# Patient Record
Sex: Male | Born: 1955 | Race: White | Hispanic: No | Marital: Married | Smoking: Never smoker
Health system: Southern US, Community
[De-identification: ages and names within clinical notes are randomized; demographics above are authoritative.]

## PROBLEM LIST (undated history)

## (undated) DIAGNOSIS — I10 Essential (primary) hypertension: Secondary | ICD-10-CM

## (undated) DIAGNOSIS — E785 Hyperlipidemia, unspecified: Secondary | ICD-10-CM

## (undated) DIAGNOSIS — I5032 Chronic diastolic (congestive) heart failure: Secondary | ICD-10-CM

## (undated) DIAGNOSIS — C787 Secondary malignant neoplasm of liver and intrahepatic bile duct: Secondary | ICD-10-CM

## (undated) DIAGNOSIS — C801 Malignant (primary) neoplasm, unspecified: Secondary | ICD-10-CM

## (undated) DIAGNOSIS — E119 Type 2 diabetes mellitus without complications: Secondary | ICD-10-CM

## (undated) HISTORY — DX: Hyperlipidemia, unspecified: E78.5

---

## 2013-06-16 ENCOUNTER — Encounter (HOSPITAL_COMMUNITY): Payer: Self-pay

## 2013-06-16 ENCOUNTER — Inpatient Hospital Stay (HOSPITAL_COMMUNITY)
Admission: EM | Admit: 2013-06-16 | Discharge: 2013-06-22 | DRG: 579 | Disposition: A | Discharge status: Home / Self Care | Payer: 59 | Attending: Internal Medicine | Admitting: Internal Medicine

## 2013-06-16 ENCOUNTER — Emergency Department (HOSPITAL_COMMUNITY): Payer: 59

## 2013-06-16 DIAGNOSIS — A4901 Methicillin susceptible Staphylococcus aureus infection, unspecified site: Secondary | ICD-10-CM | POA: Diagnosis present

## 2013-06-16 DIAGNOSIS — N390 Urinary tract infection, site not specified: Secondary | ICD-10-CM | POA: Diagnosis present

## 2013-06-16 DIAGNOSIS — L03116 Cellulitis of left lower limb: Secondary | ICD-10-CM

## 2013-06-16 DIAGNOSIS — I10 Essential (primary) hypertension: Secondary | ICD-10-CM

## 2013-06-16 DIAGNOSIS — M629 Disorder of muscle, unspecified: Secondary | ICD-10-CM | POA: Diagnosis present

## 2013-06-16 DIAGNOSIS — E871 Hypo-osmolality and hyponatremia: Secondary | ICD-10-CM | POA: Diagnosis present

## 2013-06-16 DIAGNOSIS — E1110 Type 2 diabetes mellitus with ketoacidosis without coma: Secondary | ICD-10-CM

## 2013-06-16 DIAGNOSIS — E1142 Type 2 diabetes mellitus with diabetic polyneuropathy: Secondary | ICD-10-CM | POA: Diagnosis present

## 2013-06-16 DIAGNOSIS — M2420 Disorder of ligament, unspecified site: Secondary | ICD-10-CM | POA: Diagnosis present

## 2013-06-16 DIAGNOSIS — E119 Type 2 diabetes mellitus without complications: Secondary | ICD-10-CM | POA: Diagnosis present

## 2013-06-16 DIAGNOSIS — L03119 Cellulitis of unspecified part of limb: Principal | ICD-10-CM | POA: Diagnosis present

## 2013-06-16 DIAGNOSIS — L02619 Cutaneous abscess of unspecified foot: Principal | ICD-10-CM | POA: Diagnosis present

## 2013-06-16 DIAGNOSIS — E1310 Other specified diabetes mellitus with ketoacidosis without coma: Secondary | ICD-10-CM | POA: Diagnosis present

## 2013-06-16 DIAGNOSIS — M65979 Unspecified synovitis and tenosynovitis, unspecified ankle and foot: Secondary | ICD-10-CM | POA: Diagnosis present

## 2013-06-16 DIAGNOSIS — E1149 Type 2 diabetes mellitus with other diabetic neurological complication: Secondary | ICD-10-CM | POA: Diagnosis present

## 2013-06-16 DIAGNOSIS — M659 Synovitis and tenosynovitis, unspecified: Secondary | ICD-10-CM | POA: Diagnosis present

## 2013-06-16 DIAGNOSIS — E876 Hypokalemia: Secondary | ICD-10-CM | POA: Diagnosis not present

## 2013-06-16 HISTORY — DX: Type 2 diabetes mellitus without complications: E11.9

## 2013-06-16 LAB — COMPREHENSIVE METABOLIC PANEL
ALT: 12 U/L (ref 0–53)
AST: 22 U/L (ref 0–37)
Albumin: 2.9 g/dL — ABNORMAL LOW (ref 3.5–5.2)
CO2: 22 mEq/L (ref 19–32)
Chloride: 92 mEq/L — ABNORMAL LOW (ref 96–112)
Creatinine, Ser: 0.75 mg/dL (ref 0.50–1.35)
GFR calc non Af Amer: >90 mL/min (ref >90)
Sodium: 130 mEq/L — ABNORMAL LOW (ref 135–145)
Total Bilirubin: 1.3 mg/dL — ABNORMAL HIGH (ref 0.3–1.2)

## 2013-06-16 LAB — CBC WITH DIFFERENTIAL/PLATELET
Basophils Absolute: 0 10*3/uL (ref 0.0–0.1)
Basophils Relative: 0 % (ref 0–1)
Lymphocytes Relative: 2 % — ABNORMAL LOW (ref 12–46)
MCHC: 34.7 g/dL (ref 30.0–36.0)
Monocytes Absolute: 1.7 10*3/uL — ABNORMAL HIGH (ref 0.1–1.0)
Neutro Abs: 17.8 10*3/uL — ABNORMAL HIGH (ref 1.7–7.7)
Neutrophils Relative %: 90 % — ABNORMAL HIGH (ref 43–77)
Platelets: 275 10*3/uL (ref 150–400)
RDW: 12.6 % (ref 11.5–15.5)
WBC: 19.8 10*3/uL — ABNORMAL HIGH (ref 4.0–10.5)

## 2013-06-16 LAB — BASIC METABOLIC PANEL
CO2: 19 mEq/L (ref 19–32)
Calcium: 8.9 mg/dL (ref 8.4–10.5)
Glucose, Bld: 263 mg/dL — ABNORMAL HIGH (ref 70–99)
Sodium: 131 mEq/L — ABNORMAL LOW (ref 135–145)

## 2013-06-16 MED ORDER — SODIUM CHLORIDE 0.9 % IV SOLN
INTRAVENOUS | Status: DC
  Administered 2013-06-16: 2 [IU]/h via INTRAVENOUS
  Filled 2013-06-16: qty 1

## 2013-06-16 MED ORDER — SODIUM CHLORIDE 0.9 % IV BOLUS (SEPSIS)
1000.0000 mL | Freq: Once | INTRAVENOUS | Status: AC
  Administered 2013-06-16: 1000 mL via INTRAVENOUS

## 2013-06-16 MED ORDER — CLINDAMYCIN PHOSPHATE 600 MG/50ML IV SOLN
600.0000 mg | Freq: Once | INTRAVENOUS | Status: AC
  Administered 2013-06-16: 600 mg via INTRAVENOUS
  Filled 2013-06-16: qty 50

## 2013-06-16 MED ORDER — SODIUM CHLORIDE 0.9 % IV SOLN
INTRAVENOUS | Status: AC
  Administered 2013-06-16: 22:00:00 via INTRAVENOUS

## 2013-06-16 MED ORDER — VANCOMYCIN HCL 10 G IV SOLR
2500.0000 mg | Freq: Once | INTRAVENOUS | Status: AC
  Administered 2013-06-16: 2500 mg via INTRAVENOUS
  Filled 2013-06-16: qty 2500

## 2013-06-16 MED ORDER — VANCOMYCIN HCL 10 G IV SOLR
1250.0000 mg | Freq: Two times a day (BID) | INTRAVENOUS | Status: DC
  Administered 2013-06-17 – 2013-06-18 (×4): 1250 mg via INTRAVENOUS
  Filled 2013-06-16 (×6): qty 1250

## 2013-06-16 NOTE — H&P (Signed)
[facility] History and Physical  Jaime [name] ZOX:[id number] DOB: [birth year] DOA: 06/16/2013  Referring physician: Dr. Lynelle Doctor PCP: No PCP Per Patient  Specialists: none  Chief Complaint: left foot pain  HPI: Jaime [name] is a 57 y.o. male  With no significant past medical history that comes in for redness and pain of his right foot started 2 days prior to admission. Patient relates he was working 4 days prior to admission felt into a ditch and  twisted his ankle. Pt states that he was wearing a shoe on Saturday, during a 12-hour shift, when swelling onset.  Pt states that he is able to ambulate well. Pt states that resting, elevating the leg and lying supine reduce his pain. Pt states that he has taken Aleve and Aspirin with minimal relief of pain and swelling. Pt states that he has had 2, 10-minute episodes of chills over the last few days.  In the ED: A CBC was done that showed a white count 19.8 with a left shift, c-met was done that showed blood glucose of 302 anion gap of 18 bicarbonate 2200 on 2.92 the rest and further evaluate.  Review of Systems: The patient denies anorexia, fever, weight loss,, vision loss, decreased hearing, hoarseness, chest pain, syncope, dyspnea on exertion, peripheral edema, balance deficits, hemoptysis, abdominal pain, melena, hematochezia, severe indigestion/heartburn, hematuria, incontinence, genital sores, muscle weakness, suspicious skin lesions, transient blindness, difficulty walking, depression, unusual weight change, abnormal bleeding, enlarged lymph nodes, angioedema, and breast masses.    Past Medical History  Diagnosis Date  . Diabetes mellitus without complication    History reviewed. No pertinent past surgical history. Social History:  reports that he has never smoked. He does not have any smokeless tobacco history on file. He reports that  drinks alcohol. He reports that he does not use illicit drugs.   No Known Allergies  Family  History  Problem Relation Age of Onset  . Heart failure Mother   . Heart attack Mother   . Leukemia Father     Prior to Admission medications   Medication Sig Start Date End Date Taking? Authorizing Provider  aspirin EC 81 MG tablet Take 81 mg by mouth daily.   Yes Historical Provider, MD  ibuprofen (ADVIL,MOTRIN) 200 MG tablet Take 400 mg by mouth every 6 (six) hours as needed for pain.   Yes Historical Provider, MD   Physical Exam: Filed Vitals:   06/16/13 1651 06/16/13 2006  BP: 190/88 135/70  Pulse: 118 103  Temp: 99.4 F (37.4 C) 99.7 F (37.6 C)  TempSrc: Oral Oral  Resp: 19 20  SpO2: 99% 98%    BP 135/70  Pulse 103  Temp(Src) 99.7 F (37.6 C) (Oral)  Resp 20  SpO2 98%  General Appearance:    Alert, cooperative, no distress, appears stated age  Head:    Normocephalic, without obvious abnormality, atraumatic           Throat:   Lips, mucosa, and tongue are dry.  Neck:   Supple, symmetrical, trachea midline, no adenopathy;       thyroid:  No enlargement/tenderness/nodules; no carotid   bruit or JVD  Back:     Symmetric, no curvature, ROM normal, no CVA tenderness  Lungs:     Clear to auscultation bilaterally, respirations unlabored  Chest wall:    No tenderness or deformity  Heart:    Regular rate and rhythm, S1 and S2 normal, no murmur, rub   or gallop  Abdomen:  Soft, non-tender, bowel sounds active all four quadrants,    no masses, no organomegaly        Extremities:   left lower extremity edema with erythema up to his ankle. He has an opening in the medial side in the plantar aspect with some observable fluid collection under the skin.   Pulses:   2+ and symmetric all extremities  Skin:   Skin color, texture, turgor normal, no rashes or lesions  Lymph nodes:   Cervical, supraclavicular, and axillary nodes normal  Neurologic:   CNII-XII intact. Normal strength, sensation and reflexes      throughout    Labs on Admission:  Basic Metabolic  Panel:  Recent Labs Lab 06/16/13 1713  NA 130*  K 3.7  CL 92*  CO2 22  GLUCOSE 302*  BUN 18  CREATININE 0.75  CALCIUM 9.6   Liver Function Tests:  Recent Labs Lab 06/16/13 1713  AST 22  ALT 12  ALKPHOS 136*  BILITOT 1.3*  PROT 7.7  ALBUMIN 2.9*   No results found for this basename: LIPASE, AMYLASE,  in the last 168 hours No results found for this basename: AMMONIA,  in the last 168 hours CBC:  Recent Labs Lab 06/16/13 1713  WBC 19.8*  NEUTROABS 17.8*  HGB 11.6*  HCT 33.4*  MCV 83.3  PLT 275   Cardiac Enzymes: No results found for this basename: CKTOTAL, CKMB, CKMBINDEX, TROPONINI,  in the last 168 hours  BNP (last 3 results) No results found for this basename: PROBNP,  in the last 8760 hours CBG: No results found for this basename: GLUCAP,  in the last 168 hours  Radiological Exams on Admission: Dg Foot Complete Left  06/16/2013   *RADIOLOGY REPORT*  Clinical Data: Left foot pain, swelling and redness following an injury 4 days ago.  LEFT FOOT - COMPLETE 3+ VIEW  Comparison: None.  Findings: Inferior and posterior calcaneal spur formation.  Dorsal tarsal spur formation.  Talotibial degenerative spur formation. Mild diffuse distal soft tissue swelling.  No fracture or dislocation.  IMPRESSION:  1.  No fracture. 2.  Degenerative changes, as described above.   Original Report Authenticated By: Beckie Salts, M.D.    EKG: Independently reviewed. none  Assessment/Plan Cellulitis and abscess of foot, except toes - Check blood cultures x2, we'll start him on vancomycin per pharmacy. We'll get a lactate and serum ketone. We'll check a CT scan of the left foot to rule out an abscess. Will elevate leg above heart.  Diabetes mellitus, type II/DKA, type 2 - He has a anion gap of 18, with a bicarbonate of 22. We'll go ahead and start him IV insulin.  Check CBGs q. hours and basic metabolic panel every 4 hours. Once his gap is closed can transition to subcutaneous insulin.  We'll check a serum ketone and lactic acid. Also given 2 L of normal saline as he seems to be intravascularly dry by physical exam. And we'll continue him on normal saline. We'll check strict I.'s and O.'s. We'll replete his potassium. -  Check potassium in the morning  Hyponatremia:  - This most likely secondary to elevated blood glucose we'll repeat in the morning was blood glucoses correct.  Code Status: FULL Family Communication: none Disposition Plan: inpatient 2-3 days  Time spent: 70 minutes  Marinda Elk [facility] Pager [phone]  If 7PM-7AM, please contact night-coverage [email or web address] Password [email or web address] 06/16/2013, 9:05 PM

## 2013-06-16 NOTE — ED Notes (Signed)
Pt c/o Left ankle pain and swelling,  reports he twisted his Left foot Friday night when he fell in a ditch, worked a 12 hour shift Saturday the next day. Redness and swelling noted to Left ankle

## 2013-06-16 NOTE — Progress Notes (Signed)
ANTIBIOTIC CONSULT NOTE - INITIAL  Pharmacy Consult for Vancomycin Indication: right foot cellulitis  No Known Allergies  Patient Measurements: Height: 6\' 4"  (193 cm) Weight: 280 lb (127.007 kg) IBW/kg (Calculated) : 86.8  Vital Signs: Temp: 99.7 F (37.6 C) (06/17 2006) Temp src: Oral (06/17 2006) BP: 135/70 mmHg (06/17 2006) Pulse Rate: 103 (06/17 2006) Intake/Output from previous day:   Intake/Output from this shift:    Labs:  Recent Labs  06/16/13 1713  WBC 19.8*  HGB 11.6*  PLT 275  CREATININE 0.75   Estimated Creatinine Clearance: 146.5 ml/min (by C-G formula based on Cr of 0.75). No results found for this basename: VANCOTROUGH, VANCOPEAK, VANCORANDOM, GENTTROUGH, GENTPEAK, GENTRANDOM, TOBRATROUGH, TOBRAPEAK, TOBRARND, AMIKACINPEAK, AMIKACINTROU, AMIKACIN,  in the last 72 hours   Microbiology: No results found for this or any previous visit (from the past 720 hour(s)).  Medical History: Past Medical History  Diagnosis Date  . Diabetes mellitus without complication     Medications:   (Not in a hospital admission) Assessment: 57 y/o male patient admitted with DKA, found to have right foot cellulitis requiring vancomycin. Received clindamycin in ED. Will adjust abx for obesity.  Goal of Therapy:  Vancomycin trough level 10-15 mcg/ml  Plan:  Vancomycin 2.5g x1 then 1250mg  IV q12h and monitor renal function. Measure antibiotic drug levels at steady [address] PharmD, [place] Pager [phone] 06/16/2013,9:47 PM

## 2013-06-16 NOTE — ED Provider Notes (Signed)
History  This chart was scribed for Jaime Dredge, PA-C, working with Glynn Octave, MD by Ardelia Mems, ED Scribe. This patient was seen in room TR11C/TR11C and the patient's care was started at 5:01 PM.   CSN: [id number]  Arrival date & time 06/16/13  1648     Chief Complaint  Patient presents with  . Ankle Injury    Patient is a 57 y.o. male presenting with lower extremity injury. The history is provided by the patient. No language interpreter was used.  Ankle Injury Pertinent negatives include no chest pain, no abdominal pain and no shortness of breath.  Ankle Injury Associated symptoms include chills. Pertinent negatives include no abdominal pain, chest pain, congestion, coughing, fever, myalgias, nausea, numbness, sore throat, vomiting or weakness.    HPI Comments: Jaime [name] is a 57 y.o. male who presents to the Emergency Department complaining of constant, moderate, "5/10" left foot pain with associated swelling and redness. Pt was seen at [facility] Sunday and was sent here. Pt has a h/o "borderline" diabetes. Pt states that he was working in his yard 4 days ago, fell into a ditch, and believes he twisted his left ankle. Pt denies breaching his skin. Pt states that he was wearing a shoe on Saturday, during a 12-hour shift,  when swelling onset. Pt states that his left ankle has always been bigger, which he attributes to ligament stress in his youth. Pt states that he is able to ambulate well. Pt states that resting, elevating the leg and lying supine reduce his pain. Pt states that he has taken Aleve and Aspirin with minimal relief of pain and swelling. Pt states that he has had 2, 10-minute episodes of chills over the last few days. Pt also states that he removed a tick from his left lower leg about 2 days ago. Pt states that the tick was not embedded. Pt denies right ankle pain/swelling, weakness, nausea, fever, vomiting, body aches, chest pain, SOB, cough,, sore throat, congestion,  abdominal pain or any other symptoms.  PCP- none  Past Medical History  Diagnosis Date  . Diabetes mellitus without complication     History reviewed. No pertinent past surgical history.  History reviewed. No pertinent family history.  History  Substance Use Topics  . Smoking status: Never Smoker   . Smokeless tobacco: Not on file  . Alcohol Use: Yes     Comment: social drinker      Review of Systems  Constitutional: Positive for chills. Negative for fever, activity change and appetite change.  HENT: Negative for congestion and sore throat.   Respiratory: Negative for cough and shortness of breath.   Cardiovascular: Negative for chest pain.  Gastrointestinal: Negative for nausea, vomiting and abdominal pain.  Musculoskeletal: Negative for myalgias.       Left ankle- pain, swelling and redness  Skin: Positive for color change. Negative for wound.  Neurological: Negative for weakness and numbness.  All other systems reviewed and are negative.   As per HPI  Allergies  Review of patient's allergies indicates no known allergies.  Home Medications  No current outpatient prescriptions on file.  Triage Vitals: BP 190/88  Pulse 118  Temp(Src) 99.4 F (37.4 C) (Oral)  Resp 19  SpO2 99%  Physical Exam  Nursing note and vitals reviewed. Constitutional: He appears well-developed and well-nourished. No distress.  HENT:  Head: Normocephalic and atraumatic.  Neck: Neck supple.  Cardiovascular: Normal rate and regular rhythm.   Pulmonary/Chest: Effort normal and breath sounds normal.  No respiratory distress. He has no wheezes. He has no rales.  Musculoskeletal:  Left foot with large areas of erythema over dorsal and plantar aspect and bullous appearance to plantar aspect of left foot.  Pitting edema of foot and lower leg, extending to to level of the knee.    Neurological: He is alert.  Skin: He is not diaphoretic.    ED Course  Procedures (including critical care  time)  DIAGNOSTIC STUDIES: Oxygen Saturation is 99% on RA, normal by my interpretation.    COORDINATION OF CARE: 5:17 PM- Pt advised of plan for treatment and pt agrees.   Medications  sodium chloride 0.9 % bolus 1,000 mL (not administered)  clindamycin (CLEOCIN) IVPB 600 mg (0 mg Intravenous Stopped 06/16/13 1905)     Labs Reviewed  CBC WITH DIFFERENTIAL - Abnormal; Notable for the following:    WBC 19.8 (*)    RBC 4.01 (*)    Hemoglobin 11.6 (*)    HCT 33.4 (*)    Neutrophils Relative % 90 (*)    Neutro Abs 17.8 (*)    Lymphocytes Relative 2 (*)    Lymphs Abs 0.4 (*)    Monocytes Absolute 1.7 (*)    All other components within normal limits  COMPREHENSIVE METABOLIC PANEL - Abnormal; Notable for the following:    Sodium 130 (*)    Chloride 92 (*)    Glucose, Bld 302 (*)    Albumin 2.9 (*)    Alkaline Phosphatase 136 (*)    Total Bilirubin 1.3 (*)    All other components within normal limits   Dg Foot Complete Left  06/16/2013   *RADIOLOGY REPORT*  Clinical Data: Left foot pain, swelling and redness following an injury 4 days ago.  LEFT FOOT - COMPLETE 3+ VIEW  Comparison: None.  Findings: Inferior and posterior calcaneal spur formation.  Dorsal tarsal spur formation.  Talotibial degenerative spur formation. Mild diffuse distal soft tissue swelling.  No fracture or dislocation.  IMPRESSION:  1.  No fracture. 2.  Degenerative changes, as described above.   Original Report Authenticated By: Beckie Salts, M.D.   Anion gap is 16.    1. Cellulitis of left foot   2. Diabetes mellitus    MDM  Pt with no PCP and previously thought to be "borderline" diabetes p/w blood glucose >300 with cellulitis of left foot.  Pt given clindamycin in ED.  Started on glucomander per my discussion with hospitalist.  Admitted [facility].       I personally performed the services described in this documentation, which was scribed in my presence. The recorded information has been reviewed and is  accurate.    Jaime Dredge, PA-C 06/16/13 2102

## 2013-06-16 NOTE — ED Notes (Signed)
No new changes from triage assessment 

## 2013-06-17 ENCOUNTER — Inpatient Hospital Stay (HOSPITAL_COMMUNITY): Payer: 59

## 2013-06-17 ENCOUNTER — Encounter (HOSPITAL_COMMUNITY): Payer: Self-pay | Admitting: *Deleted

## 2013-06-17 DIAGNOSIS — E876 Hypokalemia: Secondary | ICD-10-CM

## 2013-06-17 LAB — URINALYSIS, ROUTINE W REFLEX MICROSCOPIC
Glucose, UA: >1000 mg/dL — AB
Ketones, ur: 40 mg/dL — AB
Specific Gravity, Urine: 1.031 — ABNORMAL HIGH (ref 1.005–1.030)
pH: 5.5 (ref 5.0–8.0)

## 2013-06-17 LAB — PROTIME-INR
INR: 1.23 (ref 0.00–1.49)
Prothrombin Time: 15.3 seconds — ABNORMAL HIGH (ref 11.6–15.2)

## 2013-06-17 LAB — URINE MICROSCOPIC-ADD ON

## 2013-06-17 LAB — GLUCOSE, CAPILLARY
Glucose-Capillary: 183 mg/dL — ABNORMAL HIGH (ref 70–99)
Glucose-Capillary: 149 mg/dL — ABNORMAL HIGH (ref 70–99)
Glucose-Capillary: 170 mg/dL — ABNORMAL HIGH (ref 70–99)
Glucose-Capillary: 165 mg/dL — ABNORMAL HIGH (ref 70–99)
Glucose-Capillary: 117 mg/dL — ABNORMAL HIGH (ref 70–99)
Glucose-Capillary: 118 mg/dL — ABNORMAL HIGH (ref 70–99)
Glucose-Capillary: 94 mg/dL (ref 70–99)

## 2013-06-17 LAB — BASIC METABOLIC PANEL
BUN: 17 mg/dL (ref 6–23)
BUN: 17 mg/dL (ref 6–23)
CO2: 25 mEq/L (ref 19–32)
Chloride: 96 mEq/L (ref 96–112)
Chloride: 99 mEq/L (ref 96–112)
Creatinine, Ser: 0.65 mg/dL (ref 0.50–1.35)
GFR calc non Af Amer: >90 mL/min (ref >90)
Glucose, Bld: 246 mg/dL — ABNORMAL HIGH (ref 70–99)
Potassium: 3.4 mEq/L — ABNORMAL LOW (ref 3.5–5.1)

## 2013-06-17 LAB — COMPREHENSIVE METABOLIC PANEL
ALT: 8 U/L (ref 0–53)
Alkaline Phosphatase: 118 U/L — ABNORMAL HIGH (ref 39–117)
CO2: 25 mEq/L (ref 19–32)
Calcium: 8.9 mg/dL (ref 8.4–10.5)
Chloride: 97 mEq/L (ref 96–112)
GFR calc Af Amer: >90 mL/min (ref >90)
GFR calc non Af Amer: >90 mL/min (ref >90)
Glucose, Bld: 209 mg/dL — ABNORMAL HIGH (ref 70–99)
Potassium: 3.3 mEq/L — ABNORMAL LOW (ref 3.5–5.1)
Sodium: 132 mEq/L — ABNORMAL LOW (ref 135–145)
Total Bilirubin: 1 mg/dL (ref 0.3–1.2)

## 2013-06-17 LAB — CBC
MCHC: 35.1 g/dL (ref 30.0–36.0)
RDW: 12.6 % (ref 11.5–15.5)

## 2013-06-17 MED ORDER — DEXTROSE-NACL 5-0.45 % IV SOLN
INTRAVENOUS | Status: DC
  Administered 2013-06-17 (×2): via INTRAVENOUS

## 2013-06-17 MED ORDER — HYDROMORPHONE HCL PF 1 MG/ML IJ SOLN: 1.0000 mg | INTRAMUSCULAR | Status: DC | PRN

## 2013-06-17 MED ORDER — ACETAMINOPHEN 650 MG RE SUPP: 650.0000 mg | Freq: Four times a day (QID) | RECTAL | Status: DC | PRN

## 2013-06-17 MED ORDER — POLYETHYLENE GLYCOL 3350 17 G PO PACK
17.0000 g | PACK | Freq: Every day | ORAL | Status: DC | PRN
  Filled 2013-06-17: qty 1

## 2013-06-17 MED ORDER — LIVING WELL WITH DIABETES BOOK
Freq: Once | Status: AC
  Administered 2013-06-17: 13:00:00
  Filled 2013-06-17: qty 1

## 2013-06-17 MED ORDER — INSULIN DETEMIR 100 UNIT/ML ~~LOC~~ SOLN
30.0000 [IU] | Freq: Every day | SUBCUTANEOUS | Status: DC
  Administered 2013-06-17 – 2013-06-22 (×6): 30 [IU] via SUBCUTANEOUS
  Filled 2013-06-17 (×9): qty 0.3

## 2013-06-17 MED ORDER — ENOXAPARIN SODIUM 60 MG/0.6ML ~~LOC~~ SOLN
60.0000 mg | SUBCUTANEOUS | Status: DC
  Administered 2013-06-17 – 2013-06-21 (×4): 60 mg via SUBCUTANEOUS
  Filled 2013-06-17 (×6): qty 0.6

## 2013-06-17 MED ORDER — POTASSIUM CHLORIDE 10 MEQ/100ML IV SOLN
10.0000 meq | INTRAVENOUS | Status: AC
  Administered 2013-06-17 (×2): 10 meq via INTRAVENOUS
  Filled 2013-06-17 (×2): qty 100

## 2013-06-17 MED ORDER — ONDANSETRON HCL 4 MG PO TABS: 4.0000 mg | ORAL_TABLET | Freq: Four times a day (QID) | ORAL | Status: DC | PRN

## 2013-06-17 MED ORDER — ACETAMINOPHEN 325 MG PO TABS: 650.0000 mg | ORAL_TABLET | Freq: Four times a day (QID) | ORAL | Status: DC | PRN

## 2013-06-17 MED ORDER — INSULIN ASPART 100 UNIT/ML ~~LOC~~ SOLN
6.0000 [IU] | Freq: Three times a day (TID) | SUBCUTANEOUS | Status: DC
  Administered 2013-06-17: 6 [IU] via SUBCUTANEOUS

## 2013-06-17 MED ORDER — PNEUMOCOCCAL VAC POLYVALENT 25 MCG/0.5ML IJ INJ
0.5000 mL | INJECTION | INTRAMUSCULAR | Status: AC
  Filled 2013-06-17: qty 0.5

## 2013-06-17 MED ORDER — ONDANSETRON HCL 4 MG/2ML IJ SOLN: 4.0000 mg | Freq: Three times a day (TID) | INTRAMUSCULAR | Status: DC | PRN

## 2013-06-17 MED ORDER — PIPERACILLIN-TAZOBACTAM 3.375 G IVPB
3.3750 g | Freq: Three times a day (TID) | INTRAVENOUS | Status: DC
  Administered 2013-06-17 – 2013-06-21 (×13): 3.375 g via INTRAVENOUS
  Filled 2013-06-17 (×14): qty 50

## 2013-06-17 MED ORDER — HEPARIN SODIUM (PORCINE) 5000 UNIT/ML IJ SOLN: 5000.0000 [IU] | Freq: Three times a day (TID) | INTRAMUSCULAR | Status: DC

## 2013-06-17 MED ORDER — SODIUM CHLORIDE 0.9 % IV SOLN: INTRAVENOUS | Status: DC

## 2013-06-17 MED ORDER — INSULIN REGULAR HUMAN 100 UNIT/ML IJ SOLN
INTRAMUSCULAR | Status: DC
  Administered 2013-06-17: 4.9 [IU]/h via INTRAVENOUS

## 2013-06-17 MED ORDER — INSULIN ASPART 100 UNIT/ML ~~LOC~~ SOLN
10.0000 [IU] | Freq: Three times a day (TID) | SUBCUTANEOUS | Status: DC
  Administered 2013-06-17: 10 [IU] via SUBCUTANEOUS

## 2013-06-17 MED ORDER — SODIUM CHLORIDE 0.9 % IV SOLN
INTRAVENOUS | Status: DC
  Administered 2013-06-17: via INTRAVENOUS

## 2013-06-17 MED ORDER — GADOBENATE DIMEGLUMINE 529 MG/ML IV SOLN
20.0000 mL | Freq: Once | INTRAVENOUS | Status: AC
  Administered 2013-06-17: 20 mL via INTRAVENOUS

## 2013-06-17 MED ORDER — HEPARIN SODIUM (PORCINE) 5000 UNIT/ML IJ SOLN
5000.0000 [IU] | Freq: Three times a day (TID) | INTRAMUSCULAR | Status: DC
  Filled 2013-06-17 (×3): qty 1

## 2013-06-17 MED ORDER — ONDANSETRON HCL 4 MG/2ML IJ SOLN: 4.0000 mg | Freq: Four times a day (QID) | INTRAMUSCULAR | Status: DC | PRN

## 2013-06-17 MED ORDER — INSULIN ASPART 100 UNIT/ML ~~LOC~~ SOLN
0.0000 [IU] | Freq: Three times a day (TID) | SUBCUTANEOUS | Status: DC
  Administered 2013-06-18: 3 [IU] via SUBCUTANEOUS
  Administered 2013-06-18 (×2): 2 [IU] via SUBCUTANEOUS
  Administered 2013-06-19: 1 [IU] via SUBCUTANEOUS
  Administered 2013-06-19 – 2013-06-20 (×2): 2 [IU] via SUBCUTANEOUS
  Administered 2013-06-20: 1 [IU] via SUBCUTANEOUS
  Administered 2013-06-20 – 2013-06-21 (×2): 2 [IU] via SUBCUTANEOUS
  Administered 2013-06-21: 1 [IU] via SUBCUTANEOUS
  Administered 2013-06-21 – 2013-06-22 (×2): 2 [IU] via SUBCUTANEOUS
  Administered 2013-06-22: 13:00:00 via SUBCUTANEOUS

## 2013-06-17 MED ORDER — POTASSIUM CHLORIDE CRYS ER 20 MEQ PO TBCR
40.0000 meq | EXTENDED_RELEASE_TABLET | Freq: Once | ORAL | Status: AC
  Administered 2013-06-17: 40 meq via ORAL
  Filled 2013-06-17: qty 2

## 2013-06-17 MED ORDER — ASPIRIN EC 81 MG PO TBEC
81.0000 mg | DELAYED_RELEASE_TABLET | Freq: Every day | ORAL | Status: DC
  Administered 2013-06-17 – 2013-06-22 (×5): 81 mg via ORAL
  Filled 2013-06-17 (×6): qty 1

## 2013-06-17 MED ORDER — DEXTROSE 50 % IV SOLN: 25.0000 mL | INTRAVENOUS | Status: DC | PRN

## 2013-06-17 NOTE — Progress Notes (Signed)
PATIENT DETAILS Name: Jaime [name] Age: 57 y.o. Sex: male Date of Birth: [birth year] Admit Date: 06/16/2013 Admitting Physician Marinda Elk, MD PCP:No PCP Per Patient  Subjective: No major issues overnight-admitted with DKA and significant left foot cellultis/swelling  Assessment/Plan: Principal Problem:   Cellulitis of Left foot -suspect deep tissue infection-therefore will add Zosyn -check MRI  -have consulted Dr Lajoyce Corners for possible I&D  DKA -resolved -given 30 units of Levemir and then stop Insulin gtt -Will also place on 10 units of Novolog with meals along with SSI -await A1C  Hypokalemia -2/2 to DKA/Insulin gtt -replete and recheck in am  Disposition: Remain inpatient  DVT Prophylaxis: Prophylactic Lovenox   Code Status: Full code   Family Communication None  Procedures:  None  CONSULTS:  orthopedic surgery   MEDICATIONS: Scheduled Meds: . aspirin EC  81 mg Oral Daily  . heparin  5,000 Units Subcutaneous Q8H  . insulin aspart  0-9 Units Subcutaneous TID WC  . insulin aspart  10 Units Subcutaneous TID WC  . insulin detemir  30 Units Subcutaneous Daily  . piperacillin-tazobactam (ZOSYN)  IV  3.375 g Intravenous Q8H  . [START ON 06/18/2013] pneumococcal 23 valent vaccine  0.5 mL Intramuscular Tomorrow-1000  . potassium chloride  10 mEq Intravenous Q1 Hr x 2  . vancomycin  1,250 mg Intravenous Q12H   Continuous Infusions: . sodium chloride 125 mL/hr at 06/17/13 0023  . sodium chloride    . dextrose 5 % and 0.45% NaCl 100 mL/hr at 06/17/13 0225  . insulin (NOVOLIN-R) infusion     PRN Meds:.acetaminophen, acetaminophen, dextrose, ondansetron (ZOFRAN) IV, ondansetron, polyethylene glycol  Antibiotics: Anti-infectives   Start     Dose/Rate Route Frequency Ordered Stop   06/17/13 1000  vancomycin (VANCOCIN) 1,250 mg in sodium chloride 0.9 % 250 mL IVPB     1,250 mg 166.7 mL/hr over 90 Minutes Intravenous Every 12 hours 06/16/13 2152      06/17/13 0900  piperacillin-tazobactam (ZOSYN) IVPB 3.375 g     3.375 g 12.5 mL/hr over 240 Minutes Intravenous Every 8 hours 06/17/13 0820     06/16/13 2300  vancomycin (VANCOCIN) 2,500 mg in sodium chloride 0.9 % 500 mL IVPB     2,500 mg 250 mL/hr over 120 Minutes Intravenous  Once 06/16/13 2152 06/17/13 0128   06/16/13 1815  clindamycin (CLEOCIN) IVPB 600 mg     600 mg 100 mL/hr over 30 Minutes Intravenous  Once 06/16/13 1802 06/16/13 1905       PHYSICAL EXAM: Vital signs in last 24 hours: Filed Vitals:   06/16/13 2136 06/16/13 2330 06/17/13 0401 06/17/13 0913  BP:  143/71 123/70 137/76  Pulse:  100 88 97  Temp:  99.6 F (37.6 C) 100.2 F (37.9 C) 99.5 F (37.5 C)  TempSrc:  Oral Oral Oral  Resp:  18 20 20   Height: 6\' 4"  (1.93 m)     Weight: 127.007 kg (280 lb)  127 kg (279 lb 15.8 oz)   SpO2:  100% 96% 95%    Weight change:  Filed Weights   06/16/13 2136 06/17/13 0401  Weight: 127.007 kg (280 lb) 127 kg (279 lb 15.8 oz)   Body mass index is 34.09 kg/(m^2).   Gen Exam: Awake and alert with clear speech.   Neck: Supple, No JVD.   Chest: B/L Clear.   CVS: S1 S2 Regular, no murmurs.  Abdomen: soft, BS +, non tender, non distended.  Extremities: Significantly swollen left foot-with dorsal erythema and some  blister/discoloration in the plantar aspect as well. Left lower leg also swollen. Neurologic: Non Focal.   Skin: No Rash.   Wounds: N/A.   Intake/Output from previous day:  Intake/Output Summary (Last 24 hours) at 06/17/13 1041 Last data filed at 06/17/13 0900  Gross per 24 hour  Intake    650 ml  Output    200 ml  Net    450 ml     LAB RESULTS: CBC  Recent Labs Lab 06/16/13 1713 06/17/13 0230  WBC 19.8* 15.2*  HGB 11.6* 10.4*  HCT 33.4* 29.6*  PLT 275 219  MCV 83.3 83.1  MCH 28.9 29.2  MCHC 34.7 35.1  RDW 12.6 12.6  LYMPHSABS 0.4*  --   MONOABS 1.7*  --   EOSABS 0.0  --   BASOSABS 0.0  --     Chemistries   Recent Labs Lab  06/16/13 1713 06/16/13 2254 06/17/13 0021 06/17/13 0230 06/17/13 0450  NA 130* 131* 133* 132* 133*  K 3.7 3.5 3.4* 3.3* 2.8*  CL 92* 95* 96 97 99  CO2 22 19 23 25 25   GLUCOSE 302* 263* 246* 209* 166*  BUN 18 17 17 17 17   CREATININE 0.75 0.71 0.73 0.76 0.65  CALCIUM 9.6 8.9 9.2 8.9 8.7    CBG:  Recent Labs Lab 06/17/13 0552 06/17/13 0658 06/17/13 0802 06/17/13 0907 06/17/13 1016  GLUCAP 164* 143* 149* 170* 165*    GFR Estimated Creatinine Clearance: 146.5 ml/min (by C-G formula based on Cr of 0.65).  Coagulation profile  Recent Labs Lab 06/17/13 0230  INR 1.23    Cardiac Enzymes No results found for this basename: CK, CKMB, TROPONINI, MYOGLOBIN,  in the last 168 hours  No components found with this basename: POCBNP,  No results found for this basename: DDIMER,  in the last 72 hours No results found for this basename: HGBA1C,  in the last 72 hours No results found for this basename: CHOL, HDL, LDLCALC, TRIG, CHOLHDL, LDLDIRECT,  in the last 72 hours No results found for this basename: TSH, T4TOTAL, FREET3, T3FREE, THYROIDAB,  in the last 72 hours No results found for this basename: VITAMINB12, FOLATE, FERRITIN, TIBC, IRON, RETICCTPCT,  in the last 72 hours No results found for this basename: LIPASE, AMYLASE,  in the last 72 hours  Urine Studies No results found for this basename: UACOL, UAPR, USPG, UPH, UTP, UGL, UKET, UBIL, UHGB, UNIT, UROB, ULEU, UEPI, UWBC, URBC, UBAC, CAST, CRYS, UCOM, BILUA,  in the last 72 hours  MICROBIOLOGY: No results found for this or any previous visit (from the past 240 hour(s)).  RADIOLOGY STUDIES/RESULTS: Dg Foot Complete Left  06/16/2013   *RADIOLOGY REPORT*  Clinical Data: Left foot pain, swelling and redness following an injury 4 days ago.  LEFT FOOT - COMPLETE 3+ VIEW  Comparison: None.  Findings: Inferior and posterior calcaneal spur formation.  Dorsal tarsal spur formation.  Talotibial degenerative spur formation. Mild  diffuse distal soft tissue swelling.  No fracture or dislocation.  IMPRESSION:  1.  No fracture. 2.  Degenerative changes, as described above.   Original Report Authenticated By: Beckie Salts, M.D.    Jeoffrey Massed, MD  [facility] Pager:[phone]  If 7PM-7AM, please contact night-coverage [email or web address] Password [email or web address] 06/17/2013, 10:41 AM   LOS: 1 day

## 2013-06-17 NOTE — Progress Notes (Signed)
ANTIBIOTIC CONSULT NOTE - FOLLOW UP  Pharmacy Consult for Vancomycin and Zosyn; Lovenox  Indication: diabetic foot infection;  VTE prophylaxis (BMI >30)  No Known Allergies  Patient Measurements: Height: 6\' 4"  (193 cm) Weight: 279 lb 15.8 oz (127 kg) IBW/kg (Calculated) : 86.8  Vital Signs: Temp: 99.5 F (37.5 C) (06/18 0913) Temp src: Oral (06/18 0913) BP: 137/76 mmHg (06/18 0913) Pulse Rate: 97 (06/18 0913) Intake/Output from previous day: 06/17 0701 - 06/18 0700 In: -  Out: 200 [Urine:200] Intake/Output from this shift: Total I/O In: 650 [P.O.:650] Out: -   Labs:  Recent Labs  06/16/13 1713  06/17/13 0021 06/17/13 0230 06/17/13 0450  WBC 19.8*  --   --  15.2*  --   HGB 11.6*  --   --  10.4*  --   PLT 275  --   --  219  --   CREATININE 0.75  < > 0.73 0.76 0.65  < > = values in this interval not displayed. Estimated Creatinine Clearance: 146.5 ml/min (by C-G formula based on Cr of 0.65).  Microbiology:   6/18 - urine culture -   6/17 - blood cultures x 2  Assessment:   Vancomycin begun last night; Zosyn added today.   Received Clindamycin 600 mg IV x 1 on 6/17 pm.   Tmax 100.2, WBC 15.2.  Dr. Lajoyce Corners to evaluate for possible I&D.      Heparin SQ to change to Lovenox SQ for VTE prophylaxis.  No heparin given yet.  Goal of Therapy:  appropriate Zosyn dose for renal function & infection VTE prophylaxis dose of Lovenox  Plan:   Zosyn 3.375 grams IV q8hrs (each infused over 4 hours).  Continue Vancomycin 1250 mg IV q12hrs.  Will follow renal function, culture data, and Ortho input.   Lovenox 60 mg SQ q24hrs (~ 0.5 mg/kg/day).  CBC at least q72hrs while on Lovenox.  Dennie Fetters, RPh Pager: [phone] 06/17/2013,10:51 AM

## 2013-06-17 NOTE — Progress Notes (Addendum)
Inpatient Diabetes Program Recommendations  AACE/ADA: New Consensus Statement on Inpatient Glycemic Control (2013)  Target Ranges:  Prepandial:   less than 140 mg/dL      Peak postprandial:   less than 180 mg/dL (1-2 hours)      Critically ill patients:  140 - 180 mg/dL   History of "borderline DM"  Inpatient Diabetes Program Recommendations HgbA1C: noted intention to order, but cannot find order for HgbA1C in orders. Diet: ordered Dietician consult, pt education per network videosl. Pt would benefit from OP education at [facility] Will await A1C results, however it will be falsely elevated due to hyperglycemia in the past 2 weeks. Will initiate insulin administration education per bedside RN and insulin starter kit once it is certain that pt will start out on insulin for home regimen. Thank you, Lenor Coffin, RN, CNS, Diabetes Coordinator [phone])

## 2013-06-17 NOTE — Evaluation (Signed)
Received call from nurse about pt with dark colored urine. Non bloody. First urination since admission from yesterday. Noted to be admitted for cellulitis and DKA. No dysuria/urinary sxs. Color likely 2/2 dehydration. On IVF. Will send off UA. Will follow. Otherwise continue medical course.

## 2013-06-17 NOTE — Care Management Note (Unsigned)
    Page 1 of 1   06/17/2013     10:49:30 AM   CARE MANAGEMENT NOTE 06/17/2013  Patient:  Jaime [name], Jaime [name]   Account Number:  [id number]  Date Initiated:  06/17/2013  Documentation initiated by:  Letha Cape  Subjective/Objective Assessment:   dx cellulitia/abscess  admit- lives with spouse.  pta indep.     Action/Plan:   Anticipated DC Date:  06/21/2013   Anticipated DC Plan:  HOME W HOME HEALTH SERVICES      DC Planning Services  CM consult      Choice offered to / List presented to:             Status of service:  In process, will continue to follow Medicare Important Message given?   (If response is "NO", the following Medicare IM given date fields will be blank) Date Medicare IM given:   Date Additional Medicare IM given:    Discharge Disposition:    Per UR Regulation:  Reviewed for med. necessity/level of care/duration of stay  If discussed at Long Length of Stay Meetings, dates discussed:    Comments:  06/17/13 10:48 Letha Cape RN, BSN [phone] patient lives with spouse, pta indep, patient has medication coverage and transportation at dc.  NCM will continue to follow for dc needs.

## 2013-06-17 NOTE — ED Provider Notes (Signed)
Medical screening examination/treatment/procedure(s) were conducted as a shared visit with non-physician practitioner(s) and myself.  I personally evaluated the patient during the encounter  Cellulitis of L foot, twisting injury to ankle 4 days ago without open wounds. Hyperglycemia with probable early DKA. Anion gap 18.  Insulin gtt and antibiotics.  Glynn Octave, MD 06/17/13 1144

## 2013-06-17 NOTE — Plan of Care (Signed)
Problem: Food- and Nutrition-Related Knowledge Deficit (NB-1.1) Goal: Nutrition education Formal process to instruct or train a patient/client in a skill or to impart knowledge to help patients/clients voluntarily manage or modify food choices and eating behavior to maintain or improve health. Outcome: Completed/Met Date Met:  06/17/13  RD consulted for nutrition education regarding diabetes.     No results found for this basename: HGBA1C    RD provided "Carbohydrate Counting for People with Diabetes" handout from the Academy of Nutrition and Dietetics. Discussed different food groups and their effects on blood sugar, emphasizing carbohydrate-containing foods. Provided list of carbohydrates and recommended serving sizes of common foods.  Discussed importance of controlled and consistent carbohydrate intake throughout the day. Provided examples of ways to balance meals/snacks and encouraged intake of high-fiber, whole grain complex carbohydrates. Teach back method used.  Expect fair to good compliance.  Body mass index is 34.09 kg/(m^2). Pt meets criteria for obesity class 1 based on current BMI.  Current diet order is Carb Mod Medium, patient is consuming approximately 50% of meals at this time. Labs and medications reviewed. No further nutrition interventions warranted at this time. RD contact information provided. If additional nutrition issues arise, please re-consult RD.  Clarene Duke RD, LDN Pager [phone] After Hours pager [phone]

## 2013-06-17 NOTE — Progress Notes (Addendum)
Inpatient Diabetes Program Recommendations  AACE/ADA: New Consensus Statement on Inpatient Glycemic Control (2013)  Target Ranges:  Prepandial:   less than 140 mg/dL      Peak postprandial:   less than 180 mg/dL (1-2 hours)      Critically ill patients:  140 - 180 mg/dL   Spoke briefly with patient and his wife.  They are extremely tired after not sleeping all night.  However, I did take them some teaching materials. Researched what his insurance [facility]) would cover as far as a glucose meter, and gave them that information. The One Touch Ultra is the meter approved (may be any type One Touch meter).  Encouraged them to use [facility] and DME carrier as they will file his insurance and get him set up. He is responding to the insulin given extremely well. It may be that this pt may not necessarily have to be discharged on insulin, although I told them that the basal insulin may be needed at dishcharge (if only for a little while). Explained that basal insulin may be of benefit to make less demands of the body to produce insulin for meals.  Pt open to that if necessary.However, pt could be started on Metformin(ER-easier on intestinal track) and Tradjenta and/or Amaryl 4 mg per day and  follow up with PCP. (rather than insulin) If insulin is ordered for discharge, please order insulin administration education per bedside RN's with insulin starter kit. Pt could use an insulin pen if preferred. Thank you, Lenor Coffin, RN, CNS, Diabetes Coordinator [phone])

## 2013-06-17 NOTE — Progress Notes (Signed)
Pt telemetry strip for 1941 was accelerated junctional w/ a HR of 100. Previously NSR on tele. On call MD notified.

## 2013-06-18 DIAGNOSIS — M7989 Other specified soft tissue disorders: Secondary | ICD-10-CM

## 2013-06-18 LAB — COMPREHENSIVE METABOLIC PANEL
AST: 26 U/L (ref 0–37)
Albumin: 1.9 g/dL — ABNORMAL LOW (ref 3.5–5.2)
Alkaline Phosphatase: 274 U/L — ABNORMAL HIGH (ref 39–117)
BUN: 16 mg/dL (ref 6–23)
Chloride: 97 mEq/L (ref 96–112)
Potassium: 3.2 mEq/L — ABNORMAL LOW (ref 3.5–5.1)
Sodium: 131 mEq/L — ABNORMAL LOW (ref 135–145)
Total Protein: 6.3 g/dL (ref 6.0–8.3)

## 2013-06-18 LAB — URINE CULTURE

## 2013-06-18 LAB — CBC
HCT: 30 % — ABNORMAL LOW (ref 39.0–52.0)
MCHC: 33.7 g/dL (ref 30.0–36.0)
Platelets: 242 10*3/uL (ref 150–400)
RDW: 12.8 % (ref 11.5–15.5)
WBC: 14.8 10*3/uL — ABNORMAL HIGH (ref 4.0–10.5)

## 2013-06-18 LAB — GLUCOSE, CAPILLARY
Glucose-Capillary: 128 mg/dL — ABNORMAL HIGH (ref 70–99)
Glucose-Capillary: 229 mg/dL — ABNORMAL HIGH (ref 70–99)
Glucose-Capillary: 249 mg/dL — ABNORMAL HIGH (ref 70–99)
Glucose-Capillary: 263 mg/dL — ABNORMAL HIGH (ref 70–99)

## 2013-06-18 LAB — DIFFERENTIAL
Eosinophils Absolute: 0 10*3/uL (ref 0.0–0.7)
Lymphs Abs: 0.7 10*3/uL (ref 0.7–4.0)
Monocytes Absolute: 1.5 10*3/uL — ABNORMAL HIGH (ref 0.1–1.0)
Monocytes Relative: 10 % (ref 3–12)
Neutrophils Relative %: 85 % — ABNORMAL HIGH (ref 43–77)

## 2013-06-18 MED ORDER — SILVER SULFADIAZINE 1 % EX CREA
TOPICAL_CREAM | Freq: Every day | CUTANEOUS | Status: DC
  Administered 2013-06-18: 10:00:00 via TOPICAL
  Filled 2013-06-18: qty 85

## 2013-06-18 MED ORDER — POTASSIUM CHLORIDE CRYS ER 20 MEQ PO TBCR
40.0000 meq | EXTENDED_RELEASE_TABLET | Freq: Once | ORAL | Status: AC
  Administered 2013-06-18: 40 meq via ORAL
  Filled 2013-06-18: qty 2

## 2013-06-18 NOTE — Consult Note (Signed)
Reason for Consult: Acute redness swelling cellulitis left foot with blistering. Referring Physician: Dr Lillia Corporal is an 57 y.o. male.  HPI: Patient is a 57 year old gentleman with type 2 diabetes who presents with acute redness swelling cellulitis and blistering toes left foot. Patient states he feels like he may have sprained his foot or ankle but does not remember any type of penetrating trauma.  Past Medical History  Diagnosis Date  . Diabetes mellitus without complication     History reviewed. No pertinent past surgical history.  Family History  Problem Relation Age of Onset  . Heart failure Mother   . Heart attack Mother   . Leukemia Father     Social History:  reports that he has never smoked. He does not have any smokeless tobacco history on file. He reports that  drinks alcohol. He reports that he does not use illicit drugs.  Allergies: No Known Allergies  Medications: I have reviewed the patient's current medications.  Results for orders placed during the hospital encounter of 06/16/13 (from the past 48 hour(s))  CBC WITH DIFFERENTIAL     Status: Abnormal   Collection Time    06/16/13  5:13 PM      Result Value Range   WBC 19.8 (*) 4.0 - 10.5 K/uL   RBC 4.01 (*) 4.22 - 5.81 MIL/uL   Hemoglobin 11.6 (*) 13.0 - 17.0 g/dL   HCT 16.1 (*) 09.6 - 04.5 %   MCV 83.3  78.0 - 100.0 fL   MCH 28.9  26.0 - 34.0 pg   MCHC 34.7  30.0 - 36.0 g/dL   RDW 40.9  81.1 - 91.4 %   Platelets 275  150 - 400 K/uL   Neutrophils Relative % 90 (*) 43 - 77 %   Neutro Abs 17.8 (*) 1.7 - 7.7 K/uL   Lymphocytes Relative 2 (*) 12 - 46 %   Lymphs Abs 0.4 (*) 0.7 - 4.0 K/uL   Monocytes Relative 8  3 - 12 %   Monocytes Absolute 1.7 (*) 0.1 - 1.0 K/uL   Eosinophils Relative 0  0 - 5 %   Eosinophils Absolute 0.0  0.0 - 0.7 K/uL   Basophils Relative 0  0 - 1 %   Basophils Absolute 0.0  0.0 - 0.1 K/uL  COMPREHENSIVE METABOLIC PANEL     Status: Abnormal   Collection Time   06/16/13  5:13 PM      Result Value Range   Sodium 130 (*) 135 - 145 mEq/L   Potassium 3.7  3.5 - 5.1 mEq/L   Chloride 92 (*) 96 - 112 mEq/L   CO2 22  19 - 32 mEq/L   Glucose, Bld 302 (*) 70 - 99 mg/dL   BUN 18  6 - 23 mg/dL   Creatinine, Ser 7.82  0.50 - 1.35 mg/dL   Calcium 9.6  8.4 - 95.6 mg/dL   Total Protein 7.7  6.0 - 8.3 g/dL   Albumin 2.9 (*) 3.5 - 5.2 g/dL   AST 22  0 - 37 U/L   ALT 12  0 - 53 U/L   Alkaline Phosphatase 136 (*) 39 - 117 U/L   Total Bilirubin 1.3 (*) 0.3 - 1.2 mg/dL   GFR calc non Af Amer >90  >90 mL/min   GFR calc Af Amer >90  >90 mL/min   Comment:            The eGFR has been calculated     using  the CKD EPI equation.     This calculation has not been     validated in all clinical     situations.     eGFR's persistently     <90 mL/min signify     possible Chronic Kidney Disease.  GLUCOSE, CAPILLARY     Status: Abnormal   Collection Time    06/16/13 10:07 PM      Result Value Range   Glucose-Capillary 261 (*) 70 - 99 mg/dL  BASIC METABOLIC PANEL     Status: Abnormal   Collection Time    06/16/13 10:54 PM      Result Value Range   Sodium 131 (*) 135 - 145 mEq/L   Potassium 3.5  3.5 - 5.1 mEq/L   Chloride 95 (*) 96 - 112 mEq/L   CO2 19  19 - 32 mEq/L   Glucose, Bld 263 (*) 70 - 99 mg/dL   BUN 17  6 - 23 mg/dL   Creatinine, Ser 1.61  0.50 - 1.35 mg/dL   Calcium 8.9  8.4 - 09.6 mg/dL   GFR calc non Af Amer >90  >90 mL/min   GFR calc Af Amer >90  >90 mL/min   Comment:            The eGFR has been calculated     using the CKD EPI equation.     This calculation has not been     validated in all clinical     situations.     eGFR's persistently     <90 mL/min signify     possible Chronic Kidney Disease.  LACTIC ACID, PLASMA     Status: None   Collection Time    06/16/13 11:09 PM      Result Value Range   Lactic Acid, Venous 1.5  0.5 - 2.2 mmol/L  GLUCOSE, CAPILLARY     Status: Abnormal   Collection Time    06/16/13 11:17 PM      Result  Value Range   Glucose-Capillary 237 (*) 70 - 99 mg/dL  GLUCOSE, CAPILLARY     Status: Abnormal   Collection Time    06/17/13 12:19 AM      Result Value Range   Glucose-Capillary 224 (*) 70 - 99 mg/dL  BASIC METABOLIC PANEL     Status: Abnormal   Collection Time    06/17/13 12:21 AM      Result Value Range   Sodium 133 (*) 135 - 145 mEq/L   Potassium 3.4 (*) 3.5 - 5.1 mEq/L   Chloride 96  96 - 112 mEq/L   CO2 23  19 - 32 mEq/L   Glucose, Bld 246 (*) 70 - 99 mg/dL   BUN 17  6 - 23 mg/dL   Creatinine, Ser 0.45  0.50 - 1.35 mg/dL   Calcium 9.2  8.4 - 40.9 mg/dL   GFR calc non Af Amer >90  >90 mL/min   GFR calc Af Amer >90  >90 mL/min   Comment:            The eGFR has been calculated     using the CKD EPI equation.     This calculation has not been     validated in all clinical     situations.     eGFR's persistently     <90 mL/min signify     possible Chronic Kidney Disease.  GLUCOSE, CAPILLARY     Status: Abnormal   Collection Time    06/17/13  1:25 AM      Result Value Range   Glucose-Capillary 205 (*) 70 - 99 mg/dL  COMPREHENSIVE METABOLIC PANEL     Status: Abnormal   Collection Time    06/17/13  2:30 AM      Result Value Range   Sodium 132 (*) 135 - 145 mEq/L   Potassium 3.3 (*) 3.5 - 5.1 mEq/L   Chloride 97  96 - 112 mEq/L   CO2 25  19 - 32 mEq/L   Glucose, Bld 209 (*) 70 - 99 mg/dL   BUN 17  6 - 23 mg/dL   Creatinine, Ser 8.29  0.50 - 1.35 mg/dL   Calcium 8.9  8.4 - 56.2 mg/dL   Total Protein 6.6  6.0 - 8.3 g/dL   Albumin 2.4 (*) 3.5 - 5.2 g/dL   AST 12  0 - 37 U/L   ALT 8  0 - 53 U/L   Alkaline Phosphatase 118 (*) 39 - 117 U/L   Total Bilirubin 1.0  0.3 - 1.2 mg/dL   GFR calc non Af Amer >90  >90 mL/min   GFR calc Af Amer >90  >90 mL/min   Comment:            The eGFR has been calculated     using the CKD EPI equation.     This calculation has not been     validated in all clinical     situations.     eGFR's persistently     <90 mL/min signify      possible Chronic Kidney Disease.  CBC     Status: Abnormal   Collection Time    06/17/13  2:30 AM      Result Value Range   WBC 15.2 (*) 4.0 - 10.5 K/uL   RBC 3.56 (*) 4.22 - 5.81 MIL/uL   Hemoglobin 10.4 (*) 13.0 - 17.0 g/dL   HCT 13.0 (*) 86.5 - 78.4 %   MCV 83.1  78.0 - 100.0 fL   MCH 29.2  26.0 - 34.0 pg   MCHC 35.1  30.0 - 36.0 g/dL   RDW 69.6  29.5 - 28.4 %   Platelets 219  150 - 400 K/uL  PROTIME-INR     Status: Abnormal   Collection Time    06/17/13  2:30 AM      Result Value Range   Prothrombin Time 15.3 (*) 11.6 - 15.2 seconds   INR 1.23  0.00 - 1.49  HEMOGLOBIN A1C     Status: Abnormal   Collection Time    06/17/13  2:30 AM      Result Value Range   Hemoglobin A1C 10.2 (*) <5.7 %   Comment: (NOTE)                                                                               According to the ADA Clinical Practice Recommendations for 2011, when     HbA1c is used as a screening test:      >=6.5%   Diagnostic of Diabetes Mellitus               (if abnormal result is confirmed)     5.7-6.4%  Increased risk of developing Diabetes Mellitus     References:Diagnosis and Classification of Diabetes Mellitus,Diabetes     Care,2011,34(Suppl 1):S62-S69 and Standards of Medical Care in             Diabetes - 2011,Diabetes Care,2011,34 (Suppl 1):S11-S61.   Mean Plasma Glucose 246 (*) <117 mg/dL  GLUCOSE, CAPILLARY     Status: Abnormal   Collection Time    06/17/13  2:33 AM      Result Value Range   Glucose-Capillary 202 (*) 70 - 99 mg/dL  GLUCOSE, CAPILLARY     Status: Abnormal   Collection Time    06/17/13  3:37 AM      Result Value Range   Glucose-Capillary 183 (*) 70 - 99 mg/dL   Comment 1 Notify RN     Comment 2 Documented in Chart    GLUCOSE, CAPILLARY     Status: Abnormal   Collection Time    06/17/13  4:45 AM      Result Value Range   Glucose-Capillary 151 (*) 70 - 99 mg/dL   Comment 1 Notify RN     Comment 2 Documented in Chart    BASIC METABOLIC PANEL      Status: Abnormal   Collection Time    06/17/13  4:50 AM      Result Value Range   Sodium 133 (*) 135 - 145 mEq/L   Potassium 2.8 (*) 3.5 - 5.1 mEq/L   Chloride 99  96 - 112 mEq/L   CO2 25  19 - 32 mEq/L   Glucose, Bld 166 (*) 70 - 99 mg/dL   BUN 17  6 - 23 mg/dL   Creatinine, Ser 1.61  0.50 - 1.35 mg/dL   Calcium 8.7  8.4 - 09.6 mg/dL   GFR calc non Af Amer >90  >90 mL/min   GFR calc Af Amer >90  >90 mL/min   Comment:            The eGFR has been calculated     using the CKD EPI equation.     This calculation has not been     validated in all clinical     situations.     eGFR's persistently     <90 mL/min signify     possible Chronic Kidney Disease.  GLUCOSE, CAPILLARY     Status: Abnormal   Collection Time    06/17/13  5:52 AM      Result Value Range   Glucose-Capillary 164 (*) 70 - 99 mg/dL   Comment 1 Notify RN     Comment 2 Documented in Chart    URINALYSIS, ROUTINE W REFLEX MICROSCOPIC     Status: Abnormal   Collection Time    06/17/13  6:37 AM      Result Value Range   Color, Urine AMBER (*) YELLOW   Comment: BIOCHEMICALS MAY BE AFFECTED BY COLOR   APPearance CLOUDY (*) CLEAR   Specific Gravity, Urine 1.031 (*) 1.005 - 1.030   pH 5.5  5.0 - 8.0   Glucose, UA >1000 (*) NEGATIVE mg/dL   Hgb urine dipstick TRACE (*) NEGATIVE   Bilirubin Urine MODERATE (*) NEGATIVE   Ketones, ur 40 (*) NEGATIVE mg/dL   Protein, ur 30 (*) NEGATIVE mg/dL   Urobilinogen, UA 1.0  0.0 - 1.0 mg/dL   Nitrite NEGATIVE  NEGATIVE   Leukocytes, UA SMALL (*) NEGATIVE  URINE MICROSCOPIC-ADD ON     Status: Abnormal   Collection Time  06/17/13  6:37 AM      Result Value Range   Squamous Epithelial / LPF RARE  RARE   WBC, UA 11-20  <3 WBC/hpf   RBC / HPF 0-2  <3 RBC/hpf   Bacteria, UA FEW (*) RARE   Casts GRANULAR CAST (*) NEGATIVE   Urine-Other FEW YEAST    GLUCOSE, CAPILLARY     Status: Abnormal   Collection Time    06/17/13  6:58 AM      Result Value Range   Glucose-Capillary 143  (*) 70 - 99 mg/dL  GLUCOSE, CAPILLARY     Status: Abnormal   Collection Time    06/17/13  8:02 AM      Result Value Range   Glucose-Capillary 149 (*) 70 - 99 mg/dL  GLUCOSE, CAPILLARY     Status: Abnormal   Collection Time    06/17/13  9:07 AM      Result Value Range   Glucose-Capillary 170 (*) 70 - 99 mg/dL  GLUCOSE, CAPILLARY     Status: Abnormal   Collection Time    06/17/13 10:16 AM      Result Value Range   Glucose-Capillary 165 (*) 70 - 99 mg/dL  GLUCOSE, CAPILLARY     Status: Abnormal   Collection Time    06/17/13 11:21 AM      Result Value Range   Glucose-Capillary 117 (*) 70 - 99 mg/dL  GLUCOSE, CAPILLARY     Status: Abnormal   Collection Time    06/17/13 12:03 PM      Result Value Range   Glucose-Capillary 118 (*) 70 - 99 mg/dL  GLUCOSE, CAPILLARY     Status: None   Collection Time    06/17/13  4:25 PM      Result Value Range   Glucose-Capillary 94  70 - 99 mg/dL  GLUCOSE, CAPILLARY     Status: Abnormal   Collection Time    06/18/13 12:02 AM      Result Value Range   Glucose-Capillary 128 (*) 70 - 99 mg/dL   Comment 1 Notify RN     Comment 2 Documented in Chart      Dg Foot Complete Left  06/16/2013   *RADIOLOGY REPORT*  Clinical Data: Left foot pain, swelling and redness following an injury 4 days ago.  LEFT FOOT - COMPLETE 3+ VIEW  Comparison: None.  Findings: Inferior and posterior calcaneal spur formation.  Dorsal tarsal spur formation.  Talotibial degenerative spur formation. Mild diffuse distal soft tissue swelling.  No fracture or dislocation.  IMPRESSION:  1.  No fracture. 2.  Degenerative changes, as described above.   Original Report Authenticated By: Beckie Salts, M.D.    Review of Systems  All other systems reviewed and are negative.   Blood pressure 144/68, pulse 87, temperature 99.6 F (37.6 C), temperature source Oral, resp. rate 20, height 6\' 4"  (1.93 m), weight 131.725 kg (290 lb 6.4 oz), SpO2 98.00%. Physical Exam On examination patient  has a palpable dorsalis pedis pulse. He is swelling of the foot and leg with massive pitting edema significant swelling cellulitis and blistering of the entire plantar aspect of the foot and blistering dorsally. Review of the radiographs shows no signs of any Charcot arthropathy. Review the MRI scan does show changes in the plantar soft tissue no signs of any osteomyelitis no signs of any focal abscess. Assessment/Plan: Assessment: Blistering cellulitis left foot without signs of Charcot arthropathy.  Plan: After informed consent suture kit was used to  remove the skin and soft tissue from the plantar blister which recovered approximately 3/4 of the plantar aspect of his foot. There was beefy granulation tissue at the base of the wound with no signs of abscess. There was a clear serous fluid within the blisters. There is also blisters dorsally which were debrided of skin and soft tissue and again there was good beefy granulation tissue but no purulence. Will start him on dialysis of cleansing and Silvadene dressing changes daily nonweightbearing on the left foot. I will followup with him daily. Discussed that if we need to consider surgery this would be performed on Friday afternoon I will reevaluate in the morning.  DUDA,MARCUS V 06/18/2013, 7:03 AM

## 2013-06-18 NOTE — Progress Notes (Signed)
PATIENT DETAILS Name: Jaime [name] Age: 57 y.o. Sex: male Date of Birth: [birth year] Admit Date: 06/16/2013 Admitting Physician Marinda Elk, MD PCP:No PCP Per Patient  Subjective: No major issues overnight-left foot essentially unchanged  Assessment/Plan: Principal Problem:   Cellulitis of Left foot -suspect deep tissue infection-however MRI does not show any deep infection, ?abscess seen on the plantar surface on MRI-these were debrided by Dr Lajoyce Corners 6/19 -c/w Vanco and Zosyn -plans are to watch with IV antibiotics and see how he progresses, Dr Lajoyce Corners will evaluate again in am-to see if he needs to go to the OR  DKA -resolved  Diabetes Mellitus Type 2 -c/w 30 units of Levemir and SSI -diabetic education done -will start Metformin on discharge, but anticipate patient will be discharged on Levemir - A1C 10.2  Hypokalemia -2/2 to DKA/Insulin gtt -replete and recheck in am  Disposition: Remain inpatient  DVT Prophylaxis: Prophylactic Lovenox   Code Status: Full code   Family Communication None  Procedures:  None  CONSULTS:  orthopedic surgery   MEDICATIONS: Scheduled Meds: . aspirin EC  81 mg Oral Daily  . enoxaparin (LOVENOX) injection  60 mg Subcutaneous Q24H  . insulin aspart  0-9 Units Subcutaneous TID WC  . insulin detemir  30 Units Subcutaneous Daily  . piperacillin-tazobactam (ZOSYN)  IV  3.375 g Intravenous Q8H  . pneumococcal 23 valent vaccine  0.5 mL Intramuscular Tomorrow-1000  . silver sulfADIAZINE   Topical Daily  . vancomycin  1,250 mg Intravenous Q12H   Continuous Infusions: . sodium chloride 125 mL/hr at 06/17/13 0023  . sodium chloride    . dextrose 5 % and 0.45% NaCl 100 mL/hr at 06/17/13 2348  . insulin (NOVOLIN-R) infusion     PRN Meds:.acetaminophen, acetaminophen, dextrose, ondansetron (ZOFRAN) IV, ondansetron, polyethylene glycol  Antibiotics: Anti-infectives   Start     Dose/Rate Route Frequency Ordered Stop   06/17/13  1000  vancomycin (VANCOCIN) 1,250 mg in sodium chloride 0.9 % 250 mL IVPB     1,250 mg 166.7 mL/hr over 90 Minutes Intravenous Every 12 hours 06/16/13 2152     06/17/13 0900  piperacillin-tazobactam (ZOSYN) IVPB 3.375 g     3.375 g 12.5 mL/hr over 240 Minutes Intravenous Every 8 hours 06/17/13 0820     06/16/13 2300  vancomycin (VANCOCIN) 2,500 mg in sodium chloride 0.9 % 500 mL IVPB     2,500 mg 250 mL/hr over 120 Minutes Intravenous  Once 06/16/13 2152 06/17/13 0128   06/16/13 1815  clindamycin (CLEOCIN) IVPB 600 mg     600 mg 100 mL/hr over 30 Minutes Intravenous  Once 06/16/13 1802 06/16/13 1905       PHYSICAL EXAM: Vital signs in last 24 hours: Filed Vitals:   06/17/13 1300 06/17/13 1746 06/17/13 2105 06/18/13 0526  BP: 156/88 169/78 166/90 144/68  Pulse: 98 98 104 87  Temp: 100 F (37.8 C) 98.9 F (37.2 C) 100.4 F (38 C) 99.6 F (37.6 C)  TempSrc: Oral Oral Oral Oral  Resp: 20 20 18 20   Height:      Weight:    131.725 kg (290 lb 6.4 oz)  SpO2: 95% 97% 96% 98%    Weight change: 4.717 kg (10 lb 6.4 oz) Filed Weights   06/16/13 2136 06/17/13 0401 06/18/13 0526  Weight: 127.007 kg (280 lb) 127 kg (279 lb 15.8 oz) 131.725 kg (290 lb 6.4 oz)   Body mass index is 35.36 kg/(m^2).   Gen Exam: Awake and alert with clear speech.  Neck: Supple, No JVD.   Chest: B/L Clear.   CVS: S1 S2 Regular, no murmurs.  Abdomen: soft, BS +, non tender, non distended.  Extremities: Significantly swollen left foot-blisters in the plantar aspect have been debrided- Left lower leg also swollen. Neurologic: Non Focal.   Skin: No Rash.   Wounds: N/A.   Intake/Output from previous day:  Intake/Output Summary (Last 24 hours) at 06/18/13 0839 Last data filed at 06/18/13 0600  Gross per 24 hour  Intake 4166.33 ml  Output    550 ml  Net 3616.33 ml     LAB RESULTS: CBC  Recent Labs Lab 06/16/13 1713 06/17/13 0230 06/18/13 0547  WBC 19.8* 15.2* 14.8*  HGB 11.6* 10.4* 10.1*   HCT 33.4* 29.6* 30.0*  PLT 275 219 242  MCV 83.3 83.1 84.5  MCH 28.9 29.2 28.5  MCHC 34.7 35.1 33.7  RDW 12.6 12.6 12.8  LYMPHSABS 0.4*  --  0.7  MONOABS 1.7*  --  1.5*  EOSABS 0.0  --  0.0  BASOSABS 0.0  --  0.0    Chemistries   Recent Labs Lab 06/16/13 2254 06/17/13 0021 06/17/13 0230 06/17/13 0450 06/18/13 0547  NA 131* 133* 132* 133* 131*  K 3.5 3.4* 3.3* 2.8* 3.2*  CL 95* 96 97 99 97  CO2 19 23 25 25 23   GLUCOSE 263* 246* 209* 166* 177*  BUN 17 17 17 17 16   CREATININE 0.71 0.73 0.76 0.65 0.86  CALCIUM 8.9 9.2 8.9 8.7 8.4    CBG:  Recent Labs Lab 06/17/13 1121 06/17/13 1203 06/17/13 1625 06/18/13 0002 06/18/13 0738  GLUCAP 117* 118* 94 128* 194*    GFR Estimated Creatinine Clearance: 138.8 ml/min (by C-G formula based on Cr of 0.86).  Coagulation profile  Recent Labs Lab 06/17/13 0230  INR 1.23    Cardiac Enzymes No results found for this basename: CK, CKMB, TROPONINI, MYOGLOBIN,  in the last 168 hours  No components found with this basename: POCBNP,  No results found for this basename: DDIMER,  in the last 72 hours  Recent Labs  06/17/13 0230  HGBA1C 10.2*   No results found for this basename: CHOL, HDL, LDLCALC, TRIG, CHOLHDL, LDLDIRECT,  in the last 72 hours No results found for this basename: TSH, T4TOTAL, FREET3, T3FREE, THYROIDAB,  in the last 72 hours No results found for this basename: VITAMINB12, FOLATE, FERRITIN, TIBC, IRON, RETICCTPCT,  in the last 72 hours No results found for this basename: LIPASE, AMYLASE,  in the last 72 hours  Urine Studies No results found for this basename: UACOL, UAPR, USPG, UPH, UTP, UGL, UKET, UBIL, UHGB, UNIT, UROB, ULEU, UEPI, UWBC, URBC, UBAC, CAST, CRYS, UCOM, BILUA,  in the last 72 hours  MICROBIOLOGY: No results found for this or any previous visit (from the past 240 hour(s)).  RADIOLOGY STUDIES/RESULTS: Dg Foot Complete Left  06/16/2013   *RADIOLOGY REPORT*  Clinical Data: Left foot pain,  swelling and redness following an injury 4 days ago.  LEFT FOOT - COMPLETE 3+ VIEW  Comparison: None.  Findings: Inferior and posterior calcaneal spur formation.  Dorsal tarsal spur formation.  Talotibial degenerative spur formation. Mild diffuse distal soft tissue swelling.  No fracture or dislocation.  IMPRESSION:  1.  No fracture. 2.  Degenerative changes, as described above.   Original Report Authenticated By: Beckie Salts, M.D.    Jeoffrey Massed, MD  [facility] Pager:[phone]  If 7PM-7AM, please contact night-coverage [email or web address] Password [facility] 06/18/2013, 8:39 AM  LOS: 2 days

## 2013-06-18 NOTE — Progress Notes (Signed)
*  PRELIMINARY RESULTS* Vascular Ultrasound Left lower extremity venous duplex has been completed.  Preliminary findings:  Left = negative for DVT.     Farrel Demark, RDMS, RVT  06/18/2013, 11:34 AM

## 2013-06-18 NOTE — Progress Notes (Signed)
Orthopedic Tech Progress Note Patient Details:  Jaime [name] 1956-02-22 [id number]  Ortho Devices Type of Ortho Device: Postop shoe/boot Ortho Device/Splint Interventions: Application   Shawnie Pons 06/18/2013, 9:11 AM

## 2013-06-18 NOTE — Evaluation (Signed)
Physical Therapy Evaluation Patient Details Name: Jaime [name] MRN: [id number] DOB: 22-Nov-1956 Today's Date: 06/18/2013 Time: 1000-1012 PT Time Calculation (min): 12 min  PT Assessment / Plan / Recommendation Clinical Impression    Pt admitted with lt foot cellulitis. Pt currently with functional limitations due to the deficits listed below (see PT Problem List).  Pt will benefit from skilled PT to increase their independence and safety with mobility to allow discharge home with wife.      PT Assessment  Patient needs continued PT services    Follow Up Recommendations  No PT follow up    Does the patient have the potential to tolerate intense rehabilitation      Barriers to Discharge        Equipment Recommendations  None recommended by PT    Recommendations for Other Services     Frequency Min 3X/week    Precautions / Restrictions Precautions Precautions: Fall Restrictions Weight Bearing Restrictions: Yes LLE Weight Bearing: Non weight bearing Other Position/Activity Restrictions: Pt isn't able to maintain this.  Pt bearing some wt through heel of left foot.   Pertinent Vitals/Pain N/A      Mobility  Transfers Transfers: Sit to Stand;Stand to Sit Sit to Stand: 4: Min assist;With upper extremity assist;With armrests;From chair/3-in-1 Stand to Sit: 4: Min assist;With upper extremity assist;With armrests;To chair/3-in-1 Details for Transfer Assistance: verbal cues for hand placement Ambulation/Gait Ambulation/Gait Assistance: 4: Min assist Ambulation Distance (Feet): 75 Feet Assistive device: Rolling walker Ambulation/Gait Assistance Details: Verbal cues to decr wt bearing on lt foot and for gait sequence. Gait Pattern: Step-to pattern;Decreased stance time - left General Gait Details: Limited pt's distance due to pt not able to follow NWB on LLE.    Exercises     PT Diagnosis: Difficulty walking  PT Problem List: Decreased balance;Decreased  mobility;Decreased knowledge of use of DME;Decreased knowledge of precautions PT Treatment Interventions: DME instruction;Gait training;Stair training;Patient/family education;Functional mobility training;Therapeutic activities;Balance training   PT Goals Acute Rehab PT Goals PT Goal Formulation: With patient Time For Goal Achievement: 06/25/13 Potential to Achieve Goals: Good Pt will go Sit to Stand: with supervision PT Goal: Sit to Stand - Progress: Goal set today Pt will go Stand to Sit: with supervision PT Goal: Stand to Sit - Progress: Goal set today Pt will Ambulate: 51 - 150 feet;with supervision;with least restrictive assistive device PT Goal: Ambulate - Progress: Goal set today Pt will Go Up / Down Stairs: 1-2 stairs;with min assist;with least restrictive assistive device PT Goal: Up/Down Stairs - Progress: Goal set today  Visit Information  Assistance Needed: +1    Subjective Data  Subjective: Pt states he is only putting weight through the heel of his left foot. Patient Stated Goal: Return to work   Prior Functioning  Home Living Lives With: Spouse Available Help at Discharge: Family Type of Home: House Home Access: Stairs to enter Secretary/administrator of Steps: 2 Home Layout: One level Home Adaptive Equipment: Walker - rolling Prior Function Level of Independence: Independent Able to Take Stairs?: Yes Driving: Yes Vocation: Full time employment Comments: works at [employer]: No difficulties    Cognition  Cognition Arousal/Alertness: Awake/alert Behavior During Therapy: WFL for tasks assessed/performed Overall Cognitive Status: Within Functional Limits for tasks assessed    Extremity/Trunk Assessment Right Lower Extremity Assessment RLE ROM/Strength/Tone: [facility] for tasks assessed Left Lower Extremity Assessment LLE ROM/Strength/Tone: Deficits;Unable to fully assess LLE ROM/Strength/Tone Deficits: >3/5 but no  resistance given due to foot wound  Balance Balance Balance Assessed: Yes Static Standing Balance Static Standing - Balance Support: Bilateral upper extremity supported Static Standing - Level of Assistance: 4: Min assist  End of Session PT - End of Session Equipment Utilized During Treatment: Gait belt Activity Tolerance: Patient tolerated treatment well Patient left: in chair;with call bell/phone within reach;with family/visitor present Nurse Communication: Mobility status  GP     Jaime [name] 06/18/2013, 11:38 AM  [employer] PT [phone]

## 2013-06-19 ENCOUNTER — Inpatient Hospital Stay (HOSPITAL_COMMUNITY): Payer: 59 | Admitting: Anesthesiology

## 2013-06-19 ENCOUNTER — Encounter (HOSPITAL_COMMUNITY): Payer: Self-pay | Admitting: Anesthesiology

## 2013-06-19 ENCOUNTER — Encounter (HOSPITAL_COMMUNITY): Payer: Self-pay | Admitting: Critical Care Medicine

## 2013-06-19 ENCOUNTER — Encounter (HOSPITAL_COMMUNITY)
Admission: EM | Disposition: A | Discharge status: Home / Self Care | Payer: Self-pay | Source: Home / Self Care | Attending: Internal Medicine

## 2013-06-19 HISTORY — PX: I & D EXTREMITY: SHX5045

## 2013-06-19 LAB — GLUCOSE, CAPILLARY
Glucose-Capillary: 167 mg/dL — ABNORMAL HIGH (ref 70–99)
Glucose-Capillary: 126 mg/dL — ABNORMAL HIGH (ref 70–99)
Glucose-Capillary: 156 mg/dL — ABNORMAL HIGH (ref 70–99)

## 2013-06-19 LAB — CBC
Hemoglobin: 9.8 g/dL — ABNORMAL LOW (ref 13.0–17.0)
MCH: 28.3 pg (ref 26.0–34.0)
Platelets: 250 10*3/uL (ref 150–400)
RBC: 3.46 MIL/uL — ABNORMAL LOW (ref 4.22–5.81)
WBC: 13.2 10*3/uL — ABNORMAL HIGH (ref 4.0–10.5)

## 2013-06-19 LAB — BASIC METABOLIC PANEL
CO2: 26 mEq/L (ref 19–32)
Chloride: 103 mEq/L (ref 96–112)
Glucose, Bld: 230 mg/dL — ABNORMAL HIGH (ref 70–99)
Potassium: 3.9 mEq/L (ref 3.5–5.1)
Sodium: 138 mEq/L (ref 135–145)

## 2013-06-19 SURGERY — IRRIGATION AND DEBRIDEMENT EXTREMITY
Anesthesia: General | Site: Foot | Laterality: Left | Wound class: Dirty or Infected

## 2013-06-19 MED ORDER — MEPERIDINE HCL 25 MG/ML IJ SOLN: 6.2500 mg | INTRAMUSCULAR | Status: DC | PRN

## 2013-06-19 MED ORDER — METOCLOPRAMIDE HCL 5 MG/ML IJ SOLN
5.0000 mg | Freq: Three times a day (TID) | INTRAMUSCULAR | Status: DC | PRN
  Filled 2013-06-19: qty 2

## 2013-06-19 MED ORDER — METOCLOPRAMIDE HCL 10 MG PO TABS: 5.0000 mg | ORAL_TABLET | Freq: Three times a day (TID) | ORAL | Status: DC | PRN

## 2013-06-19 MED ORDER — HYDRALAZINE HCL 20 MG/ML IJ SOLN
10.0000 mg | Freq: Four times a day (QID) | INTRAMUSCULAR | Status: DC | PRN
  Administered 2013-06-19 – 2013-06-22 (×2): 10 mg via INTRAVENOUS
  Filled 2013-06-19 (×2): qty 1

## 2013-06-19 MED ORDER — FENTANYL CITRATE 0.05 MG/ML IJ SOLN
INTRAMUSCULAR | Status: DC | PRN
  Administered 2013-06-19: 50 ug via INTRAVENOUS
  Administered 2013-06-19: 25 ug via INTRAVENOUS
  Administered 2013-06-19: 50 ug via INTRAVENOUS

## 2013-06-19 MED ORDER — MIDAZOLAM HCL 5 MG/5ML IJ SOLN
INTRAMUSCULAR | Status: DC | PRN
  Administered 2013-06-19: 2 mg via INTRAVENOUS

## 2013-06-19 MED ORDER — PROPOFOL 10 MG/ML IV BOLUS
INTRAVENOUS | Status: DC | PRN
  Administered 2013-06-19: 250 mg via INTRAVENOUS

## 2013-06-19 MED ORDER — VANCOMYCIN HCL 500 MG IV SOLR
INTRAVENOUS | Status: DC | PRN
  Administered 2013-06-19: 500 mg

## 2013-06-19 MED ORDER — OXYCODONE HCL 5 MG PO TABS: 5.0000 mg | ORAL_TABLET | Freq: Once | ORAL | Status: AC | PRN

## 2013-06-19 MED ORDER — SODIUM CHLORIDE 0.9 % IV SOLN
INTRAVENOUS | Status: DC
  Administered 2013-06-19: 16:00:00 via INTRAVENOUS

## 2013-06-19 MED ORDER — ONDANSETRON HCL 4 MG/2ML IJ SOLN: 4.0000 mg | Freq: Once | INTRAMUSCULAR | Status: AC | PRN

## 2013-06-19 MED ORDER — VANCOMYCIN HCL 500 MG IV SOLR
INTRAVENOUS | Status: AC
  Filled 2013-06-19: qty 500

## 2013-06-19 MED ORDER — GENTAMICIN SULFATE 40 MG/ML IJ SOLN
INTRAMUSCULAR | Status: AC
  Filled 2013-06-19: qty 4

## 2013-06-19 MED ORDER — ONDANSETRON HCL 4 MG/2ML IJ SOLN: 4.0000 mg | Freq: Four times a day (QID) | INTRAMUSCULAR | Status: DC | PRN

## 2013-06-19 MED ORDER — VANCOMYCIN HCL 1000 MG IV SOLR
INTRAVENOUS | Status: AC
  Filled 2013-06-19: qty 1000

## 2013-06-19 MED ORDER — LIDOCAINE HCL (CARDIAC) 20 MG/ML IV SOLN
INTRAVENOUS | Status: DC | PRN
  Administered 2013-06-19: 80 mg via INTRAVENOUS

## 2013-06-19 MED ORDER — SODIUM CHLORIDE 0.9 % IR SOLN
Status: DC | PRN
  Administered 2013-06-19: 1

## 2013-06-19 MED ORDER — HYDROMORPHONE HCL PF 1 MG/ML IJ SOLN: 0.2500 mg | INTRAMUSCULAR | Status: DC | PRN

## 2013-06-19 MED ORDER — GENTAMICIN SULFATE 40 MG/ML IJ SOLN
INTRAMUSCULAR | Status: DC | PRN
  Administered 2013-06-19: 120 mg via INTRAMUSCULAR

## 2013-06-19 MED ORDER — SODIUM CHLORIDE 0.9 % IR SOLN
Status: DC | PRN
  Administered 2013-06-19: 3000 mL

## 2013-06-19 MED ORDER — LACTATED RINGERS IV SOLN
INTRAVENOUS | Status: DC | PRN
  Administered 2013-06-19: 14:00:00 via INTRAVENOUS

## 2013-06-19 MED ORDER — VANCOMYCIN HCL IN DEXTROSE 1-5 GM/200ML-% IV SOLN
1000.0000 mg | Freq: Two times a day (BID) | INTRAVENOUS | Status: DC
  Administered 2013-06-19 – 2013-06-21 (×6): 1000 mg via INTRAVENOUS
  Filled 2013-06-19 (×7): qty 200

## 2013-06-19 MED ORDER — ONDANSETRON HCL 4 MG PO TABS: 4.0000 mg | ORAL_TABLET | Freq: Four times a day (QID) | ORAL | Status: DC | PRN

## 2013-06-19 MED ORDER — OXYCODONE HCL 5 MG/5ML PO SOLN: 5.0000 mg | Freq: Once | ORAL | Status: AC | PRN

## 2013-06-19 MED ORDER — HYDROMORPHONE HCL PF 1 MG/ML IJ SOLN
0.5000 mg | INTRAMUSCULAR | Status: DC | PRN
  Administered 2013-06-19: 1 mg via INTRAVENOUS
  Filled 2013-06-19: qty 1

## 2013-06-19 MED ORDER — ONDANSETRON HCL 4 MG/2ML IJ SOLN
INTRAMUSCULAR | Status: DC | PRN
  Administered 2013-06-19: 4 mg via INTRAVENOUS

## 2013-06-19 SURGICAL SUPPLY — 55 items
BANDAGE GAUZE ELAST BULKY 4 IN (GAUZE/BANDAGES/DRESSINGS) ×1 IMPLANT
BLADE SURG 10 STRL SS (BLADE) ×2 IMPLANT
BNDG COHESIVE 4X5 TAN STRL (GAUZE/BANDAGES/DRESSINGS) ×2 IMPLANT
BNDG COHESIVE 6X5 TAN STRL LF (GAUZE/BANDAGES/DRESSINGS) ×4 IMPLANT
BNDG GAUZE STRTCH 6 (GAUZE/BANDAGES/DRESSINGS) ×6 IMPLANT
CLOTH BEACON ORANGE TIMEOUT ST (SAFETY) ×2 IMPLANT
COTTON STERILE ROLL (GAUZE/BANDAGES/DRESSINGS) ×2 IMPLANT
COVER SURGICAL LIGHT HANDLE (MISCELLANEOUS) ×2 IMPLANT
CUFF TOURNIQUET SINGLE 18IN (TOURNIQUET CUFF) ×2 IMPLANT
CUFF TOURNIQUET SINGLE 24IN (TOURNIQUET CUFF) IMPLANT
CUFF TOURNIQUET SINGLE 34IN LL (TOURNIQUET CUFF) IMPLANT
CUFF TOURNIQUET SINGLE 44IN (TOURNIQUET CUFF) IMPLANT
DRAIN PENROSE 1/4X12 LTX STRL (WOUND CARE) ×2 IMPLANT
DRAPE U-SHAPE 47X51 STRL (DRAPES) ×2 IMPLANT
DRSG ADAPTIC 3X8 NADH LF (GAUZE/BANDAGES/DRESSINGS) ×2 IMPLANT
DRSG PAD ABDOMINAL 8X10 ST (GAUZE/BANDAGES/DRESSINGS) ×1 IMPLANT
DURAPREP 26ML APPLICATOR (WOUND CARE) ×2 IMPLANT
ELECT CAUTERY BLADE 6.4 (BLADE) IMPLANT
ELECT REM PT RETURN 9FT ADLT (ELECTROSURGICAL)
ELECTRODE REM PT RTRN 9FT ADLT (ELECTROSURGICAL) IMPLANT
GLOVE BIO SURGEON STRL SZ 6.5 (GLOVE) ×1 IMPLANT
GLOVE BIO SURGEON STRL SZ7 (GLOVE) ×1 IMPLANT
GLOVE BIOGEL PI IND STRL 7.0 (GLOVE) IMPLANT
GLOVE BIOGEL PI IND STRL 9 (GLOVE) ×1 IMPLANT
GLOVE BIOGEL PI INDICATOR 7.0 (GLOVE) ×2
GLOVE BIOGEL PI INDICATOR 9 (GLOVE) ×1
GLOVE SURG ORTHO 9.0 STRL STRW (GLOVE) ×2 IMPLANT
GOWN PREVENTION PLUS XLARGE (GOWN DISPOSABLE) ×2 IMPLANT
GOWN SRG XL XLNG 56XLVL 4 (GOWN DISPOSABLE) ×1 IMPLANT
GOWN STRL NON-REIN XL XLG LVL4 (GOWN DISPOSABLE) ×2
HANDPIECE INTERPULSE COAX TIP (DISPOSABLE) ×2
KIT BASIN OR (CUSTOM PROCEDURE TRAY) ×2 IMPLANT
KIT ROOM TURNOVER OR (KITS) ×2 IMPLANT
KIT STIMULAN RAPID CURE 5CC (Orthopedic Implant) ×1 IMPLANT
MANIFOLD NEPTUNE II (INSTRUMENTS) ×2 IMPLANT
NDL 18GX1X1/2 (RX/OR ONLY) (NEEDLE) IMPLANT
NEEDLE 18GX1X1/2 (RX/OR ONLY) (NEEDLE) ×2 IMPLANT
NS IRRIG 1000ML POUR BTL (IV SOLUTION) ×2 IMPLANT
PACK ORTHO EXTREMITY (CUSTOM PROCEDURE TRAY) ×2 IMPLANT
PAD ARMBOARD 7.5X6 YLW CONV (MISCELLANEOUS) ×4 IMPLANT
PADDING CAST COTTON 6X4 STRL (CAST SUPPLIES) ×2 IMPLANT
SET HNDPC FAN SPRY TIP SCT (DISPOSABLE) IMPLANT
SPONGE GAUZE 4X4 12PLY (GAUZE/BANDAGES/DRESSINGS) ×2 IMPLANT
SPONGE LAP 18X18 X RAY DECT (DISPOSABLE) ×2 IMPLANT
STOCKINETTE IMPERVIOUS 9X36 MD (GAUZE/BANDAGES/DRESSINGS) ×2 IMPLANT
SUT ETHILON 4 0 PS 2 18 (SUTURE) ×2 IMPLANT
SWAB CULTURE LIQUID MINI MALE (MISCELLANEOUS) ×2 IMPLANT
SYR CONTROL 10ML LL (SYRINGE) ×1 IMPLANT
TOWEL OR 17X24 6PK STRL BLUE (TOWEL DISPOSABLE) ×2 IMPLANT
TOWEL OR 17X26 10 PK STRL BLUE (TOWEL DISPOSABLE) ×2 IMPLANT
TUBE ANAEROBIC SPECIMEN COL (MISCELLANEOUS) ×2 IMPLANT
TUBE CONNECTING 12X1/4 (SUCTIONS) ×2 IMPLANT
UNDERPAD 30X30 INCONTINENT (UNDERPADS AND DIAPERS) ×2 IMPLANT
WATER STERILE IRR 1000ML POUR (IV SOLUTION) ×2 IMPLANT
YANKAUER SUCT BULB TIP NO VENT (SUCTIONS) ×2 IMPLANT

## 2013-06-19 NOTE — Preoperative (Signed)
Beta Blockers   Reason not to administer Beta Blockers:Not Applicable 

## 2013-06-19 NOTE — Progress Notes (Signed)
ANTIBIOTIC CONSULT NOTE - FOLLOW UP  Pharmacy Consult for Vancomycin and Zosyn; Lovenox  Indication: diabetic foot infection;  VTE prophylaxis (BMI >30)  No Known Allergies  Patient Measurements: Height: 6\' 4"  (193 cm) Weight: 292 lb 4.8 oz (132.586 kg) IBW/kg (Calculated) : 86.8  Vital Signs: Temp: 98.9 F (37.2 C) (06/20 0530) Temp src: Oral (06/20 0530) BP: 160/73 mmHg (06/20 0530) Pulse Rate: 85 (06/20 0530) Intake/Output from previous day: 06/19 0701 - 06/20 0700 In: 480 [P.O.:480] Out: -  Intake/Output from this shift:    Labs:  Recent Labs  06/17/13 0230 06/17/13 0450 06/18/13 0547 06/19/13 0555  WBC 15.2*  --  14.8* 13.2*  HGB 10.4*  --  10.1* 9.8*  PLT 219  --  242 250  CREATININE 0.76 0.65 0.86 0.86   Estimated Creatinine Clearance: 139.2 ml/min (by C-G formula based on Cr of 0.86).    Assessment: 27 YOM with diabetic foot, possible deep tissue infection on vancomycin and zosyn, No much improvement with IV abx, plan for I&D today. WBC 13.2, afebrile. Vancomycin trough slightly supratherapeutic on 1250 mg IV Q 12, renal fx stable, with est. crcl > 100 ml/min. Per MD note, will continue current IV abx.   Vanc 6/17>> Zoysn 6/18>> Clinda x 1 6/17  6/17 blood x 2 - 6/18 urine   Goal of Therapy:  appropriate Zosyn dose for renal function & infection VTE prophylaxis dose of Lovenox  Plan:  - Continue zosyn 3.375 grams IV q8hrs (each infused over 4 hours). - Decrease Vancomycin to 1000 mg IV q12hrs. - Fu low renal function, culture data, and plans after I&D - Continue Lovenox 60 mg SQ q24hrs (~ 0.5 mg/kg/day). - CBC at least q72hrs while on Lovenox.  Bayard Hugger, PharmD, BCPS  Clinical Pharmacist  Pager: [phone]   06/19/2013,10:29 AM

## 2013-06-19 NOTE — Anesthesia Preprocedure Evaluation (Addendum)
Anesthesia Evaluation  Patient identified by MRN, date of birth, ID band Patient awake    Reviewed: Allergy & Precautions, H&P , NPO status , Patient's Chart, lab work & pertinent test results  Airway Mallampati: I TM Distance: >3 FB Neck ROM: Full    Dental  (+) Dental Advisory Given   Pulmonary          Cardiovascular     Neuro/Psych    GI/Hepatic   Endo/Other  diabetes, Type 2, Insulin Dependent and Oral Hypoglycemic Agents  Renal/GU      Musculoskeletal   Abdominal   Peds  Hematology   Anesthesia Other Findings   Reproductive/Obstetrics                          Anesthesia Physical Anesthesia Plan  ASA: III  Anesthesia Plan: General   Post-op Pain Management:    Induction: Intravenous  Airway Management Planned: Oral ETT  Additional Equipment:   Intra-op Plan:   Post-operative Plan: Extubation in OR  Informed Consent: I have reviewed the patients History and Physical, chart, labs and discussed the procedure including the risks, benefits and alternatives for the proposed anesthesia with the patient or authorized representative who has indicated his/her understanding and acceptance.     Plan Discussed with: CRNA and Surgeon  Anesthesia Plan Comments:         Anesthesia Quick Evaluation

## 2013-06-19 NOTE — Anesthesia Procedure Notes (Signed)
Procedure Name: LMA Insertion Date/Time: 06/19/2013 1:56 PM Performed by: Elon Alas Pre-anesthesia Checklist: Patient identified, Timeout performed, Emergency Drugs available, Suction available and Patient being monitored Patient Re-evaluated:Patient Re-evaluated prior to inductionOxygen Delivery Method: Circle system utilized Preoxygenation: Pre-oxygenation with 100% oxygen Intubation Type: IV induction Ventilation: Mask ventilation without difficulty LMA: LMA with gastric port inserted LMA Size: 5.0 Placement Confirmation: positive ETCO2 and breath sounds checked- equal and bilateral Tube secured with: Tape Dental Injury: Teeth and Oropharynx as per pre-operative assessment

## 2013-06-19 NOTE — Progress Notes (Signed)
PT Cancellation Note  Patient Details Name: Jaime [name] MRN: [id number] DOB: 08/07/1956   Cancelled Treatment:    Reason Eval/Treat Not Completed: Medical issues which prohibited therapy (Pt for surgical debridement this PM.)   [facility] 06/19/2013, 8:29 AM

## 2013-06-19 NOTE — Progress Notes (Signed)
PATIENT DETAILS Name: Jaime [name] Age: 57 y.o. Sex: male Date of Birth: Oct 22, 1956 Admit Date: 06/16/2013 Admitting Physician Marinda Elk, MD PCP:No PCP Per Patient  Subjective: No major issues overnight-left foot essentially unchanged  Assessment/Plan: Principal Problem:   Cellulitis of Left foot -suspect deep tissue infection-however MRI does not show any deep infection, ?abscess seen on the plantar surface on MRI-these were debrided by Dr Lajoyce Corners 6/19. Since not much improvement with IV abx-patient will be going to the OR today -c/w Vanco and Zosyn  DKA -resolved  Diabetes Mellitus Type 2 -c/w 30 units of Levemir and SSI -diabetic education done -will start Metformin on discharge, but anticipate patient will be discharged on Levemir - A1C 10.2  Hypokalemia -2/2 to DKA/Insulin gtt -resolved, recheck periodically  Disposition: Remain inpatient  DVT Prophylaxis: Prophylactic Lovenox   Code Status: Full code   Family Communication None  Procedures:  None  CONSULTS:  orthopedic surgery   MEDICATIONS: Scheduled Meds: . aspirin EC  81 mg Oral Daily  . enoxaparin (LOVENOX) injection  60 mg Subcutaneous Q24H  . insulin aspart  0-9 Units Subcutaneous TID WC  . insulin detemir  30 Units Subcutaneous Daily  . piperacillin-tazobactam (ZOSYN)  IV  3.375 g Intravenous Q8H  . pneumococcal 23 valent vaccine  0.5 mL Intramuscular Tomorrow-1000  . silver sulfADIAZINE   Topical Daily  . vancomycin  1,250 mg Intravenous Q12H   Continuous Infusions: . dextrose 5 % and 0.45% NaCl 100 mL/hr at 06/17/13 2348   PRN Meds:.acetaminophen, acetaminophen, hydrALAZINE, ondansetron (ZOFRAN) IV, ondansetron, polyethylene glycol  Antibiotics: Anti-infectives   Start     Dose/Rate Route Frequency Ordered Stop   06/17/13 1000  vancomycin (VANCOCIN) 1,250 mg in sodium chloride 0.9 % 250 mL IVPB     1,250 mg 166.7 mL/hr over 90 Minutes Intravenous Every 12 hours 06/16/13  2152     06/17/13 0900  piperacillin-tazobactam (ZOSYN) IVPB 3.375 g     3.375 g 12.5 mL/hr over 240 Minutes Intravenous Every 8 hours 06/17/13 0820     06/16/13 2300  vancomycin (VANCOCIN) 2,500 mg in sodium chloride 0.9 % 500 mL IVPB     2,500 mg 250 mL/hr over 120 Minutes Intravenous  Once 06/16/13 2152 06/17/13 0128   06/16/13 1815  clindamycin (CLEOCIN) IVPB 600 mg     600 mg 100 mL/hr over 30 Minutes Intravenous  Once 06/16/13 1802 06/16/13 1905       PHYSICAL EXAM: Vital signs in last 24 hours: Filed Vitals:   06/18/13 2115 06/19/13 0159 06/19/13 0500 06/19/13 0530  BP: 154/80 170/86  160/73  Pulse: 93 87  85  Temp: 98.8 F (37.1 C) 98.2 F (36.8 C)  98.9 F (37.2 C)  TempSrc: Oral Oral  Oral  Resp: 18 20  20   Height:      Weight:   132.586 kg (292 lb 4.8 oz)   SpO2: 97% 98%  97%    Weight change: 0.862 kg (1 lb 14.4 oz) Filed Weights   06/17/13 0401 06/18/13 0526 06/19/13 0500  Weight: 127 kg (279 lb 15.8 oz) 131.725 kg (290 lb 6.4 oz) 132.586 kg (292 lb 4.8 oz)   Body mass index is 35.59 kg/(m^2).   Gen Exam: Awake and alert with clear speech.   Neck: Supple, No JVD.   Chest: B/L Clear.   CVS: S1 S2 Regular, no murmurs.  Abdomen: soft, BS +, non tender, non distended.  Extremities: Significantly swollen left foot-blisters in the plantar aspect have been  debrided- Left lower leg also swollen. Neurologic: Non Focal.   Skin: No Rash.   Wounds: N/A.   Intake/Output from previous day:  Intake/Output Summary (Last 24 hours) at 06/19/13 0957 Last data filed at 06/19/13 0935  Gross per 24 hour  Intake    240 ml  Output      0 ml  Net    240 ml     LAB RESULTS: CBC  Recent Labs Lab 06/16/13 1713 06/17/13 0230 06/18/13 0547 06/19/13 0555  WBC 19.8* 15.2* 14.8* 13.2*  HGB 11.6* 10.4* 10.1* 9.8*  HCT 33.4* 29.6* 30.0* 29.3*  PLT 275 219 242 250  MCV 83.3 83.1 84.5 84.7  MCH 28.9 29.2 28.5 28.3  MCHC 34.7 35.1 33.7 33.4  RDW 12.6 12.6 12.8 13.1   LYMPHSABS 0.4*  --  0.7  --   MONOABS 1.7*  --  1.5*  --   EOSABS 0.0  --  0.0  --   BASOSABS 0.0  --  0.0  --     Chemistries   Recent Labs Lab 06/17/13 0021 06/17/13 0230 06/17/13 0450 06/18/13 0547 06/19/13 0555  NA 133* 132* 133* 131* 138  K 3.4* 3.3* 2.8* 3.2* 3.9  CL 96 97 99 97 103  CO2 23 25 25 23 26   GLUCOSE 246* 209* 166* 177* 230*  BUN 17 17 17 16 16   CREATININE 0.73 0.76 0.65 0.86 0.86  CALCIUM 9.2 8.9 8.7 8.4 9.0    CBG:  Recent Labs Lab 06/18/13 0738 06/18/13 1157 06/18/13 1651 06/18/13 2113 06/19/13 0748  GLUCAP 194* 249* 263* 229* 167*    GFR Estimated Creatinine Clearance: 139.2 ml/min (by C-G formula based on Cr of 0.86).  Coagulation profile  Recent Labs Lab 06/17/13 0230  INR 1.23    Cardiac Enzymes No results found for this basename: CK, CKMB, TROPONINI, MYOGLOBIN,  in the last 168 hours  No components found with this basename: POCBNP,  No results found for this basename: DDIMER,  in the last 72 hours  Recent Labs  06/17/13 0230  HGBA1C 10.2*   No results found for this basename: CHOL, HDL, LDLCALC, TRIG, CHOLHDL, LDLDIRECT,  in the last 72 hours No results found for this basename: TSH, T4TOTAL, FREET3, T3FREE, THYROIDAB,  in the last 72 hours No results found for this basename: VITAMINB12, FOLATE, FERRITIN, TIBC, IRON, RETICCTPCT,  in the last 72 hours No results found for this basename: LIPASE, AMYLASE,  in the last 72 hours  Urine Studies No results found for this basename: UACOL, UAPR, USPG, UPH, UTP, UGL, UKET, UBIL, UHGB, UNIT, UROB, ULEU, UEPI, UWBC, URBC, UBAC, CAST, CRYS, UCOM, BILUA,  in the last 72 hours  MICROBIOLOGY: Recent Results (from the past 240 hour(s))  CULTURE, BLOOD (ROUTINE X 2)     Status: None   Collection Time    06/16/13 10:58 PM      Result Value Range Status   Specimen Description BLOOD HAND LEFT   Final   Special Requests BOTTLES DRAWN AEROBIC ONLY 5CC   Final   Culture  Setup Time  06/17/2013 10:50   Final   Culture     Final   Value:        BLOOD CULTURE RECEIVED NO GROWTH TO DATE CULTURE WILL BE HELD FOR 5 DAYS BEFORE ISSUING A FINAL NEGATIVE REPORT   Report Status PENDING   Incomplete  CULTURE, BLOOD (ROUTINE X 2)     Status: None   Collection Time    06/16/13  11:08 PM      Result Value Range Status   Specimen Description BLOOD ARM RIGHT   Final   Special Requests BOTTLES DRAWN AEROBIC ONLY 2CC   Final   Culture  Setup Time 06/17/2013 10:50   Final   Culture     Final   Value:        BLOOD CULTURE RECEIVED NO GROWTH TO DATE CULTURE WILL BE HELD FOR 5 DAYS BEFORE ISSUING A FINAL NEGATIVE REPORT   Report Status PENDING   Incomplete  URINE CULTURE     Status: None   Collection Time    06/17/13  6:37 AM      Result Value Range Status   Specimen Description URINE, RANDOM   Final   Special Requests NONE   Final   Culture  Setup Time 06/17/2013 13:56   Final   Colony Count 40,000 COLONIES/ML   Final   Culture YEAST   Final   Report Status 06/18/2013 FINAL   Final    RADIOLOGY STUDIES/RESULTS: Dg Foot Complete Left  06/16/2013   *RADIOLOGY REPORT*  Clinical Data: Left foot pain, swelling and redness following an injury 4 days ago.  LEFT FOOT - COMPLETE 3+ VIEW  Comparison: None.  Findings: Inferior and posterior calcaneal spur formation.  Dorsal tarsal spur formation.  Talotibial degenerative spur formation. Mild diffuse distal soft tissue swelling.  No fracture or dislocation.  IMPRESSION:  1.  No fracture. 2.  Degenerative changes, as described above.   Original Report Authenticated By: Beckie Salts, M.D.    Jeoffrey Massed, MD  [facility] Pager:[phone]  If 7PM-7AM, please contact night-coverage [email or web address] Password [facility] 06/19/2013, 9:57 AM   LOS: 3 days

## 2013-06-19 NOTE — Progress Notes (Signed)
3 large abscess is identified. The plantar medial abscess also had a separate abscess deep to the plantar fascia. The dorsal lateral abscess communicated with a plantar abscess as well. There are Penrose drains to drain the wound dressing may be changed as needed continue IV antibiotics strict nonweightbearing. Cultures were obtained separately from both the dorsal abscess in the plantar abscess.

## 2013-06-19 NOTE — Op Note (Signed)
OPERATIVE REPORT  DATE OF SURGERY: 06/19/2013  PATIENT:  Jaime [name],  57 y.o. male  PRE-OPERATIVE DIAGNOSIS:  infected left foot  POST-OPERATIVE DIAGNOSIS:  infected left foot  PROCEDURE:  Procedure(s): IRRIGATION AND DEBRIDEMENT EXTREMITY Excisional debridement skin soft tissue muscle and tendon, 3 separate abscesses. Cultures obtained x2. Antibiotic beads placed with 500 mg vancomycin and 160 mg gentamicin  SURGEON:  Surgeon(s): Nadara Mustard, MD  ANESTHESIA:   general  EBL:  min ML  SPECIMEN:  Source of Specimen:  Cultures obtained x2 both dorsal and plantar  TOURNIQUET:  * No tourniquets in log *  PROCEDURE DETAILS: Patient is a 57 year old gentleman with diabetic insensate neuropathy who presented with swelling redness and abscess of the left foot. Patient underwent decompression of superficial wounds at bedside after debridement patient still had persistent redness cellulitis MRI scan confirms deeper abscesses and patient was brought to the operating room at this time for surgical debridement the deep abscesses. Risks and benefits were discussed including persistent infection neurovascular injury need for additional surgery. Patient states he understands and wished to proceed at this time. Description of procedure patient brought to the operating room and underwent a general anesthetic. After adequate levels of anesthesia were obtained patient's left lower extremity was prepped using DuraPrep draped into a sterile field. A dorsal incision was made along the fourth metatarsal. There was a deep purulent abscess this was excisionally debrided sharply with a knife and Ronjair. Cultures were obtained. This seem to communicate with a separate abscess plantarly a plantar incision was made necrotic tissue was removed and the student abscesses communicated. This also was irrigated with pulsatile lavage. A separate incision was made along the medial border the plantar aspect of the foot  this was a very extensive incision roughly 6 cm long and patient had a large abscess on the plantar medial aspect aspect of his foot completely separate from the dorsal lateral abscess. This is also debrided of skin soft tissue and muscle. This irrigated with pulsatile lavage this did seem superficial to the plantar fascia. A longitudinal incision was then made to the plantar fashion was a deep abscess to the plantar fascia this was also irrigated and debrided with pulsatile lavage. Antibiotic beads were then placed deep to the plantar fascia superficial plantar fascia and dorsal wound. All wounds were awake open with a Penrose drain. The wounds were closed loosely closed with 2-0 nylon. The wounds were covered Adaptic orthopedic sponges AB dressing Kerlix and Coban. Patient was extubated taken to the PACU in stable condition.  PLAN OF CARE: Admit to inpatient   PATIENT DISPOSITION:  PACU - hemodynamically stable.   Nadara Mustard, MD 06/19/2013 2:28 PM

## 2013-06-19 NOTE — Progress Notes (Signed)
Inpatient Diabetes Program Recommendations  AACE/ADA: New Consensus Statement on Inpatient Glycemic Control (2013)  Target Ranges:  Prepandial:   less than 140 mg/dL      Peak postprandial:   less than 180 mg/dL (1-2 hours)      Critically ill patients:  140 - 180 mg/dL    Results for GODSON, POLLAN (MRN [id number]) as of 06/19/2013 10:50  Ref. Range 06/18/2013 07:38 06/18/2013 11:57 06/18/2013 16:51 06/18/2013 21:13  Glucose-Capillary Latest Range: 70-99 mg/dL 782 (H) 956 (H) 213 (H) 229 (H)    Recommend the following in-hospital insulin adjustments:  1. Increase Levemir to 32 units daily 2. Increase Novolog correction scale (SSI) to Moderate tid ac + HS (currently ordered as Sensitive scale)   Will follow. Ambrose Finland RN, MSN, CDE Diabetes Coordinator Inpatient Diabetes Program [phone]

## 2013-06-19 NOTE — Progress Notes (Signed)
Patient ID: Jaime [name], male   DOB: [birth year], 57 y.o.   MRN: [id number] Patient cellulitis and left foot is better the skin is wrinkling. After decompression of the superficial fluid collections both plantarly and dorsally patient does not have as much improvement as I would expect. We will plan for excisional debridement of the plantar and dorsal abscesses. Plan for placement of antibiotic beads and possible placement of a wound VAC. Surgery this afternoon. N.p.o.

## 2013-06-19 NOTE — Transfer of Care (Signed)
Immediate Anesthesia Transfer of Care Note  Patient: Jaime [name]  Procedure(s) Performed: Procedure(s): IRRIGATION AND DEBRIDEMENT EXTREMITY (Left)  Patient Location: PACU  Anesthesia Type:General  Level of Consciousness: awake and alert   Airway & Oxygen Therapy: Patient Spontanous Breathing and Patient connected to nasal cannula oxygen  Post-op Assessment: Report given to PACU RN, Post -op Vital signs reviewed and stable and Patient moving all extremities X 4  Post vital signs: Reviewed and stable  Complications: No apparent anesthesia complications

## 2013-06-20 DIAGNOSIS — I10 Essential (primary) hypertension: Secondary | ICD-10-CM

## 2013-06-20 LAB — GLUCOSE, CAPILLARY
Glucose-Capillary: 160 mg/dL — ABNORMAL HIGH (ref 70–99)
Glucose-Capillary: 122 mg/dL — ABNORMAL HIGH (ref 70–99)
Glucose-Capillary: 124 mg/dL — ABNORMAL HIGH (ref 70–99)

## 2013-06-20 LAB — CBC
MCH: 28.1 pg (ref 26.0–34.0)
Platelets: 248 10*3/uL (ref 150–400)
RBC: 3.42 MIL/uL — ABNORMAL LOW (ref 4.22–5.81)
WBC: 12.7 10*3/uL — ABNORMAL HIGH (ref 4.0–10.5)

## 2013-06-20 LAB — COMPREHENSIVE METABOLIC PANEL
BUN: 11 mg/dL (ref 6–23)
CO2: 26 mEq/L (ref 19–32)
Chloride: 99 mEq/L (ref 96–112)
Creatinine, Ser: 0.84 mg/dL (ref 0.50–1.35)
GFR calc non Af Amer: >90 mL/min (ref >90)
Total Bilirubin: 0.5 mg/dL (ref 0.3–1.2)

## 2013-06-20 MED ORDER — POTASSIUM CHLORIDE 10 MEQ/100ML IV SOLN
10.0000 meq | INTRAVENOUS | Status: AC
  Administered 2013-06-20 (×4): 10 meq via INTRAVENOUS
  Filled 2013-06-20 (×4): qty 100

## 2013-06-20 MED ORDER — LISINOPRIL 10 MG PO TABS
10.0000 mg | ORAL_TABLET | Freq: Every day | ORAL | Status: DC
  Administered 2013-06-20: 10 mg via ORAL
  Filled 2013-06-20 (×2): qty 1

## 2013-06-20 NOTE — Progress Notes (Signed)
Physical Therapy Treatment/ Re-evaluation Patient Details Name: Jaime [name] MRN: [id number] DOB: 03/03/1956 Today's Date: 06/20/2013 Time: 6962-9528 PT Time Calculation (min): 27 min  PT Assessment / Plan / Recommendation Comments on Treatment Session  Pt is 57 yo male s/p I&D left foot for cellulitis with NWB status now. Pt is able to effectively keep this when he has a shoe on the right foot but can only go short distances. Therefore, recommend w/c with elevating left leg rest for further distance. He will need to practice 3 steps prior to d/c to get in his home. Recommend HHPT.     Follow Up Recommendations  Home health PT     Does the patient have the potential to tolerate intense rehabilitation     Barriers to Discharge        Equipment Recommendations  Wheelchair (measurements PT) (wife to measure doorways)    Recommendations for Other Services    Frequency Min 3X/week   Plan Frequency remains appropriate;Discharge plan needs to be updated    Precautions / Restrictions Precautions Precautions: Fall Precaution Comments: pt instructed in complete NWB as well as proper elevation of the LLE Restrictions Weight Bearing Restrictions: Yes LLE Weight Bearing: Non weight bearing Other Position/Activity Restrictions: pt has improved in NWB, having shoe on right foot helps considerably   Pertinent Vitals/Pain 5/10 LLE pain    Mobility  Bed Mobility Bed Mobility: Not assessed (pt in chair) Transfers Transfers: Sit to Stand;Stand to Sit Sit to Stand: 4: Min guard;From chair/3-in-1;With upper extremity assist Stand to Sit: 4: Min guard;To chair/3-in-1;With upper extremity assist Details for Transfer Assistance: pt sits without good control, education given on controlling descent, pt able to keep NWB with transfers Ambulation/Gait Ambulation/Gait Assistance: 4: Min assist Ambulation Distance (Feet): 5 Feet (fwd and back) Assistive device: Rolling walker Ambulation/Gait  Assistance Details: vc's for taking full wt through UE's and keeping LLE NWB, practiced bkwds hopping as well to be able to navigate close spaces such as bathrooms Gait Pattern:  (hop) Gait velocity: decreased General Gait Details: pt can only tolerate very short distances hopping. Recommend w/c with elevating left leg rest for distance and could potentially use a knee walker in NWB will be for prolonged period Stairs: No Wheelchair Mobility Wheelchair Mobility: No    Exercises     PT Diagnosis:    PT Problem List:   PT Treatment Interventions:     PT Goals Acute Rehab PT Goals PT Goal Formulation: With patient Time For Goal Achievement: 06/27/13 Potential to Achieve Goals: Good Pt will go Sit to Stand: with supervision PT Goal: Sit to Stand - Progress: Goal set today Pt will go Stand to Sit: with supervision PT Goal: Stand to Sit - Progress: Goal set today Pt will Ambulate: with supervision;16 - 50 feet;with rolling walker PT Goal: Ambulate - Progress: Goal set today Pt will Go Up / Down Stairs: 3-5 stairs;with min assist;with least restrictive assistive device PT Goal: Up/Down Stairs - Progress: Progressing toward goal  Visit Information  Last PT Received On: 06/20/13 Assistance Needed: +1    Subjective Data  Subjective: pt asking if he really has to hop or can put heel down Patient Stated Goal: return to work   Cognition  Cognition Arousal/Alertness: Awake/alert Behavior During Therapy: WFL for tasks assessed/performed Overall Cognitive Status: Within Functional Limits for tasks assessed    Balance  Balance Balance Assessed: Yes Static Standing Balance Static Standing - Balance Support: Bilateral upper extremity supported;During functional activity Static Standing -  Level of Assistance: 4: Min assist Dynamic Standing Balance Dynamic Standing - Balance Support: Bilateral upper extremity supported;During functional activity Dynamic Standing - Level of Assistance: 4:  Min assist  End of Session PT - End of Session Equipment Utilized During Treatment: Gait belt Activity Tolerance: Patient tolerated treatment well Patient left: in chair;with call bell/phone within reach Nurse Communication: Mobility status   GP   Lyanne Co, PT  Acute Rehab Services  [phone]   Lyanne Co 06/20/2013, 11:44 AM

## 2013-06-20 NOTE — Progress Notes (Signed)
PATIENT DETAILS Name: Jaime [name] Age: 57 y.o. Sex: male Date of Birth: 07-Jul-1956 Admit Date: 06/16/2013 Admitting Physician Marinda Elk, MD PCP:No PCP Per Patient  Subjective: No major issues overnight-left foot essentially unchanged  Assessment/Plan: Principal Problem:   Cellulitis of Left foot with abscess - Patient was admitted and started on empiric vancomycin. Because of no clinical improvement Zosyn was added. Clinically, deep tissue infection was suspected. MRI of the foot was obtained, which showed diffuse edema throughout the ankle and the foot, which showed ill-defined fluid within the plantar subcutaneous fat, with peripheral enhancement of these ill-defined fluid collections. Abscess was likely, Dr. Lurlean Horns was consulted, who did a bedside debridement of the blisters that were present on the plantar surface of the patient's foot. Plans were done to continue with IV antibiotics to see if patient would improve following bedside debridement, unfortunately there was no improvement, patient was taken to the OR on 6/20, weight 3 large abscess were found, one of them was deep to the plantar fascia. - Postoperatively, patient is doing well, we are still awaiting intraoperative culture results. - For now we'll continue with vancomycin and Zosyn, Dr. Lajoyce Corners recommending 4 weeks of IV antibiotic therapy. Will likely need a PICC line prior to discharge.  DKA -resolved  Diabetes Mellitus Type 2 -c/w 30 units of Levemir and SSI -diabetic education done -will start Metformin on discharge, but anticipate patient will be discharged on Levemir - A1C 10.2  Hypokalemia -2/2 to DKA/Insulin gtt - Will repeat and recheck in a.m.  Hypertension - Start lisinopril, saline lock IV fluids  Disposition: Remain inpatient  DVT Prophylaxis: Prophylactic Lovenox   Code Status: Full code   Family Communication None  Procedures:  None  CONSULTS:  orthopedic  surgery   MEDICATIONS: Scheduled Meds: . aspirin EC  81 mg Oral Daily  . enoxaparin (LOVENOX) injection  60 mg Subcutaneous Q24H  . insulin aspart  0-9 Units Subcutaneous TID WC  . insulin detemir  30 Units Subcutaneous Daily  . lisinopril  10 mg Oral Daily  . piperacillin-tazobactam (ZOSYN)  IV  3.375 g Intravenous Q8H  . potassium chloride  10 mEq Intravenous Q1 Hr x 4  . vancomycin  1,000 mg Intravenous Q12H   Continuous Infusions: . sodium chloride 20 mL/hr at 06/19/13 1530  . dextrose 5 % and 0.45% NaCl 100 mL/hr at 06/17/13 2348   PRN Meds:.acetaminophen, acetaminophen, hydrALAZINE, HYDROmorphone (DILAUDID) injection, HYDROmorphone (DILAUDID) injection, meperidine (DEMEROL) injection, metoCLOPramide (REGLAN) injection, metoCLOPramide, ondansetron (ZOFRAN) IV, ondansetron (ZOFRAN) IV, ondansetron, ondansetron, polyethylene glycol  Antibiotics: Anti-infectives   Start     Dose/Rate Route Frequency Ordered Stop   06/19/13 1420  vancomycin (VANCOCIN) powder  Status:  Discontinued       As needed 06/19/13 1423 06/19/13 1432   06/19/13 1417  gentamicin (GARAMYCIN) injection  Status:  Discontinued       As needed 06/19/13 1418 06/19/13 1432   06/19/13 1200  vancomycin (VANCOCIN) IVPB 1000 mg/200 mL premix     1,000 mg 200 mL/hr over 60 Minutes Intravenous Every 12 hours 06/19/13 1026     06/17/13 1000  vancomycin (VANCOCIN) 1,250 mg in sodium chloride 0.9 % 250 mL IVPB  Status:  Discontinued     1,250 mg 166.7 mL/hr over 90 Minutes Intravenous Every 12 hours 06/16/13 2152 06/19/13 1026   06/17/13 0900  piperacillin-tazobactam (ZOSYN) IVPB 3.375 g     3.375 g 12.5 mL/hr over 240 Minutes Intravenous Every 8 hours 06/17/13 0820  06/16/13 2300  vancomycin (VANCOCIN) 2,500 mg in sodium chloride 0.9 % 500 mL IVPB     2,500 mg 250 mL/hr over 120 Minutes Intravenous  Once 06/16/13 2152 06/17/13 0128   06/16/13 1815  clindamycin (CLEOCIN) IVPB 600 mg     600 mg 100 mL/hr over 30  Minutes Intravenous  Once 06/16/13 1802 06/16/13 1905       PHYSICAL EXAM: Vital signs in last 24 hours: Filed Vitals:   06/19/13 1522 06/19/13 2033 06/20/13 0507 06/20/13 0851  BP: 153/79 167/82 182/67 151/67  Pulse: 84 98 83   Temp: 98.5 F (36.9 C) 98.2 F (36.8 C) 98.7 F (37.1 C)   TempSrc: Oral Oral Oral   Resp: 24 22 20    Height:      Weight:   128.277 kg (282 lb 12.8 oz)   SpO2: 91% 93% 94%     Weight change: -4.309 kg (-9 lb 8 oz) Filed Weights   06/18/13 0526 06/19/13 0500 06/20/13 0507  Weight: 131.725 kg (290 lb 6.4 oz) 132.586 kg (292 lb 4.8 oz) 128.277 kg (282 lb 12.8 oz)   Body mass index is 34.44 kg/(m^2).   Gen Exam: Awake and alert with clear speech.   Neck: Supple, No JVD.   Chest: B/L Clear.   CVS: S1 S2 Regular, no murmurs.  Abdomen: soft, BS +, non tender, non distended.  Extremities: Left leg bandaged-did not open. Neurologic: Non Focal.   Skin: No Rash.   Wounds: N/A.   Intake/Output from previous day:  Intake/Output Summary (Last 24 hours) at 06/20/13 1012 Last data filed at 06/20/13 0600  Gross per 24 hour  Intake   1200 ml  Output     25 ml  Net   1175 ml     LAB RESULTS: CBC  Recent Labs Lab 06/16/13 1713 06/17/13 0230 06/18/13 0547 06/19/13 0555 06/20/13 0513  WBC 19.8* 15.2* 14.8* 13.2* 12.7*  HGB 11.6* 10.4* 10.1* 9.8* 9.6*  HCT 33.4* 29.6* 30.0* 29.3* 29.1*  PLT 275 219 242 250 248  MCV 83.3 83.1 84.5 84.7 85.1  MCH 28.9 29.2 28.5 28.3 28.1  MCHC 34.7 35.1 33.7 33.4 33.0  RDW 12.6 12.6 12.8 13.1 13.2  LYMPHSABS 0.4*  --  0.7  --   --   MONOABS 1.7*  --  1.5*  --   --   EOSABS 0.0  --  0.0  --   --   BASOSABS 0.0  --  0.0  --   --     Chemistries   Recent Labs Lab 06/17/13 0230 06/17/13 0450 06/18/13 0547 06/19/13 0555 06/20/13 0513  NA 132* 133* 131* 138 135  K 3.3* 2.8* 3.2* 3.9 2.9*  CL 97 99 97 103 99  CO2 25 25 23 26 26   GLUCOSE 209* 166* 177* 230* 146*  BUN 17 17 16 16 11   CREATININE 0.76  0.65 0.86 0.86 0.84  CALCIUM 8.9 8.7 8.4 9.0 8.3*    CBG:  Recent Labs Lab 06/19/13 1155 06/19/13 1444 06/19/13 1707 06/19/13 2126 06/20/13 0722  GLUCAP 160* 158* 126* 156* 156*    GFR Estimated Creatinine Clearance: 140.2 ml/min (by C-G formula based on Cr of 0.84).  Coagulation profile  Recent Labs Lab 06/17/13 0230  INR 1.23    Cardiac Enzymes No results found for this basename: CK, CKMB, TROPONINI, MYOGLOBIN,  in the last 168 hours  No components found with this basename: POCBNP,  No results found for this basename: DDIMER,  in  the last 72 hours No results found for this basename: HGBA1C,  in the last 72 hours No results found for this basename: CHOL, HDL, LDLCALC, TRIG, CHOLHDL, LDLDIRECT,  in the last 72 hours No results found for this basename: TSH, T4TOTAL, FREET3, T3FREE, THYROIDAB,  in the last 72 hours No results found for this basename: VITAMINB12, FOLATE, FERRITIN, TIBC, IRON, RETICCTPCT,  in the last 72 hours No results found for this basename: LIPASE, AMYLASE,  in the last 72 hours  Urine Studies No results found for this basename: UACOL, UAPR, USPG, UPH, UTP, UGL, UKET, UBIL, UHGB, UNIT, UROB, ULEU, UEPI, UWBC, URBC, UBAC, CAST, CRYS, UCOM, BILUA,  in the last 72 hours  MICROBIOLOGY: Recent Results (from the past 240 hour(s))  CULTURE, BLOOD (ROUTINE X 2)     Status: None   Collection Time    06/16/13 10:58 PM      Result Value Range Status   Specimen Description BLOOD HAND LEFT   Final   Special Requests BOTTLES DRAWN AEROBIC ONLY 5CC   Final   Culture  Setup Time 06/17/2013 10:50   Final   Culture     Final   Value:        BLOOD CULTURE RECEIVED NO GROWTH TO DATE CULTURE WILL BE HELD FOR 5 DAYS BEFORE ISSUING A FINAL NEGATIVE REPORT   Report Status PENDING   Incomplete  CULTURE, BLOOD (ROUTINE X 2)     Status: None   Collection Time    06/16/13 11:08 PM      Result Value Range Status   Specimen Description BLOOD ARM RIGHT   Final   Special  Requests BOTTLES DRAWN AEROBIC ONLY 2CC   Final   Culture  Setup Time 06/17/2013 10:50   Final   Culture     Final   Value:        BLOOD CULTURE RECEIVED NO GROWTH TO DATE CULTURE WILL BE HELD FOR 5 DAYS BEFORE ISSUING A FINAL NEGATIVE REPORT   Report Status PENDING   Incomplete  URINE CULTURE     Status: None   Collection Time    06/17/13  6:37 AM      Result Value Range Status   Specimen Description URINE, RANDOM   Final   Special Requests NONE   Final   Culture  Setup Time 06/17/2013 13:56   Final   Colony Count 40,000 COLONIES/ML   Final   Culture YEAST   Final   Report Status 06/18/2013 FINAL   Final  SURGICAL PCR SCREEN     Status: None   Collection Time    06/19/13  7:54 AM      Result Value Range Status   MRSA, PCR NEGATIVE  NEGATIVE Final   Staphylococcus aureus NEGATIVE  NEGATIVE Final   Comment:            The Xpert SA Assay (FDA     approved for NASAL specimens     in patients over 59 years of age),     is one component of     a comprehensive surveillance     program.  Test performance has     been validated by [employer] for patients greater     than or equal to 30 year old.     It is not intended     to diagnose infection nor to     guide or monitor treatment.  CULTURE, ROUTINE-ABSCESS     Status: None  Collection Time    06/19/13  2:03 PM      Result Value Range Status   Specimen Description ABSCESS LEFT FOOT   Final   Special Requests DORSAL PT ON VANCO AND ZOSYN   Final   Gram Stain     Final   Value: FEW WBC PRESENT, PREDOMINANTLY MONONUCLEAR     NO SQUAMOUS EPITHELIAL CELLS SEEN     RARE GRAM POSITIVE COCCI     IN PAIRS IN CLUSTERS   Culture Culture reincubated for better growth   Final   Report Status PENDING   Incomplete  ANAEROBIC CULTURE     Status: None   Collection Time    06/19/13  2:03 PM      Result Value Range Status   Specimen Description ABSCESS LEFT FOOT   Final   Special Requests DORSAL PT ON VANCO AND ZOSYN NO1   Final    Gram Stain     Final   Value: FEW WBC PRESENT, PREDOMINANTLY MONONUCLEAR     NO SQUAMOUS EPITHELIAL CELLS SEEN     RARE GRAM POSITIVE COCCI     IN PAIRS IN CLUSTERS   Culture PENDING   Incomplete   Report Status PENDING   Incomplete  CULTURE, ROUTINE-ABSCESS     Status: None   Collection Time    06/19/13  2:05 PM      Result Value Range Status   Specimen Description ABSCESS LEFT FOOT   Final   Special Requests NO2 PLANTAR PT ON VANCO AND ZOSYN   Final   Gram Stain     Final   Value: FEW WBC PRESENT, PREDOMINANTLY MONONUCLEAR     NO SQUAMOUS EPITHELIAL CELLS SEEN     NO ORGANISMS SEEN   Culture Culture reincubated for better growth   Final   Report Status PENDING   Incomplete  ANAEROBIC CULTURE     Status: None   Collection Time    06/19/13  2:05 PM      Result Value Range Status   Specimen Description ABSCESS LEFT FOOT   Final   Special Requests NO2 PLANTAR PT ON VANCO AND ZOSYN   Final   Gram Stain     Final   Value: FEW WBC PRESENT, PREDOMINANTLY MONONUCLEAR     NO SQUAMOUS EPITHELIAL CELLS SEEN     NO ORGANISMS SEEN   Culture PENDING   Incomplete   Report Status PENDING   Incomplete    RADIOLOGY STUDIES/RESULTS: Dg Foot Complete Left  06/16/2013   *RADIOLOGY REPORT*  Clinical Data: Left foot pain, swelling and redness following an injury 4 days ago.  LEFT FOOT - COMPLETE 3+ VIEW  Comparison: None.  Findings: Inferior and posterior calcaneal spur formation.  Dorsal tarsal spur formation.  Talotibial degenerative spur formation. Mild diffuse distal soft tissue swelling.  No fracture or dislocation.  IMPRESSION:  1.  No fracture. 2.  Degenerative changes, as described above.   Original Report Authenticated By: Beckie Salts, M.D.    Jeoffrey Massed, MD  [facility] Pager:[phone]  If 7PM-7AM, please contact night-coverage [email or web address] Password [email or web address] 06/20/2013, 10:12 AM   LOS: 4 days

## 2013-06-20 NOTE — Progress Notes (Signed)
Patient ID: Jaime [name], male   DOB: 1956/06/12, 57 y.o.   MRN: [id number] I will change the dressing tomorrow. Patient had 3 large abscess including an abscess deep to the plantar fascia. I recommend patient be placed on 4 weeks IV antibiotics with a PICC line. Cultures pending from the wound abscesses.

## 2013-06-20 NOTE — Progress Notes (Signed)
Orthopedic Tech Progress Note Patient Details:  Jaime [name] Apr 14, 1956 [id number] Spoke with nurse, patient already has post op shoe  Patient ID: [name], male   DOB: 1956/02/28, 57 y.o.   MRN: [id number]   Orie Rout 06/20/2013, 1:10 PM

## 2013-06-21 LAB — CBC
HCT: 30.4 % — ABNORMAL LOW (ref 39.0–52.0)
Hemoglobin: 10 g/dL — ABNORMAL LOW (ref 13.0–17.0)
MCHC: 32.9 g/dL (ref 30.0–36.0)
WBC: 9.9 10*3/uL (ref 4.0–10.5)

## 2013-06-21 LAB — BASIC METABOLIC PANEL
BUN: 10 mg/dL (ref 6–23)
Chloride: 100 mEq/L (ref 96–112)
Glucose, Bld: 121 mg/dL — ABNORMAL HIGH (ref 70–99)
Potassium: 3.1 mEq/L — ABNORMAL LOW (ref 3.5–5.1)

## 2013-06-21 LAB — GLUCOSE, CAPILLARY
Glucose-Capillary: 131 mg/dL — ABNORMAL HIGH (ref 70–99)
Glucose-Capillary: 162 mg/dL — ABNORMAL HIGH (ref 70–99)

## 2013-06-21 MED ORDER — OXYCODONE HCL 5 MG PO TABS: 5.0000 mg | ORAL_TABLET | Freq: Four times a day (QID) | ORAL | Status: DC | PRN

## 2013-06-21 MED ORDER — FLUCONAZOLE 100 MG PO TABS
100.0000 mg | ORAL_TABLET | Freq: Every day | ORAL | Status: DC
  Administered 2013-06-21 – 2013-06-22 (×2): 100 mg via ORAL
  Filled 2013-06-21 (×2): qty 1

## 2013-06-21 MED ORDER — LISINOPRIL 20 MG PO TABS
20.0000 mg | ORAL_TABLET | Freq: Every day | ORAL | Status: DC
  Administered 2013-06-21: 20 mg via ORAL
  Filled 2013-06-21 (×2): qty 1

## 2013-06-21 MED ORDER — POTASSIUM CHLORIDE CRYS ER 20 MEQ PO TBCR
40.0000 meq | EXTENDED_RELEASE_TABLET | Freq: Once | ORAL | Status: AC
  Administered 2013-06-21: 40 meq via ORAL
  Filled 2013-06-21: qty 2

## 2013-06-21 MED ORDER — POTASSIUM CHLORIDE CRYS ER 20 MEQ PO TBCR
20.0000 meq | EXTENDED_RELEASE_TABLET | Freq: Once | ORAL | Status: AC
  Administered 2013-06-21: 20 meq via ORAL
  Filled 2013-06-21: qty 1

## 2013-06-21 NOTE — Progress Notes (Signed)
Patient ID: Jaime [name], male   DOB: 04/29/1956, 57 y.o.   MRN: [id number] Postoperative day 2 status post irrigation debridement multiple abscesses left foot. The dressing is removed today the 2 drains are removed. The cellulitis has shown significant improvement. We will have the patient start with dial soap cleansing daily progressive ambulation nonweightbearing on the right. Anticipate patient will be safe for discharge on Monday with IV vancomycin or other antibiotics for 4 weeks. I'll followup in the office in 2 weeks.

## 2013-06-21 NOTE — Progress Notes (Addendum)
PATIENT DETAILS Name: Jaime [name] Age: 57 y.o. Sex: male Date of Birth: December 06, 1956 Admit Date: 06/16/2013 Admitting Physician Marinda Elk, MD PCP:No PCP Per Patient  Subjective: No major issues overnight-left foot much improved per Dr Lajoyce Corners  Assessment/Plan: Principal Problem:   Cellulitis of Left foot with abscess - Patient was admitted and started on empiric vancomycin. Because of no clinical improvement Zosyn was added. Clinically, deep tissue infection was suspected. MRI of the foot was obtained, which showed diffuse edema throughout the ankle and the foot, which showed ill-defined fluid within the plantar subcutaneous fat, with peripheral enhancement of these ill-defined fluid collections. Abscess was likely, Dr. Lurlean Horns was consulted, who did a bedside debridement of the blisters that were present on the plantar surface of the patient's foot. Plans were done to continue with IV antibiotics to see if patient would improve following bedside debridement, unfortunately there was no improvement, patient was taken to the OR on 6/20, weight 3 large abscess were found, one of them was deep to the plantar fascia. - Postoperatively, patient is doing well,  intraoperative cultures showing moderate Staph Aureus -Blood cultures on 6/17 remain neg so far - For now we'll continue with vancomycin and discontinue Zosyn as intra op cultures are positive for Staph-awaiting final sensitivities, Dr. Lajoyce Corners recommending 4 weeks of IV antibiotic therapy. Will place PICC line prior to discharge.  DKA -resolved  UTI -urine culture on 6/18 positive for yeast-on Day 1 of Fluconazole  Diabetes Mellitus Type 2 -c/w 30 units of Levemir and SSI -diabetic education done -will start Metformin on discharge, but anticipate patient will be discharged on Levemir - A1C 10.2  Hypokalemia -2/2 to DKA/Insulin gtt - Will repeat and recheck in a.m.  Hypertension - Increase Lisinopril to 20 mg  daily  Disposition: Remain inpatient  DVT Prophylaxis: Prophylactic Lovenox   Code Status: Full code   Family Communication None  Procedures:  None  CONSULTS:  orthopedic surgery   MEDICATIONS: Scheduled Meds: . aspirin EC  81 mg Oral Daily  . enoxaparin (LOVENOX) injection  60 mg Subcutaneous Q24H  . fluconazole  100 mg Oral Daily  . insulin aspart  0-9 Units Subcutaneous TID WC  . insulin detemir  30 Units Subcutaneous Daily  . lisinopril  20 mg Oral Daily  . piperacillin-tazobactam (ZOSYN)  IV  3.375 g Intravenous Q8H  . vancomycin  1,000 mg Intravenous Q12H   Continuous Infusions:   PRN Meds:.acetaminophen, acetaminophen, hydrALAZINE, HYDROmorphone (DILAUDID) injection, metoCLOPramide (REGLAN) injection, metoCLOPramide, ondansetron (ZOFRAN) IV, ondansetron (ZOFRAN) IV, ondansetron, ondansetron, oxyCODONE, polyethylene glycol  Antibiotics: Anti-infectives   Start     Dose/Rate Route Frequency Ordered Stop   06/21/13 1000  fluconazole (DIFLUCAN) tablet 100 mg     100 mg Oral Daily 06/21/13 0714     06/19/13 1420  vancomycin (VANCOCIN) powder  Status:  Discontinued       As needed 06/19/13 1423 06/19/13 1432   06/19/13 1417  gentamicin (GARAMYCIN) injection  Status:  Discontinued       As needed 06/19/13 1418 06/19/13 1432   06/19/13 1200  vancomycin (VANCOCIN) IVPB 1000 mg/200 mL premix     1,000 mg 200 mL/hr over 60 Minutes Intravenous Every 12 hours 06/19/13 1026     06/17/13 1000  vancomycin (VANCOCIN) 1,250 mg in sodium chloride 0.9 % 250 mL IVPB  Status:  Discontinued     1,250 mg 166.7 mL/hr over 90 Minutes Intravenous Every 12 hours 06/16/13 2152 06/19/13 1026   06/17/13 0900  piperacillin-tazobactam (ZOSYN) IVPB 3.375 g     3.375 g 12.5 mL/hr over 240 Minutes Intravenous Every 8 hours 06/17/13 0820     06/16/13 2300  vancomycin (VANCOCIN) 2,500 mg in sodium chloride 0.9 % 500 mL IVPB     2,500 mg 250 mL/hr over 120 Minutes Intravenous  Once 06/16/13  2152 06/17/13 0128   06/16/13 1815  clindamycin (CLEOCIN) IVPB 600 mg     600 mg 100 mL/hr over 30 Minutes Intravenous  Once 06/16/13 1802 06/16/13 1905       PHYSICAL EXAM: Vital signs in last 24 hours: Filed Vitals:   06/20/13 0851 06/20/13 1443 06/20/13 2108 06/21/13 0457  BP: 151/67 147/77 160/81 164/85  Pulse:  86 85 78  Temp:  98.4 F (36.9 C) 98.7 F (37.1 C) 98.7 F (37.1 C)  TempSrc:  Oral Oral Oral  Resp:  20 20 18   Height:      Weight:    127.5 kg (281 lb 1.4 oz)  SpO2:  96% 99% 95%    Weight change: -0.777 kg (-1 lb 11.4 oz) Filed Weights   06/19/13 0500 06/20/13 0507 06/21/13 0457  Weight: 132.586 kg (292 lb 4.8 oz) 128.277 kg (282 lb 12.8 oz) 127.5 kg (281 lb 1.4 oz)   Body mass index is 34.23 kg/(m^2).   Gen Exam: Awake and alert with clear speech.   Neck: Supple, No JVD.   Chest: B/L Clear.   CVS: S1 S2 Regular, no murmurs.  Abdomen: soft, BS +, non tender, non distended.  Extremities: Left leg bandaged-did not open. Neurologic: Non Focal.   Skin: No Rash.   Wounds: N/A.   Intake/Output from previous day:  Intake/Output Summary (Last 24 hours) at 06/21/13 0956 Last data filed at 06/21/13 0932  Gross per 24 hour  Intake    600 ml  Output    350 ml  Net    250 ml     LAB RESULTS: CBC  Recent Labs Lab 06/16/13 1713 06/17/13 0230 06/18/13 0547 06/19/13 0555 06/20/13 0513 06/21/13 0500  WBC 19.8* 15.2* 14.8* 13.2* 12.7* 9.9  HGB 11.6* 10.4* 10.1* 9.8* 9.6* 10.0*  HCT 33.4* 29.6* 30.0* 29.3* 29.1* 30.4*  PLT 275 219 242 250 248 268  MCV 83.3 83.1 84.5 84.7 85.1 86.4  MCH 28.9 29.2 28.5 28.3 28.1 28.4  MCHC 34.7 35.1 33.7 33.4 33.0 32.9  RDW 12.6 12.6 12.8 13.1 13.2 13.5  LYMPHSABS 0.4*  --  0.7  --   --   --   MONOABS 1.7*  --  1.5*  --   --   --   EOSABS 0.0  --  0.0  --   --   --   BASOSABS 0.0  --  0.0  --   --   --     Chemistries   Recent Labs Lab 06/17/13 0450 06/18/13 0547 06/19/13 0555 06/20/13 0513 06/21/13 0500   NA 133* 131* 138 135 136  K 2.8* 3.2* 3.9 2.9* 3.1*  CL 99 97 103 99 100  CO2 25 23 26 26 27   GLUCOSE 166* 177* 230* 146* 121*  BUN 17 16 16 11 10   CREATININE 0.65 0.86 0.86 0.84 0.90  CALCIUM 8.7 8.4 9.0 8.3* 8.5  MG  --   --   --   --  2.0    CBG:  Recent Labs Lab 06/19/13 2126 06/20/13 0722 06/20/13 1203 06/20/13 1643 06/20/13 2110  GLUCAP 156* 156* 160* 122* 124*    GFR  Estimated Creatinine Clearance: 130.5 ml/min (by C-G formula based on Cr of 0.9).  Coagulation profile  Recent Labs Lab 06/17/13 0230  INR 1.23    Cardiac Enzymes No results found for this basename: CK, CKMB, TROPONINI, MYOGLOBIN,  in the last 168 hours  No components found with this basename: POCBNP,  No results found for this basename: DDIMER,  in the last 72 hours No results found for this basename: HGBA1C,  in the last 72 hours No results found for this basename: CHOL, HDL, LDLCALC, TRIG, CHOLHDL, LDLDIRECT,  in the last 72 hours No results found for this basename: TSH, T4TOTAL, FREET3, T3FREE, THYROIDAB,  in the last 72 hours No results found for this basename: VITAMINB12, FOLATE, FERRITIN, TIBC, IRON, RETICCTPCT,  in the last 72 hours No results found for this basename: LIPASE, AMYLASE,  in the last 72 hours  Urine Studies No results found for this basename: UACOL, UAPR, USPG, UPH, UTP, UGL, UKET, UBIL, UHGB, UNIT, UROB, ULEU, UEPI, UWBC, URBC, UBAC, CAST, CRYS, UCOM, BILUA,  in the last 72 hours  MICROBIOLOGY: Recent Results (from the past 240 hour(s))  CULTURE, BLOOD (ROUTINE X 2)     Status: None   Collection Time    06/16/13 10:58 PM      Result Value Range Status   Specimen Description BLOOD HAND LEFT   Final   Special Requests BOTTLES DRAWN AEROBIC ONLY 5CC   Final   Culture  Setup Time 06/17/2013 10:50   Final   Culture     Final   Value:        BLOOD CULTURE RECEIVED NO GROWTH TO DATE CULTURE WILL BE HELD FOR 5 DAYS BEFORE ISSUING A FINAL NEGATIVE REPORT   Report Status  PENDING   Incomplete  CULTURE, BLOOD (ROUTINE X 2)     Status: None   Collection Time    06/16/13 11:08 PM      Result Value Range Status   Specimen Description BLOOD ARM RIGHT   Final   Special Requests BOTTLES DRAWN AEROBIC ONLY 2CC   Final   Culture  Setup Time 06/17/2013 10:50   Final   Culture     Final   Value:        BLOOD CULTURE RECEIVED NO GROWTH TO DATE CULTURE WILL BE HELD FOR 5 DAYS BEFORE ISSUING A FINAL NEGATIVE REPORT   Report Status PENDING   Incomplete  URINE CULTURE     Status: None   Collection Time    06/17/13  6:37 AM      Result Value Range Status   Specimen Description URINE, RANDOM   Final   Special Requests NONE   Final   Culture  Setup Time 06/17/2013 13:56   Final   Colony Count 40,000 COLONIES/ML   Final   Culture YEAST   Final   Report Status 06/18/2013 FINAL   Final  SURGICAL PCR SCREEN     Status: None   Collection Time    06/19/13  7:54 AM      Result Value Range Status   MRSA, PCR NEGATIVE  NEGATIVE Final   Staphylococcus aureus NEGATIVE  NEGATIVE Final   Comment:            The Xpert SA Assay (FDA     approved for NASAL specimens     in patients over 75 years of age),     is one component of     a comprehensive surveillance     program.  Test performance has     been validated by [facility] for patients greater     than or equal to 67 year old.     It is not intended     to diagnose infection nor to     guide or monitor treatment.  CULTURE, ROUTINE-ABSCESS     Status: None   Collection Time    06/19/13  2:03 PM      Result Value Range Status   Specimen Description ABSCESS LEFT FOOT   Final   Special Requests DORSAL PT ON VANCO AND ZOSYN   Final   Gram Stain     Final   Value: FEW WBC PRESENT, PREDOMINANTLY MONONUCLEAR     NO SQUAMOUS EPITHELIAL CELLS SEEN     RARE GRAM POSITIVE COCCI     IN PAIRS IN CLUSTERS   Culture     Final   Value: MODERATE STAPHYLOCOCCUS AUREUS     Note: RIFAMPIN AND GENTAMICIN SHOULD NOT BE USED AS  SINGLE DRUGS FOR TREATMENT OF STAPH INFECTIONS.   Report Status PENDING   Incomplete  ANAEROBIC CULTURE     Status: None   Collection Time    06/19/13  2:03 PM      Result Value Range Status   Specimen Description ABSCESS LEFT FOOT   Final   Special Requests DORSAL PT ON VANCO AND ZOSYN NO1   Final   Gram Stain     Final   Value: FEW WBC PRESENT, PREDOMINANTLY MONONUCLEAR     NO SQUAMOUS EPITHELIAL CELLS SEEN     RARE GRAM POSITIVE COCCI     IN PAIRS IN CLUSTERS   Culture     Final   Value: NO ANAEROBES ISOLATED; CULTURE IN PROGRESS FOR 5 DAYS   Report Status PENDING   Incomplete  CULTURE, ROUTINE-ABSCESS     Status: None   Collection Time    06/19/13  2:05 PM      Result Value Range Status   Specimen Description ABSCESS LEFT FOOT   Final   Special Requests NO2 PLANTAR PT ON VANCO AND ZOSYN   Final   Gram Stain     Final   Value: FEW WBC PRESENT, PREDOMINANTLY MONONUCLEAR     NO SQUAMOUS EPITHELIAL CELLS SEEN     NO ORGANISMS SEEN   Culture     Final   Value: MODERATE STAPHYLOCOCCUS AUREUS     Note: RIFAMPIN AND GENTAMICIN SHOULD NOT BE USED AS SINGLE DRUGS FOR TREATMENT OF STAPH INFECTIONS.   Report Status PENDING   Incomplete  ANAEROBIC CULTURE     Status: None   Collection Time    06/19/13  2:05 PM      Result Value Range Status   Specimen Description ABSCESS LEFT FOOT   Final   Special Requests NO2 PLANTAR PT ON VANCO AND ZOSYN   Final   Gram Stain     Final   Value: FEW WBC PRESENT, PREDOMINANTLY MONONUCLEAR     NO SQUAMOUS EPITHELIAL CELLS SEEN     NO ORGANISMS SEEN   Culture     Final   Value: NO ANAEROBES ISOLATED; CULTURE IN PROGRESS FOR 5 DAYS   Report Status PENDING   Incomplete    RADIOLOGY STUDIES/RESULTS: Dg Foot Complete Left  06/16/2013   *RADIOLOGY REPORT*  Clinical Data: Left foot pain, swelling and redness following an injury 4 days ago.  LEFT FOOT - COMPLETE 3+ VIEW  Comparison: None.  Findings: Inferior  and posterior calcaneal spur formation.   Dorsal tarsal spur formation.  Talotibial degenerative spur formation. Mild diffuse distal soft tissue swelling.  No fracture or dislocation.  IMPRESSION:  1.  No fracture. 2.  Degenerative changes, as described above.   Original Report Authenticated By: Beckie Salts, M.D.    Jeoffrey Massed, MD  [facility] Pager:[phone]  If 7PM-7AM, please contact night-coverage [email or web address] Password [facility] 06/21/2013, 9:56 AM   LOS: 5 days

## 2013-06-21 NOTE — Progress Notes (Addendum)
(   charted on wrong pt)

## 2013-06-22 LAB — BASIC METABOLIC PANEL
BUN: 9 mg/dL (ref 6–23)
Creatinine, Ser: 0.87 mg/dL (ref 0.50–1.35)
GFR calc Af Amer: >90 mL/min (ref >90)
GFR calc non Af Amer: >90 mL/min (ref >90)

## 2013-06-22 LAB — CULTURE, ROUTINE-ABSCESS

## 2013-06-22 LAB — GLUCOSE, CAPILLARY
Glucose-Capillary: 146 mg/dL — ABNORMAL HIGH (ref 70–99)
Glucose-Capillary: 157 mg/dL — ABNORMAL HIGH (ref 70–99)

## 2013-06-22 MED ORDER — VANCOMYCIN HCL IN DEXTROSE 1-5 GM/200ML-% IV SOLN: 1000.0000 mg | Freq: Two times a day (BID) | INTRAVENOUS | Status: DC

## 2013-06-22 MED ORDER — OXYCODONE HCL 5 MG PO TABS: 5.0000 mg | ORAL_TABLET | Freq: Four times a day (QID) | ORAL | Status: DC | PRN

## 2013-06-22 MED ORDER — CEFAZOLIN SODIUM-DEXTROSE 2-3 GM-% IV SOLR: 2.0000 g | Freq: Three times a day (TID) | INTRAVENOUS | Status: DC

## 2013-06-22 MED ORDER — LISINOPRIL 40 MG PO TABS
40.0000 mg | ORAL_TABLET | Freq: Every day | ORAL | Status: DC
  Administered 2013-06-22: 40 mg via ORAL
  Filled 2013-06-22: qty 1

## 2013-06-22 MED ORDER — LEVEMIR FLEXPEN 100 UNIT/ML ~~LOC~~ SOPN: 30.0000 [IU] | PEN_INJECTOR | Freq: Every day | SUBCUTANEOUS | Status: DC

## 2013-06-22 MED ORDER — POTASSIUM CHLORIDE CRYS ER 20 MEQ PO TBCR
40.0000 meq | EXTENDED_RELEASE_TABLET | Freq: Once | ORAL | Status: AC
  Administered 2013-06-22: 40 meq via ORAL
  Filled 2013-06-22: qty 2

## 2013-06-22 MED ORDER — CEFAZOLIN SODIUM-DEXTROSE 2-3 GM-% IV SOLR
2.0000 g | Freq: Three times a day (TID) | INTRAVENOUS | Status: DC
  Administered 2013-06-22: 2 g via INTRAVENOUS
  Filled 2013-06-22 (×3): qty 50

## 2013-06-22 MED ORDER — INSULIN PEN STARTER KIT
1.0000 | Freq: Once | Status: AC
  Administered 2013-06-22: 1
  Filled 2013-06-22: qty 1

## 2013-06-22 MED ORDER — FLUCONAZOLE 100 MG PO TABS: 100.0000 mg | ORAL_TABLET | Freq: Every day | ORAL | Status: DC

## 2013-06-22 MED ORDER — INSULIN PEN NEEDLE 32G X 8 MM MISC: Status: DC

## 2013-06-22 MED ORDER — LISINOPRIL 40 MG PO TABS: 40.0000 mg | ORAL_TABLET | Freq: Every day | ORAL | Status: DC

## 2013-06-22 MED ORDER — HYDRALAZINE HCL 20 MG/ML IJ SOLN
10.0000 mg | Freq: Once | INTRAMUSCULAR | Status: AC
  Administered 2013-06-22: 10 mg via INTRAVENOUS
  Filled 2013-06-22: qty 0.5

## 2013-06-22 MED ORDER — METFORMIN HCL 500 MG PO TABS: 500.0000 mg | ORAL_TABLET | Freq: Two times a day (BID) | ORAL | Status: DC

## 2013-06-22 MED ORDER — AMLODIPINE BESYLATE 5 MG PO TABS: 5.0000 mg | ORAL_TABLET | Freq: Every day | ORAL | Status: DC

## 2013-06-22 MED ORDER — SODIUM CHLORIDE 0.9 % IJ SOLN: 10.0000 mL | INTRAMUSCULAR | Status: DC | PRN

## 2013-06-22 MED ORDER — AMLODIPINE BESYLATE 5 MG PO TABS
5.0000 mg | ORAL_TABLET | Freq: Every day | ORAL | Status: DC
  Administered 2013-06-22: 5 mg via ORAL
  Filled 2013-06-22: qty 1

## 2013-06-22 MED ORDER — HEPARIN SOD (PORK) LOCK FLUSH 100 UNIT/ML IV SOLN
250.0000 [IU] | INTRAVENOUS | Status: AC | PRN
  Administered 2013-06-22: 250 [IU]

## 2013-06-22 MED ORDER — POLYETHYLENE GLYCOL 3350 17 G PO PACK: 17.0000 g | PACK | Freq: Every day | ORAL | Status: DC | PRN

## 2013-06-22 NOTE — Progress Notes (Signed)
Peripherally Inserted Central Catheter/Midline Placement  The IV Nurse has discussed with the patient and/or persons authorized to consent for the patient, the purpose of this procedure and the potential benefits and risks involved with this procedure.  The benefits include less needle sticks, lab draws from the catheter and patient may be discharged home with the catheter.  Risks include, but not limited to, infection, bleeding, blood clot (thrombus formation), and puncture of an artery; nerve damage and irregular heat beat.  Alternatives to this procedure were also discussed.  PICC/Midline Placement Documentation        Jaime [name] 06/22/2013, 9:33 AM

## 2013-06-22 NOTE — Progress Notes (Signed)
Pt's b/p did not improve after medication- MD notified. Awaiting orders.

## 2013-06-22 NOTE — Discharge Summary (Signed)
PATIENT DETAILS Name: Jaime [name] Age: 57 y.o. Sex: male Date of Birth: Nov 30, 1956 MRN: [id number]. Admit Date: 06/16/2013 Admitting Physician: Marinda Elk, MD PCP:No PCP Per Patient  Recommendations for Outpatient Follow-up:  1. Orthopedics followup with Dr. Lajoyce Corners in one week 2. Check hemoglobin A1c in the next 3-6 months and adjust insulin dosing  PRIMARY DISCHARGE DIAGNOSIS:  Principal Problem:   Cellulitis and abscess of foot, except toes Active Problems:   Diabetes mellitus, type II   DKA, type 2   Hypertension     PAST MEDICAL HISTORY: Past Medical History  Diagnosis Date  . Diabetes mellitus without complication     DISCHARGE MEDICATIONS:   Medication List    STOP taking these medications       ibuprofen 200 MG tablet  Commonly known as:  ADVIL,MOTRIN      TAKE these medications       amLODipine 5 MG tablet  Commonly known as:  NORVASC  Take 1 tablet (5 mg total) by mouth daily.     aspirin EC 81 MG tablet  Take 81 mg by mouth daily.     ceFAZolin 2-3 GM-% Solr  Commonly known as:  ANCEF  Inject 50 mLs (2 g total) into the vein every 8 (eight) hours.     fluconazole 100 MG tablet  Commonly known as:  DIFLUCAN  Take 1 tablet (100 mg total) by mouth daily.     Insulin Pen Needle 32G X 8 MM Misc  Use for insulin injections     LEVEMIR FLEXPEN 100 UNIT/ML Sopn  Generic drug:  Insulin Detemir  Inject 30 Units into the skin daily.     lisinopril 40 MG tablet  Commonly known as:  PRINIVIL,ZESTRIL  Take 1 tablet (40 mg total) by mouth daily.     metFORMIN 500 MG tablet  Commonly known as:  GLUCOPHAGE  Take 1 tablet (500 mg total) by mouth 2 (two) times daily with a meal.     oxyCODONE 5 MG immediate release tablet  Commonly known as:  Oxy IR/ROXICODONE  Take 1 tablet (5 mg total) by mouth every 6 (six) hours as needed.     polyethylene glycol packet  Commonly known as:  MIRALAX / GLYCOLAX  Take 17 g by mouth daily as needed.         ALLERGIES:  No Known Allergies  BRIEF HPI:  See H&P, Labs, Consult and Test reports for all details in brief, patient is a 57 year old Caucasian male with a past medical history of diabetes not on any medication he presented with left foot pain and swelling, he was also found to be in diabetic ketoacidosis.  CONSULTATIONS:   ID and orthopedic surgery  PERTINENT RADIOLOGIC STUDIES: Mr Foot Left W Wo Contrast  06/18/2013   *RADIOLOGY REPORT*  Clinical Data: Left foot swelling, erythema and pain.  Recent twisting injury.  Evaluate for abscess or osteomyelitis.  MRI OF THE LEFT FOREFOOT WITHOUT AND WITH CONTRAST  Technique:  Multiplanar, multisequence MR imaging was performed both before and after administration of intravenous contrast.  Contrast: 20mL MULTIHANCE GADOBENATE DIMEGLUMINE 529 MG/ML IV SOLN  Comparison: Left foot radiographs 06/16/2013.  Findings: The entire foot is imaged.  There is extensive diffuse subcutaneous edema throughout the ankle and foot.  There is diffuse T2 hyperintensity throughout the foot musculature.  Ill-defined fluid is present within the plantar subcutaneous fat of the forefoot, and this wraps around the fourth metatarsal phalangeal joint and extends into the proximal second through  fifth toes. Postcontrast, there is peripheral enhancement of these ill-defined fluid collections which are quite irregular in shape and difficult to measure. No focal fluid collections are identified proximal to the Lisfranc joint.  Inflammatory changes within the plantar aspect of the forefoot involve the distal aspect of the plantar fascia.  In addition, there is some extensor tenosynovitis, especially of the fourth ray.  The inflammatory changes are in close proximity to the fourth and fifth metatarsal phalangeal joints.  Low-level marrow edema and enhancement are present within the fourth metatarsal head.  No cortical destruction is apparent on T1-weighted images.  No other suspicious  marrow lesions are seen.  There are no significant neuropathic changes within the midfoot.  Plantar calcaneal spurring is noted.  IMPRESSION:  1.  Extensive inflammatory changes throughout the foot, especially in the lateral aspect of the forefoot as described.  Ill-defined peripherally enhancing fluid collections extend from the plantar subcutaneous fat of the forefoot into the toes and wrap around the fourth and fifth metatarsal phalangeal joints. These fluid collections are worrisome for early abscesses. 2.  Associated tenosynovitis, especially of the fourth toe extensor tendon. 3.  Marrow edema and enhancement within the fourth metatarsal head concerning for early osteomyelitis. 4.  No focal fluid collections or suspicious osseous findings are demonstrated within the midfoot or hind foot.   Original Report Authenticated By: Carey Bullocks, M.D.   Dg Foot Complete Left  06/16/2013   *RADIOLOGY REPORT*  Clinical Data: Left foot pain, swelling and redness following an injury 4 days ago.  LEFT FOOT - COMPLETE 3+ VIEW  Comparison: None.  Findings: Inferior and posterior calcaneal spur formation.  Dorsal tarsal spur formation.  Talotibial degenerative spur formation. Mild diffuse distal soft tissue swelling.  No fracture or dislocation.  IMPRESSION:  1.  No fracture. 2.  Degenerative changes, as described above.   Original Report Authenticated By: Beckie Salts, M.D.     PERTINENT LAB RESULTS: CBC:  Recent Labs  06/20/13 0513 06/21/13 0500  WBC 12.7* 9.9  HGB 9.6* 10.0*  HCT 29.1* 30.4*  PLT 248 268   CMET CMP     Component Value Date/Time   NA 136 06/22/2013 0520   K 3.2* 06/22/2013 0520   CL 103 06/22/2013 0520   CO2 26 06/22/2013 0520   GLUCOSE 135* 06/22/2013 0520   BUN 9 06/22/2013 0520   CREATININE 0.87 06/22/2013 0520   CALCIUM 8.6 06/22/2013 0520   PROT 6.7 06/20/2013 0513   ALBUMIN 1.9* 06/20/2013 0513   AST 13 06/20/2013 0513   ALT 11 06/20/2013 0513   ALKPHOS 121* 06/20/2013 0513    BILITOT 0.5 06/20/2013 0513   GFRNONAA >90 06/22/2013 0520   GFRAA >90 06/22/2013 0520    GFR Estimated Creatinine Clearance: 135 ml/min (by C-G formula based on Cr of 0.87). No results found for this basename: LIPASE, AMYLASE,  in the last 72 hours No results found for this basename: CKTOTAL, CKMB, CKMBINDEX, TROPONINI,  in the last 72 hours No components found with this basename: POCBNP,  No results found for this basename: DDIMER,  in the last 72 hours No results found for this basename: HGBA1C,  in the last 72 hours No results found for this basename: CHOL, HDL, LDLCALC, TRIG, CHOLHDL, LDLDIRECT,  in the last 72 hours No results found for this basename: TSH, T4TOTAL, FREET3, T3FREE, THYROIDAB,  in the last 72 hours No results found for this basename: VITAMINB12, FOLATE, FERRITIN, TIBC, IRON, RETICCTPCT,  in the last 72 hours  Coags: No results found for this basename: PT, INR,  in the last 72 hours Microbiology: Recent Results (from the past 240 hour(s))  CULTURE, BLOOD (ROUTINE X 2)     Status: None   Collection Time    06/16/13 10:58 PM      Result Value Range Status   Specimen Description BLOOD HAND LEFT   Final   Special Requests BOTTLES DRAWN AEROBIC ONLY 5CC   Final   Culture  Setup Time 06/17/2013 10:50   Final   Culture     Final   Value:        BLOOD CULTURE RECEIVED NO GROWTH TO DATE CULTURE WILL BE HELD FOR 5 DAYS BEFORE ISSUING A FINAL NEGATIVE REPORT   Report Status PENDING   Incomplete  CULTURE, BLOOD (ROUTINE X 2)     Status: None   Collection Time    06/16/13 11:08 PM      Result Value Range Status   Specimen Description BLOOD ARM RIGHT   Final   Special Requests BOTTLES DRAWN AEROBIC ONLY 2CC   Final   Culture  Setup Time 06/17/2013 10:50   Final   Culture     Final   Value:        BLOOD CULTURE RECEIVED NO GROWTH TO DATE CULTURE WILL BE HELD FOR 5 DAYS BEFORE ISSUING A FINAL NEGATIVE REPORT   Report Status PENDING   Incomplete  URINE CULTURE     Status: None    Collection Time    06/17/13  6:37 AM      Result Value Range Status   Specimen Description URINE, RANDOM   Final   Special Requests NONE   Final   Culture  Setup Time 06/17/2013 13:56   Final   Colony Count 40,000 COLONIES/ML   Final   Culture YEAST   Final   Report Status 06/18/2013 FINAL   Final  SURGICAL PCR SCREEN     Status: None   Collection Time    06/19/13  7:54 AM      Result Value Range Status   MRSA, PCR NEGATIVE  NEGATIVE Final   Staphylococcus aureus NEGATIVE  NEGATIVE Final   Comment:            The Xpert SA Assay (FDA     approved for NASAL specimens     in patients over 70 years of age),     is one component of     a comprehensive surveillance     program.  Test performance has     been validated by [employer] for patients greater     than or equal to 27 year old.     It is not intended     to diagnose infection nor to     guide or monitor treatment.  CULTURE, ROUTINE-ABSCESS     Status: None   Collection Time    06/19/13  2:03 PM      Result Value Range Status   Specimen Description ABSCESS LEFT FOOT   Final   Special Requests DORSAL PT ON VANCO AND ZOSYN   Final   Gram Stain     Final   Value: FEW WBC PRESENT, PREDOMINANTLY MONONUCLEAR     NO SQUAMOUS EPITHELIAL CELLS SEEN     RARE GRAM POSITIVE COCCI     IN PAIRS IN CLUSTERS   Culture     Final   Value: MODERATE STAPHYLOCOCCUS AUREUS     Note: RIFAMPIN  AND GENTAMICIN SHOULD NOT BE USED AS SINGLE DRUGS FOR TREATMENT OF STAPH INFECTIONS.   Report Status 06/22/2013 FINAL   Final   Organism ID, Bacteria STAPHYLOCOCCUS AUREUS   Final  ANAEROBIC CULTURE     Status: None   Collection Time    06/19/13  2:03 PM      Result Value Range Status   Specimen Description ABSCESS LEFT FOOT   Final   Special Requests DORSAL PT ON VANCO AND ZOSYN NO1   Final   Gram Stain     Final   Value: FEW WBC PRESENT, PREDOMINANTLY MONONUCLEAR     NO SQUAMOUS EPITHELIAL CELLS SEEN     RARE GRAM POSITIVE COCCI     IN  PAIRS IN CLUSTERS   Culture     Final   Value: NO ANAEROBES ISOLATED; CULTURE IN PROGRESS FOR 5 DAYS   Report Status PENDING   Incomplete  CULTURE, ROUTINE-ABSCESS     Status: None   Collection Time    06/19/13  2:05 PM      Result Value Range Status   Specimen Description ABSCESS LEFT FOOT   Final   Special Requests NO2 PLANTAR PT ON VANCO AND ZOSYN   Final   Gram Stain     Final   Value: FEW WBC PRESENT, PREDOMINANTLY MONONUCLEAR     NO SQUAMOUS EPITHELIAL CELLS SEEN     NO ORGANISMS SEEN   Culture     Final   Value: MODERATE STAPHYLOCOCCUS AUREUS     Note: RIFAMPIN AND GENTAMICIN SHOULD NOT BE USED AS SINGLE DRUGS FOR TREATMENT OF STAPH INFECTIONS.   Report Status 06/22/2013 FINAL   Final   Organism ID, Bacteria STAPHYLOCOCCUS AUREUS   Final  ANAEROBIC CULTURE     Status: None   Collection Time    06/19/13  2:05 PM      Result Value Range Status   Specimen Description ABSCESS LEFT FOOT   Final   Special Requests NO2 PLANTAR PT ON VANCO AND ZOSYN   Final   Gram Stain     Final   Value: FEW WBC PRESENT, PREDOMINANTLY MONONUCLEAR     NO SQUAMOUS EPITHELIAL CELLS SEEN     NO ORGANISMS SEEN   Culture     Final   Value: NO ANAEROBES ISOLATED; CULTURE IN PROGRESS FOR 5 DAYS   Report Status PENDING   Incomplete     BRIEF HOSPITAL COURSE:  Cellulitis of Left foot with abscess  - Patient was admitted and started on empiric vancomycin. Because of no clinical improvement Zosyn was added. Clinically, deep tissue infection was suspected. MRI of the foot was obtained, which showed diffuse edema throughout the ankle and the foot, which showed ill-defined fluid within the plantar subcutaneous fat, with peripheral enhancement of these ill-defined fluid collections. Abscess was likely, Dr. Lajoyce Corners was consulted, who did a bedside debridement of the blisters that were present on the plantar surface of the patient's foot. Plans were to continue with IV antibiotics to see if patient would improve  following bedside debridement, unfortunately there was no improvement, patient was taken to the OR on 6/20, where 2 large abscess were found, one of them was deep to the plantar fascia. This was drained, postoperatively patient did not have any major issues. Intraoperative cultures were obtained, these are now positive for MSSA. - Orthopedics who recommended intravenous antibiotics for a period of 4 weeks,on day of discharge, a PICC line was placed and infectious disease was consulted. Current plans  are to continue with Ancef on discharge and have patient followup with Dr. Lajoyce Corners in 1 week and at the infectious disease clinic as well. - Wound Care recommendations from Dr. Lajoyce Corners are for the following:  Wash left foot in dial soap and water daily applied dry dressing daily. Strict nonweightbearing left foot. Keep left foot elevated above the heart at all times.  DKA  -resolved, patient was admitted and placed on a insulin drip. Upon closing of his anion gap, he was transitioned to subcutaneous insulin.  UTI  -urine culture on 6/18 positive for yeast-on Day 2 of Fluconazole, continue for a total of 7 days and then stop.  Diabetes Mellitus Type 2  -c/w 30 units of Levemir and SSI  -diabetic education done  -will start Metformin on discharge, but anticipate patient will be discharged on Levemir  - A1C 10.2   Hypokalemia  -2/2 to DKA/Insulin gtt  - This was repleted  Hypertension  - Was started on lisinopril, on day of discharge, we'll Increase Lisinopril to 40 mg daily and also placed on amlodipine 5 mg daily. Further optimization will need to be done in the outpatient setting.   TODAY-DAY OF DISCHARGE:  Subjective:   Jaime [name] today has no headache,no chest abdominal pain,no new weakness tingling or numbness, feels much better wants to go home today.   Objective:   Blood pressure 164/69, pulse 77, temperature 97.7 F (36.5 C), temperature source Oral, resp. rate 18, height 6\' 4"   (1.93 m), weight 127.5 kg (281 lb 1.4 oz), SpO2 96.00%.  Intake/Output Summary (Last 24 hours) at 06/22/13 1106 Last data filed at 06/22/13 0654  Gross per 24 hour  Intake   1060 ml  Output    775 ml  Net    285 ml   Filed Weights   06/21/13 0457 06/22/13 0500 06/22/13 0548  Weight: 127.5 kg (281 lb 1.4 oz) 127.461 kg (281 lb) 127.5 kg (281 lb 1.4 oz)    Exam Awake Alert, Oriented *3, No new F.N deficits, Normal affect [place].AT,PERRAL Supple Neck,No JVD, No cervical lymphadenopathy appriciated.  Symmetrical Chest wall movement, Good air movement bilaterally, CTAB RRR,No Gallops,Rubs or new Murmurs, No Parasternal Heave +ve B.Sounds, Abd Soft, Non tender, No organomegaly appriciated, No rebound -guarding or rigidity. No Cyanosis, Clubbing or edema, No new Rash or bruise  DISCHARGE CONDITION: Stable  DISPOSITION: Home with home health services  DISCHARGE INSTRUCTIONS:    Activity:  As tolerated with Full fall precautions use walker/cane & assistance as needed  Diet recommendation: Diabetic Diet Heart Healthy diet  Wound Care Wash left foot in dial soap and water daily applied dry dressing daily. Strict nonweightbearing left foot. Keep left foot elevated above the heart at all times.   Discharge Orders   Future Orders Complete By Expires     Call MD for:  redness, tenderness, or signs of infection (pain, swelling, redness, odor or green/yellow discharge around incision site)  As directed     Call MD for:  severe uncontrolled pain  As directed     Call MD for:  temperature >100.4  As directed     Diet - low sodium heart healthy  As directed     Diet Carb Modified  As directed     Discharge wound care:  As directed     Comments:      Wash left foot in dial soap and water daily applied dry dressing daily. Strict nonweightbearing left foot. Keep left foot elevated above the  heart at all times.    Increase activity slowly  As directed     Non weight bearing  As directed      Scheduling Instructions:      Left foot    Other Restrictions  As directed     Comments:      Strict nonweightbearing left foot. Keep left foot elevated above the heart at all times.       Follow-up Information   Follow up with DUDA,MARCUS V, MD In 1 week.   Contact information:   [address] [place] [address] [phone]       Follow up with Judyann Munson, MD. Schedule an appointment as soon as possible for a visit in 3 weeks.   [address] [place] [address] [phone]       Schedule an appointment as soon as possible for a visit in 2 weeks to follow up.   Contact information:   Follow with  primary care practitioner of the choice      Total Time spent on discharge equals 45 minutes.  SignedJeoffrey Massed 06/22/2013 11:06 AM

## 2013-06-22 NOTE — Consult Note (Signed)
[facility]  Total days of antibiotics 7        Day 7 vanco        Day 5 piptazo               Reason for Consult: MSSA deep tissue infection    Referring Physician: ghimire  Principal Problem:   Cellulitis and abscess of foot, except toes Active Problems:   Diabetes mellitus, type II   DKA, type 2    HPI: Jaime [name] is a 57 y.o. male with DM, who injured his foot 4-5 days prior to admission by twisting his ankle. Started to steadily see increase in swelling, erythema to whole foot, tender to ambulate plus would have episodic chills but no fevers. Never had erythema extend above ankle. On admit, he had leukocytosis of 15.2 with left shift. He was empirically started on vanco and piptazo. Had mri of foot on 6/18 which found Extensive inflammatory changes throughout the foot, especially in the lateral aspect of the forefoot as described. Ill-defined peripherally enhancing fluid collections extend from the plantar subcutaneous fat of the forefoot into the toes and wrap around the fourth and fifth metatarsal phalangeal joints. These fluid collections are worrisome for early abscesses. He underwent I x D on 6/20, Or cultures identified MSSA. A picc line was placed for Iv abtx estimated at 4 wks while his foot is healing from surgery. Cefazolin started on 6/23     Past Medical History  Diagnosis Date  . Diabetes mellitus without complication     Allergies: No Known Allergies  MEDICATIONS: . amLODipine  5 mg Oral Daily  . aspirin EC  81 mg Oral Daily  .  ceFAZolin (ANCEF) IV  2 g Intravenous Q8H  . enoxaparin (LOVENOX) injection  60 mg Subcutaneous Q24H  . fluconazole  100 mg Oral Daily  . insulin aspart  0-9 Units Subcutaneous TID WC  . insulin detemir  30 Units Subcutaneous Daily  . lisinopril  40 mg Oral Daily    History  Substance Use Topics  . Smoking status: Never Smoker   . Smokeless tobacco: Not on file  . Alcohol Use: Yes     Comment:  social drinker    Family History  Problem Relation Age of Onset  . Heart failure Mother   . Heart attack Mother   . Leukemia Father      Review of Systems  Constitutional: Negative for fever, chills, diaphoresis, activity change, appetite change, fatigue and unexpected weight change.  HENT: Negative for congestion, sore throat, rhinorrhea, sneezing, trouble swallowing and sinus pressure.  Eyes: Negative for photophobia and visual disturbance.  Respiratory: Negative for cough, chest tightness, shortness of breath, wheezing and stridor.  Cardiovascular: Negative for chest pain, palpitations and leg swelling.  Gastrointestinal: Negative for nausea, vomiting, abdominal pain, diarrhea, constipation, blood in stool, abdominal distention and anal bleeding.  Genitourinary: Negative for dysuria, hematuria, flank pain and difficulty urinating.  Musculoskeletal: Negative for myalgias, back pain, joint swelling, arthralgias and gait problem.  Skin: per hpi Neurological: Negative for dizziness, tremors, weakness and light-headedness.  Hematological: Negative for adenopathy. Does not bruise/bleed easily.  Psychiatric/Behavioral: Negative for behavioral problems, confusion, sleep disturbance, dysphoric mood, decreased concentration and agitation.     OBJECTIVE: Temp:  [97.7 F (36.5 C)-99.1 F (37.3 C)] 97.7 F (36.5 C) (06/23 0548) Pulse Rate:  [73-80] 77 (06/23 0548) Resp:  [18] 18 (06/23 0548) BP: (162-173)/(69-90) 164/69 mmHg (06/23 1001) SpO2:  [96 %]  96 % (06/22 2056) Weight:  [281 lb (127.461 kg)-281 lb 1.4 oz (127.5 kg)] 281 lb 1.4 oz (127.5 kg) (06/23 0548) Physical Exam  Constitutional: He is oriented to person, place, and time. He appears well-developed and well-nourished. No distress.  HENT:  Mouth/Throat: Oropharynx is clear and moist. No oropharyngeal exudate.  Cardiovascular: Normal rate, regular rhythm and normal heart sounds. Exam reveals no gallop and no friction rub.  No  murmur heard.  Pulmonary/Chest: Effort normal and breath sounds normal. No respiratory distress. He has no wheezes.  Abdominal: Soft. Bowel sounds are normal. He exhibits no distension. There is no tenderness.  Lymphadenopathy:  no cervical adenopathy.  Ext: left  Leg and foot slightly larger than right at baseline, everythema to dorsum of foot and plantar aspect has receeded based upon outline. Some exfoliation of skin. 2 incisions noted 1 dorsum and 1 plantar aspect from surgery. Some fibrinous exudate on plantar aspect by 5th digit Neurological: He is alert and oriented to person, place, and time.  Skin: Skin is warm and dry. No rash noted. No erythema.  Psychiatric: He has a normal mood and affect. His behavior is normal.    LABS: Results for orders placed during the hospital encounter of 06/16/13 (from the past 48 hour(s))  GLUCOSE, CAPILLARY     Status: Abnormal   Collection Time    06/20/13  4:43 PM      Result Value Range   Glucose-Capillary 122 (*) 70 - 99 mg/dL  GLUCOSE, CAPILLARY     Status: Abnormal   Collection Time    06/20/13  9:10 PM      Result Value Range   Glucose-Capillary 124 (*) 70 - 99 mg/dL   Comment 1 Notify RN    CBC     Status: Abnormal   Collection Time    06/21/13  5:00 AM      Result Value Range   WBC 9.9  4.0 - 10.5 K/uL   RBC 3.52 (*) 4.22 - 5.81 MIL/uL   Hemoglobin 10.0 (*) 13.0 - 17.0 g/dL   HCT 95.6 (*) 21.3 - 08.6 %   MCV 86.4  78.0 - 100.0 fL   MCH 28.4  26.0 - 34.0 pg   MCHC 32.9  30.0 - 36.0 g/dL   RDW 57.8  46.9 - 62.9 %   Platelets 268  150 - 400 K/uL  BASIC METABOLIC PANEL     Status: Abnormal   Collection Time    06/21/13  5:00 AM      Result Value Range   Sodium 136  135 - 145 mEq/L   Potassium 3.1 (*) 3.5 - 5.1 mEq/L   Chloride 100  96 - 112 mEq/L   CO2 27  19 - 32 mEq/L   Glucose, Bld 121 (*) 70 - 99 mg/dL   BUN 10  6 - 23 mg/dL   Creatinine, Ser 5.28  0.50 - 1.35 mg/dL   Calcium 8.5  8.4 - 41.3 mg/dL   GFR calc non Af Amer  >90  >90 mL/min   GFR calc Af Amer >90  >90 mL/min   Comment:            The eGFR has been calculated     using the CKD EPI equation.     This calculation has not been     validated in all clinical     situations.     eGFR's persistently     <90 mL/min signify  possible Chronic Kidney Disease.  MAGNESIUM     Status: None   Collection Time    06/21/13  5:00 AM      Result Value Range   Magnesium 2.0  1.5 - 2.5 mg/dL  SEDIMENTATION RATE     Status: Abnormal   Collection Time    06/21/13  5:00 AM      Result Value Range   Sed Rate 121 (*) 0 - 16 mm/hr  GLUCOSE, CAPILLARY     Status: Abnormal   Collection Time    06/21/13  7:56 AM      Result Value Range   Glucose-Capillary 131 (*) 70 - 99 mg/dL  GLUCOSE, CAPILLARY     Status: Abnormal   Collection Time    06/21/13 11:28 AM      Result Value Range   Glucose-Capillary 162 (*) 70 - 99 mg/dL  GLUCOSE, CAPILLARY     Status: Abnormal   Collection Time    06/21/13  4:54 PM      Result Value Range   Glucose-Capillary 158 (*) 70 - 99 mg/dL  GLUCOSE, CAPILLARY     Status: Abnormal   Collection Time    06/21/13 10:14 PM      Result Value Range   Glucose-Capillary 139 (*) 70 - 99 mg/dL   Comment 1 Notify RN    BASIC METABOLIC PANEL     Status: Abnormal   Collection Time    06/22/13  5:20 AM      Result Value Range   Sodium 136  135 - 145 mEq/L   Potassium 3.2 (*) 3.5 - 5.1 mEq/L   Chloride 103  96 - 112 mEq/L   CO2 26  19 - 32 mEq/L   Glucose, Bld 135 (*) 70 - 99 mg/dL   BUN 9  6 - 23 mg/dL   Creatinine, Ser 4.54  0.50 - 1.35 mg/dL   Calcium 8.6  8.4 - 09.8 mg/dL   GFR calc non Af Amer >90  >90 mL/min   GFR calc Af Amer >90  >90 mL/min   Comment:            The eGFR has been calculated     using the CKD EPI equation.     This calculation has not been     validated in all clinical     situations.     eGFR's persistently     <90 mL/min signify     possible Chronic Kidney Disease.  MAGNESIUM     Status: None    Collection Time    06/22/13  5:20 AM      Result Value Range   Magnesium 1.9  1.5 - 2.5 mg/dL  GLUCOSE, CAPILLARY     Status: Abnormal   Collection Time    06/22/13  7:54 AM      Result Value Range   Glucose-Capillary 146 (*) 70 - 99 mg/dL   Comment 1 Documented in Chart     Comment 2 Notify RN    GLUCOSE, CAPILLARY     Status: Abnormal   Collection Time    06/22/13 10:11 AM      Result Value Range   Glucose-Capillary 164 (*) 70 - 99 mg/dL  GLUCOSE, CAPILLARY     Status: Abnormal   Collection Time    06/22/13 12:43 PM      Result Value Range   Glucose-Capillary 157 (*) 70 - 99 mg/dL    MICRO: 1/19 wound cx: MSSa  IMAGING:  6/18 mri Extensive inflammatory changes throughout the foot, especially  in the lateral aspect of the forefoot as described. Ill-defined  peripherally enhancing fluid collections extend from the plantar  subcutaneous fat of the forefoot into the toes and wrap around the  fourth and fifth metatarsal phalangeal joints. These fluid  collections are worrisome for early abscesses.  2. Associated tenosynovitis, especially of the fourth toe extensor  tendon.  3. Marrow edema and enhancement within the fourth metatarsal head  concerning for early osteomyelitis.  4. No focal fluid collections or suspicious osseous findings are  demonstrated within the midfoot or hind foot.  Assessment/Plan:  57yo M with DM presents with left foot cellulitis and deep tissue infection s/p debridement. Wound cx + MSSA  -continue to treat with IV cefazolin 2gm IV Q8hr for next 4 wks. Would use pod #1, 6/21 as day #1 of 28 day course - we will see him back in 2 wks in [facility] and at 4 wks to ensure he is doing well and recovering appropriately from his infection - would recommend home health to check weekly bmp and do weekly dressing changes on picc line  Thank you for consultation Aram Beecham B. Drue Second MD MPH [facility] [phone]

## 2013-06-22 NOTE — Progress Notes (Signed)
Jaime [name] to be D/C'd Home per MD order.  Discussed with the patient and all questions fully answered.    Medication List    STOP taking these medications       ibuprofen 200 MG tablet  Commonly known as:  ADVIL,MOTRIN      TAKE these medications       amLODipine 5 MG tablet  Commonly known as:  NORVASC  Take 1 tablet (5 mg total) by mouth daily.     aspirin EC 81 MG tablet  Take 81 mg by mouth daily.     ceFAZolin 2-3 GM-% Solr  Commonly known as:  ANCEF  Inject 50 mLs (2 g total) into the vein every 8 (eight) hours.     fluconazole 100 MG tablet  Commonly known as:  DIFLUCAN  Take 1 tablet (100 mg total) by mouth daily.     Insulin Pen Needle 32G X 8 MM Misc  Use for insulin injections     LEVEMIR FLEXPEN 100 UNIT/ML Sopn  Generic drug:  Insulin Detemir  Inject 30 Units into the skin daily.     lisinopril 40 MG tablet  Commonly known as:  PRINIVIL,ZESTRIL  Take 1 tablet (40 mg total) by mouth daily.     metFORMIN 500 MG tablet  Commonly known as:  GLUCOPHAGE  Take 1 tablet (500 mg total) by mouth 2 (two) times daily with a meal.     oxyCODONE 5 MG immediate release tablet  Commonly known as:  Oxy IR/ROXICODONE  Take 1 tablet (5 mg total) by mouth every 6 (six) hours as needed.     polyethylene glycol packet  Commonly known as:  MIRALAX / GLYCOLAX  Take 17 g by mouth daily as needed.        VVS, Skin clean, dry and intact without evidence of skin break down, no evidence of skin tears noted. IV catheter discontinued intact. Site without signs and symptoms of complications. Dressing and pressure applied.  An After Visit Summary was printed and given to the patient. Patient escorted via WC, and D/C home via private auto.  [name] 06/22/2013 11:18 AM

## 2013-06-22 NOTE — Progress Notes (Signed)
   CARE MANAGEMENT NOTE 06/22/2013  Patient:  Jaime [name], Jaime [name]   Account Number:  [id number]  Date Initiated:  06/17/2013  Documentation initiated by:  Letha Cape  Subjective/Objective Assessment:   dx cellulitia/abscess  admit- lives with spouse.  pta indep.     Action/Plan:   Anticipated DC Date:  06/22/2013   Anticipated DC Plan:  HOME W HOME HEALTH SERVICES      DC Planning Services  CM consult      [facility] Choice  HOME HEALTH   Choice offered to / List presented to:  C-1 Patient        HH arranged  IV Antibiotics  HH-2 PT  HH-1 RN      [facility] agency  [facility]   Status of service:  Completed, signed off Medicare Important Message given?   (If response is "NO", the following Medicare IM given date fields will be blank) Date Medicare IM given:   Date Additional Medicare IM given:    Discharge Disposition:  HOME W HOME HEALTH SERVICES  Per UR Regulation:  Reviewed for med. necessity/level of care/duration of stay  If discussed at Long Length of Stay Meetings, dates discussed:    Comments:  06/22/2013 1100 NCM spoke to pt and offered choice for [facility]. Pt requested [facility] for [facility] RN and IV abx. NCM contacted [facility] rep with referral for scheduled dc home today. Isidoro Donning RN CCM Case Mgmt phone [phone]  06/17/13 10:48 Letha Cape RN, BSN [phone] patient lives with spouse, pta indep, patient has medication coverage and transportation at dc.  NCM will continue to follow for dc needs.

## 2013-06-22 NOTE — Anesthesia Postprocedure Evaluation (Signed)
Anesthesia Post Note  Patient: Jaime [name]  Procedure(s) Performed: Procedure(s) (LRB): IRRIGATION AND DEBRIDEMENT EXTREMITY (Left)  Anesthesia type: General  Patient location: PACU  Post pain: Pain level controlled and Adequate analgesia  Post assessment: Post-op Vital signs reviewed, Patient's Cardiovascular Status Stable, Respiratory Function Stable, Patent Airway and Pain level controlled  Last Vitals:  Filed Vitals:   06/22/13 0548  BP: 170/76  Pulse: 77  Temp: 36.5 C  Resp: 18    Post vital signs: Reviewed and stable  Level of consciousness: awake, alert  and oriented  Complications: No apparent anesthesia complications

## 2013-06-22 NOTE — Progress Notes (Signed)
Inpatient Diabetes Program Recommendations  AACE/ADA: New Consensus Statement on Inpatient Glycemic Control (2013)  Target Ranges:  Prepandial:   less than 140 mg/dL      Peak postprandial:   less than 180 mg/dL (1-2 hours)      Critically ill patients:  140 - 180 mg/dL    Reason for Visit: Patient being discharged home on insulin pen/Levemir. RN assisted patient patient in watching diabetes videos including video on insulin pen. Demonstrated use to patient and wife of insulin pen including 2 unit prime, dose, correct injection technique, holding pen in place for 10 seconds and proper disposal of sharps. Patient and wife verbalized understanding. Patient plans to get established and follow-up with [facility] PCP per wife. Encouraged them to call if CBG's consistently greater than 200 mg/dL. Also reviewed hypoglycemia treatment and signs and symptoms.   Note: Gave patient coupon for Levemir insulin with talking instructions on use of insulin pen.

## 2013-06-22 NOTE — Progress Notes (Signed)
ANTIBIOTIC CONSULT NOTE - FOLLOW UP  Pharmacy Consult for Vancomycin,  Lovenox  Indication: diabetic foot infection;  VTE prophylaxis (BMI >30)  No Known Allergies  Labs:  Recent Labs  06/20/13 0513 06/21/13 0500 06/22/13 0520  WBC 12.7* 9.9  --   HGB 9.6* 10.0*  --   PLT 248 268  --   CREATININE 0.84 0.90 0.87   Estimated Creatinine Clearance: 135 ml/min (by C-G formula based on Cr of 0.87).   Assessment: 82 YOM with diabetic foot, possible deep tissue infection on vancomycin and zosyn, No much improvement with IV abx, s/p I&D. WBC stable, afebrile.   Abscess culture with staph aureus -- sensitivities pending  Goal of Therapy:  Vancomycin trough = 15 to 20 mcg / dl VTE prophylaxis dose of Lovenox  Plan:  - Continue Lovenox 60 mg SQ q24hrs (~ 0.5 mg/kg/day). - Continue vancomycin 1 Gram iv Q 12 hours  Thank you. Okey Regal, PharmD [phone]  06/22/2013,8:42 AM

## 2013-06-22 NOTE — Progress Notes (Signed)
Patient ID: Jaime [name], male   DOB: November 14, 1956, 57 y.o.   MRN: [id number] Cultures positive for staph aureus sensitivities pending. I'll followup in the office in one week.

## 2013-06-22 NOTE — Progress Notes (Signed)
RN received orders to continue monitoring the pt and to contact MD when SPB is over 170.

## 2013-06-23 ENCOUNTER — Telehealth: Payer: Self-pay | Admitting: Licensed Clinical Social Worker

## 2013-06-23 ENCOUNTER — Encounter (HOSPITAL_COMMUNITY): Payer: Self-pay | Admitting: Orthopedic Surgery

## 2013-06-23 LAB — CULTURE, BLOOD (ROUTINE X 2): Culture: NO GROWTH

## 2013-06-23 NOTE — Telephone Encounter (Signed)
Can this patient be seen on 07/08/13 instead of next week? The discharge notes states that he should be followed up in 2 week with [place].   Per Dr. Drue Second she would like the patient double booked on her schedule next week. Front office will schedule.

## 2013-06-24 LAB — ANAEROBIC CULTURE

## 2013-06-30 ENCOUNTER — Inpatient Hospital Stay: Payer: 59 | Admitting: Internal Medicine

## 2013-06-30 ENCOUNTER — Other Ambulatory Visit (HOSPITAL_COMMUNITY): Payer: Self-pay | Admitting: Orthopedic Surgery

## 2013-06-30 ENCOUNTER — Encounter (HOSPITAL_COMMUNITY): Payer: Self-pay | Admitting: Pharmacy Technician

## 2013-07-01 ENCOUNTER — Encounter (HOSPITAL_COMMUNITY)
Admission: RE | Admit: 2013-07-01 | Discharge: 2013-07-01 | Disposition: A | Discharge status: Home / Self Care | Payer: 59 | Source: Ambulatory Visit | Attending: Orthopedic Surgery | Admitting: Orthopedic Surgery

## 2013-07-01 ENCOUNTER — Ambulatory Visit: Payer: 59 | Admitting: Family Medicine

## 2013-07-01 ENCOUNTER — Encounter (HOSPITAL_COMMUNITY)
Admission: RE | Admit: 2013-07-01 | Discharge: 2013-07-01 | Disposition: A | Discharge status: Home / Self Care | Payer: 59 | Source: Ambulatory Visit | Attending: Anesthesiology | Admitting: Anesthesiology

## 2013-07-01 ENCOUNTER — Encounter (HOSPITAL_COMMUNITY): Payer: Self-pay

## 2013-07-01 HISTORY — DX: Essential (primary) hypertension: I10

## 2013-07-01 LAB — PROTIME-INR
INR: 1.06 (ref 0.00–1.49)
Prothrombin Time: 13.6 seconds (ref 11.6–15.2)

## 2013-07-01 LAB — APTT: aPTT: 26 seconds (ref 24–37)

## 2013-07-01 MED ORDER — DEXTROSE 5 % IV SOLN
3.0000 g | INTRAVENOUS | Status: AC
  Administered 2013-07-02: 3 g via INTRAVENOUS
  Filled 2013-07-01: qty 3000

## 2013-07-01 NOTE — Progress Notes (Signed)
Pt denies SOB, chest pain and being under the care of a cardiologist. Pt STOP-BANG Assessment Tool results were sent to PCP, Dr. Crawford Givens of [place]. Pt made aware to stop taking Aspirin and herbal medications. Do not take any NSAIDs ie: Ibuprofen, Advil, Naproxen or any medication containing Aspirin. Pt advised to notify surgeon for permission to eat after midnight and take diabetic medications since his surgery is scheduled late and he is diabetic. Pt advised to follow pre-op instructions that were given in PAT until he contacted surgeon.

## 2013-07-01 NOTE — Progress Notes (Signed)
07/01/13 1420  OBSTRUCTIVE SLEEP APNEA  Have you ever been diagnosed with sleep apnea through a sleep study? No  Do you snore loudly (loud enough to be heard through closed doors)?  0  Do you often feel tired, fatigued, or sleepy during the daytime? 0  Has anyone observed you stop breathing during your sleep? 0  Do you have, or are you being treated for high blood pressure? 1  BMI more than 35 kg/m2? 0  Age over 58 years old? 1  Neck circumference greater than 40 cm/18 inches? 1  Gender: 1  Obstructive Sleep Apnea Score 4  Score 4 or greater  Results sent to PCP

## 2013-07-01 NOTE — Pre-Procedure Instructions (Signed)
Jaime [name]  07/01/2013   Your procedure is scheduled on: Thursday, July 02, 2013  Report to [facility] ( take [place] elevators to 3rd floor)  at call at 8:00 AM.  Call this number if you have problems the morning of surgery: [phone]   Remember:   Do not eat food or drink liquids after midnight.   Take these medicines the morning of surgery with A SIP OF WATER: amLODipine (NORVASC) 5 MG tablet  Stop taking Aspirin and herbal medications. Do not take any NSAIDs ie: Ibuprofen, Advil, Naproxen or any medication containing Aspirin.   Do not wear jewelry, make-up or nail polish.  Do not wear lotions, powders, or perfumes. You may wear deodorant.  Do not shave 48 hours prior to surgery. Men may shave face and neck.  Do not bring valuables to the hospital.  [facility] is not responsible  for any belongings or valuables.  Contacts, dentures or bridgework may not be worn into surgery.  Leave suitcase in the car. After surgery it may be brought to your room.  For patients admitted to the hospital, checkout time is 11:00 AM the day of discharge.   Patients discharged the day of surgery will not be allowed to drive home.  Name and phone number of your driver:   Special Instructions: Shower using CHG 2 nights before surgery and the night before surgery.  If you shower the day of surgery use CHG.  Use special wash - you have one bottle of CHG for all showers.  You should use approximately 1/3 of the bottle for each shower.   Please read over the following fact sheets that you were given: Pain Booklet, Coughing and Deep Breathing and Surgical Site Infection Prevention

## 2013-07-02 ENCOUNTER — Inpatient Hospital Stay (HOSPITAL_COMMUNITY)
Admission: RE | Admit: 2013-07-02 | Discharge: 2013-07-07 | DRG: 575 | Disposition: A | Discharge status: Home / Self Care | Payer: 59 | Source: Ambulatory Visit | Attending: Orthopedic Surgery | Admitting: Orthopedic Surgery

## 2013-07-02 ENCOUNTER — Encounter (HOSPITAL_COMMUNITY): Payer: Self-pay | Admitting: *Deleted

## 2013-07-02 ENCOUNTER — Ambulatory Visit (HOSPITAL_COMMUNITY): Payer: 59 | Admitting: *Deleted

## 2013-07-02 ENCOUNTER — Encounter (HOSPITAL_COMMUNITY)
Admission: RE | Disposition: A | Discharge status: Home / Self Care | Payer: Self-pay | Source: Ambulatory Visit | Attending: Orthopedic Surgery

## 2013-07-02 DIAGNOSIS — L02619 Cutaneous abscess of unspecified foot: Principal | ICD-10-CM | POA: Diagnosis present

## 2013-07-02 DIAGNOSIS — Z794 Long term (current) use of insulin: Secondary | ICD-10-CM

## 2013-07-02 DIAGNOSIS — E119 Type 2 diabetes mellitus without complications: Secondary | ICD-10-CM | POA: Diagnosis present

## 2013-07-02 DIAGNOSIS — L97509 Non-pressure chronic ulcer of other part of unspecified foot with unspecified severity: Secondary | ICD-10-CM | POA: Diagnosis present

## 2013-07-02 DIAGNOSIS — Z7982 Long term (current) use of aspirin: Secondary | ICD-10-CM

## 2013-07-02 HISTORY — PX: I & D EXTREMITY: SHX5045

## 2013-07-02 LAB — GLUCOSE, CAPILLARY
Glucose-Capillary: 135 mg/dL — ABNORMAL HIGH (ref 70–99)
Glucose-Capillary: 164 mg/dL — ABNORMAL HIGH (ref 70–99)

## 2013-07-02 SURGERY — IRRIGATION AND DEBRIDEMENT EXTREMITY
Anesthesia: General | Site: Foot | Laterality: Left | Wound class: Contaminated

## 2013-07-02 MED ORDER — PHENYLEPHRINE HCL 10 MG/ML IJ SOLN
INTRAMUSCULAR | Status: DC | PRN
  Administered 2013-07-02: 80 ug via INTRAVENOUS

## 2013-07-02 MED ORDER — INSULIN DETEMIR 100 UNIT/ML FLEXPEN: 30.0000 [IU] | PEN_INJECTOR | Freq: Every day | SUBCUTANEOUS | Status: DC

## 2013-07-02 MED ORDER — LACTATED RINGERS IV SOLN
INTRAVENOUS | Status: DC | PRN
  Administered 2013-07-02: 21:00:00 via INTRAVENOUS

## 2013-07-02 MED ORDER — SUFENTANIL CITRATE 50 MCG/ML IV SOLN
INTRAVENOUS | Status: DC | PRN
  Administered 2013-07-02: 10 ug via INTRAVENOUS

## 2013-07-02 MED ORDER — METFORMIN HCL 500 MG PO TABS
500.0000 mg | ORAL_TABLET | Freq: Two times a day (BID) | ORAL | Status: DC
  Administered 2013-07-03 – 2013-07-07 (×9): 500 mg via ORAL
  Filled 2013-07-02 (×11): qty 1

## 2013-07-02 MED ORDER — ONDANSETRON HCL 4 MG/2ML IJ SOLN
INTRAMUSCULAR | Status: DC | PRN
  Administered 2013-07-02: 4 mg via INTRAVENOUS

## 2013-07-02 MED ORDER — HYDROMORPHONE HCL PF 1 MG/ML IJ SOLN: 0.5000 mg | INTRAMUSCULAR | Status: DC | PRN

## 2013-07-02 MED ORDER — GENTAMICIN SULFATE 40 MG/ML IJ SOLN
INTRAMUSCULAR | Status: AC
  Filled 2013-07-02: qty 4

## 2013-07-02 MED ORDER — LIDOCAINE HCL (CARDIAC) 20 MG/ML IV SOLN
INTRAVENOUS | Status: DC | PRN
  Administered 2013-07-02: 100 mg via INTRAVENOUS

## 2013-07-02 MED ORDER — OXYCODONE HCL 5 MG/5ML PO SOLN: 5.0000 mg | Freq: Once | ORAL | Status: DC | PRN

## 2013-07-02 MED ORDER — ONDANSETRON HCL 4 MG/2ML IJ SOLN: 4.0000 mg | Freq: Once | INTRAMUSCULAR | Status: DC | PRN

## 2013-07-02 MED ORDER — MIDAZOLAM HCL 5 MG/5ML IJ SOLN
INTRAMUSCULAR | Status: DC | PRN
  Administered 2013-07-02: 2 mg via INTRAVENOUS

## 2013-07-02 MED ORDER — MEPERIDINE HCL 25 MG/ML IJ SOLN: 6.2500 mg | INTRAMUSCULAR | Status: DC | PRN

## 2013-07-02 MED ORDER — INSULIN DETEMIR 100 UNIT/ML ~~LOC~~ SOLN
30.0000 [IU] | Freq: Every day | SUBCUTANEOUS | Status: DC
  Administered 2013-07-03 – 2013-07-07 (×5): 30 [IU] via SUBCUTANEOUS
  Filled 2013-07-02 (×6): qty 0.3

## 2013-07-02 MED ORDER — ONDANSETRON HCL 4 MG/2ML IJ SOLN: 4.0000 mg | Freq: Four times a day (QID) | INTRAMUSCULAR | Status: DC | PRN

## 2013-07-02 MED ORDER — OXYCODONE HCL 5 MG PO TABS: 5.0000 mg | ORAL_TABLET | Freq: Once | ORAL | Status: DC | PRN

## 2013-07-02 MED ORDER — HYDROMORPHONE HCL PF 1 MG/ML IJ SOLN: 0.2500 mg | INTRAMUSCULAR | Status: DC | PRN

## 2013-07-02 MED ORDER — AMLODIPINE BESYLATE 5 MG PO TABS
5.0000 mg | ORAL_TABLET | Freq: Every day | ORAL | Status: DC
  Administered 2013-07-03 – 2013-07-07 (×5): 5 mg via ORAL
  Filled 2013-07-02 (×5): qty 1

## 2013-07-02 MED ORDER — SODIUM CHLORIDE 0.9 % IR SOLN
Status: DC | PRN
  Administered 2013-07-02: 3000 mL

## 2013-07-02 MED ORDER — PROPOFOL 10 MG/ML IV BOLUS
INTRAVENOUS | Status: DC | PRN
  Administered 2013-07-02: 150 mg via INTRAVENOUS

## 2013-07-02 MED ORDER — CEFAZOLIN SODIUM-DEXTROSE 2-3 GM-% IV SOLR
2.0000 g | Freq: Three times a day (TID) | INTRAVENOUS | Status: DC
  Administered 2013-07-03 – 2013-07-07 (×14): 2 g via INTRAVENOUS
  Filled 2013-07-02 (×16): qty 50

## 2013-07-02 MED ORDER — LISINOPRIL 40 MG PO TABS
40.0000 mg | ORAL_TABLET | Freq: Every day | ORAL | Status: DC
  Administered 2013-07-03 – 2013-07-07 (×5): 40 mg via ORAL
  Filled 2013-07-02 (×5): qty 1

## 2013-07-02 MED ORDER — HYDROCODONE-ACETAMINOPHEN 5-325 MG PO TABS
1.0000 | ORAL_TABLET | ORAL | Status: DC | PRN
  Administered 2013-07-03 – 2013-07-05 (×8): 1 via ORAL
  Administered 2013-07-06: 2 via ORAL
  Administered 2013-07-06 (×3): 1 via ORAL
  Administered 2013-07-07: 2 via ORAL
  Filled 2013-07-02: qty 2
  Filled 2013-07-02 (×4): qty 1
  Filled 2013-07-02 (×3): qty 2
  Filled 2013-07-02 (×5): qty 1

## 2013-07-02 MED ORDER — ONDANSETRON HCL 4 MG PO TABS: 4.0000 mg | ORAL_TABLET | Freq: Four times a day (QID) | ORAL | Status: DC | PRN

## 2013-07-02 MED ORDER — VANCOMYCIN HCL 500 MG IV SOLR
INTRAVENOUS | Status: DC | PRN
  Administered 2013-07-02: 500 mg

## 2013-07-02 MED ORDER — ASPIRIN EC 325 MG PO TBEC
325.0000 mg | DELAYED_RELEASE_TABLET | Freq: Every day | ORAL | Status: DC
  Administered 2013-07-03 – 2013-07-07 (×5): 325 mg via ORAL
  Filled 2013-07-02 (×5): qty 1

## 2013-07-02 MED ORDER — VANCOMYCIN HCL 500 MG IV SOLR
INTRAVENOUS | Status: AC
  Filled 2013-07-02: qty 500

## 2013-07-02 MED ORDER — OXYCODONE-ACETAMINOPHEN 5-325 MG PO TABS: 1.0000 | ORAL_TABLET | ORAL | Status: DC | PRN

## 2013-07-02 MED ORDER — METOCLOPRAMIDE HCL 10 MG PO TABS: 5.0000 mg | ORAL_TABLET | Freq: Three times a day (TID) | ORAL | Status: DC | PRN

## 2013-07-02 MED ORDER — METOCLOPRAMIDE HCL 5 MG/ML IJ SOLN: 5.0000 mg | Freq: Three times a day (TID) | INTRAMUSCULAR | Status: DC | PRN

## 2013-07-02 MED ORDER — ASPIRIN EC 81 MG PO TBEC
81.0000 mg | DELAYED_RELEASE_TABLET | Freq: Every day | ORAL | Status: DC
  Filled 2013-07-02 (×2): qty 1

## 2013-07-02 MED ORDER — SODIUM CHLORIDE 0.9 % IV SOLN
INTRAVENOUS | Status: DC
  Administered 2013-07-02: 23:00:00 via INTRAVENOUS

## 2013-07-02 SURGICAL SUPPLY — 45 items
BLADE SURG 10 STRL SS (BLADE) ×2 IMPLANT
BNDG COHESIVE 4X5 TAN STRL (GAUZE/BANDAGES/DRESSINGS) ×2 IMPLANT
BNDG COHESIVE 6X5 TAN STRL LF (GAUZE/BANDAGES/DRESSINGS) ×4 IMPLANT
BNDG GAUZE STRTCH 6 (GAUZE/BANDAGES/DRESSINGS) ×6 IMPLANT
CLOTH BEACON ORANGE TIMEOUT ST (SAFETY) ×2 IMPLANT
COTTON STERILE ROLL (GAUZE/BANDAGES/DRESSINGS) ×2 IMPLANT
COVER SURGICAL LIGHT HANDLE (MISCELLANEOUS) ×2 IMPLANT
CUFF TOURNIQUET SINGLE 18IN (TOURNIQUET CUFF) ×2 IMPLANT
CUFF TOURNIQUET SINGLE 24IN (TOURNIQUET CUFF) IMPLANT
CUFF TOURNIQUET SINGLE 34IN LL (TOURNIQUET CUFF) IMPLANT
CUFF TOURNIQUET SINGLE 44IN (TOURNIQUET CUFF) IMPLANT
DRAPE U-SHAPE 47X51 STRL (DRAPES) ×2 IMPLANT
DRSG ADAPTIC 3X8 NADH LF (GAUZE/BANDAGES/DRESSINGS) ×2 IMPLANT
DRSG VAC ATS SM SENSATRAC (GAUZE/BANDAGES/DRESSINGS) ×1 IMPLANT
DURAPREP 26ML APPLICATOR (WOUND CARE) ×2 IMPLANT
ELECT CAUTERY BLADE 6.4 (BLADE) IMPLANT
ELECT REM PT RETURN 9FT ADLT (ELECTROSURGICAL)
ELECTRODE REM PT RTRN 9FT ADLT (ELECTROSURGICAL) IMPLANT
GLOVE BIOGEL PI IND STRL 9 (GLOVE) ×1 IMPLANT
GLOVE BIOGEL PI INDICATOR 9 (GLOVE) ×1
GLOVE SURG ORTHO 9.0 STRL STRW (GLOVE) ×2 IMPLANT
GOWN PREVENTION PLUS XLARGE (GOWN DISPOSABLE) ×2 IMPLANT
GOWN SRG XL XLNG 56XLVL 4 (GOWN DISPOSABLE) ×1 IMPLANT
GOWN STRL NON-REIN XL XLG LVL4 (GOWN DISPOSABLE) ×2
HANDPIECE INTERPULSE COAX TIP (DISPOSABLE)
KIT BASIN OR (CUSTOM PROCEDURE TRAY) ×2 IMPLANT
KIT ROOM TURNOVER OR (KITS) ×2 IMPLANT
KIT STIMULAN RAPID CURE 5CC (Orthopedic Implant) ×1 IMPLANT
MANIFOLD NEPTUNE II (INSTRUMENTS) ×2 IMPLANT
NS IRRIG 1000ML POUR BTL (IV SOLUTION) ×2 IMPLANT
PACK ORTHO EXTREMITY (CUSTOM PROCEDURE TRAY) ×2 IMPLANT
PAD ARMBOARD 7.5X6 YLW CONV (MISCELLANEOUS) ×4 IMPLANT
PADDING CAST COTTON 6X4 STRL (CAST SUPPLIES) ×2 IMPLANT
SET HNDPC FAN SPRY TIP SCT (DISPOSABLE) IMPLANT
SPONGE GAUZE 4X4 12PLY (GAUZE/BANDAGES/DRESSINGS) ×2 IMPLANT
SPONGE LAP 18X18 X RAY DECT (DISPOSABLE) ×2 IMPLANT
STOCKINETTE IMPERVIOUS 9X36 MD (GAUZE/BANDAGES/DRESSINGS) ×2 IMPLANT
SUT ETHILON 2 0 PSLX (SUTURE) ×4 IMPLANT
TOWEL OR 17X24 6PK STRL BLUE (TOWEL DISPOSABLE) ×2 IMPLANT
TOWEL OR 17X26 10 PK STRL BLUE (TOWEL DISPOSABLE) ×2 IMPLANT
TUBE ANAEROBIC SPECIMEN COL (MISCELLANEOUS) IMPLANT
TUBE CONNECTING 12X1/4 (SUCTIONS) ×2 IMPLANT
UNDERPAD 30X30 INCONTINENT (UNDERPADS AND DIAPERS) ×2 IMPLANT
WATER STERILE IRR 1000ML POUR (IV SOLUTION) ×2 IMPLANT
YANKAUER SUCT BULB TIP NO VENT (SUCTIONS) ×2 IMPLANT

## 2013-07-02 NOTE — Op Note (Signed)
OPERATIVE REPORT  DATE OF SURGERY: 07/02/2013  PATIENT:  Jaime [name],  57 y.o. male  PRE-OPERATIVE DIAGNOSIS:  Abscess Left Foot  POST-OPERATIVE DIAGNOSIS:  Abcess Left Foot  PROCEDURE:  Procedure(s): IRRIGATION AND DEBRIDEMENT EXTREMITY with placement of antibiotic beads  and application of wound vac Local tissue rearrangement for plantar and dorsal wounds. Plantar wound 9 x 4 cm. Dorsal wound 6 x 3 cm. Fourth ray amputation. Local tissue rearrangement for wound closure with  9 x 5 cm of the wound 6 x 3 cm  SURGEON:  Surgeon(s): Nadara Mustard, MD  ANESTHESIA:   general  EBL:  Minimal ML  SPECIMEN:  No Specimen  TOURNIQUET:  * No tourniquets in log *  PROCEDURE DETAILS: Patient is a 57 year old gentleman diabetes status post irrigation debridement for multiple abscesses of the left foot. Patient presents at this time with recurrent abscesses to the left foot. Risks and benefits were discussed including potential for amputation. Patient states he understands and wished to proceed at this time. Description of procedure patient brought to the operating room first underwent cleansing with scrub and paint this was dried and then prepped using DuraPrep and draped into a sterile field. A dorsal incision was made over the necrotic wound plantar incision was made over necrotic wound over the fourth ray. Both knees necrotic wounds communicated with the fourth metatarsal and the fourth metatarsal and fourth toe were resected in one block of tissue. Wound was irrigated and debrided sharply with a Ronjair and back to healthy bleeding muscle there was no necrotic tissue no abscess pockets. The wound was packed open with antibiotic beads vancomycin and gentamicin and the wound was closed using 2-0 nylon. Attention was then focused on the plantar ulcer medial column. This was ellipsed out all necrotic tissue was removed this is debrided back to healthy viable granulation tissue. Muscle was excised.  The wound could not be completely closed the proximal and distal aspect of the wounds were rearranged 4 tissue closure with local tissue rearrangement. Antibiotic beads were placed deep in the wound. Both wounds were then covered with a VAC sponge set to -75 mm of mercury. This had a good suction. Patient was extubated taken to the PACU in stable condition.  PLAN OF CARE: Admit to inpatient   PATIENT DISPOSITION:  PACU - hemodynamically stable.   Nadara Mustard, MD 07/02/2013 10:21 PM

## 2013-07-02 NOTE — Anesthesia Preprocedure Evaluation (Signed)
Anesthesia Evaluation  Patient identified by MRN, date of birth, ID band Patient awake    Reviewed: Allergy & Precautions, H&P , NPO status , Patient's Chart, lab work & pertinent test results  Airway Mallampati: I TM Distance: >3 FB Neck ROM: Full    Dental   Pulmonary          Cardiovascular hypertension, Pt. on medications     Neuro/Psych    GI/Hepatic   Endo/Other  diabetes, Type 2  Renal/GU      Musculoskeletal   Abdominal   Peds  Hematology   Anesthesia Other Findings   Reproductive/Obstetrics                           Anesthesia Physical Anesthesia Plan  ASA: II  Anesthesia Plan: General   Post-op Pain Management:    Induction: Intravenous  Airway Management Planned: LMA  Additional Equipment:   Intra-op Plan:   Post-operative Plan: Extubation in OR  Informed Consent: I have reviewed the patients History and Physical, chart, labs and discussed the procedure including the risks, benefits and alternatives for the proposed anesthesia with the patient or authorized representative who has indicated his/her understanding and acceptance.     Plan Discussed with: CRNA and Surgeon  Anesthesia Plan Comments:         Anesthesia Quick Evaluation

## 2013-07-02 NOTE — Transfer of Care (Signed)
Immediate Anesthesia Transfer of Care Note  Patient: Jaime [name]  Procedure(s) Performed: Procedure(s) with comments: IRRIGATION AND DEBRIDEMENT EXTREMITY with placement of antibiotic beads and application of wound vac (Left) - Irrigation and Debridement Left Foot, Place Beads, Wound VAC  Patient Location: PACU  Anesthesia Type:General  Level of Consciousness: awake, alert , oriented and patient cooperative  Airway & Oxygen Therapy: Patient Spontanous Breathing and Patient connected to nasal cannula oxygen  Post-op Assessment: Report given to PACU RN, Post -op Vital signs reviewed and stable and Patient moving all extremities X 4  Post vital signs: Reviewed and stable  Complications: No apparent anesthesia complications

## 2013-07-02 NOTE — Anesthesia Postprocedure Evaluation (Signed)
Anesthesia Post Note  Patient: Jaime [name]  Procedure(s) Performed: Procedure(s) (LRB): IRRIGATION AND DEBRIDEMENT EXTREMITY with placement of antibiotic beads and application of wound vac (Left)  Anesthesia type: general  Patient location: PACU  Post pain: Pain level controlled  Post assessment: Patient's Cardiovascular Status Stable  Last Vitals:  Filed Vitals:   07/02/13 2310  BP: 169/81  Pulse: 86  Temp: 36.8 C  Resp: 18    Post vital signs: Reviewed and stable  Level of consciousness: sedated  Complications: No apparent anesthesia complications

## 2013-07-02 NOTE — H&P (Signed)
Jaime [name] is an 57 y.o. male.   Chief Complaint: Abscess left foot HPI: Patient is status post irrigation and debridement of multiple abscesses of the left foot. Patient presents at this time with new ischemic tissue with drainage and infection and presents at this time for repeat irrigation and debridement.  Past Medical History  Diagnosis Date  . Diabetes mellitus without complication   . Hypertension     Past Surgical History  Procedure Laterality Date  . I&d extremity Left 06/19/2013    Procedure: IRRIGATION AND DEBRIDEMENT EXTREMITY;  Surgeon: Nadara Mustard, MD;  Location: [facility] OR;  Service: Orthopedics;  Laterality: Left;    Family History  Problem Relation Age of Onset  . Heart failure Mother   . Heart attack Mother   . Leukemia Father    Social History:  reports that he has never smoked. He does not have any smokeless tobacco history on file. He reports that  drinks alcohol. He reports that he does not use illicit drugs.  Allergies: No Known Allergies  Medications Prior to Admission  Medication Sig Dispense Refill  . amLODipine (NORVASC) 5 MG tablet Take 1 tablet (5 mg total) by mouth daily.  30 tablet  0  . aspirin EC 81 MG tablet Take 81 mg by mouth daily.      Marland Kitchen ceFAZolin (ANCEF) 2-3 GM-% SOLR Inject 50 mLs (2 g total) into the vein every 8 (eight) hours.  4500 mL  0  . Insulin Detemir (LEVEMIR FLEXPEN) 100 UNIT/ML SOPN Inject 30 Units into the skin daily with breakfast.      . lisinopril (PRINIVIL,ZESTRIL) 40 MG tablet Take 1 tablet (40 mg total) by mouth daily.  30 tablet  0  . metFORMIN (GLUCOPHAGE) 500 MG tablet Take 1 tablet (500 mg total) by mouth 2 (two) times daily with a meal.  60 tablet  0  . Insulin Pen Needle 32G X 8 MM MISC Use for insulin injections  100 each  0    Results for orders placed during the hospital encounter of 07/02/13 (from the past 48 hour(s))  GLUCOSE, CAPILLARY     Status: Abnormal   Collection Time    07/02/13  1:33 PM   Result Value Range   Glucose-Capillary 164 (*) 70 - 99 mg/dL  GLUCOSE, CAPILLARY     Status: Abnormal   Collection Time    07/02/13  3:28 PM      Result Value Range   Glucose-Capillary 146 (*) 70 - 99 mg/dL  GLUCOSE, CAPILLARY     Status: Abnormal   Collection Time    07/02/13  5:21 PM      Result Value Range   Glucose-Capillary 150 (*) 70 - 99 mg/dL   Dg Chest 2 View  01/05/1095   *RADIOLOGY REPORT*  Clinical Data: Preop for foot surgery  CHEST - 2 VIEW  Comparison: None.  Findings: Cardiomediastinal silhouette is unremarkable.  No acute infiltrate or pleural effusion.  No pulmonary edema.  Degenerative changes are noted lower thoracic spine.  There is a right arm PICC line with tip in SVC.  No diagnostic pneumothorax.  IMPRESSION: No active disease.  Right arm PICC line in place.   Original Report Authenticated By: Natasha Mead, M.D.    Review of Systems  All other systems reviewed and are negative.    Blood pressure 152/68, pulse 92, temperature 98 F (36.7 C), temperature source Oral, resp. rate 18, height 6\' 4"  (1.93 m), weight 117.7 kg (259 lb 7.7  oz), SpO2 99.00%. Physical Exam  On examination patient has palpable pulses. The cellulitis has resolved significantly from his initial evaluation. Patient still has a drainage and has progressive necrotic tissue at the previous debridement sites and presents at this time for repeat debridement placement of antibiotic beads placement of a wound VAC. Assessment/Plan Assessment: Abscess ulceration left foot.  Plan: Will plan for excisional debridement of skin soft tissue muscle placement of antibiotic beads were placement of a wound VAC. Risks and benefits were discussed including persistent infection neurovascular injury potential for transtibial amputation. Patient states he understands was pursued this time.  Taylee Gunnells V 07/02/2013, 7:23 PM

## 2013-07-03 LAB — GLUCOSE, CAPILLARY: Glucose-Capillary: 202 mg/dL — ABNORMAL HIGH (ref 70–99)

## 2013-07-03 NOTE — Progress Notes (Signed)
Subjective: Pt stable - minimal pain   Objective: Vital signs in last 24 hours: Temp:  [98 F (36.7 C)-98.6 F (37 C)] 98.2 F (36.8 C) (07/04 0540) Pulse Rate:  [73-92] 80 (07/04 0540) Resp:  [13-19] 18 (07/04 0540) BP: (133-172)/(68-83) 133/71 mmHg (07/04 0540) SpO2:  [94 %-99 %] 98 % (07/04 0540) Weight:  [117.7 kg (259 lb 7.7 oz)] 117.7 kg (259 lb 7.7 oz) (07/03 1335)  Intake/Output from previous day: 07/03 0701 - 07/04 0700 In: 1153.3 [I.V.:1053.3; IV Piggyback:100] Out: 75 [Drains:75] Intake/Output this shift: Total I/O In: 240 [P.O.:240] Out: -   Exam:  Compartment soft  Labs: No results found for this basename: HGB,  in the last 72 hours No results found for this basename: WBC, RBC, HCT, PLT,  in the last 72 hours No results found for this basename: NA, K, CL, CO2, BUN, CREATININE, GLUCOSE, CALCIUM,  in the last 72 hours  Recent Labs  07/01/13 1449  INR 1.06    Assessment/Plan: Plan return to or am with Dr Lajoyce Corners for foot I and D   Danne Vasek SCOTT 07/03/2013, 12:56 PM

## 2013-07-03 NOTE — Evaluation (Signed)
Physical Therapy Evaluation Patient Details Name: Jaime [name] MRN: [id number] DOB: 06/12/1956 Today's Date: 07/03/2013 Time: 4098-1191 PT Time Calculation (min): 24 min  PT Assessment / Plan / Recommendation History of Present Illness  Pt. admitted with abscess of L foot and underwent I&C, antibiotic bead placment and VAC application.  Clinical Impression  Pt. Has had multipl;e abscesses of L foot and prior I&D, now with new I&D and application of VAC.  He progressed well first time up with PT and was able to maintain NWB status.  He needs acute PT to further address his decreased mobility and for negotiation of steps to enter his home.      PT Assessment  Patient needs continued PT services    Follow Up Recommendations  Home health PT    Does the patient have the potential to tolerate intense rehabilitation      Barriers to Discharge        Equipment Recommendations  None recommended by PT    Recommendations for Other Services     Frequency Min 5X/week    Precautions / Restrictions Precautions Precautions: Fall Restrictions Weight Bearing Restrictions: Yes LLE Weight Bearing: Non weight bearing   Pertinent Vitals/Pain See vitals tab       Mobility  Bed Mobility Bed Mobility: Supine to Sit;Sitting - Scoot to Edge of Bed Supine to Sit: 6: Modified independent (Device/Increase time);HOB elevated;With rails Sitting - Scoot to Edge of Bed: 6: Modified independent (Device/Increase time) Details for Bed Mobility Assistance: cues for safe technique Transfers Transfers: Sit to Stand;Stand to Sit Sit to Stand: 4: Min guard;From bed;From elevated surface;With upper extremity assist Stand to Sit: 4: Min guard;To chair/3-in-1;With upper extremity assist Details for Transfer Assistance: cues for hand placment and safe techique Ambulation/Gait Ambulation/Gait Assistance: 4: Min assist Ambulation Distance (Feet): 8 Feet Assistive device: Rolling walker Ambulation/Gait  Assistance Details: pt. able to comply 100% with NWB, needed min assist for stability.   (Second person for managing equipment only) Gait Pattern: Step-to pattern (single leg hop with R LE) General Gait Details: Pt. tolerated short distancce with hopping and will benefit from using shoe on R foot. He will ash his wife to bring his shoe to hospital.    Exercises     PT Diagnosis: Difficulty walking  PT Problem List: Decreased activity tolerance;Decreased balance;Decreased mobility;Decreased knowledge of use of DME;Decreased knowledge of precautions;Pain PT Treatment Interventions: DME instruction;Gait training;Stair training;Functional mobility training;Therapeutic activities;Patient/family education     PT Goals(Current goals can be found in the care plan section) Acute Rehab PT Goals Patient Stated Goal: return to work PT Goal Formulation: With patient Time For Goal Achievement: 07/10/13 Potential to Achieve Goals: Good  Visit Information  Last PT Received On: 07/03/13 Assistance Needed: +1 History of Present Illness: Pt. admitted with abscess of L foot and underwent I&C, antibiotic bead placment and VAC application.       Prior Functioning  Home Living Family/patient expects to be discharged to:: Private residence Living Arrangements: Spouse/significant other Available Help at Discharge: Family;Available PRN/intermittently Type of Home: House Home Access: Stairs to enter [employer] of Steps: 2 Entrance Stairs-Rails: Right;Left Home Layout: One level Home Equipment: Wheelchair - [employer] - 2 wheels;Shower seat  Lives With: Spouse Prior Function Level of Independence: Independent with assistive device(s) Comments: works at [employer]: No difficulties    Cognition  Cognition Arousal/Alertness: Awake/alert Behavior During Therapy: WFL for tasks assessed/performed Overall Cognitive Status: Within Functional Limits for  tasks assessed  Extremity/Trunk Assessment Upper Extremity Assessment Upper Extremity Assessment: Overall WFL for tasks assessed Lower Extremity Assessment Lower Extremity Assessment: Overall WFL for tasks assessed (R LE and L LE)   Balance Balance Balance Assessed: Yes Dynamic Standing Balance Dynamic Standing - Balance Support: Bilateral upper extremity supported;During functional activity Dynamic Standing - Level of Assistance: 4: Min assist  End of Session PT - End of Session Equipment Utilized During Treatment: Gait belt Activity Tolerance: Patient tolerated treatment well Patient left: in chair;with call bell/phone within reach Nurse Communication: Mobility status  GP     Ferman Hamming 07/03/2013, 1:34 PM Weldon Picking PT Acute Rehab Services [phone] [place] [phone]

## 2013-07-03 NOTE — Progress Notes (Signed)
INITIAL NUTRITION ASSESSMENT  DOCUMENTATION CODES Per approved criteria  -Obesity Unspecified   INTERVENTION: Snacks TID Reinforced DM education.   NUTRITION DIAGNOSIS: Increased nutrient needs related to wound as evidenced by estimated needs.   Goal: Pt to meet >/= 90% of their estimated nutrition needs.   Monitor:  PO intake, wound, labs, weight trend  Reason for Assessment: Pt identified as at nutrition risk on the Malnutrition Screen Tool  57 y.o. male  Admitting Dx: Abscess Left Foot  ASSESSMENT: Pt is POD # 1 s/p I&D with placement of antibiotic beads and application of wound vac due to abscess of left foot.  Pt and wife report that pt has lost a few pounds but not 20 like indicated in the records. Pt eating lunch. Discussed with pt and wife new dx of DM which he received a few weeks ago. Both with questions. Reviewed guidelines and answered questions. Pt educated last admission 05/2013.   Height: Ht Readings from Last 1 Encounters:  07/02/13 6\' 4"  (1.93 m)    Weight: Wt Readings from Last 1 Encounters:  07/02/13 259 lb 7.7 oz (117.7 kg)    Ideal Body Weight: 58.1 kg   % Ideal Body Weight: 203%  Wt Readings from Last 10 Encounters:  07/02/13 259 lb 7.7 oz (117.7 kg)  07/02/13 259 lb 7.7 oz (117.7 kg)  07/01/13 263 lb 1.6 oz (119.341 kg)  06/22/13 281 lb 1.4 oz (127.5 kg)  06/22/13 281 lb 1.4 oz (127.5 kg)    Usual Body Weight: 281 lb   % Usual Body Weight: 92%  BMI:  Body mass index is 31.6 kg/(m^2).  Estimated Nutritional Needs: Kcal: 2300-2500 Protein: 115-125 grams Fluid: >2.3 L/day  Skin: incision left foot, wound VAC left foot  Diet Order: Carb Control Meal Completion: 100%   EDUCATION NEEDS: -No education needs identified at this time   Intake/Output Summary (Last 24 hours) at 07/03/13 1022 Last data filed at 07/03/13 0906  Gross per 24 hour  Intake 1393.33 ml  Output     75 ml  Net 1318.33 ml    Last BM: 7/3   Labs:  No  results found for this basename: NA, K, CL, CO2, BUN, CREATININE, CALCIUM, MG, PHOS, GLUCOSE,  in the last 168 hours  CBG (last 3)   Recent Labs  07/02/13 2207 07/02/13 2352 07/03/13 0639  GLUCAP 139* 135* 202*   Lab Results  Component Value Date   HGBA1C 10.2* 06/17/2013   Scheduled Meds: . amLODipine  5 mg Oral Daily  . aspirin EC  325 mg Oral Daily  . aspirin EC  81 mg Oral Daily  . ceFAZolin  2 g Intravenous Q8H  . insulin detemir  30 Units Subcutaneous Daily  . lisinopril  40 mg Oral Daily  . metFORMIN  500 mg Oral BID WC    Continuous Infusions: . sodium chloride 20 mL/hr at 07/02/13 2320    Past Medical History  Diagnosis Date  . Diabetes mellitus without complication   . Hypertension     Past Surgical History  Procedure Laterality Date  . I&d extremity Left 06/19/2013    Procedure: IRRIGATION AND DEBRIDEMENT EXTREMITY;  Surgeon: Nadara Mustard, MD;  Location: [facility] OR;  Service: Orthopedics;  Laterality: Left;    Kendell Bane RD, LDN, CNSC [phone] Pager [phone] After Hours Pager

## 2013-07-03 NOTE — Progress Notes (Signed)
Night shift RN reported that patient was due to void. According to patient he voided in the night and it went uncharted. Will continue to monitor for void and record Input and output post op.

## 2013-07-04 LAB — GLUCOSE, CAPILLARY
Glucose-Capillary: 148 mg/dL — ABNORMAL HIGH (ref 70–99)
Glucose-Capillary: 150 mg/dL — ABNORMAL HIGH (ref 70–99)
Glucose-Capillary: 126 mg/dL — ABNORMAL HIGH (ref 70–99)

## 2013-07-04 NOTE — Progress Notes (Addendum)
Patient ID: Jaime [name], male   DOB: [birth year], 57 y.o.   MRN: [id number] Wound VAC changed this morning. Wound edges healthy and viable. Nursing to change wound VAC again on Monday. Plan for discharge with IV antibiotics and wound VAC dressing changes Monday Wednesday Friday  Please leave suction settings at 75 mm of mercury.

## 2013-07-04 NOTE — Progress Notes (Addendum)
Physical Therapy Treatment Patient Details Name: Jaime [name] MRN: [id number] DOB: Aug 18, 1956 Today's Date: 07/04/2013 Time: 5621-3086 PT Time Calculation (min): 30 min  PT Assessment / Plan / Recommendation  PT Comments   Pt. Making progress with short distance ambulation at NWB status.  He is concerned about having VAC at home.     Follow Up Recommendations  Home health PT     Does the patient have the potential to tolerate intense rehabilitation     Barriers to Discharge        Equipment Recommendations  None recommended by PT    Recommendations for Other Services    Frequency Min 5X/week   Progress towards PT Goals Progress towards PT goals: Progressing toward goals  Plan      Precautions / Restrictions Precautions Precautions: Fall Restrictions Weight Bearing Restrictions: Yes LLE Weight Bearing: Non weight bearing Other Position/Activity Restrictions: still no shoe present in room for R foot   Pertinent Vitals/Pain See vitals tab     Mobility  Bed Mobility Bed Mobility: Supine to Sit;Sitting - Scoot to Edge of Bed Supine to Sit: 6: Modified independent (Device/Increase time);HOB elevated;With rails Sitting - Scoot to Edge of Bed: 6: Modified independent (Device/Increase time) Details for Bed Mobility Assistance: manages well Transfers Transfers: Sit to Stand;Stand to Sit Sit to Stand: 4: Min guard;From bed;With upper extremity assist;From chair/3-in-1 Stand to Sit: 4: Min guard;To chair/3-in-1;With armrests;With upper extremity assist Details for Transfer Assistance: min guard for safety , no physical assist needed Ambulation/Gait Ambulation/Gait Assistance: 4: Min guard Ambulation Distance (Feet): 18 Feet Assistive device: Rolling walker Ambulation/Gait Assistance Details: pt. moves at slow pace and appears fearful of tripping over VAC tubing.  Pt. reassured that tubing is out of his way.  Pt. fairly stable with use of RW, no overt LOB noted.  Pt. able to  maintain NWB on level surfaces Gait Pattern: Step-to pattern (single leg hop) Gait velocity: decreased    Exercises     PT Diagnosis:    PT Problem List:   PT Treatment Interventions:     PT Goals (current goals can now be found in the care plan section)    Visit Information  Last PT Received On: 07/04/13 Assistance Needed: +1 History of Present Illness: Pt. admitted with abscess of L foot and underwent I&C, antibiotic bead placment and VAC application.    Subjective Data  Subjective: Pt. reports that Dr. Lajoyce Corners had allowed him to put L heel down for balance PTA, in order to assist him in getting up his 2 steps at home. Pt. reports he addressed this issue again with Dr. Lajoyce Corners this morning and was given permission to go ahead and touch L heel for balance to ascend steps.    Cognition  Cognition Arousal/Alertness: Awake/alert Behavior During Therapy: WFL for tasks assessed/performed Overall Cognitive Status: Within Functional Limits for tasks assessed    Balance  Dynamic Standing Balance Dynamic Standing - Balance Support: Bilateral upper extremity supported;During functional activity Dynamic Standing - Level of Assistance: 4: Min assist  End of Session PT - End of Session Equipment Utilized During Treatment: Gait belt Activity Tolerance: Patient tolerated treatment well Patient left: in chair;with call bell/phone within reach Nurse Communication: Mobility status   GP     Ferman Hamming 07/04/2013, 1:27 PM Weldon Picking PT Acute Rehab Services [phone] [place] [phone]

## 2013-07-05 NOTE — Progress Notes (Signed)
Subjective: Pt comfortable    Objective: Vital signs in last 24 hours: Temp:  [98.4 F (36.9 C)-98.5 F (36.9 C)] 98.4 F (36.9 C) (07/06 0531) Pulse Rate:  [79-97] 97 (07/06 0531) Resp:  [18] 18 (07/06 0531) BP: (141-154)/(72-79) 150/79 mmHg (07/06 0531) SpO2:  [98 %-99 %] 99 % (07/06 0531)  Intake/Output from previous day: 07/05 0701 - 07/06 0700 In: 1450 [P.O.:1200; IV Piggyback:250] Out: -  Intake/Output this shift:    Exam:  Compartment soft  Labs: No results found for this basename: HGB,  in the last 72 hours No results found for this basename: WBC, RBC, HCT, PLT,  in the last 72 hours No results found for this basename: NA, K, CL, CO2, BUN, CREATININE, GLUCOSE, CALCIUM,  in the last 72 hours No results found for this basename: LABPT, INR,  in the last 72 hours  Assessment/Plan: Awaiting wound vac approval - dc later this week   Kagan Mutchler SCOTT 07/05/2013, 10:06 AM

## 2013-07-06 ENCOUNTER — Encounter (HOSPITAL_COMMUNITY): Payer: Self-pay | Admitting: Orthopedic Surgery

## 2013-07-06 LAB — GLUCOSE, CAPILLARY
Glucose-Capillary: 161 mg/dL — ABNORMAL HIGH (ref 70–99)
Glucose-Capillary: 196 mg/dL — ABNORMAL HIGH (ref 70–99)

## 2013-07-06 MED ORDER — HYDROCODONE-ACETAMINOPHEN 5-325 MG PO TABS: 1.0000 | ORAL_TABLET | ORAL | Status: DC | PRN

## 2013-07-06 NOTE — Progress Notes (Signed)
Physical Therapy Treatment Patient Details Name: Jaime [name] MRN: [id number] DOB: [birth year] Today's Date: 07/06/2013 Time: 1610-1630 PT Time Calculation (min): 20 min  PT Assessment / Plan / Recommendation  PT Comments   Pt. is progressisng his mobility gradually but is concerned about having VAC to manage at home.  He reports he had discussion with Dr. Lajoyce Corners this am and that he was OK'd to touch L  heel to floor when attempting to get up his 2 steps at home.  Follow Up Recommendations  Home health PT (Pt may opt out of HHPT)     Does the patient have the potential to tolerate intense rehabilitation     Barriers to Discharge        Equipment Recommendations  None recommended by PT    Recommendations for Other Services    Frequency Min 5X/week   Progress towards PT Goals Progress towards PT goals: Progressing toward goals  Plan Current plan remains appropriate    Precautions / Restrictions Precautions Precautions: Fall Restrictions Weight Bearing Restrictions: Yes LLE Weight Bearing: Non weight bearing Other Position/Activity Restrictions: still no shoe present in room for R foot   Pertinent Vitals/Pain Some soreness in foot elevated for edema and pain control patient repositioned for comfort     Mobility  Bed Mobility Bed Mobility: Supine to Sit;Sitting - Scoot to Edge of Bed Supine to Sit: 6: Modified independent (Device/Increase time);HOB elevated;With rails Sitting - Scoot to Edge of Bed: 6: Modified independent (Device/Increase time) Details for Bed Mobility Assistance: manages well Transfers Transfers: Sit to Stand;Stand to Sit Sit to Stand: From bed;With upper extremity assist;From chair/3-in-1;5: Supervision Stand to Sit: 4: Min guard;To chair/3-in-1;With armrests;With upper extremity assist Details for Transfer Assistance: Cues for technique and safety , no physical assist needed Ambulation/Gait Ambulation/Gait Assistance: 4: Min guard Ambulation Distance  (Feet): 15 Feet Assistive device: Rolling walker Ambulation/Gait Assistance Details: Overall good NWB Gait Pattern: Step-to pattern (single leg hop) Gait velocity: decreased Stairs:  (Pt verbalized the technique he uses)    Exercises     PT Diagnosis:    PT Problem List:   PT Treatment Interventions:     PT Goals (current goals can now be found in the care plan section) Acute Rehab PT Goals PT Goal Formulation: With patient Time For Goal Achievement: 07/10/13 Potential to Achieve Goals: Good  Visit Information  Last PT Received On: 07/06/13 Assistance Needed: +1 History of Present Illness: Pt. admitted with abscess of L foot and underwent I&C, antibiotic bead placment and VAC application.    Subjective Data  Subjective: Pt. reports that Dr. Lajoyce Corners had allowed him to put L heel down for balance PTA, in order to assist him in getting up his 2 steps at home. Pt. reports he addressed this issue again with Dr. Lajoyce Corners this morning and was given permission to go ahead and tough L heel for balance to ascend steps.    Cognition  Cognition Arousal/Alertness: Awake/alert Behavior During Therapy: WFL for tasks assessed/performed Overall Cognitive Status: Within Functional Limits for tasks assessed    Balance     End of Session PT - End of Session Equipment Utilized During Treatment: Gait belt Activity Tolerance: Patient tolerated treatment well Patient left: in chair;with call bell/phone within reach Nurse Communication: Mobility status   GP     Olen Pel [place], [place] [phone]  07/06/2013, 4:46 PM

## 2013-07-06 NOTE — Progress Notes (Signed)
PT Cancellation Note  Patient Details Name: Jaime [name] MRN: [id number] DOB: 23-Mar-1956   Cancelled Treatment:    Reason Eval/Treat Not Completed: Patient declined, no reason specified  Mr. Rise reports he is quite confident at being able to manage getting up the steps to enter his home; He described briefly putting weight through his heel for steps, and stated Dr. Lajoyce Corners has been OK with this technique;   Will retry PT session later today as time allows; Thanks,  [place], [place] [phone]   Van Clines [facility] 07/06/2013, 9:30 AM

## 2013-07-06 NOTE — Progress Notes (Signed)
Patient ID: Jaime [name], male   DOB: [birth year], 57 y.o.   MRN: [id number] Plan for discharge once home wound VAC management is set up. Prescriptions on the chart for discharge. Continue IV antibiotics at home through PICC line.

## 2013-07-06 NOTE — Consult Note (Signed)
WOC consult Note Reason for Consult: change of VAC dressing for dc to home.  No home VAC unit in the room yet. Removed old dressing and incision with VAC sponge directly over the incision. The last digit is very macerated.  I contacted Dr. Lajoyce Corners with my concern over the toe and received orders to leave VAC off incision and this toe and only apply VAC dressing to the plantar surface wound.  Wound type:I&D of the plantar surface wound with placement of antibiotic beads Measurement:6cm x 3.5cm x 1.5cm  Wound ZOX:WRUEA, non necrotic  Drainage (amount, consistency, odor) oozing sanguinous drainage with dressing change Periwound: intact but the last toe is quite macerated, peeling of the foot and ankle Dressing procedure/placement/frequency: 1pc of black granufoam placed in the plantar foot wound, bridged to the top of the foot.  Gauze dressing placed over the incision on the lateral left foot, kelix and ACE.   Re consult if needed, will not follow at this time. Thanks  Jaime [name] [employer], CWOCN [phone])

## 2013-07-06 NOTE — Discharge Summary (Signed)
Physician Discharge Summary  Patient ID: Jaime [name] MRN: [id number] DOB/AGE: 58/17/1957 58 y.o.  Admit date: 07/02/2013 Discharge date: 07/06/2013  Admission Diagnoses: Abscess left foot  Discharge Diagnoses: Abscess left foot Active Problems:   * No active hospital problems. *   Discharged Condition: stable  Hospital Course: Patient's hospital course is essentially unremarkable. He underwent repeat irrigation and debridement of the left foot placement of wound VAC placement of antibiotic beads.  Consults: None  Significant Diagnostic Studies: labs: Routine labs  Tresurgery: See operative note   Discharge Exam: Blood pressure 156/76, pulse 88, temperature 98.2 F (36.8 C), temperature source Oral, resp. rate 18, height 6\' 4"  (1.93 m), weight 117.7 kg (259 lb 7.7 oz), SpO2 98.00%. Incision/Wound: dressing clean dry and intact  Disposition: 01-Home or Self Care  Discharge Orders   Future Appointments Provider Department Dept Phone   07/07/2013 3:45 PM Judyann Munson, MD [facility] [phone]   Future Orders Complete By Expires     Ambulatory referral to Home Health  As directed     Scheduling Instructions:      Continue home IV antibiotics through PICC line    Comments:      Please evaluate Yussef Jorge for admission to [facility].  Disciplines requested: Nursing  Services to provide: [facility]  Physician to follow patient's care (the person listed here will be responsible for signing ongoing orders): Referring Provider  Requested Start of Care Date: Within 2-3 days  Special Instructions:  Wound VAC to be changed Monday Wednesday Friday left foot for 4 weeks.    Call MD / Call 911  As directed     Comments:      If you experience chest pain or shortness of breath, CALL 911 and be transported to the hospital emergency room.  If you develope a fever above 101 F, pus (white drainage) or increased drainage or redness  at the wound, or calf pain, call your surgeon's office.    Constipation Prevention  As directed     Comments:      Drink plenty of fluids.  Prune juice may be helpful.  You may use a stool softener, such as Colace (over the counter) 100 mg twice a day.  Use MiraLax (over the counter) for constipation as needed.    Diet - low sodium heart healthy  As directed     Increase activity slowly as tolerated  As directed     Non weight bearing  As directed     Scheduling Instructions:      Left foot.        Medication List         amLODipine 5 MG tablet  Commonly known as:  NORVASC  Take 1 tablet (5 mg total) by mouth daily.     aspirin EC 81 MG tablet  Take 81 mg by mouth daily.     ceFAZolin 2-3 GM-% Solr  Commonly known as:  ANCEF  Inject 50 mLs (2 g total) into the vein every 8 (eight) hours.     HYDROcodone-acetaminophen 5-325 MG per tablet  Commonly known as:  NORCO/VICODIN  Take 1-2 tablets by mouth every 4 (four) hours as needed.     Insulin Pen Needle 32G X 8 MM Misc  Use for insulin injections     LEVEMIR FLEXPEN 100 UNIT/ML Sopn  Generic drug:  Insulin Detemir  Inject 30 Units into the skin daily with breakfast.     lisinopril 40  MG tablet  Commonly known as:  PRINIVIL,ZESTRIL  Take 1 tablet (40 mg total) by mouth daily.     metFORMIN 500 MG tablet  Commonly known as:  GLUCOPHAGE  Take 1 tablet (500 mg total) by mouth 2 (two) times daily with a meal.           Follow-up Information   Follow up with Lorilynn Lehr V, MD In 2 weeks.   Contact information:   [address] [place] [address] [place] [address] [phone]       Signed: Nadara Mustard 07/06/2013, 6:35 AM

## 2013-07-07 ENCOUNTER — Inpatient Hospital Stay: Payer: 59 | Admitting: Internal Medicine

## 2013-07-07 LAB — GLUCOSE, CAPILLARY: Glucose-Capillary: 131 mg/dL — ABNORMAL HIGH (ref 70–99)

## 2013-07-07 MED ORDER — SODIUM CHLORIDE 0.9 % IJ SOLN
10.0000 mL | INTRAMUSCULAR | Status: DC | PRN
  Administered 2013-07-07: 10 mL

## 2013-07-07 MED ORDER — HEPARIN SOD (PORK) LOCK FLUSH 100 UNIT/ML IV SOLN
250.0000 [IU] | INTRAVENOUS | Status: AC | PRN
  Administered 2013-07-07: 250 [IU]

## 2013-07-07 NOTE — Discharge Summary (Signed)
  Plan for discharge to home today. Paper work completed for home wound VAC therapy. Dressing for a PICC line will be changed before discharge.

## 2013-07-07 NOTE — Progress Notes (Signed)
PT Cancellation Note  Patient Details Name: Jaime [name] MRN: [id number] DOB: [birth year]   Cancelled Treatment:     Pt requesting to hold therapy for today.  Plans are for d/c home later today & pt states he feels comfortable with mobility & has no further questions at this time.       [name], [place] [phone] 07/07/2013

## 2013-07-08 NOTE — Care Management Note (Signed)
    Page 1 of 2   07/08/2013     7:54:55 AM   CARE MANAGEMENT NOTE 07/08/2013  Patient:  MUKUND, WEINREB   Account Number:  [id number]  Date Initiated:  07/06/2013  Documentation initiated by:  [facility]  Subjective/Objective Assessment:   admitted with abscess left foot     Action/Plan:   return home with [facility] for IV antibiotics, vac care, HHPT   Anticipated DC Date:  07/06/2013   Anticipated DC Plan:  HOME W HOME HEALTH SERVICES      DC Planning Services  CM consult      Choice offered to / List presented to:  C-1 Patient   DME arranged  VAC      DME agency  [facility]     HH arranged  HH-1 RN  HH-2 PT      HH agency  [facility]   Status of service:  Completed, signed off Medicare Important Message given?   (If response is "NO", the following Medicare IM given date fields will be blank) Date Medicare IM given:   Date Additional Medicare IM given:    Discharge Disposition:  HOME W HOME HEALTH SERVICES  Per UR Regulation:    If discussed at Long Length of Stay Meetings, dates discussed:    Comments:  07/08/13 Patient receiived vac from [facility] on 07/07/13 prior to discharge. Pam with [facility] notified of discharge on 07/07/13.   Jacquelynn Cree RN, BSN, CCM

## 2013-07-15 ENCOUNTER — Ambulatory Visit: Payer: 59 | Admitting: Family Medicine

## 2013-12-08 ENCOUNTER — Ambulatory Visit: Payer: 59 | Admitting: Family Medicine

## 2014-01-07 ENCOUNTER — Other Ambulatory Visit (HOSPITAL_COMMUNITY): Payer: Self-pay | Admitting: *Deleted

## 2014-01-07 ENCOUNTER — Other Ambulatory Visit (HOSPITAL_COMMUNITY): Payer: Self-pay | Admitting: Orthopedic Surgery

## 2014-01-07 ENCOUNTER — Encounter (HOSPITAL_COMMUNITY): Payer: Self-pay | Admitting: *Deleted

## 2014-01-07 ENCOUNTER — Encounter (HOSPITAL_COMMUNITY): Payer: Self-pay | Admitting: Pharmacy Technician

## 2014-01-07 MED ORDER — CEFAZOLIN SODIUM 10 G IJ SOLR
3.0000 g | INTRAMUSCULAR | Status: AC
  Administered 2014-01-08: 3 g via INTRAVENOUS
  Filled 2014-01-07: qty 3000

## 2014-01-08 ENCOUNTER — Encounter (HOSPITAL_COMMUNITY)
Admission: RE | Disposition: A | Discharge status: Home / Self Care | Payer: Self-pay | Source: Ambulatory Visit | Attending: Orthopedic Surgery

## 2014-01-08 ENCOUNTER — Ambulatory Visit (HOSPITAL_COMMUNITY): Payer: 59 | Admitting: Anesthesiology

## 2014-01-08 ENCOUNTER — Ambulatory Visit (HOSPITAL_COMMUNITY)
Admission: RE | Admit: 2014-01-08 | Discharge: 2014-01-09 | Disposition: A | Discharge status: Home / Self Care | Payer: 59 | Source: Ambulatory Visit | Attending: Orthopedic Surgery | Admitting: Orthopedic Surgery

## 2014-01-08 ENCOUNTER — Encounter (HOSPITAL_COMMUNITY): Payer: 59 | Admitting: Anesthesiology

## 2014-01-08 ENCOUNTER — Encounter (HOSPITAL_COMMUNITY): Payer: Self-pay | Admitting: *Deleted

## 2014-01-08 DIAGNOSIS — I10 Essential (primary) hypertension: Secondary | ICD-10-CM | POA: Insufficient documentation

## 2014-01-08 DIAGNOSIS — E1169 Type 2 diabetes mellitus with other specified complication: Secondary | ICD-10-CM | POA: Insufficient documentation

## 2014-01-08 DIAGNOSIS — Z794 Long term (current) use of insulin: Secondary | ICD-10-CM | POA: Insufficient documentation

## 2014-01-08 DIAGNOSIS — M9080 Osteopathy in diseases classified elsewhere, unspecified site: Secondary | ICD-10-CM | POA: Insufficient documentation

## 2014-01-08 DIAGNOSIS — L97509 Non-pressure chronic ulcer of other part of unspecified foot with unspecified severity: Secondary | ICD-10-CM | POA: Insufficient documentation

## 2014-01-08 DIAGNOSIS — E1149 Type 2 diabetes mellitus with other diabetic neurological complication: Secondary | ICD-10-CM | POA: Insufficient documentation

## 2014-01-08 DIAGNOSIS — E1142 Type 2 diabetes mellitus with diabetic polyneuropathy: Secondary | ICD-10-CM | POA: Insufficient documentation

## 2014-01-08 DIAGNOSIS — M86172 Other acute osteomyelitis, left ankle and foot: Secondary | ICD-10-CM

## 2014-01-08 DIAGNOSIS — M869 Osteomyelitis, unspecified: Secondary | ICD-10-CM | POA: Insufficient documentation

## 2014-01-08 HISTORY — PX: AMPUTATION: SHX166

## 2014-01-08 LAB — COMPREHENSIVE METABOLIC PANEL
ALT: 7 U/L (ref 0–53)
AST: 10 U/L (ref 0–37)
Albumin: 3 g/dL — ABNORMAL LOW (ref 3.5–5.2)
Alkaline Phosphatase: 93 U/L (ref 39–117)
BUN: 22 mg/dL (ref 6–23)
CALCIUM: 9.4 mg/dL (ref 8.4–10.5)
CO2: 23 mEq/L (ref 19–32)
Chloride: 102 mEq/L (ref 96–112)
Creatinine, Ser: 0.84 mg/dL (ref 0.50–1.35)
GLUCOSE: 173 mg/dL — AB (ref 70–99)
Potassium: 4.1 mEq/L (ref 3.7–5.3)
Sodium: 138 mEq/L (ref 137–147)
Total Bilirubin: 0.4 mg/dL (ref 0.3–1.2)
Total Protein: 7.9 g/dL (ref 6.0–8.3)

## 2014-01-08 LAB — CBC
HCT: 25.6 % — ABNORMAL LOW (ref 39.0–52.0)
HEMOGLOBIN: 8 g/dL — AB (ref 13.0–17.0)
MCH: 25 pg — AB (ref 26.0–34.0)
MCHC: 31.3 g/dL (ref 30.0–36.0)
MCV: 80 fL (ref 78.0–100.0)
Platelets: 379 10*3/uL (ref 150–400)
RBC: 3.2 MIL/uL — ABNORMAL LOW (ref 4.22–5.81)
RDW: 14.7 % (ref 11.5–15.5)
WBC: 9.8 10*3/uL (ref 4.0–10.5)

## 2014-01-08 LAB — PROTIME-INR
INR: 1.09 (ref 0.00–1.49)
Prothrombin Time: 13.9 seconds (ref 11.6–15.2)

## 2014-01-08 LAB — GLUCOSE, CAPILLARY
GLUCOSE-CAPILLARY: 154 mg/dL — AB (ref 70–99)
Glucose-Capillary: 144 mg/dL — ABNORMAL HIGH (ref 70–99)
Glucose-Capillary: 142 mg/dL — ABNORMAL HIGH (ref 70–99)
Glucose-Capillary: 132 mg/dL — ABNORMAL HIGH (ref 70–99)
Glucose-Capillary: 172 mg/dL — ABNORMAL HIGH (ref 70–99)

## 2014-01-08 LAB — APTT: aPTT: 29 seconds (ref 24–37)

## 2014-01-08 SURGERY — AMPUTATION, FOOT, RAY
Anesthesia: General | Site: Foot | Laterality: Left

## 2014-01-08 MED ORDER — ATORVASTATIN CALCIUM 10 MG PO TABS
10.0000 mg | ORAL_TABLET | Freq: Every day | ORAL | Status: DC
  Administered 2014-01-08: 10 mg via ORAL
  Filled 2014-01-08 (×2): qty 1

## 2014-01-08 MED ORDER — ONDANSETRON HCL 4 MG PO TABS: 4.0000 mg | ORAL_TABLET | Freq: Four times a day (QID) | ORAL | Status: DC | PRN

## 2014-01-08 MED ORDER — MIDAZOLAM HCL 5 MG/5ML IJ SOLN
INTRAMUSCULAR | Status: DC | PRN
  Administered 2014-01-08: 2 mg via INTRAVENOUS

## 2014-01-08 MED ORDER — OXYCODONE HCL 5 MG PO TABS
5.0000 mg | ORAL_TABLET | Freq: Once | ORAL | Status: AC | PRN
  Administered 2014-01-08: 5 mg via ORAL

## 2014-01-08 MED ORDER — LISINOPRIL 40 MG PO TABS
40.0000 mg | ORAL_TABLET | Freq: Every day | ORAL | Status: DC
  Administered 2014-01-08 – 2014-01-09 (×2): 40 mg via ORAL
  Filled 2014-01-08 (×2): qty 1

## 2014-01-08 MED ORDER — LIDOCAINE HCL (CARDIAC) 20 MG/ML IV SOLN
INTRAVENOUS | Status: DC | PRN
  Administered 2014-01-08: 40 mg via INTRAVENOUS

## 2014-01-08 MED ORDER — ASPIRIN EC 81 MG PO TBEC
81.0000 mg | DELAYED_RELEASE_TABLET | Freq: Every day | ORAL | Status: DC
  Administered 2014-01-08 – 2014-01-09 (×2): 81 mg via ORAL
  Filled 2014-01-08 (×2): qty 1

## 2014-01-08 MED ORDER — PHENYLEPHRINE HCL 10 MG/ML IJ SOLN
INTRAMUSCULAR | Status: DC | PRN
  Administered 2014-01-08: 80 ug via INTRAVENOUS

## 2014-01-08 MED ORDER — METHOCARBAMOL 500 MG PO TABS: 500.0000 mg | ORAL_TABLET | Freq: Four times a day (QID) | ORAL | Status: DC | PRN

## 2014-01-08 MED ORDER — METFORMIN HCL 500 MG PO TABS
500.0000 mg | ORAL_TABLET | Freq: Two times a day (BID) | ORAL | Status: DC
  Administered 2014-01-08 – 2014-01-09 (×2): 500 mg via ORAL
  Filled 2014-01-08 (×4): qty 1

## 2014-01-08 MED ORDER — FENTANYL CITRATE 0.05 MG/ML IJ SOLN
INTRAMUSCULAR | Status: DC | PRN
  Administered 2014-01-08: 50 ug via INTRAVENOUS

## 2014-01-08 MED ORDER — PROPOFOL 10 MG/ML IV BOLUS
INTRAVENOUS | Status: DC | PRN
  Administered 2014-01-08: 150 mg via INTRAVENOUS

## 2014-01-08 MED ORDER — METOCLOPRAMIDE HCL 10 MG PO TABS: 5.0000 mg | ORAL_TABLET | Freq: Three times a day (TID) | ORAL | Status: DC | PRN

## 2014-01-08 MED ORDER — SODIUM CHLORIDE 0.9 % IV SOLN: INTRAVENOUS | Status: DC

## 2014-01-08 MED ORDER — CEFAZOLIN SODIUM-DEXTROSE 2-3 GM-% IV SOLR
2.0000 g | Freq: Four times a day (QID) | INTRAVENOUS | Status: AC
  Administered 2014-01-08 – 2014-01-09 (×3): 2 g via INTRAVENOUS
  Filled 2014-01-08 (×3): qty 50

## 2014-01-08 MED ORDER — OXYCODONE HCL 5 MG/5ML PO SOLN: 5.0000 mg | Freq: Once | ORAL | Status: AC | PRN

## 2014-01-08 MED ORDER — INSULIN DETEMIR 100 UNIT/ML FLEXPEN: 30.0000 [IU] | PEN_INJECTOR | Freq: Every day | SUBCUTANEOUS | Status: DC

## 2014-01-08 MED ORDER — AMLODIPINE BESYLATE 5 MG PO TABS
5.0000 mg | ORAL_TABLET | Freq: Every day | ORAL | Status: DC
  Administered 2014-01-09: 5 mg via ORAL
  Filled 2014-01-08 (×2): qty 1

## 2014-01-08 MED ORDER — INSULIN DETEMIR 100 UNIT/ML ~~LOC~~ SOLN
30.0000 [IU] | Freq: Every day | SUBCUTANEOUS | Status: DC
  Administered 2014-01-08 – 2014-01-09 (×2): 30 [IU] via SUBCUTANEOUS
  Filled 2014-01-08 (×2): qty 0.3

## 2014-01-08 MED ORDER — DEXTROSE 5 % IV SOLN: 500.0000 mg | Freq: Four times a day (QID) | INTRAVENOUS | Status: DC | PRN

## 2014-01-08 MED ORDER — LACTATED RINGERS IV SOLN
INTRAVENOUS | Status: DC | PRN
  Administered 2014-01-08: 12:00:00 via INTRAVENOUS

## 2014-01-08 MED ORDER — OXYCODONE-ACETAMINOPHEN 5-325 MG PO TABS
1.0000 | ORAL_TABLET | ORAL | Status: DC | PRN
  Administered 2014-01-08 – 2014-01-09 (×4): 2 via ORAL
  Filled 2014-01-08 (×4): qty 2

## 2014-01-08 MED ORDER — OXYCODONE HCL 5 MG PO TABS
ORAL_TABLET | ORAL | Status: AC
  Filled 2014-01-08: qty 1

## 2014-01-08 MED ORDER — HYDROMORPHONE HCL PF 1 MG/ML IJ SOLN: 0.5000 mg | INTRAMUSCULAR | Status: DC | PRN

## 2014-01-08 MED ORDER — 0.9 % SODIUM CHLORIDE (POUR BTL) OPTIME
TOPICAL | Status: DC | PRN
  Administered 2014-01-08: 1000 mL

## 2014-01-08 MED ORDER — INSULIN ASPART 100 UNIT/ML ~~LOC~~ SOLN: 0.0000 [IU] | Freq: Three times a day (TID) | SUBCUTANEOUS | Status: DC

## 2014-01-08 MED ORDER — ONDANSETRON HCL 4 MG/2ML IJ SOLN: 4.0000 mg | Freq: Four times a day (QID) | INTRAMUSCULAR | Status: DC | PRN

## 2014-01-08 MED ORDER — METOCLOPRAMIDE HCL 5 MG/ML IJ SOLN: 5.0000 mg | Freq: Three times a day (TID) | INTRAMUSCULAR | Status: DC | PRN

## 2014-01-08 MED ORDER — ONDANSETRON HCL 4 MG/2ML IJ SOLN
INTRAMUSCULAR | Status: DC | PRN
  Administered 2014-01-08: 4 mg via INTRAVENOUS

## 2014-01-08 MED ORDER — HYDROMORPHONE HCL PF 1 MG/ML IJ SOLN: 0.2500 mg | INTRAMUSCULAR | Status: DC | PRN

## 2014-01-08 SURGICAL SUPPLY — 38 items
BANDAGE GAUZE ELAST BULKY 4 IN (GAUZE/BANDAGES/DRESSINGS) ×3 IMPLANT
BLADE SAW SGTL MED 73X18.5 STR (BLADE) IMPLANT
BNDG COHESIVE 4X5 TAN STRL (GAUZE/BANDAGES/DRESSINGS) ×3 IMPLANT
BNDG COHESIVE 6X5 TAN STRL LF (GAUZE/BANDAGES/DRESSINGS) ×2 IMPLANT
COVER SURGICAL LIGHT HANDLE (MISCELLANEOUS) ×3 IMPLANT
DRAPE U-SHAPE 47X51 STRL (DRAPES) ×6 IMPLANT
DRSG ADAPTIC 3X8 NADH LF (GAUZE/BANDAGES/DRESSINGS) ×3 IMPLANT
DRSG PAD ABDOMINAL 8X10 ST (GAUZE/BANDAGES/DRESSINGS) ×6 IMPLANT
DURAPREP 26ML APPLICATOR (WOUND CARE) ×3 IMPLANT
ELECT REM PT RETURN 9FT ADLT (ELECTROSURGICAL) ×3
ELECTRODE REM PT RTRN 9FT ADLT (ELECTROSURGICAL) ×1 IMPLANT
GLOVE BIOGEL PI IND STRL 8.5 (GLOVE) IMPLANT
GLOVE BIOGEL PI IND STRL 9 (GLOVE) ×1 IMPLANT
GLOVE BIOGEL PI INDICATOR 8.5 (GLOVE) ×2
GLOVE BIOGEL PI INDICATOR 9 (GLOVE) ×2
GLOVE ECLIPSE 7.0 STRL STRAW (GLOVE) ×4 IMPLANT
GLOVE SURG ORTHO 9.0 STRL STRW (GLOVE) ×5 IMPLANT
GLOVE SURG SS PI 6.5 STRL IVOR (GLOVE) ×2 IMPLANT
GOWN PREVENTION PLUS XLARGE (GOWN DISPOSABLE) ×1 IMPLANT
GOWN SRG XL XLNG 56XLVL 4 (GOWN DISPOSABLE) ×1 IMPLANT
GOWN STRL NON-REIN XL XLG LVL4 (GOWN DISPOSABLE)
GOWN STRL REUS W/ TWL LRG LVL4 (GOWN DISPOSABLE) IMPLANT
GOWN STRL REUS W/TWL LRG LVL4 (GOWN DISPOSABLE) ×3
GOWN STRL REUS W/TWL XL LVL4 (GOWN DISPOSABLE) ×4 IMPLANT
KIT BASIN OR (CUSTOM PROCEDURE TRAY) ×3 IMPLANT
KIT ROOM TURNOVER OR (KITS) ×3 IMPLANT
NS IRRIG 1000ML POUR BTL (IV SOLUTION) ×3 IMPLANT
PACK ORTHO EXTREMITY (CUSTOM PROCEDURE TRAY) ×3 IMPLANT
PAD ABD 8X10 STRL (GAUZE/BANDAGES/DRESSINGS) ×2 IMPLANT
PAD ARMBOARD 7.5X6 YLW CONV (MISCELLANEOUS) ×6 IMPLANT
SPONGE GAUZE 4X4 12PLY (GAUZE/BANDAGES/DRESSINGS) ×3 IMPLANT
SPONGE LAP 18X18 X RAY DECT (DISPOSABLE) ×3 IMPLANT
STOCKINETTE IMPERVIOUS LG (DRAPES) IMPLANT
SUT ETHILON 2 0 PSLX (SUTURE) ×6 IMPLANT
TOWEL OR 17X24 6PK STRL BLUE (TOWEL DISPOSABLE) ×3 IMPLANT
TOWEL OR 17X26 10 PK STRL BLUE (TOWEL DISPOSABLE) ×3 IMPLANT
UNDERPAD 30X30 INCONTINENT (UNDERPADS AND DIAPERS) ×3 IMPLANT
WATER STERILE IRR 1000ML POUR (IV SOLUTION) ×3 IMPLANT

## 2014-01-08 NOTE — Anesthesia Postprocedure Evaluation (Signed)
  Anesthesia Post-op Note  Patient: Jaime [name]  Procedure(s) Performed: Procedure(s) with comments: AMPUTATION RAY (Left) - Left Foot 5th Ray Amputation  Patient Location: PACU  Anesthesia Type:General  Level of Consciousness: awake and alert   Airway and Oxygen Therapy: Patient Spontanous Breathing  Post-op Pain: mild  Post-op Assessment: Post-op Vital signs reviewed, Patient's Cardiovascular Status Stable and Respiratory Function Stable  Post-op Vital Signs: Reviewed  Filed Vitals:   01/08/14 1600  BP:   Pulse: 87  Temp:   Resp: 13    Complications: No apparent anesthesia complications

## 2014-01-08 NOTE — H&P (Signed)
Jaime [name] is an 58 y.o. male.   Chief Complaint: Ulceration left foot fifth metatarsal head. Exposed bone of the fifth metatarsal head. HPI: Patient is a 58 year old gentleman with poorly controlled type 2 diabetes who has undergone previous foot salvage surgery patient developed a Wagner grade 1 ulcer beneath the fifth metatarsal head despite wound care pressure unloading patient ulcer progressed to osteomyelitis and presents at this time for fifth ray amputation.  Past Medical History  Diagnosis Date  . Diabetes mellitus without complication   . Hypertension     Past Surgical History  Procedure Laterality Date  . I&d extremity Left 06/19/2013    Procedure: IRRIGATION AND DEBRIDEMENT EXTREMITY;  Surgeon: Newt Minion, MD;  Location: [place];  Service: Orthopedics;  Laterality: Left;  . I&d extremity Left 07/02/2013    Procedure: IRRIGATION AND DEBRIDEMENT EXTREMITY with placement of antibiotic beads and application of wound vac;  Surgeon: Newt Minion, MD;  Location: [place];  Service: Orthopedics;  Laterality: Left;  Irrigation and Debridement Left Foot, Place Beads, Wound VAC    Family History  Problem Relation Age of Onset  . Heart failure Mother   . Heart attack Mother   . Leukemia Father    Social History:  reports that he has never smoked. He has never used smokeless tobacco. He reports that he drinks alcohol. He reports that he does not use illicit drugs.  Allergies: No Known Allergies  No prescriptions prior to admission    No results found for this or any previous visit (from the past 48 hour(s)). No results found.  Review of Systems  All other systems reviewed and are negative.    There were no vitals taken for this visit. Physical Exam  On examination patient has a palpable dorsalis pedis pulse he has ulceration with exposed bone with osteomyelitis of the fifth metatarsal head left foot Assessment/Plan Assessment: Osteomyelitis with ulceration fifth metatarsal  head left foot. Plan will plan for fifth ray amputation. Risks and benefits were discussed including infection neurovascular injury nonhealing of the wound need for additional surgery. Patient states he understands was pursued this time.  Franco Duley V 01/08/2014, 6:41 AM

## 2014-01-08 NOTE — Anesthesia Procedure Notes (Signed)
Procedure Name: LMA Insertion Date/Time: 01/08/2014 2:48 PM Performed by: Carney Living Pre-anesthesia Checklist: Patient identified, Emergency Drugs available, Suction available, Patient being monitored and Timeout performed Patient Re-evaluated:Patient Re-evaluated prior to inductionOxygen Delivery Method: Circle system utilized Preoxygenation: Pre-oxygenation with 100% oxygen Intubation Type: IV induction LMA: LMA inserted LMA Size: 4.0 Number of attempts: 1 Placement Confirmation: positive ETCO2 and breath sounds checked- equal and bilateral Tube secured with: Tape Dental Injury: Teeth and Oropharynx as per pre-operative assessment

## 2014-01-08 NOTE — Preoperative (Signed)
Beta Blockers   Reason not to administer Beta Blockers:Not Applicable 

## 2014-01-08 NOTE — Progress Notes (Signed)
Orthopedic Tech Progress Note Patient Details:  Jaime [name] 11-Jul-1956 [id number] Post op shoe applied to Left LE Ortho Devices Type of Ortho Device: Postop shoe/boot Ortho Device/Splint Location: Left LE Ortho Device/Splint Interventions: Application   Asia R Thompson 01/08/2014, 3:59 PM

## 2014-01-08 NOTE — Op Note (Signed)
OPERATIVE REPORT  DATE OF SURGERY: 01/08/2014  PATIENT:  Jaime [name],  58 y.o. male  PRE-OPERATIVE DIAGNOSIS:  Left Foot 5th MTH Osteomyelitis   POST-OPERATIVE DIAGNOSIS:  Left Foot 5th MTH Osteomyelitis   PROCEDURE:  Procedure(s): AMPUTATION RAY left foot fifth ray  SURGEON:  Surgeon(s): Newt Minion, MD  ANESTHESIA:   regional  EBL:  Minimal ML  SPECIMEN:  No Specimen  TOURNIQUET:  * No tourniquets in log *  PROCEDURE DETAILS: Patient is a 58 year old gentleman diabetic insensate neuropathy status post foot salvage surgery who has developed ulceration osteomyelitis of the fifth metatarsal head left foot he has failed conservative wound care and presents at this time for fifth ray amputation. Risks and benefits were discussed including infection neurovascular injury need for additional surgery. Patient states he understands was pursued this time. Description of procedure patient was brought to the operating room after undergoing an ankle block his left lower extremity was prepped using DuraPrep draped in a sterile field a time out was called. A racquet incision was made around the ulcer and the fifth ray fifth ray was resected through the base of the fifth ray. Wound was irrigated normal saline hemostasis was obtained the incision was closed using 2-0 nylon. The wound is covered with Adaptic orthopedic sponges AB dressing Kerlix and Coban. Patient was taken the PACU in stable condition plan for overnight observation.  PLAN OF CARE: Admit for overnight observation  PATIENT DISPOSITION:  PACU - hemodynamically stable.   Newt Minion, MD 01/08/2014 3:57 PM

## 2014-01-08 NOTE — Anesthesia Preprocedure Evaluation (Signed)
Anesthesia Evaluation  Patient identified by MRN, date of birth, ID band Patient awake    Reviewed: Allergy & Precautions, H&P , NPO status , Patient's Chart, lab work & pertinent test results  Airway Mallampati: III TM Distance: >3 FB Neck ROM: Full    Dental no notable dental hx. (+) Teeth Intact and Dental Advisory Given   Pulmonary neg pulmonary ROS,  breath sounds clear to auscultation  Pulmonary exam normal       Cardiovascular hypertension, On Medications Rhythm:Regular Rate:Normal     Neuro/Psych negative neurological ROS  negative psych ROS   GI/Hepatic negative GI ROS, Neg liver ROS,   Endo/Other  diabetes, Type 1, Insulin Dependent  Renal/GU negative Renal ROS  negative genitourinary   Musculoskeletal   Abdominal   Peds  Hematology negative hematology ROS (+)   Anesthesia Other Findings   Reproductive/Obstetrics negative OB ROS                           Anesthesia Physical Anesthesia Plan  ASA: III  Anesthesia Plan: General   Post-op Pain Management:    Induction: Intravenous  Airway Management Planned: LMA  Additional Equipment:   Intra-op Plan:   Post-operative Plan: Extubation in OR  Informed Consent: I have reviewed the patients History and Physical, chart, labs and discussed the procedure including the risks, benefits and alternatives for the proposed anesthesia with the patient or authorized representative who has indicated his/her understanding and acceptance.   Dental advisory given  Plan Discussed with: CRNA  Anesthesia Plan Comments:         Anesthesia Quick Evaluation

## 2014-01-09 LAB — GLUCOSE, CAPILLARY
GLUCOSE-CAPILLARY: 114 mg/dL — AB (ref 70–99)
GLUCOSE-CAPILLARY: 144 mg/dL — AB (ref 70–99)

## 2014-01-09 MED ORDER — OXYCODONE-ACETAMINOPHEN 5-325 MG PO TABS: 1.0000 | ORAL_TABLET | ORAL | Status: DC | PRN

## 2014-01-09 NOTE — Discharge Instructions (Signed)
Minimize weightbearing left lower extremity. Keep dressing clean and dry. Keep foot elevated level with the heart.

## 2014-01-09 NOTE — Discharge Summary (Signed)
  Discharge to home in stable condition. Final diagnosis osteomyelitis fifth metatarsal head left foot. Surgical intervention left foot fifth ray amputation. Followup in the office in one week. Minimize weightbearing right foot. Prescription written for Percocet for pain.

## 2014-01-09 NOTE — Evaluation (Signed)
Physical Therapy Evaluation Patient Details Name: Jaime [name] MRN: [id number] DOB: 20-May-1956 Today's Date: 01/09/2014 Time: 5643-3295 PT Time Calculation (min): 27 min  PT Assessment / Plan / Recommendation History of Present Illness  Pt. admitted with L 5th MTH ostep, and underwent L 5th ray amputation.  Pt. is to be NWB per orders, however he insists Dr. Sharol Given told him he could bear weight on L heel if he needed to.  Clinical Impression  Pt. Was admitted for L 5th ray amputation for ostepmyelitis.  Pt. Is familiar to this therapist from a prior admission.  He is mobilizing well but is not fully compliant with NWB status.  He insists that Dr. Sharol Given has given OK for heel weightbearing.  Pt. Re-educated on NWB and it's importance.  I do not believe that HHPT will be of benefit as pt. Has continued to mobilize with same pattern despite re-education.  Would appreciate Dr. Sharol Given to clarify with patient.  Otherwise, beileve pt. Is stable for DC from PT standpoint.  He has equipment from prior hospitalizations.   Will sign off at this time.    PT Assessment  Patent does not need any further PT services    Follow Up Recommendations  No PT follow up    Does the patient have the potential to tolerate intense rehabilitation      Barriers to Discharge        Equipment Recommendations  None recommended by PT    Recommendations for Other Services     Frequency      Precautions / Restrictions Precautions Precautions: Other (comment) (observance of NWB L LE) Required Braces or Orthoses: Other Brace/Splint (post op shoe) Restrictions Weight Bearing Restrictions: Yes LLE Weight Bearing: Non weight bearing (pt. reprots Dr. Sharol Given has said heel bearing is OK)   Pertinent Vitals/Pain See vitals tab       Mobility  Transfers Overall transfer level: Needs assistance Equipment used: Rolling walker (2 wheeled) Transfers: Sit to/from Stand Sit to Stand: Supervision General transfer comment:  supervision for safety and to encourage NWB compliance Ambulation/Gait Ambulation/Gait assistance: Supervision Ambulation Distance (Feet): 125 Feet Assistive device: Rolling walker (2 wheeled) Gait Pattern/deviations: Step-to pattern (single leg hop) General Gait Details: Pt. needed occasional encouragement to  maintain NWB status at all times Stairs: Yes Stairs assistance: Min guard Stair Management: Two rails;Step to pattern;Forwards Number of Stairs: 3 General stair comments: Pt. needed education on step to pattern as well as to limit WB as much as possible.  He was heel bearing to negotiate steps, and insists Dr. Sharol Given has allowed this..  Pt. re-educated on importance of NWB for wound healing    Exercises     PT Diagnosis:    PT Problem List:   PT Treatment Interventions:       PT Goals(Current goals can be found in the care plan section)    Visit Information  Last PT Received On: 01/09/14 Assistance Needed: +1 History of Present Illness: Pt. admitted with L 5th MTH ostep, and underwent L 5th ray amputation.  Pt. is to be NWB per orders, however he insists Dr. Sharol Given told him he could bear weight on L heel if he needed to.       [facility] expects to be discharged to:: Private residence Living Arrangements: Spouse/significant other Available Help at Discharge: Family;Available PRN/intermittently Type of Home: House Home Access: Stairs to enter [employer] of Steps: 2 Entrance Stairs-Rails: Right;Left Home Layout: One level Home Equipment:  Wheelchair - [employer] - 2 wheels;Shower seat (knee walker) Prior Function Level of Independence: Independent Comments: works at [employer]: No difficulties    Cognition  Cognition Arousal/Alertness: Awake/alert Behavior During Therapy: WFL for tasks assessed/performed Overall Cognitive Status: Within Functional Limits for tasks assessed     Extremity/Trunk Assessment Upper Extremity Assessment Upper Extremity Assessment: Overall WFL for tasks assessed Lower Extremity Assessment Lower Extremity Assessment: Overall WFL for tasks assessed Cervical / Trunk Assessment Cervical / Trunk Assessment: Normal   Balance    End of Session PT - End of Session Equipment Utilized During Treatment: Gait belt Activity Tolerance: Patient tolerated treatment well Patient left: in chair;with call bell/phone within reach;with family/visitor present Nurse Communication: Mobility status  GP Functional Assessment Tool Used: clinical observation Functional Limitation: Mobility: Walking and moving around Mobility: Walking and Moving Around Current Status [phone]): At least 20 percent but less than 40 percent impaired, limited or restricted Mobility: Walking and Moving Around Goal Status [phone]): At least 20 percent but less than 40 percent impaired, limited or restricted Mobility: Walking and Moving Around Discharge Status [phone]): At least 20 percent but less than 40 percent impaired, limited or restricted   Ladona Ridgel 01/09/2014, 10:36 AM Gerlean Ren PT Acute Rehab Services [phone] [place] [phone]

## 2014-01-10 NOTE — Transfer of Care (Signed)
Immediate Anesthesia Transfer of Care Note  Patient: Jaime [name]  Procedure(s) Performed: Procedure(s) with comments: AMPUTATION RAY (Left) - Left Foot 5th Ray Amputation  Patient Location: PACU  Anesthesia Type:General  Level of Consciousness: awake, alert , oriented and patient cooperative  Airway & Oxygen Therapy: Patient Spontanous Breathing and Patient connected to nasal cannula oxygen  Post-op Assessment: Report given to PACU RN, Post -op Vital signs reviewed and stable and Patient moving all extremities X 4  Post vital signs: Reviewed and stable  Complications: No apparent anesthesia complications

## 2014-01-11 ENCOUNTER — Encounter (HOSPITAL_COMMUNITY): Payer: Self-pay | Admitting: Orthopedic Surgery

## 2014-10-28 ENCOUNTER — Encounter (HOSPITAL_COMMUNITY): Payer: 59

## 2014-11-03 ENCOUNTER — Other Ambulatory Visit (HOSPITAL_COMMUNITY): Payer: Self-pay

## 2014-11-04 ENCOUNTER — Ambulatory Visit (HOSPITAL_COMMUNITY)
Admission: RE | Admit: 2014-11-04 | Discharge: 2014-11-04 | Disposition: A | Discharge status: Home / Self Care | Payer: 59 | Source: Ambulatory Visit | Attending: Internal Medicine | Admitting: Internal Medicine

## 2014-11-04 DIAGNOSIS — D649 Anemia, unspecified: Secondary | ICD-10-CM | POA: Diagnosis present

## 2014-11-04 MED ORDER — FERUMOXYTOL INJECTION 510 MG/17 ML
1020.0000 mg | Freq: Once | INTRAVENOUS | Status: AC
  Administered 2014-11-04: 1020 mg via INTRAVENOUS
  Filled 2014-11-04: qty 34

## 2014-11-04 NOTE — Discharge Instructions (Signed)

## 2015-01-03 ENCOUNTER — Encounter: Payer: Self-pay | Admitting: Physician Assistant

## 2015-01-20 ENCOUNTER — Ambulatory Visit: Payer: Self-pay | Admitting: Physician Assistant

## 2015-07-13 ENCOUNTER — Inpatient Hospital Stay (HOSPITAL_COMMUNITY)
Admission: EM | Admit: 2015-07-13 | Discharge: 2015-07-19 | DRG: 435 | Disposition: A | Discharge status: Home / Self Care | Payer: Managed Care, Other (non HMO) | Attending: Internal Medicine | Admitting: Internal Medicine

## 2015-07-13 ENCOUNTER — Inpatient Hospital Stay (HOSPITAL_COMMUNITY): Payer: Managed Care, Other (non HMO)

## 2015-07-13 ENCOUNTER — Encounter (HOSPITAL_COMMUNITY): Payer: Self-pay | Admitting: Emergency Medicine

## 2015-07-13 DIAGNOSIS — R591 Generalized enlarged lymph nodes: Secondary | ICD-10-CM | POA: Diagnosis present

## 2015-07-13 DIAGNOSIS — R17 Unspecified jaundice: Secondary | ICD-10-CM | POA: Diagnosis not present

## 2015-07-13 DIAGNOSIS — Z79899 Other long term (current) drug therapy: Secondary | ICD-10-CM

## 2015-07-13 DIAGNOSIS — C7802 Secondary malignant neoplasm of left lung: Secondary | ICD-10-CM | POA: Diagnosis present

## 2015-07-13 DIAGNOSIS — E440 Moderate protein-calorie malnutrition: Secondary | ICD-10-CM | POA: Insufficient documentation

## 2015-07-13 DIAGNOSIS — I10 Essential (primary) hypertension: Secondary | ICD-10-CM | POA: Diagnosis present

## 2015-07-13 DIAGNOSIS — Z6832 Body mass index (BMI) 32.0-32.9, adult: Secondary | ICD-10-CM | POA: Diagnosis not present

## 2015-07-13 DIAGNOSIS — E876 Hypokalemia: Secondary | ICD-10-CM | POA: Diagnosis present

## 2015-07-13 DIAGNOSIS — E43 Unspecified severe protein-calorie malnutrition: Secondary | ICD-10-CM | POA: Diagnosis present

## 2015-07-13 DIAGNOSIS — D689 Coagulation defect, unspecified: Secondary | ICD-10-CM | POA: Diagnosis present

## 2015-07-13 DIAGNOSIS — R945 Abnormal results of liver function studies: Secondary | ICD-10-CM

## 2015-07-13 DIAGNOSIS — C7801 Secondary malignant neoplasm of right lung: Secondary | ICD-10-CM | POA: Diagnosis present

## 2015-07-13 DIAGNOSIS — R932 Abnormal findings on diagnostic imaging of liver and biliary tract: Secondary | ICD-10-CM | POA: Diagnosis not present

## 2015-07-13 DIAGNOSIS — C771 Secondary and unspecified malignant neoplasm of intrathoracic lymph nodes: Secondary | ICD-10-CM | POA: Diagnosis present

## 2015-07-13 DIAGNOSIS — E119 Type 2 diabetes mellitus without complications: Secondary | ICD-10-CM

## 2015-07-13 DIAGNOSIS — Z89422 Acquired absence of other left toe(s): Secondary | ICD-10-CM

## 2015-07-13 DIAGNOSIS — K7200 Acute and subacute hepatic failure without coma: Secondary | ICD-10-CM | POA: Diagnosis present

## 2015-07-13 DIAGNOSIS — Z7982 Long term (current) use of aspirin: Secondary | ICD-10-CM

## 2015-07-13 DIAGNOSIS — D72829 Elevated white blood cell count, unspecified: Secondary | ICD-10-CM | POA: Diagnosis present

## 2015-07-13 DIAGNOSIS — E611 Iron deficiency: Secondary | ICD-10-CM | POA: Diagnosis not present

## 2015-07-13 DIAGNOSIS — R7989 Other specified abnormal findings of blood chemistry: Secondary | ICD-10-CM | POA: Diagnosis not present

## 2015-07-13 DIAGNOSIS — Z794 Long term (current) use of insulin: Secondary | ICD-10-CM

## 2015-07-13 DIAGNOSIS — J90 Pleural effusion, not elsewhere classified: Secondary | ICD-10-CM | POA: Diagnosis present

## 2015-07-13 DIAGNOSIS — C7800 Secondary malignant neoplasm of unspecified lung: Secondary | ICD-10-CM

## 2015-07-13 DIAGNOSIS — C187 Malignant neoplasm of sigmoid colon: Secondary | ICD-10-CM | POA: Diagnosis present

## 2015-07-13 DIAGNOSIS — C801 Malignant (primary) neoplasm, unspecified: Secondary | ICD-10-CM | POA: Diagnosis not present

## 2015-07-13 DIAGNOSIS — E669 Obesity, unspecified: Secondary | ICD-10-CM | POA: Diagnosis present

## 2015-07-13 DIAGNOSIS — D500 Iron deficiency anemia secondary to blood loss (chronic): Secondary | ICD-10-CM

## 2015-07-13 DIAGNOSIS — D599 Acquired hemolytic anemia, unspecified: Secondary | ICD-10-CM | POA: Diagnosis present

## 2015-07-13 DIAGNOSIS — C787 Secondary malignant neoplasm of liver and intrahepatic bile duct: Principal | ICD-10-CM | POA: Diagnosis present

## 2015-07-13 DIAGNOSIS — D630 Anemia in neoplastic disease: Secondary | ICD-10-CM | POA: Diagnosis present

## 2015-07-13 DIAGNOSIS — D61818 Other pancytopenia: Secondary | ICD-10-CM | POA: Diagnosis present

## 2015-07-13 DIAGNOSIS — K831 Obstruction of bile duct: Secondary | ICD-10-CM | POA: Insufficient documentation

## 2015-07-13 DIAGNOSIS — R970 Elevated carcinoembryonic antigen [CEA]: Secondary | ICD-10-CM | POA: Diagnosis not present

## 2015-07-13 DIAGNOSIS — R160 Hepatomegaly, not elsewhere classified: Secondary | ICD-10-CM | POA: Insufficient documentation

## 2015-07-13 DIAGNOSIS — C249 Malignant neoplasm of biliary tract, unspecified: Secondary | ICD-10-CM | POA: Diagnosis not present

## 2015-07-13 HISTORY — DX: Secondary malignant neoplasm of liver and intrahepatic bile duct: C80.1

## 2015-07-13 HISTORY — DX: Secondary malignant neoplasm of liver and intrahepatic bile duct: C78.7

## 2015-07-13 LAB — CBC
HCT: 24.7 % — ABNORMAL LOW (ref 39.0–52.0)
HCT: 23.4 % — ABNORMAL LOW (ref 39.0–52.0)
Hemoglobin: 6.9 g/dL — CL (ref 13.0–17.0)
Hemoglobin: 6.4 g/dL — CL (ref 13.0–17.0)
MCH: 21.4 pg — ABNORMAL LOW (ref 26.0–34.0)
MCH: 21.3 pg — ABNORMAL LOW (ref 26.0–34.0)
MCHC: 27.9 g/dL — ABNORMAL LOW (ref 30.0–36.0)
MCHC: 27.4 g/dL — ABNORMAL LOW (ref 30.0–36.0)
MCV: 76.5 fL — ABNORMAL LOW (ref 78.0–100.0)
MCV: 77.7 fL — AB (ref 78.0–100.0)
PLATELETS: 511 10*3/uL — AB (ref 150–400)
Platelets: 466 10*3/uL — ABNORMAL HIGH (ref 150–400)
RBC: 3.23 MIL/uL — ABNORMAL LOW (ref 4.22–5.81)
RBC: 3.01 MIL/uL — ABNORMAL LOW (ref 4.22–5.81)
RDW: 21.4 % — ABNORMAL HIGH (ref 11.5–15.5)
RDW: 21.4 % — AB (ref 11.5–15.5)
WBC: 14.7 10*3/uL — ABNORMAL HIGH (ref 4.0–10.5)
WBC: 14.1 10*3/uL — ABNORMAL HIGH (ref 4.0–10.5)

## 2015-07-13 LAB — COMPREHENSIVE METABOLIC PANEL
ALT: 73 U/L — ABNORMAL HIGH (ref 17–63)
AST: 164 U/L — ABNORMAL HIGH (ref 15–41)
Albumin: 2 g/dL — ABNORMAL LOW (ref 3.5–5.0)
Alkaline Phosphatase: 985 U/L — ABNORMAL HIGH (ref 38–126)
Anion gap: 11 (ref 5–15)
BUN: 16 mg/dL (ref 6–20)
CO2: 24 mmol/L (ref 22–32)
Calcium: 8.6 mg/dL — ABNORMAL LOW (ref 8.9–10.3)
Chloride: 104 mmol/L (ref 101–111)
Creatinine, Ser: 0.98 mg/dL (ref 0.61–1.24)
GFR calc Af Amer: >60 mL/min (ref >60)
GFR calc non Af Amer: >60 mL/min (ref >60)
Glucose, Bld: 70 mg/dL (ref 65–99)
Potassium: 2.3 mmol/L — CL (ref 3.5–5.1)
Sodium: 139 mmol/L (ref 135–145)
Total Bilirubin: 14.5 mg/dL — ABNORMAL HIGH (ref 0.3–1.2)
Total Protein: 6.9 g/dL (ref 6.5–8.1)

## 2015-07-13 LAB — RETICULOCYTES
RBC.: 3.01 MIL/uL — AB (ref 4.22–5.81)
Retic Count, Absolute: 99.3 10*3/uL (ref 19.0–186.0)
Retic Ct Pct: 3.3 % — ABNORMAL HIGH (ref 0.4–3.1)

## 2015-07-13 LAB — ABO/RH: ABO/RH(D): A POS

## 2015-07-13 LAB — AMMONIA: Ammonia: 54 umol/L — ABNORMAL HIGH (ref 9–35)

## 2015-07-13 LAB — GLUCOSE, CAPILLARY
GLUCOSE-CAPILLARY: 62 mg/dL — AB (ref 65–99)
GLUCOSE-CAPILLARY: 51 mg/dL — AB (ref 65–99)
GLUCOSE-CAPILLARY: 91 mg/dL (ref 65–99)
Glucose-Capillary: 54 mg/dL — ABNORMAL LOW (ref 65–99)
Glucose-Capillary: 68 mg/dL (ref 65–99)
Glucose-Capillary: 89 mg/dL (ref 65–99)

## 2015-07-13 LAB — IRON AND TIBC
IRON: 17 ug/dL — AB (ref 45–182)
SATURATION RATIOS: 5 % — AB (ref 17.9–39.5)
TIBC: 346 ug/dL (ref 250–450)
UIBC: 329 ug/dL

## 2015-07-13 LAB — ACETAMINOPHEN LEVEL: Acetaminophen (Tylenol), Serum: <10 ug/mL — ABNORMAL LOW (ref 10–30)

## 2015-07-13 LAB — FOLATE: FOLATE: 8.2 ng/mL (ref >5.9)

## 2015-07-13 LAB — PREPARE RBC (CROSSMATCH)

## 2015-07-13 LAB — MAGNESIUM: Magnesium: 1.4 mg/dL — ABNORMAL LOW (ref 1.7–2.4)

## 2015-07-13 LAB — PROTIME-INR
INR: 1.8 — ABNORMAL HIGH (ref 0.00–1.49)
Prothrombin Time: 20.9 seconds — ABNORMAL HIGH (ref 11.6–15.2)

## 2015-07-13 LAB — FERRITIN: Ferritin: 138 ng/mL (ref 24–336)

## 2015-07-13 LAB — LACTATE DEHYDROGENASE: LDH: 883 U/L — ABNORMAL HIGH (ref 98–192)

## 2015-07-13 LAB — VITAMIN B12: VITAMIN B 12: 1047 pg/mL — AB (ref 180–914)

## 2015-07-13 MED ORDER — IOHEXOL 300 MG/ML  SOLN
100.0000 mL | Freq: Once | INTRAMUSCULAR | Status: AC | PRN
  Administered 2015-07-13: 100 mL via INTRAVENOUS

## 2015-07-13 MED ORDER — SODIUM CHLORIDE 0.9 % IJ SOLN
3.0000 mL | Freq: Two times a day (BID) | INTRAMUSCULAR | Status: DC
  Administered 2015-07-14 – 2015-07-19 (×11): 3 mL via INTRAVENOUS

## 2015-07-13 MED ORDER — IOHEXOL 300 MG/ML  SOLN
25.0000 mL | INTRAMUSCULAR | Status: AC
  Administered 2015-07-13 (×2): 25 mL via ORAL

## 2015-07-13 MED ORDER — POTASSIUM CHLORIDE 10 MEQ/100ML IV SOLN: 10.0000 meq | INTRAVENOUS | Status: DC

## 2015-07-13 MED ORDER — SODIUM CHLORIDE 0.9 % IV SOLN
Freq: Once | INTRAVENOUS | Status: AC
  Administered 2015-07-13: 23:00:00 via INTRAVENOUS

## 2015-07-13 MED ORDER — SODIUM CHLORIDE 0.9 % IV SOLN: INTRAVENOUS | Status: DC

## 2015-07-13 MED ORDER — DEXTROSE 50 % IV SOLN
INTRAVENOUS | Status: AC
  Administered 2015-07-13: 25 mL via INTRAVENOUS
  Filled 2015-07-13: qty 50

## 2015-07-13 MED ORDER — AMLODIPINE BESYLATE 5 MG PO TABS
5.0000 mg | ORAL_TABLET | Freq: Every day | ORAL | Status: DC
  Administered 2015-07-14 – 2015-07-17 (×4): 5 mg via ORAL
  Filled 2015-07-13 (×5): qty 1

## 2015-07-13 MED ORDER — INSULIN ASPART 100 UNIT/ML ~~LOC~~ SOLN: 3.0000 [IU] | SUBCUTANEOUS | Status: DC

## 2015-07-13 MED ORDER — POTASSIUM CHLORIDE CRYS ER 20 MEQ PO TBCR
60.0000 meq | EXTENDED_RELEASE_TABLET | Freq: Once | ORAL | Status: DC
  Filled 2015-07-13: qty 3

## 2015-07-13 MED ORDER — INSULIN DETEMIR 100 UNIT/ML ~~LOC~~ SOLN
15.0000 [IU] | Freq: Every day | SUBCUTANEOUS | Status: DC
  Administered 2015-07-14: 15 [IU] via SUBCUTANEOUS
  Filled 2015-07-13 (×2): qty 0.15

## 2015-07-13 MED ORDER — DEXTROSE 50 % IV SOLN
25.0000 mL | Freq: Once | INTRAVENOUS | Status: AC
  Administered 2015-07-13: 25 mL via INTRAVENOUS

## 2015-07-13 MED ORDER — POTASSIUM CHLORIDE 10 MEQ/100ML IV SOLN
10.0000 meq | Freq: Once | INTRAVENOUS | Status: AC
  Administered 2015-07-13: 10 meq via INTRAVENOUS
  Filled 2015-07-13: qty 100

## 2015-07-13 MED ORDER — POTASSIUM CHLORIDE 10 MEQ/100ML IV SOLN
10.0000 meq | INTRAVENOUS | Status: AC
  Administered 2015-07-13 (×3): 10 meq via INTRAVENOUS
  Filled 2015-07-13 (×3): qty 100

## 2015-07-13 MED ORDER — INSULIN ASPART 100 UNIT/ML ~~LOC~~ SOLN: 0.0000 [IU] | SUBCUTANEOUS | Status: DC

## 2015-07-13 MED ORDER — PHYTONADIONE 5 MG PO TABS
10.0000 mg | ORAL_TABLET | Freq: Once | ORAL | Status: AC
  Administered 2015-07-13: 10 mg via ORAL
  Filled 2015-07-13: qty 2

## 2015-07-13 MED ORDER — MAGNESIUM OXIDE 400 (241.3 MG) MG PO TABS
400.0000 mg | ORAL_TABLET | Freq: Two times a day (BID) | ORAL | Status: DC
  Administered 2015-07-13 – 2015-07-19 (×12): 400 mg via ORAL
  Filled 2015-07-13 (×16): qty 1

## 2015-07-13 MED ORDER — DEXTROSE 5 % IV SOLN
INTRAVENOUS | Status: DC
  Administered 2015-07-14: 22:00:00 via INTRAVENOUS

## 2015-07-13 MED ORDER — POTASSIUM CHLORIDE 20 MEQ/15ML (10%) PO SOLN
40.0000 meq | Freq: Once | ORAL | Status: AC
  Administered 2015-07-13: 40 meq via ORAL
  Filled 2015-07-13: qty 30

## 2015-07-13 NOTE — ED Notes (Signed)
Pt to [facility] for evaluation of generalized fatigue for approx 3 weeks, discovered K was low and HGB low. Sent over for further workup. 18g in place for EMS. Pt alert, oriented x4, VSS.

## 2015-07-13 NOTE — Progress Notes (Signed)
CRITICAL VALUE ALERT  Critical value received:  Hemoglobin 6.4  Date of notification:  07/13/2015  Time of notification:  2144  Critical value read back:Yes.    Nurse who received alert:  Dorothea Glassman   MD notified (1st page):  Chaney Malling, NP  Time of first page:  2150  Responding MD:  Chaney Malling, NP  Time MD responded:  2206

## 2015-07-13 NOTE — ED Notes (Signed)
MD at bedside. 

## 2015-07-13 NOTE — H&P (Addendum)
[facility] History and Physical  Jaime [name] HMC:[id number] DOB: 09-17-1956 DOA: 07/13/2015  Referring physician: Dr. Wilson Singer PCP: Elsie Stain, MD  Specialists:  none  Chief Complaint: Abn labs  HPI: 59 y/o ? 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 He states that his stool has been loose for the past week but has been dark or tarry and has not been clay colored either He's never had a blood transfusion that he can remember He tells me that other than his foot surgeries and DKA as well as some ophthalmological surgeries he is a pretty healthy guy still working - Chest pain, - blurred vision, and double vision, recent falls commands a seizure, - shortness of breath  He tells me he went to the beach in [place] which may coincide with a viral illness but he has not had any symptoms or any fevers and neither has his partner   Labs on admission show Potassium 2.3 calcium 8.6 and alkaline phosphatase 985 AST/ALT 160/73, Total bilirubin 14.5 Ammonia 54 LDH 883  Hemoglobin 6.9, 76.5, WBC 14.7 Tylenol level less than 10 Platelet 466  EKG sinus rhythm with no old for comparison    Past Medical History  Diagnosis Date  .  Diabetes mellitus without complication   . Hypertension    Past Surgical History  Procedure Laterality Date  . I&d extremity Left 06/19/2013    Procedure: IRRIGATION AND DEBRIDEMENT EXTREMITY;  Surgeon: Newt Minion, MD;  Location: [place];  Service: Orthopedics;  Laterality: Left;  . I&d extremity Left 07/02/2013    Procedure: IRRIGATION AND DEBRIDEMENT EXTREMITY with placement of antibiotic beads and application of wound vac;  Surgeon: Newt Minion, MD;  Location: [place];  Service: Orthopedics;  Laterality: Left;  Irrigation and Debridement Left Foot, Place Beads, Wound VAC  . Amputation Left 01/08/2014    Procedure: AMPUTATION RAY;  Surgeon: Newt Minion, MD;  Location: [place];  Service: Orthopedics;  Laterality: Left;  Left Foot 5th Ray Amputation   Social History:  History   Social History Narrative   Lives with wife at home    No Known Allergies  Family History  Problem Relation Age of Onset  . Heart failure Mother   . Heart attack Mother   . Leukemia Father     Prior to Admission medications   Medication Sig Start Date End Date Taking? Authorizing Provider  amLODipine (NORVASC) 5 MG tablet Take 1 tablet (5 mg total) by mouth daily. 06/22/13  Yes Shanker Kristeen Mans, MD  aspirin EC 81 MG tablet Take 81 mg by mouth daily.   Yes Historical Provider, MD  atorvastatin (LIPITOR) 10 MG tablet Take 10 mg by mouth daily  at 6 PM.   Yes Historical Provider, MD  ferrous sulfate 325 (65 FE) MG tablet Take 325 mg by mouth 2 (two) times daily with a meal.   Yes Historical Provider, MD  Insulin Detemir (LEVEMIR FLEXPEN) 100 UNIT/ML SOPN Inject 30 Units into the skin daily with breakfast.   Yes Historical Provider, MD  lisinopril (PRINIVIL,ZESTRIL) 40 MG tablet Take 1 tablet (40 mg total) by mouth daily. 06/22/13  Yes Shanker Kristeen Mans, MD  metFORMIN (GLUCOPHAGE) 1000 MG tablet Take 1,000 mg by mouth 2 (two) times daily. 08/14/13  Yes Historical Provider, MD  Insulin Pen Needle 32G X 8 MM MISC Use  for insulin injections 06/22/13   Shanker Kristeen Mans, MD  metFORMIN (GLUCOPHAGE) 500 MG tablet Take 1 tablet (500 mg total) by mouth 2 (two) times daily with a meal. Patient not taking: Reported on 07/13/2015 06/22/13   Jonetta Osgood, MD  oxyCODONE-acetaminophen (ROXICET) 5-325 MG per tablet Take 1 tablet by mouth every 4 (four) hours as needed for severe pain. Patient not taking: Reported on 07/13/2015 01/09/14   Newt Minion, MD   Physical Exam: Filed Vitals:   07/13/15 1500 07/13/15 1510 07/13/15 1545 07/13/15 1600  BP: 140/67 140/87 140/69 157/76  Pulse: 86 86 82 87  Temp:      TempSrc:      Resp: 19 15 20 20   SpO2: 98% 98% 97% 98%   CRP Visibly jaundiced, no pallor No submandibular lymphadenopathy no other adenopathy in supraclavicular areas S1-S2 no murmur rub or gallop Chest clinically clear no tactile vocal fremitus normal resonance Abdomen is obese, I feel an increased span of his liver but cannot appreciate any splenomegaly He has no asterixis his reflexes are not brisk  Labs on Admission:  Basic Metabolic Panel:  Recent Labs Lab 07/13/15 1330 07/13/15 1456  NA 139  --   K 2.3*  --   CL 104  --   CO2 24  --   GLUCOSE 70  --   BUN 16  --   CREATININE 0.98  --   CALCIUM 8.6*  --   MG  --  1.4*   Liver Function Tests:  Recent Labs Lab 07/13/15 1330  AST 164*  ALT 73*  ALKPHOS 985*  BILITOT 14.5*  PROT 6.9  ALBUMIN 2.0*   No results for input(s): LIPASE, AMYLASE in the last 168 hours.  Recent Labs Lab 07/13/15 1410  AMMONIA 54*   CBC:  Recent Labs Lab 07/13/15 1330  WBC 14.7*  HGB 6.9*  HCT 24.7*  MCV 76.5*  PLT 466*   Cardiac Enzymes: No results for input(s): CKTOTAL, CKMB, CKMBINDEX, TROPONINI in the last 168 hours.  BNP (last 3 results) No results for input(s): BNP in the last 8760 hours.  ProBNP (last 3 results) No results for input(s): PROBNP in the last 8760 hours.  CBG: No results for input(s): GLUCAP in the last 168  hours.  Radiological Exams on Admission: No results found.  EKG: Independently reviewed. Sinus rhythm  Assessment/Plan Principal Problem:   Acute hepatic failure -Unclear etiology -High concern for GI malignancy specifically -DDX some myelopthisic anemia-NOt risk really for DIC-so will get Haptoglobin/ldh   - I have counseled the family that he may need multiple tests and dimensions and left the door open in terms of what specifically is going on but I told him that this is very concerning -We will give him vitamin K 10 mg by mouth now for an INR of 1.7 as  he may need an intervention by Dr. Elmo Putt of gastroenterology who was see him -He will be nothing by mouth after midnight -CT scan abdomen pelvis stat is pending -He can have clears for now and can graduate diet to diabetic once he has CT -Once CT scan returns we may need to also consult oncology which have not been presently consulted  Active Problems:   Diabetes mellitus, type II -Patient on home dosages of metformin 1000 mg twice a day which apparently was recently increased over the past month from 500 twice a day. -Some evidence that significant lactic acidosis as well as hepatic dysfunction can be seen in these cases  -Lantus/Levemir 15 units to be given, home dose is 30 units -Continue sliding scale 2   Profound anemia with leukocytosis and coagulopathy -Probably related to problem #1 -He has no fever and he has no shifting dullness therefore I do not think that this is ascitic and he is not bleeding -He will have hemocculted stool -We will repeat labs stat -He will be transfused 1 unit packed red blood cells if the repeat hemoglobin is concurrent -INR is 1.7 so this will be reversed with vitamin K 10 mg now  Hypokalemia -Etiology unclear, replace with by mouth potassium -Also give for runs of IV potassium and repeat labs in the morning -Replace magnesium IV as well    Hypertension -Amlodipine 5 mg daily   I did  discuss with Dr. Elmo Putt of gastroenterology recommends to keep patient nothing by mouth after midnight and he will see patient in am  Time spent: [place], [facility] Pager [phone]  If 7PM-7AM, please contact night-coverage [email or web address] Password [email or web address] 07/13/2015, 4:26 PM

## 2015-07-13 NOTE — ED Provider Notes (Signed)
CSN: [id number]     Arrival date & time 07/13/15  1307 History   None    Chief Complaint  Patient presents with  . Abnormal Lab  . Jaundice     (Consider location/radiation/quality/duration/timing/severity/associated sxs/prior Treatment) HPI   59 y.o. male with PMH of diabetes, HTN, history DKA 05/2013 recently seen at primary care physician's office for complaints of fatigue and jaundice. Was found to have significantly abnormal labs inclusive of hemoglobin 6.9, MCV 73, platelets 593, INR 1.9, potassium 2.6, AST 143, ALT 71 and total bili 13.7. He reports about a 3 week history of general malaise/fatigue. Jaundiced in past week. No fever or chills. No n/v. No confusion. No known hx of cirrhosis. Denies significant ETOH.   Past Medical History  Diagnosis Date  . Diabetes mellitus without complication   . Hypertension    Past Surgical History  Procedure Laterality Date  . I&d extremity Left 06/19/2013    Procedure: IRRIGATION AND DEBRIDEMENT EXTREMITY;  Surgeon: Newt Minion, MD;  Location: [place];  Service: Orthopedics;  Laterality: Left;  . I&d extremity Left 07/02/2013    Procedure: IRRIGATION AND DEBRIDEMENT EXTREMITY with placement of antibiotic beads and application of wound vac;  Surgeon: Newt Minion, MD;  Location: [place];  Service: Orthopedics;  Laterality: Left;  Irrigation and Debridement Left Foot, Place Beads, Wound VAC  . Amputation Left 01/08/2014    Procedure: AMPUTATION RAY;  Surgeon: Newt Minion, MD;  Location: [place];  Service: Orthopedics;  Laterality: Left;  Left Foot 5th Ray Amputation   Family History  Problem Relation Age of Onset  . Heart failure Mother   . Heart attack Mother   . Leukemia Father    History  Substance Use Topics  . Smoking status: Never Smoker   . Smokeless tobacco: Never Used  . Alcohol Use: Yes     Comment: social drinker    Review of Systems  All systems reviewed and negative, other than as noted in HPI.   Allergies  Review of  patient's allergies indicates no known allergies.  Home Medications   Prior to Admission medications   Medication Sig Start Date End Date Taking? Authorizing Provider  amLODipine (NORVASC) 5 MG tablet Take 1 tablet (5 mg total) by mouth daily. 06/22/13   Shanker Kristeen Mans, MD  aspirin EC 81 MG tablet Take 81 mg by mouth daily.    Historical Provider, MD  atorvastatin (LIPITOR) 10 MG tablet Take 10 mg by mouth daily at 6 PM.    Historical Provider, MD  ferrous sulfate 325 (65 FE) MG tablet Take 325 mg by mouth 2 (two) times daily with a meal.    Historical Provider, MD  Insulin Detemir (LEVEMIR FLEXPEN) 100 UNIT/ML SOPN Inject 30 Units into the skin daily with breakfast.    Historical Provider, MD  Insulin Pen Needle 32G X 8 MM MISC Use for insulin injections 06/22/13   Shanker Kristeen Mans, MD  lisinopril (PRINIVIL,ZESTRIL) 40 MG tablet Take 1 tablet (40 mg total) by mouth daily. 06/22/13   Shanker Kristeen Mans, MD  metFORMIN (GLUCOPHAGE) 500 MG tablet Take 1 tablet (500 mg total) by mouth 2 (two) times daily with a meal. 06/22/13   Shanker Kristeen Mans, MD  oxyCODONE-acetaminophen (ROXICET) 5-325 MG per tablet Take 1 tablet by mouth every 4 (four) hours as needed for severe pain. 01/09/14   Newt Minion, MD   There were no vitals taken for this visit. Physical Exam  Constitutional: He appears  well-developed and well-nourished. No distress.  HENT:  Head: Normocephalic and atraumatic.  Eyes: Conjunctivae are normal. Pupils are equal, round, and reactive to light. Right eye exhibits no discharge. Left eye exhibits no discharge. Scleral icterus is present.  Neck: Neck supple.  Cardiovascular: Normal rate, regular rhythm and normal heart sounds.  Exam reveals no gallop and no friction rub.   No murmur heard. Pulmonary/Chest: Effort normal and breath sounds normal. No respiratory distress.  Abdominal: Soft. He exhibits no distension. There is no tenderness.  Musculoskeletal: He exhibits no edema or  tenderness.  Neurological: He is alert.  Skin: Skin is warm and dry.  jaundice  Psychiatric: He has a normal mood and affect. His behavior is normal. Thought content normal.  Nursing note and vitals reviewed.   ED Course  Procedures (including critical care time) Labs Review Labs Reviewed  CBC - Abnormal; Notable for the following:    WBC 14.7 (*)    RBC 3.23 (*)    Hemoglobin 6.9 (*)    HCT 24.7 (*)    MCV 76.5 (*)    MCH 21.4 (*)    MCHC 27.9 (*)    RDW 21.4 (*)    Platelets 466 (*)    All other components within normal limits  COMPREHENSIVE METABOLIC PANEL - Abnormal; Notable for the following:    Potassium 2.3 (*)    Calcium 8.6 (*)    Albumin 2.0 (*)    AST 164 (*)    ALT 73 (*)    Alkaline Phosphatase 985 (*)    Total Bilirubin 14.5 (*)    All other components within normal limits  PROTIME-INR - Abnormal; Notable for the following:    Prothrombin Time 20.9 (*)    INR 1.80 (*)    All other components within normal limits  LACTATE DEHYDROGENASE - Abnormal; Notable for the following:    LDH 883 (*)    All other components within normal limits  MAGNESIUM - Abnormal; Notable for the following:    Magnesium 1.4 (*)    All other components within normal limits  AMMONIA - Abnormal; Notable for the following:    Ammonia 54 (*)    All other components within normal limits  CERULOPLASMIN - Abnormal; Notable for the following:    Ceruloplasmin 48.8 (*)    All other components within normal limits  ACETAMINOPHEN LEVEL - Abnormal; Notable for the following:    Acetaminophen (Tylenol), Serum <10 (*)    All other components within normal limits  COMPREHENSIVE METABOLIC PANEL - Abnormal; Notable for the following:    Potassium 2.6 (*)    Glucose, Bld 108 (*)    Calcium 8.1 (*)    Total Protein 6.0 (*)    Albumin 1.8 (*)    AST 128 (*)    Alkaline Phosphatase 870 (*)    Total Bilirubin 14.0 (*)    All other components within normal limits  CBC - Abnormal; Notable for  the following:    RBC 2.97 (*)    Hemoglobin 6.5 (*)    HCT 22.7 (*)    MCV 76.4 (*)    MCH 21.9 (*)    MCHC 28.6 (*)    RDW 21.2 (*)    Platelets 408 (*)    All other components within normal limits  PROTIME-INR - Abnormal; Notable for the following:    Prothrombin Time 20.0 (*)    INR 1.71 (*)    All other components within normal limits  GLUCOSE, CAPILLARY -  Abnormal; Notable for the following:    Glucose-Capillary 54 (*)    All other components within normal limits  VITAMIN B12 - Abnormal; Notable for the following:    Vitamin B-12 1047 (*)    All other components within normal limits  IRON AND TIBC - Abnormal; Notable for the following:    Iron 17 (*)    Saturation Ratios 5 (*)    All other components within normal limits  RETICULOCYTES - Abnormal; Notable for the following:    Retic Ct Pct 3.3 (*)    RBC. 3.01 (*)    All other components within normal limits  HAPTOGLOBIN - Abnormal; Notable for the following:    Haptoglobin 226 (*)    All other components within normal limits  CBC - Abnormal; Notable for the following:    WBC 14.1 (*)    RBC 3.01 (*)    Hemoglobin 6.4 (*)    HCT 23.4 (*)    MCV 77.7 (*)    MCH 21.3 (*)    MCHC 27.4 (*)    RDW 21.4 (*)    Platelets 511 (*)    All other components within normal limits  GLUCOSE, CAPILLARY - Abnormal; Notable for the following:    Glucose-Capillary 62 (*)    All other components within normal limits  GLUCOSE, CAPILLARY - Abnormal; Notable for the following:    Glucose-Capillary 51 (*)    All other components within normal limits  GLUCOSE, CAPILLARY - Abnormal; Notable for the following:    Glucose-Capillary 105 (*)    All other components within normal limits  GLUCOSE, CAPILLARY - Abnormal; Notable for the following:    Glucose-Capillary 120 (*)    All other components within normal limits  CEA - Abnormal; Notable for the following:    CEA 8250.0 (*)    All other components within normal limits  CANCER  ANTIGEN 19-9 - Abnormal; Notable for the following:    CA 19-9 6313 (*)    All other components within normal limits  BASIC METABOLIC PANEL - Abnormal; Notable for the following:    Potassium 2.6 (*)    Calcium 8.3 (*)    All other components within normal limits  GLUCOSE, CAPILLARY - Abnormal; Notable for the following:    Glucose-Capillary 100 (*)    All other components within normal limits  ACETAMINOPHEN LEVEL - Abnormal; Notable for the following:    Acetaminophen (Tylenol), Serum <10 (*)    All other components within normal limits  COMPREHENSIVE METABOLIC PANEL - Abnormal; Notable for the following:    Potassium 3.0 (*)    Glucose, Bld 108 (*)    Calcium 8.2 (*)    Total Protein 6.0 (*)    Albumin 1.7 (*)    AST 112 (*)    Alkaline Phosphatase 832 (*)    Total Bilirubin 13.9 (*)    All other components within normal limits  CBC - Abnormal; Notable for the following:    RBC 2.98 (*)    Hemoglobin 6.7 (*)    HCT 23.0 (*)    MCV 77.2 (*)    MCH 22.5 (*)    MCHC 29.1 (*)    RDW 20.9 (*)    All other components within normal limits  GLUCOSE, CAPILLARY - Abnormal; Notable for the following:    Glucose-Capillary 61 (*)    All other components within normal limits  GLUCOSE, CAPILLARY - Abnormal; Notable for the following:    Glucose-Capillary 130 (*)  All other components within normal limits  HEMOGLOBIN AND HEMATOCRIT, BLOOD - Abnormal; Notable for the following:    Hemoglobin 6.9 (*)    HCT 24.8 (*)    All other components within normal limits  GLUCOSE, CAPILLARY - Abnormal; Notable for the following:    Glucose-Capillary 161 (*)    All other components within normal limits  HEMOGLOBIN AND HEMATOCRIT, BLOOD - Abnormal; Notable for the following:    Hemoglobin 8.0 (*)    HCT 27.9 (*)    All other components within normal limits  GLUCOSE, CAPILLARY - Abnormal; Notable for the following:    Glucose-Capillary 172 (*)    All other components within normal limits   COMPREHENSIVE METABOLIC PANEL - Abnormal; Notable for the following:    Glucose, Bld 148 (*)    Calcium 8.1 (*)    Total Protein 5.9 (*)    Albumin 1.7 (*)    AST 91 (*)    Alkaline Phosphatase 769 (*)    Total Bilirubin 13.0 (*)    All other components within normal limits  CBC - Abnormal; Notable for the following:    WBC 10.6 (*)    RBC 3.18 (*)    Hemoglobin 7.4 (*)    HCT 25.0 (*)    MCH 23.3 (*)    MCHC 29.6 (*)    RDW 20.5 (*)    All other components within normal limits  GLUCOSE, CAPILLARY - Abnormal; Notable for the following:    Glucose-Capillary 173 (*)    All other components within normal limits  GLUCOSE, CAPILLARY - Abnormal; Notable for the following:    Glucose-Capillary 148 (*)    All other components within normal limits  GLUCOSE, CAPILLARY - Abnormal; Notable for the following:    Glucose-Capillary 164 (*)    All other components within normal limits  GLUCOSE, CAPILLARY - Abnormal; Notable for the following:    Glucose-Capillary 140 (*)    All other components within normal limits  CBC - Abnormal; Notable for the following:    WBC 10.8 (*)    RBC 3.28 (*)    Hemoglobin 7.5 (*)    HCT 26.0 (*)    MCH 22.9 (*)    MCHC 28.8 (*)    RDW 21.2 (*)    All other components within normal limits  BASIC METABOLIC PANEL - Abnormal; Notable for the following:    Sodium 134 (*)    Glucose, Bld 129 (*)    Creatinine, Ser 0.60 (*)    Calcium 8.2 (*)    All other components within normal limits  MAGNESIUM - Abnormal; Notable for the following:    Magnesium 1.4 (*)    All other components within normal limits  GLUCOSE, CAPILLARY - Abnormal; Notable for the following:    Glucose-Capillary 125 (*)    All other components within normal limits  GLUCOSE, CAPILLARY - Abnormal; Notable for the following:    Glucose-Capillary 126 (*)    All other components within normal limits  GLUCOSE, CAPILLARY - Abnormal; Notable for the following:    Glucose-Capillary 152 (*)     All other components within normal limits  CBC - Abnormal; Notable for the following:    RBC 3.33 (*)    Hemoglobin 7.6 (*)    HCT 26.7 (*)    MCH 22.8 (*)    MCHC 28.5 (*)    RDW 21.9 (*)    All other components within normal limits  BASIC METABOLIC PANEL - Abnormal; Notable for  the following:    Glucose, Bld 124 (*)    BUN <5 (*)    Calcium 8.5 (*)    All other components within normal limits  MAGNESIUM - Abnormal; Notable for the following:    Magnesium 1.6 (*)    All other components within normal limits  GLUCOSE, CAPILLARY - Abnormal; Notable for the following:    Glucose-Capillary 149 (*)    All other components within normal limits  GLUCOSE, CAPILLARY - Abnormal; Notable for the following:    Glucose-Capillary 148 (*)    All other components within normal limits  GLUCOSE, CAPILLARY - Abnormal; Notable for the following:    Glucose-Capillary 122 (*)    All other components within normal limits  GLUCOSE, CAPILLARY - Abnormal; Notable for the following:    Glucose-Capillary 139 (*)    All other components within normal limits  GLUCOSE, CAPILLARY - Abnormal; Notable for the following:    Glucose-Capillary 219 (*)    All other components within normal limits  COMPREHENSIVE METABOLIC PANEL - Abnormal; Notable for the following:    Sodium 132 (*)    Chloride 99 (*)    Glucose, Bld 131 (*)    Calcium 8.3 (*)    Total Protein 6.4 (*)    Albumin 1.8 (*)    AST 78 (*)    Alkaline Phosphatase 635 (*)    Total Bilirubin 10.1 (*)    All other components within normal limits  CBC - Abnormal; Notable for the following:    RBC 3.17 (*)    Hemoglobin 7.4 (*)    HCT 25.6 (*)    MCH 23.3 (*)    MCHC 28.9 (*)    RDW 22.2 (*)    All other components within normal limits  GLUCOSE, CAPILLARY - Abnormal; Notable for the following:    Glucose-Capillary 136 (*)    All other components within normal limits  GLUCOSE, CAPILLARY - Abnormal; Notable for the following:     Glucose-Capillary 130 (*)    All other components within normal limits  GLUCOSE, CAPILLARY - Abnormal; Notable for the following:    Glucose-Capillary 188 (*)    All other components within normal limits  POCT I-STAT 4, (NA,K, GLUC, HGB,HCT) - Abnormal; Notable for the following:    HCT 27.0 (*)    Hemoglobin 9.2 (*)    All other components within normal limits  HEPATITIS PANEL, ACUTE  HIV ANTIBODY (ROUTINE TESTING)  FOLATE  FERRITIN  GLUCOSE, CAPILLARY  GLUCOSE, CAPILLARY  GLUCOSE, CAPILLARY  GLUCOSE, CAPILLARY  GLUCOSE, CAPILLARY  PROTIME-INR  TYPE AND SCREEN  ABO/RH  PREPARE RBC (CROSSMATCH)  PREPARE RBC (CROSSMATCH)  TYPE AND SCREEN  CYTOLOGY - NON PAP    Imaging Review No results found.   EKG Interpretation   Date/Time:  Wednesday July 13 2015 13:17:24 EDT Ventricular Rate:  91 PR Interval:  169 QRS Duration: 103 QT Interval:  368 QTC Calculation: 453 R Axis:     Text Interpretation:  Sinus rhythm Borderline T abnormalities, diffuse  leads ED PHYSICIAN INTERPRETATION AVAILABLE IN [facility] Confirmed  by TEST, Record ([id number]) on 07/14/2015 7:37:07 AM      MDM   Final diagnoses:  Acute hepatic failure    59yM with acute hepatic failure of unclear etiology. Admit for further w/u.     Virgel Manifold, MD 07/20/15 1600

## 2015-07-13 NOTE — Progress Notes (Signed)
Office labs today:   Patient: Jaime [name] ID: [id number] Note: All result statuses are Final unless otherwise noted.  Tests: (1) COMPLETE METABOLIC (0623)   GLUCOSE                   80 mg/dl                    60-110   BUN                       20 mg/dl                    5-23   CREATININE                0.8 mg/dl                   0.3-1.5  eGFR Non-African American                             98.6  eGFR African American                             119.3   SODIUM                    139 mEq/L                   135-148   POTASSIUM            [LL] 2.6 mEq/L                   3.5-5.3     RES=RESULT VERIFIED AND REPORTED TO PHYSICIAN   CHLORIDE                  103 mEq/L                   80-111   CO2                       24 mEq/L                    15-35   CALCIUM                   8.7 mg/dL                   7.0-10.5   TOTAL PROTEIN             6.7 g/dL                    6.0-8.5   ALBUMIN              [LL] 1.9 g/dL                    2.0-5.5     RES=RESULT VERIFIED AND REPORTED TO PHYSICIAN   AST                  [HH] 143 IU/L                    7-45     RFV=REPEATED FOR VERIFICATION   ALT                  [H]  71 IU/L                     5-40   ALK PHOS             [HH] 920 IU/L                    37-137     RFV=REPEATED FOR VERIFICATION   TOTAL BILIRUBIN      [HH] 13.7 mg/dl                  0.1-1.5     RFV=REPEATED FOR VERIFICATION  Tests: (2) CBC (2000)   WBC                  [H]  12.80 K/uL                  4.10-10.90   LYM                       1.0 K/uL                    0.6-4.1 ! MID                       1.3 K/uL                    0.0-1.8   GRAN                 [H]  10.5 K/uL                   2.0-7.8   LYM%                 [L]  7.9 %                       10.0-58.5 ! MID%                      10.2 %                      0.1-24.0   GRAN%                     81.9                        37.0-92.0   RBC                  [L]  3.2 M/uL                    4.2-6.3   HGB                  [LL] 6.9 g/dL                    12.0-18.0     RES=RESULT VERIFIED AND REPORTED TO PHYSICIAN   HCT                  [LL] 23.2 %                      37.0-51.0     RES=RESULT VERIFIED AND REPORTED TO PHYSICIAN   MCV                  [  L]  73.1 fL                     80.0-97.0   MCH                  [L]  21.7 pg                     26.0-32.0   MCHC                 [L]  29.7 g/dL                   31.0-36.0   PLT                  [H]  593 K/uL                    140-440  Tests: (3) A1C (5450)   A1C                  [L]  3.6 %                       4.5-6.0  Tests: (4) PT/INR (8201) ! PT/INR               [L]  1.9                         2.0-4.0  Note: An exclamation mark (!) indicates a result that was not dispersed into the flowsheet. Document Creation Date: 07/13/2015 12:18 PM _______________________________________________________________________  (1) Order result status: Final Collection or observation date-time: 07/13/2015 11:41 Requested date-time:  Receipt date-time: 07/13/2015 11:41 Reported date-time:  Referring Physician:   Ordering Physician: Velna Hatchet ([id number]) Specimen Source:  Source: Jabier Gauss Order Number:  Lab site: Location: 1, [place]  (2) Order result status: Final Collection or observation date-time: 07/13/2015 11:41 Requested date-time:  Receipt date-time: 07/13/2015 11:41 Reported date-time:  Referring Physician:   Ordering Physician: Velna Hatchet ([id number]) Specimen Source:  Source: Jabier Gauss Order Number:  Lab site: Location: 1, [place]  (3) Order result status: Final Collection or observation date-time: 07/13/2015 11:41 Requested date-time:  Receipt date-time: 07/13/2015 11:41 Reported date-time:  Referring Physician:   Ordering Physician: Velna Hatchet ([id number]) Specimen Source:  Source: Jabier Gauss Order Number:  Lab site: Location: 1, [place]  (4) Order  result status: Final Collection or observation date-time: 07/13/2015 11:41 Requested date-time:  Receipt date-time: 07/13/2015 11:41 Reported date-time:  Referring Physician:   Ordering Physician: Velna Hatchet ([id number]) Specimen Source:  Source: Jabier Gauss Order Number:  Lab site: Location: 1, [place]   -----------------  The following lab values were dispersed to the flowsheet with no units conversion:    eGFR Non-African American, 98.6, (F)  expected units: mL/min/1.13m   eGFR African American, 119.3, (F)  expected units: mL/min/1.764m  SODIUM, 139 MEQ/L, (F)  expected units: mmol/L   POTASSIUM, 2.6 MEQ/L, (F)  expected units: mmol/L   CO2, 24 MEQ/L, (F)  expected units: mmol/L   LYM, 1.0 K/UL, (F)  expected units: X1000   GRAN, 10.5 K/UL, (F)  expected units: %   RBC, 3.2 M/UL, (F)  expected units: 10*6/mm3   MCHC, 29.7 G/DL, (F)  expected units: %  -----------------  The following results were not dispersed to the flowsheet:    MID, 1.3 K/uL, (F)  MID%, 10.2 %, (F)   PT/INR, 1.9, (F)   Electronically signed by Cleaster Corin MD on 07/13/2015 at 1:08 PM

## 2015-07-14 ENCOUNTER — Encounter (HOSPITAL_COMMUNITY)
Admission: EM | Disposition: A | Discharge status: Home / Self Care | Payer: Self-pay | Source: Home / Self Care | Attending: Internal Medicine

## 2015-07-14 ENCOUNTER — Inpatient Hospital Stay (HOSPITAL_COMMUNITY): Payer: Managed Care, Other (non HMO) | Admitting: Anesthesiology

## 2015-07-14 ENCOUNTER — Inpatient Hospital Stay (HOSPITAL_COMMUNITY): Payer: Managed Care, Other (non HMO)

## 2015-07-14 ENCOUNTER — Encounter (HOSPITAL_COMMUNITY): Payer: Self-pay | Admitting: Internal Medicine

## 2015-07-14 DIAGNOSIS — K831 Obstruction of bile duct: Secondary | ICD-10-CM | POA: Insufficient documentation

## 2015-07-14 DIAGNOSIS — R945 Abnormal results of liver function studies: Secondary | ICD-10-CM

## 2015-07-14 DIAGNOSIS — R17 Unspecified jaundice: Secondary | ICD-10-CM | POA: Insufficient documentation

## 2015-07-14 DIAGNOSIS — E876 Hypokalemia: Secondary | ICD-10-CM | POA: Diagnosis present

## 2015-07-14 DIAGNOSIS — D599 Acquired hemolytic anemia, unspecified: Secondary | ICD-10-CM | POA: Diagnosis present

## 2015-07-14 DIAGNOSIS — D61818 Other pancytopenia: Secondary | ICD-10-CM

## 2015-07-14 DIAGNOSIS — R7989 Other specified abnormal findings of blood chemistry: Secondary | ICD-10-CM

## 2015-07-14 DIAGNOSIS — C787 Secondary malignant neoplasm of liver and intrahepatic bile duct: Principal | ICD-10-CM

## 2015-07-14 DIAGNOSIS — R932 Abnormal findings on diagnostic imaging of liver and biliary tract: Secondary | ICD-10-CM | POA: Insufficient documentation

## 2015-07-14 DIAGNOSIS — C801 Malignant (primary) neoplasm, unspecified: Secondary | ICD-10-CM | POA: Diagnosis present

## 2015-07-14 HISTORY — PX: ERCP: SHX5425

## 2015-07-14 LAB — COMPREHENSIVE METABOLIC PANEL
ALT: 63 U/L (ref 17–63)
ANION GAP: 11 (ref 5–15)
AST: 128 U/L — ABNORMAL HIGH (ref 15–41)
Albumin: 1.8 g/dL — ABNORMAL LOW (ref 3.5–5.0)
Alkaline Phosphatase: 870 U/L — ABNORMAL HIGH (ref 38–126)
BUN: 14 mg/dL (ref 6–20)
CHLORIDE: 102 mmol/L (ref 101–111)
CO2: 23 mmol/L (ref 22–32)
Calcium: 8.1 mg/dL — ABNORMAL LOW (ref 8.9–10.3)
Creatinine, Ser: 0.71 mg/dL (ref 0.61–1.24)
GFR calc Af Amer: >60 mL/min (ref >60)
GFR calc non Af Amer: >60 mL/min (ref >60)
Glucose, Bld: 108 mg/dL — ABNORMAL HIGH (ref 65–99)
Potassium: 2.6 mmol/L — CL (ref 3.5–5.1)
SODIUM: 136 mmol/L (ref 135–145)
Total Bilirubin: 14 mg/dL — ABNORMAL HIGH (ref 0.3–1.2)
Total Protein: 6 g/dL — ABNORMAL LOW (ref 6.5–8.1)

## 2015-07-14 LAB — POCT I-STAT 4, (NA,K, GLUC, HGB,HCT)
GLUCOSE: 67 mg/dL (ref 65–99)
HEMATOCRIT: 27 % — AB (ref 39.0–52.0)
Hemoglobin: 9.2 g/dL — ABNORMAL LOW (ref 13.0–17.0)
Potassium: 3.5 mmol/L (ref 3.5–5.1)
Sodium: 139 mmol/L (ref 135–145)

## 2015-07-14 LAB — GLUCOSE, CAPILLARY
Glucose-Capillary: 105 mg/dL — ABNORMAL HIGH (ref 65–99)
Glucose-Capillary: 120 mg/dL — ABNORMAL HIGH (ref 65–99)
Glucose-Capillary: 100 mg/dL — ABNORMAL HIGH (ref 65–99)

## 2015-07-14 LAB — HEPATITIS PANEL, ACUTE
HCV Ab: <0.1 s/co ratio (ref 0.0–0.9)
Hep A IgM: NEGATIVE
Hep B C IgM: NEGATIVE
Hepatitis B Surface Ag: NEGATIVE

## 2015-07-14 LAB — BASIC METABOLIC PANEL
Anion gap: 11 (ref 5–15)
BUN: 14 mg/dL (ref 6–20)
CALCIUM: 8.3 mg/dL — AB (ref 8.9–10.3)
CHLORIDE: 102 mmol/L (ref 101–111)
CO2: 22 mmol/L (ref 22–32)
CREATININE: 0.77 mg/dL (ref 0.61–1.24)
GFR calc non Af Amer: >60 mL/min (ref >60)
Glucose, Bld: 99 mg/dL (ref 65–99)
Potassium: 2.6 mmol/L — CL (ref 3.5–5.1)
SODIUM: 135 mmol/L (ref 135–145)

## 2015-07-14 LAB — CBC
HEMATOCRIT: 22.7 % — AB (ref 39.0–52.0)
Hemoglobin: 6.5 g/dL — CL (ref 13.0–17.0)
MCH: 21.9 pg — ABNORMAL LOW (ref 26.0–34.0)
MCHC: 28.6 g/dL — ABNORMAL LOW (ref 30.0–36.0)
MCV: 76.4 fL — AB (ref 78.0–100.0)
PLATELETS: 408 10*3/uL — AB (ref 150–400)
RBC: 2.97 MIL/uL — AB (ref 4.22–5.81)
RDW: 21.2 % — ABNORMAL HIGH (ref 11.5–15.5)
WBC: 10.5 10*3/uL (ref 4.0–10.5)

## 2015-07-14 LAB — CERULOPLASMIN: Ceruloplasmin: 48.8 mg/dL — ABNORMAL HIGH (ref 16.0–31.0)

## 2015-07-14 LAB — ACETAMINOPHEN LEVEL: Acetaminophen (Tylenol), Serum: <10 ug/mL — ABNORMAL LOW (ref 10–30)

## 2015-07-14 LAB — PROTIME-INR
INR: 1.71 — ABNORMAL HIGH (ref 0.00–1.49)
Prothrombin Time: 20 seconds — ABNORMAL HIGH (ref 11.6–15.2)

## 2015-07-14 SURGERY — ERCP, WITH INTERVENTION IF INDICATED
Anesthesia: General

## 2015-07-14 MED ORDER — POTASSIUM CHLORIDE 20 MEQ/15ML (10%) PO SOLN
40.0000 meq | Freq: Three times a day (TID) | ORAL | Status: DC
  Administered 2015-07-14 – 2015-07-17 (×9): 40 meq via ORAL
  Filled 2015-07-14 (×19): qty 30

## 2015-07-14 MED ORDER — LACTATED RINGERS IV SOLN
INTRAVENOUS | Status: DC | PRN
  Administered 2015-07-14: 16:00:00 via INTRAVENOUS

## 2015-07-14 MED ORDER — SUCCINYLCHOLINE CHLORIDE 20 MG/ML IJ SOLN
INTRAMUSCULAR | Status: DC | PRN
  Administered 2015-07-14: 60 mg via INTRAVENOUS

## 2015-07-14 MED ORDER — ONDANSETRON HCL 4 MG/2ML IJ SOLN: 4.0000 mg | Freq: Once | INTRAMUSCULAR | Status: AC | PRN

## 2015-07-14 MED ORDER — SODIUM CHLORIDE 0.9 % IV SOLN
1.5000 g | INTRAVENOUS | Status: AC
  Administered 2015-07-14: 1.5 g via INTRAVENOUS
  Filled 2015-07-14: qty 1.5

## 2015-07-14 MED ORDER — INSULIN ASPART 100 UNIT/ML ~~LOC~~ SOLN
0.0000 [IU] | Freq: Three times a day (TID) | SUBCUTANEOUS | Status: DC
  Administered 2015-07-15: 1 [IU] via SUBCUTANEOUS
  Administered 2015-07-15 – 2015-07-16 (×2): 2 [IU] via SUBCUTANEOUS
  Administered 2015-07-16 – 2015-07-17 (×3): 1 [IU] via SUBCUTANEOUS
  Administered 2015-07-17: 2 [IU] via SUBCUTANEOUS
  Administered 2015-07-18: 3 [IU] via SUBCUTANEOUS
  Administered 2015-07-19: 1 [IU] via SUBCUTANEOUS

## 2015-07-14 MED ORDER — ONDANSETRON HCL 4 MG/2ML IJ SOLN
INTRAMUSCULAR | Status: DC | PRN
  Administered 2015-07-14: 4 mg via INTRAVENOUS

## 2015-07-14 MED ORDER — PHENYLEPHRINE HCL 10 MG/ML IJ SOLN
INTRAMUSCULAR | Status: DC | PRN
  Administered 2015-07-14 (×3): 80 ug via INTRAVENOUS

## 2015-07-14 MED ORDER — LIDOCAINE HCL (CARDIAC) 20 MG/ML IV SOLN
INTRAVENOUS | Status: DC | PRN
  Administered 2015-07-14: 80 mg via INTRAVENOUS

## 2015-07-14 MED ORDER — SODIUM CHLORIDE 0.9 % IV SOLN
INTRAVENOUS | Status: DC | PRN
  Administered 2015-07-14: 30 mL

## 2015-07-14 MED ORDER — INDOMETHACIN 50 MG RE SUPP
100.0000 mg | Freq: Once | RECTAL | Status: AC
  Administered 2015-07-14: 100 mg via RECTAL
  Filled 2015-07-14: qty 2

## 2015-07-14 MED ORDER — IOHEXOL 300 MG/ML  SOLN
75.0000 mL | Freq: Once | INTRAMUSCULAR | Status: AC | PRN
  Administered 2015-07-14: 75 mL via INTRAVENOUS

## 2015-07-14 MED ORDER — GLUCAGON HCL RDNA (DIAGNOSTIC) 1 MG IJ SOLR
INTRAMUSCULAR | Status: AC
  Filled 2015-07-14: qty 1

## 2015-07-14 MED ORDER — GLUCAGON HCL RDNA (DIAGNOSTIC) 1 MG IJ SOLR
INTRAMUSCULAR | Status: DC | PRN
  Administered 2015-07-14 (×3): 0.25 mg via INTRAVENOUS

## 2015-07-14 MED ORDER — SODIUM CHLORIDE 0.9 % IV SOLN
INTRAVENOUS | Status: DC
Start: 2015-07-14 — End: 2015-07-14

## 2015-07-14 MED ORDER — POTASSIUM CHLORIDE IN NACL 40-0.9 MEQ/L-% IV SOLN
INTRAVENOUS | Status: DC
  Administered 2015-07-14: 75 mL/h via INTRAVENOUS
  Filled 2015-07-14 (×3): qty 1000

## 2015-07-14 MED ORDER — FENTANYL CITRATE (PF) 100 MCG/2ML IJ SOLN
INTRAMUSCULAR | Status: DC | PRN
  Administered 2015-07-14 (×2): 50 ug via INTRAVENOUS

## 2015-07-14 MED ORDER — PROPOFOL 10 MG/ML IV BOLUS
INTRAVENOUS | Status: DC | PRN
  Administered 2015-07-14: 140 mg via INTRAVENOUS

## 2015-07-14 NOTE — H&P (View-Only) (Signed)
Referring Provider: No ref. provider found Primary Care Physician:  Elsie Stain, MD Primary Gastroenterologist:  Elinor Parkinson primary  Reason for Consultation:  Elevated LFT's, biliary obstruction  HPI: Jaime [name] is a 59 y.o. male with PMH of diabetes, HTN, history DKA 05/2013 recently seen at primary care physician's office for complaints of fatigue and jaundice.  Was found to have significantly abnormal labs inclusive of hemoglobin 6.9, MCV 73, platelets 593, INR 1.9, potassium 2.6, AST 143, ALT 71 and total bili 13.7 and sent over [facility].  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  There is thickening in the sigmoid colon as well.  He tells me that for the past 3 weeks he's been feeling some malaise and weakness on and off. Since he was diagnosed with his diabetes mellitus he has been trying to take control of his diet and has lost 40 pounds since diagnosis.  Says that his wife had started noticing the yellowing of his skin less than one week ago.  Denies abdominal pain, nausea, vomiting, etc.  Only drinks ETOH socially.  Labs on admission showed the following: Potassium 2.3  ALP 985 AST/ALT 160/73, Total bilirubin 14.5 Ammonia 54 LDH 883 Hemoglobin 6.9, WBC 14.7 Platelet 466  Potassium is still low this AM at 2.6 and they are trying to replace that aggressively.  Total bili 14.  CEA and CA 19-9 in process.  Hgb is 6.5 grams and he received one unit PRBC's.  No sign of GI bleeding.  His INR has also been elevated as stated above.  Received a dose of Vitamin K; INR 1.71 this AM.   Past Medical History  Diagnosis Date  . Diabetes mellitus without complication   . Hypertension     Past Surgical History  Procedure Laterality Date  . I&d extremity Left 06/19/2013    Procedure: IRRIGATION AND DEBRIDEMENT EXTREMITY;  Surgeon: Newt Minion, MD;  Location: [place];   Service: Orthopedics;  Laterality: Left;  . I&d extremity Left 07/02/2013    Procedure: IRRIGATION AND DEBRIDEMENT EXTREMITY with placement of antibiotic beads and application of wound vac;  Surgeon: Newt Minion, MD;  Location: [place];  Service: Orthopedics;  Laterality: Left;  Irrigation and Debridement Left Foot, Place Beads, Wound VAC  . 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 Authorizing Provider  amLODipine (NORVASC) 5 MG tablet Take 1 tablet (5 mg total) by mouth daily. 06/22/13  Yes Shanker Kristeen Mans, MD  aspirin EC 81 MG tablet Take 81 mg by mouth daily.   Yes Historical Provider, MD  atorvastatin (LIPITOR) 10 MG tablet Take 10 mg by mouth daily at 6 PM.   Yes Historical Provider, MD  ferrous sulfate 325 (65 FE) MG tablet Take 325 mg by mouth 2 (two) times daily with a meal.   Yes Historical Provider, MD  Insulin Detemir (LEVEMIR FLEXPEN) 100 UNIT/ML SOPN Inject 30 Units into the skin daily with breakfast.   Yes Historical Provider, MD  lisinopril (PRINIVIL,ZESTRIL) 40 MG tablet Take 1 tablet (40 mg total) by mouth daily. 06/22/13  Yes Shanker Kristeen Mans, MD  metFORMIN (GLUCOPHAGE) 1000 MG tablet Take 1,000 mg by mouth 2 (two) times daily. 08/14/13  Yes Historical Provider, MD  Insulin Pen Needle 32G X 8 MM MISC Use for insulin injections 06/22/13   Shanker Kristeen Mans, MD  metFORMIN (GLUCOPHAGE) 500 MG tablet Take 1 tablet (500 mg total) by mouth 2 (two) times daily with a meal. Patient not taking: Reported on 07/13/2015 06/22/13   Jonetta Osgood, MD  oxyCODONE-acetaminophen (ROXICET) 5-325 MG per tablet Take 1 tablet by mouth every 4 (four) hours as needed for severe pain. Patient not taking: Reported on 07/13/2015 01/09/14   Newt Minion, MD    Current Facility-Administered  Medications  Medication Dose Route Frequency Provider Last Rate Last Dose  . amLODipine (NORVASC) tablet 5 mg  5 mg Oral Daily Nita Sells, MD      . dextrose 5 % solution   Intravenous Continuous Nita Sells, MD      . insulin aspart (novoLOG) injection 0-9 Units  0-9 Units Subcutaneous 6 times per day Nita Sells, MD   0 Units at 07/13/15 1700  . insulin aspart (novoLOG) injection 3 Units  3 Units Subcutaneous 6 times per day Nita Sells, MD   3 Units at 07/13/15 1745  . insulin detemir (LEVEMIR) injection 15 Units  15 Units Subcutaneous Q breakfast Nita Sells, MD   15 Units at 07/14/15 0826  . magnesium oxide (MAG-OX) tablet 400 mg  400 mg Oral BID Nita Sells, MD   400 mg at 07/13/15 2100  . sodium chloride 0.9 % injection 3 mL  3 mL Intravenous Q12H Nita Sells, MD   3 mL at 07/13/15 2100    Allergies as of 07/13/2015  . (No Known Allergies)    Family History  Problem Relation Age of Onset  . Heart failure Mother   . Heart attack Mother   . Leukemia Father     History   Social History  . Marital Status: Married    Spouse Name: N/A  . Number of Children: N/A  . Years of Education: N/A   Occupational History  . Not on file.   Social History Main Topics  . Smoking status: Never Smoker   . Smokeless tobacco: Never Used  . Alcohol Use: Yes     Comment: social drinker  . Drug Use: No  . Sexual Activity: Yes   Other Topics Concern  . Not on file   Social History Narrative   Lives with wife at home    Review of Systems: Ten point ROS is O/W negative except as mentioned in HPI.  Physical Exam: Vital signs in last 24 hours: Temp:  [97 F (36.1 C)-98.6 F (37 C)] 98.6 F (37 C) (07/14 0813) Pulse Rate:  [82-94] 94 (07/14 0813) Resp:  [15-22] 18 (07/14 0813) BP: (91-157)/(52-87) 143/67 mmHg (07/14 0813) SpO2:  [97 %-99 %] 98 % (07/14 0813) Last BM Date: 07/13/15 General:  Alert, chronically ill-appearing,  in NAD.  Jaundice noted. Head:  Normocephalic and atraumatic. Eyes:  Scleral icterus present. Ears:  Normal auditory acuity. Mouth:  No deformity or lesions.   Lungs:  Clear throughout to auscultation.  No wheezes, crackles, or rhonchi.   Heart:  Regular rate and rhythm; no murmurs, clicks, rubs, or gallops. Abdomen:  Soft, non-distended.  BS present.  Non-tender.  Rectal:  Deferred  Msk:  Symmetrical without gross deformities. Pulses:  Normal pulses noted. Extremities:  Without clubbing or edema. Neurologic:  Alert and  oriented x4;  grossly normal neurologically. Skin:  Intact without significant lesions or rashes. Psych:  Alert and cooperative. Normal  mood and affect.  Intake/Output from previous day: 07/13 0701 - 07/14 0700 In: 965 [P.O.:600; Blood:365] Out: 200 [Urine:200]  Lab Results:  Recent Labs  07/13/15 1330 07/13/15 2025 07/14/15 0522  WBC 14.7* 14.1* 10.5  HGB 6.9* 6.4* 6.5*  HCT 24.7* 23.4* 22.7*  PLT 466* 511* 408*   BMET  Recent Labs  07/13/15 1330 07/14/15 0522  NA 139 136  K 2.3* 2.6*  CL 104 102  CO2 24 23  GLUCOSE 70 108*  BUN 16 14  CREATININE 0.98 0.71  CALCIUM 8.6* 8.1*   LFT  Recent Labs  07/14/15 0522  PROT 6.0*  ALBUMIN 1.8*  AST 128*  ALT 63  ALKPHOS 870*  BILITOT 14.0*   PT/INR  Recent Labs  07/13/15 1456 07/14/15 0522  LABPROT 20.9* 20.0*  INR 1.80* 1.71*   Hepatitis Panel  Recent Labs  07/13/15 1456  HEPBSAG Negative  HCVAB <0.1  HEPAIGM Negative  HEPBIGM Negative    Studies/Results: Ct Abdomen Pelvis W Contrast  07/13/2015   CLINICAL DATA:  Jaundice fatigue for 3 weeks  EXAM: CT ABDOMEN AND PELVIS WITH CONTRAST  TECHNIQUE: Multidetector CT imaging of the abdomen and pelvis was performed using the standard protocol following bolus administration of intravenous contrast.  CONTRAST:  145mL OMNIPAQUE IOHEXOL 300 MG/ML  SOLN  COMPARISON:  None.  FINDINGS: Lung bases demonstrate a moderate right-sided pleural  effusion. Additionally multiple pulmonary nodules are identified. The largest of these in the left lower lobe measures 13 mm. The largest in the right lower lobe measures 19 mm.  The liver demonstrates diffuse involvement with multiple peripherally enhancing lesions. The largest of these near the dome of the liver measures approximately 13.6 x 10.6 cm in greatest transverse and AP dimensions respectively. Some biliary ductal dilatation is noted. Porta hepatis lymphadenopathy is noted as well as portacaval lymphadenopathy. Spleen is within normal limits. The pancreas is somewhat atrophic. The adrenal glands are within normal limits with the exception of a 2.7 cm lesion within the right adrenal gland best seen on image number 30 of series 201. Although this may represent an adenoma the possibility of metastatic disease deserves consideration given the changes in the lungs and liver. The kidneys are well visualized bilaterally. A normal excretion pattern is seen. Small nonobstructing right renal stone is noted. No focal mass lesion is noted. Mild ascites is noted within the abdomen and pelvis. The bladder is partially distended. No pelvic mass lesion is seen. Diverticular change of the colon is noted. In the midportion of the sigmoid colon there is some wall thickening identified. It would be difficult to exclude an underlying lesion on the basis of this exam. This areas best seen on images 65-67 of series 201 and on the sagittal projections and image number 113 of series 204.  The bony structures demonstrate degenerative change of the lumbar spine. No definitive lytic or blastic lesions are seen.  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 There is an associated right-sided pleural effusion and ascites identified.  Thickening of the sigmoid colon which may represent a focal lesion. Direct visualization may be helpful.  Right adrenal gland lesion. This may represent an adenoma  although metastatic disease is not excluded.  Right renal calculus.   Electronically Signed   By: Inez Catalina M.D.   On: 07/13/2015 20:34    IMPRESSION:  -Malignant biliary obstruction, unknown primary:  LFT's elevated with total bili 14.  CT shows metastatic disease throughout liver and lungs. -Hypokalemia:  Primary service replacing this aggressively before procedure today. -Acute on chronic anemia:  No sign of GI bleeding.  S/p one unit PRBC's. -Coagulopathy:  Has received one dose of Vitamin K.  INR 1.7 this AM.  PLAN: -CEA and CA 19-9 pending. -For ERCP with brushings for cytology and stent placement today.  Antibiotics and indomethacin ordered. -May need flex sig at some point to evaluate area of question on CT scan in sig colon. -Transfuse prn.   Sher Shampine D.  07/14/2015, 9:09 AM  Pager number [phone]

## 2015-07-14 NOTE — Anesthesia Postprocedure Evaluation (Signed)
  Anesthesia Post-op Note  Patient: Jaime [name]  Procedure(s) Performed: Procedure(s): ENDOSCOPIC RETROGRADE CHOLANGIOPANCREATOGRAPHY (ERCP) (N/A)  Patient Location: PACU  Anesthesia Type:General  Level of Consciousness: awake, alert , oriented and patient cooperative  Airway and Oxygen Therapy: Patient Spontanous Breathing  Post-op Pain: mild, moderate  Post-op Assessment: Post-op Vital signs reviewed, Patient's Cardiovascular Status Stable, Respiratory Function Stable, Patent Airway, No signs of Nausea or vomiting and Pain level controlled              Post-op Vital Signs: stable  Last Vitals:  Filed Vitals:   07/14/15 1710  BP: 157/77  Pulse: 80  Temp:   Resp: 24    Complications: No apparent anesthesia complications

## 2015-07-14 NOTE — Progress Notes (Addendum)
PROGRESS NOTE  Jaime [name] OJJ:[id number] DOB: 1956-04-08 DOA: 07/13/2015 PCP: Elsie Stain, MD  HPI/Recap of past 63 hours: 59 year old male with past history of hypertension and diabetes mellitus type 2 diagnosed 2 years ago underwent workup by PCP for several weeks of fatigue. Patient had several weeks of fatigue and then wife started noticing that he appeared to be somewhat jaundiced, so patient went to see his PCP on 7/13. Lab work done noted a critical anemia of 6.4 (patient previously healthy with normal labs 5 months prior) and so he was sent over to the emergency room for further evaluation. In the emergency room, patient found to have signs of biliary obstruction with a bilirubin of 14.5, elevated alkaline phosphatase, mild coagulopathy and confirmed low hemoglobin. Hospitalists were called for further evaluation and admission.  Patient transfused 2 units packed red blood cells. Gastroenterology consulted. CT scan of abdomen and pelvis noted multiple liver lesions and some ductal dilatation but no signs of frank biliary obstruction. Also noted was some inflammation around sigmoid area and given liver lesions,? Colon cancer. Multiple lesions also seen in both lungs. Anemia workup noted signs consistent with a hemolyzing  Process.  Patient this morning with no complaints. Denies any headaches, chest pain or shortness of breath or abdominal pain. No confusion. Discussed with patient, his wife and mother-in-law about CT findings obviously were quite distressed. Seen by gastroenterology and patient underwent ERCP, results pending  Addendum: ERCP noted severe stricture and common bile duct with stent placed. Brushings done, pathology pending  Assessment/Plan: Active Problems:   Diabetes mellitus, type II: Stable. Currently nothing by mouth during testing   Hypertension   Liver failure, acute: Secondary to liver metastases. Monitor INR. Avoid Tylenol.   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 Possible metastatic colon versus pancreatic cancer, although with this explained his anemia which looks to be hemolytic? Question if lymphoma would present this way. Discussed with GI who favored liver biopsy versus colonoscopy for tissue sample. Will discuss with oncology initially by phone about whether this could be lymphoma versus solid mass tumor. Tumor marker CA 19-9 and CEA levels ordered.  We'll get CT of head and chest for staging    Other pancytopenia including anemia, suspected hemolytic: Transfuse 2 units packed red blood cells. No signs of active bleeding    Hypokalemia: Magnesium level normal. Aggressively her placed potassium   Code Status: Full code  Family Communication: Wife and mother-in-law at the bedside  Disposition Plan: Awaiting further results for disposition   Consultants:  Gastroenterology  Procedures:  ERCP 7/14: Results pending  Antibiotics:  None   Objective: BP 169/86 mmHg  Pulse 87  Temp(Src) 98.3 F (36.8 C) (Oral)  Resp 18  Ht 6\' 4"  (1.93 m)  SpO2 96%  Intake/Output Summary (Last 24 hours) at 07/14/15 1513 Last data filed at 07/14/15 1438  Gross per 24 hour  Intake  977.5 ml  Output    600 ml  Net  377.5 ml   There were no vitals filed for this visit.  Exam:   General:  Alert and oriented 3, no acute distress, jaundiced  Cardiovascular: Regular rate and rhythm, S1-S2  Respiratory: Clear to auscultation bilaterally  Abdomen: Soft, nontender, nondistended, hypoactive bowel sounds  Musculoskeletal: no clubbing cyanosis or edema   Data Reviewed: Basic Metabolic Panel:  Recent Labs Lab 07/13/15 1330  07/13/15 1456 07/14/15 0522 07/14/15 1206 07/14/15 1429  NA 139  --  136 135 139  K 2.3*  --  2.6* 2.6* 3.5  CL 104  --  102 102  --   CO2 24  --  23 22  --    GLUCOSE 70  --  108* 99 67  BUN 16  --  14 14  --   CREATININE 0.98  --  0.71 0.77  --   CALCIUM 8.6*  --  8.1* 8.3*  --   MG  --  1.4*  --   --   --    Liver Function Tests:  Recent Labs Lab 07/13/15 1330 07/14/15 0522  AST 164* 128*  ALT 73* 63  ALKPHOS 985* 870*  BILITOT 14.5* 14.0*  PROT 6.9 6.0*  ALBUMIN 2.0* 1.8*   No results for input(s): LIPASE, AMYLASE in the last 168 hours.  Recent Labs Lab 07/13/15 1410  AMMONIA 54*   CBC:  Recent Labs Lab 07/13/15 1330 07/13/15 2025 07/14/15 0522 07/14/15 1429  WBC 14.7* 14.1* 10.5  --   HGB 6.9* 6.4* 6.5* 9.2*  HCT 24.7* 23.4* 22.7* 27.0*  MCV 76.5* 77.7* 76.4*  --   PLT 466* 511* 408*  --    Cardiac Enzymes:   No results for input(s): CKTOTAL, CKMB, CKMBINDEX, TROPONINI in the last 168 hours. BNP (last 3 results) No results for input(s): BNP in the last 8760 hours.  ProBNP (last 3 results) No results for input(s): PROBNP in the last 8760 hours.  CBG:  Recent Labs Lab 07/13/15 2154 07/13/15 2330 07/14/15 0440 07/14/15 0738 07/14/15 1232  GLUCAP 91 89 105* 120* 100*    No results found for this or any previous visit (from the past 240 hour(s)).   Studies: Ct Abdomen Pelvis W Contrast  07/13/2015   CLINICAL DATA:  Jaundice fatigue for 3 weeks  EXAM: CT ABDOMEN AND PELVIS WITH CONTRAST  TECHNIQUE: Multidetector CT imaging of the abdomen and pelvis was performed using the standard protocol following bolus administration of intravenous contrast.  CONTRAST:  178mL OMNIPAQUE IOHEXOL 300 MG/ML  SOLN  COMPARISON:  None.  FINDINGS: Lung bases demonstrate a moderate right-sided pleural effusion. Additionally multiple pulmonary nodules are identified. The largest of these in the left lower lobe measures 13 mm. The largest in the right lower lobe measures 19 mm.  The liver demonstrates diffuse involvement with multiple peripherally enhancing lesions. The largest of these near the dome of the liver measures  approximately 13.6 x 10.6 cm in greatest transverse and AP dimensions respectively. Some biliary ductal dilatation is noted. Porta hepatis lymphadenopathy is noted as well as portacaval lymphadenopathy. Spleen is within normal limits. The pancreas is somewhat atrophic. The adrenal glands are within normal limits with the exception of a 2.7 cm lesion within the right adrenal gland best seen on image number 30 of series 201. Although this may represent an adenoma the possibility of metastatic disease deserves consideration given the changes in the lungs and liver. The kidneys are well visualized bilaterally. A normal excretion pattern is seen. Small nonobstructing right renal stone is noted. No focal mass lesion is noted. Mild ascites is noted within the abdomen and pelvis. The bladder is partially distended. No pelvic mass lesion is seen. Diverticular change of the colon is noted. In the midportion of the sigmoid colon there is some wall thickening identified. It would be difficult to exclude an underlying lesion on the basis of this exam. This areas best  seen on images 65-67 of series 201 and on the sagittal projections and image number 113 of series 204.  The bony structures demonstrate degenerative change of the lumbar spine. No definitive lytic or blastic lesions are seen.  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 There is an associated right-sided pleural effusion and ascites identified.  Thickening of the sigmoid colon which may represent a focal lesion. Direct visualization may be helpful.  Right adrenal gland lesion. This may represent an adenoma although metastatic disease is not excluded.  Right renal calculus.   Electronically Signed   By: Inez Catalina M.D.   On: 07/13/2015 20:34    Scheduled Meds: . amLODipine  5 mg Oral Daily  . ampicillin-sulbactam (UNASYN) 1.5 g IVPB  1.5 g Intravenous to SS-Proc  . insulin aspart  0-9 Units Subcutaneous 6 times per day  .  insulin aspart  3 Units Subcutaneous 6 times per day  . insulin detemir  15 Units Subcutaneous Q breakfast  . magnesium oxide  400 mg Oral BID  . potassium chloride  40 mEq Oral TID  . sodium chloride  3 mL Intravenous Q12H    Continuous Infusions: . 0.9 % NaCl with KCl 40 mEq / L 75 mL/hr (07/14/15 1408)  . dextrose       Time spent: 25 minutes  [facility] Pager [phone]. If 7PM-7AM, please contact night-coverage at [email or web address], password [facility] 07/14/2015, 3:13 PM  LOS: 1 day

## 2015-07-14 NOTE — Anesthesia Preprocedure Evaluation (Signed)
Anesthesia Evaluation  Patient identified by MRN, date of birth, ID band Patient awake    Reviewed: Allergy & Precautions, NPO status , Patient's Chart, lab work & pertinent test results  Airway Mallampati: I       Dental   Pulmonary    Pulmonary exam normal       Cardiovascular hypertension, + Peripheral Vascular Disease Normal cardiovascular exam    Neuro/Psych    GI/Hepatic   Endo/Other  diabetes, Type 2, Insulin Dependent, Oral Hypoglycemic Agents  Renal/GU      Musculoskeletal   Abdominal   Peds  Hematology  (+) anemia ,   Anesthesia Other Findings Hepatic failure CA  Reproductive/Obstetrics                             Anesthesia Physical Anesthesia Plan  ASA: IV  Anesthesia Plan: General   Post-op Pain Management:    Induction: Intravenous  Airway Management Planned: Oral ETT  Additional Equipment:   Intra-op Plan:   Post-operative Plan: Extubation in OR  Informed Consent: I have reviewed the patients History and Physical, chart, labs and discussed the procedure including the risks, benefits and alternatives for the proposed anesthesia with the patient or authorized representative who has indicated his/her understanding and acceptance.     Plan Discussed with: CRNA, Anesthesiologist and Surgeon  Anesthesia Plan Comments:         Anesthesia Quick Evaluation

## 2015-07-14 NOTE — Interval H&P Note (Signed)
History and Physical Interval Note:  07/14/2015 2:45 PM  Jaime [name]  has presented today for surgery, with the diagnosis of Biliary obstruction  The various methods of treatment have been discussed with the patient and family. After consideration of risks, benefits and other options for treatment, the patient has consented to  Procedure(s): ENDOSCOPIC RETROGRADE CHOLANGIOPANCREATOGRAPHY (ERCP) (N/A) as a surgical intervention .  The patient's history has been reviewed, patient examined, no change in status, stable for surgery.  I have reviewed the patient's chart and labs.  Questions were answered to the patient's satisfaction.     Pricilla Riffle. Fuller Plan

## 2015-07-14 NOTE — Progress Notes (Signed)
Hypoglycemic Event  CBG: 51  Treatment: 15 GM carbohydrate snack  Symptoms: None  Follow-up CBG: Time: 2154 CBG Result: 91  Possible Reasons for Event: Inadequate meal intake   Jaime [name] V  Remember to initiate Hypoglycemia Order Set & complete

## 2015-07-14 NOTE — Op Note (Signed)
[facility] [place], [id number]   ERCP PROCEDURE REPORT        EXAM DATE: 07/14/2015 PATIENT NAME:          Jaime [name], Jaime [name]          MR #: [id number] BIRTHDATE:       28-Dec-1956     VISIT #:     [id number] ATTENDING:     Ladene Artist, MD, [facility]     STATUS:     outpatient  ASSISTANT:      Elspeth Cho and Cleda Daub INDICATIONS:  The patient is a 59 yr old male here for an ERCP due to biliary dilation on Computed Tomogram Scan, abnormal liver function test , and jaundice. PROCEDURE PERFORMED:     ERCP w/ CBD brushings and biliary stent placement MEDICATIONS:     Per Anesthesia CONSENT: The patient understands the risks and benefits of the procedure and understands that these risks include, but are not limited to: sedation, allergic reaction, infection, perforation and/or bleeding. Alternative means of evaluation and treatment include, among others: physical exam, x-rays, and/or surgical intervention. The patient elects to proceed with this endoscopic procedure. DESCRIPTION OF PROCEDURE: During intra-op preparation period all mechanical & medical equipment was checked for proper function. Hand hygiene and appropriate measures for infection prevention was taken. After the risks, benefits and alternatives of the procedure were thoroughly explained, Informed was verified, confirmed and timeout was successfully executed by the treatment team. With the patient in left semi-prone position, medications were administered intravenously.The Pentax ERCP [id number] was passed from the mouth into the esophagus and further advanced from the esophagus into the stomach. From stomach scope was directed to the second portion of the duodenum.  Major papilla was aligned with the duodenoscope. The scope position was confirmed fluoroscopically. Rest of the findings/therapeutics are given below. The scope was then completely withdrawn from the  patient and the procedure completed. The pulse, BP, and O2 saturation were monitored and documented by the physician and the nursing staff throughout the entire procedure. The patient was cared for as planned according to standard protocol. The patient was then discharged to recovery in stable condition and with appropriate post procedure care. Estimated blood loss is zero unless otherwise noted in this procedure report.  Limited endoscopic evaluation of the esophagus, stomach, D1 & D2 with the side viewing scope with no abnormalities noted. The ampulla was located the second portion of the duodenum.  The ampulla appeared normal.  The CBD was easily & freely cannulated and then a guidewire was advanced into the right intrahepatic ducts. Contrast injected and a severe stricture was noted in the proximal common bile duct. Mild dilation of the L & R intraheptic ducts. The distal CBD appeared normal.  Under endoscopic and fluoroscopic guidance brushings of the biliary stricture for cytology were obtained. Then an 8.5Fr x 9cm plastic stent was placed in the bile duct and across the stricture under endoscopic and fluoroscopic guidance with good biliary drained noted. The PD was not cannulated or injected by intention.  ADVERSE EVENT:     There were no complications. IMPRESSIONS:     1.  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  Proximal biliary dilation  RECOMMENDATIONS:     Await cytology and trend liver enzymes   Ladene Artist, MD, [facility] eSigned:  Ladene Artist, MD, [facility] 07/14/2015 4:31 PM   cc: Hospitalists,  [facility]      PATIENT NAME:  Jaime [name], Jaime [name] MR#: [id number]

## 2015-07-14 NOTE — Consult Note (Signed)
Referring Provider: No ref. provider found Primary Care Physician:  Elsie Stain, MD Primary Gastroenterologist:  Elinor Parkinson primary  Reason for Consultation:  Elevated LFT's, biliary obstruction  HPI: Jaime [name] is a 59 y.o. male with PMH of diabetes, HTN, history DKA 05/2013 recently seen at primary care physician's office for complaints of fatigue and jaundice.  Was found to have significantly abnormal labs inclusive of hemoglobin 6.9, MCV 73, platelets 593, INR 1.9, potassium 2.6, AST 143, ALT 71 and total bili 13.7 and sent over [facility]  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  There is thickening in the sigmoid colon as well.  He tells me that for the past 3 weeks he's been feeling some malaise and weakness on and off. Since he was diagnosed with his diabetes mellitus he has been trying to take control of his diet and has lost 40 pounds since diagnosis.  Says that his wife had started noticing the yellowing of his skin less than one week ago.  Denies abdominal pain, nausea, vomiting, etc.  Only drinks ETOH socially.  Labs on admission showed the following: Potassium 2.3  ALP 985 AST/ALT 160/73, Total bilirubin 14.5 Ammonia 54 LDH 883 Hemoglobin 6.9, WBC 14.7 Platelet 466  Potassium is still low this AM at 2.6 and they are trying to replace that aggressively.  Total bili 14.  CEA and CA 19-9 in process.  Hgb is 6.5 grams and he received one unit PRBC's.  No sign of GI bleeding.  His INR has also been elevated as stated above.  Received a dose of Vitamin K; INR 1.71 this AM.   Past Medical History  Diagnosis Date  . Diabetes mellitus without complication   . Hypertension     Past Surgical History  Procedure Laterality Date  . I&d extremity Left 06/19/2013    Procedure: IRRIGATION AND DEBRIDEMENT EXTREMITY;  Surgeon: Newt Minion, MD;  Location: [place];   Service: Orthopedics;  Laterality: Left;  . I&d extremity Left 07/02/2013    Procedure: IRRIGATION AND DEBRIDEMENT EXTREMITY with placement of antibiotic beads and application of wound vac;  Surgeon: Newt Minion, MD;  Location: [place];  Service: Orthopedics;  Laterality: Left;  Irrigation and Debridement Left Foot, Place Beads, Wound VAC  . 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 Authorizing Provider  amLODipine (NORVASC) 5 MG tablet Take 1 tablet (5 mg total) by mouth daily. 06/22/13  Yes Shanker Kristeen Mans, MD  aspirin EC 81 MG tablet Take 81 mg by mouth daily.   Yes Historical Provider, MD  atorvastatin (LIPITOR) 10 MG tablet Take 10 mg by mouth daily at 6 PM.   Yes Historical Provider, MD  ferrous sulfate 325 (65 FE) MG tablet Take 325 mg by mouth 2 (two) times daily with a meal.   Yes Historical Provider, MD  Insulin Detemir (LEVEMIR FLEXPEN) 100 UNIT/ML SOPN Inject 30 Units into the skin daily with breakfast.   Yes Historical Provider, MD  lisinopril (PRINIVIL,ZESTRIL) 40 MG tablet Take 1 tablet (40 mg total) by mouth daily. 06/22/13  Yes Shanker Kristeen Mans, MD  metFORMIN (GLUCOPHAGE) 1000 MG tablet Take 1,000 mg by mouth 2 (two) times daily. 08/14/13  Yes Historical Provider, MD  Insulin Pen Needle 32G X 8 MM MISC Use for insulin injections 06/22/13   Shanker Kristeen Mans, MD  metFORMIN (GLUCOPHAGE) 500 MG tablet Take 1 tablet (500 mg total) by mouth 2 (two) times daily with a meal. Patient not taking: Reported on 07/13/2015 06/22/13   Jonetta Osgood, MD  oxyCODONE-acetaminophen (ROXICET) 5-325 MG per tablet Take 1 tablet by mouth every 4 (four) hours as needed for severe pain. Patient not taking: Reported on 07/13/2015 01/09/14   Newt Minion, MD    Current Facility-Administered  Medications  Medication Dose Route Frequency Provider Last Rate Last Dose  . amLODipine (NORVASC) tablet 5 mg  5 mg Oral Daily Nita Sells, MD      . dextrose 5 % solution   Intravenous Continuous Nita Sells, MD      . insulin aspart (novoLOG) injection 0-9 Units  0-9 Units Subcutaneous 6 times per day Nita Sells, MD   0 Units at 07/13/15 1700  . insulin aspart (novoLOG) injection 3 Units  3 Units Subcutaneous 6 times per day Nita Sells, MD   3 Units at 07/13/15 1745  . insulin detemir (LEVEMIR) injection 15 Units  15 Units Subcutaneous Q breakfast Nita Sells, MD   15 Units at 07/14/15 0826  . magnesium oxide (MAG-OX) tablet 400 mg  400 mg Oral BID Nita Sells, MD   400 mg at 07/13/15 2100  . sodium chloride 0.9 % injection 3 mL  3 mL Intravenous Q12H Nita Sells, MD   3 mL at 07/13/15 2100    Allergies as of 07/13/2015  . (No Known Allergies)    Family History  Problem Relation Age of Onset  . Heart failure Mother   . Heart attack Mother   . Leukemia Father     History   Social History  . Marital Status: Married    Spouse Name: N/A  . Number of Children: N/A  . Years of Education: N/A   Occupational History  . Not on file.   Social History Main Topics  . Smoking status: Never Smoker   . Smokeless tobacco: Never Used  . Alcohol Use: Yes     Comment: social drinker  . Drug Use: No  . Sexual Activity: Yes   Other Topics Concern  . Not on file   Social History Narrative   Lives with wife at home    Review of Systems: Ten point ROS is O/W negative except as mentioned in HPI.  Physical Exam: Vital signs in last 24 hours: Temp:  [97 F (36.1 C)-98.6 F (37 C)] 98.6 F (37 C) (07/14 0813) Pulse Rate:  [82-94] 94 (07/14 0813) Resp:  [15-22] 18 (07/14 0813) BP: (91-157)/(52-87) 143/67 mmHg (07/14 0813) SpO2:  [97 %-99 %] 98 % (07/14 0813) Last BM Date: 07/13/15 General:  Alert, chronically ill-appearing,  in NAD.  Jaundice noted. Head:  Normocephalic and atraumatic. Eyes:  Scleral icterus present. Ears:  Normal auditory acuity. Mouth:  No deformity or lesions.   Lungs:  Clear throughout to auscultation.  No wheezes, crackles, or rhonchi.   Heart:  Regular rate and rhythm; no murmurs, clicks, rubs, or gallops. Abdomen:  Soft, non-distended.  BS present.  Non-tender.  Rectal:  Deferred  Msk:  Symmetrical without gross deformities. Pulses:  Normal pulses noted. Extremities:  Without clubbing or edema. Neurologic:  Alert and  oriented x4;  grossly normal neurologically. Skin:  Intact without significant lesions or rashes. Psych:  Alert and cooperative. Normal  mood and affect.  Intake/Output from previous day: 07/13 0701 - 07/14 0700 In: 965 [P.O.:600; Blood:365] Out: 200 [Urine:200]  Lab Results:  Recent Labs  07/13/15 1330 07/13/15 2025 07/14/15 0522  WBC 14.7* 14.1* 10.5  HGB 6.9* 6.4* 6.5*  HCT 24.7* 23.4* 22.7*  PLT 466* 511* 408*   BMET  Recent Labs  07/13/15 1330 07/14/15 0522  NA 139 136  K 2.3* 2.6*  CL 104 102  CO2 24 23  GLUCOSE 70 108*  BUN 16 14  CREATININE 0.98 0.71  CALCIUM 8.6* 8.1*   LFT  Recent Labs  07/14/15 0522  PROT 6.0*  ALBUMIN 1.8*  AST 128*  ALT 63  ALKPHOS 870*  BILITOT 14.0*   PT/INR  Recent Labs  07/13/15 1456 07/14/15 0522  LABPROT 20.9* 20.0*  INR 1.80* 1.71*   Hepatitis Panel  Recent Labs  07/13/15 1456  HEPBSAG Negative  HCVAB <0.1  HEPAIGM Negative  HEPBIGM Negative    Studies/Results: Ct Abdomen Pelvis W Contrast  07/13/2015   CLINICAL DATA:  Jaundice fatigue for 3 weeks  EXAM: CT ABDOMEN AND PELVIS WITH CONTRAST  TECHNIQUE: Multidetector CT imaging of the abdomen and pelvis was performed using the standard protocol following bolus administration of intravenous contrast.  CONTRAST:  162mL OMNIPAQUE IOHEXOL 300 MG/ML  SOLN  COMPARISON:  None.  FINDINGS: Lung bases demonstrate a moderate right-sided pleural  effusion. Additionally multiple pulmonary nodules are identified. The largest of these in the left lower lobe measures 13 mm. The largest in the right lower lobe measures 19 mm.  The liver demonstrates diffuse involvement with multiple peripherally enhancing lesions. The largest of these near the dome of the liver measures approximately 13.6 x 10.6 cm in greatest transverse and AP dimensions respectively. Some biliary ductal dilatation is noted. Porta hepatis lymphadenopathy is noted as well as portacaval lymphadenopathy. Spleen is within normal limits. The pancreas is somewhat atrophic. The adrenal glands are within normal limits with the exception of a 2.7 cm lesion within the right adrenal gland best seen on image number 30 of series 201. Although this may represent an adenoma the possibility of metastatic disease deserves consideration given the changes in the lungs and liver. The kidneys are well visualized bilaterally. A normal excretion pattern is seen. Small nonobstructing right renal stone is noted. No focal mass lesion is noted. Mild ascites is noted within the abdomen and pelvis. The bladder is partially distended. No pelvic mass lesion is seen. Diverticular change of the colon is noted. In the midportion of the sigmoid colon there is some wall thickening identified. It would be difficult to exclude an underlying lesion on the basis of this exam. This areas best seen on images 65-67 of series 201 and on the sagittal projections and image number 113 of series 204.  The bony structures demonstrate degenerative change of the lumbar spine. No definitive lytic or blastic lesions are seen.  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 There is an associated right-sided pleural effusion and ascites identified.  Thickening of the sigmoid colon which may represent a focal lesion. Direct visualization may be helpful.  Right adrenal gland lesion. This may represent an adenoma  although metastatic disease is not excluded.  Right renal calculus.   Electronically Signed   By: Inez Catalina M.D.   On: 07/13/2015 20:34    IMPRESSION:  -Malignant biliary obstruction, unknown primary:  LFT's elevated with total bili 14.  CT shows metastatic disease throughout liver and lungs. -Hypokalemia:  Primary service replacing this aggressively before procedure today. -Acute on chronic anemia:  No sign of GI bleeding.  S/p one unit PRBC's. -Coagulopathy:  Has received one dose of Vitamin K.  INR 1.7 this AM.  PLAN: -CEA and CA 19-9 pending. -For ERCP with brushings for cytology and stent placement today.  Antibiotics and indomethacin ordered. -May need flex sig at some point to evaluate area of question on CT scan in sig colon. -Transfuse prn.   Tanieka Pownall D.  07/14/2015, 9:09 AM  Pager number [phone]

## 2015-07-14 NOTE — Progress Notes (Signed)
Patient requests for graham crackers,diet is clear liquid.Textpaged MD on call.Diet changed to carb modified,but on call MD wants it to be clarified with GI if okay to advance diet. Keri Veale, Wonda Cheng, Therapist, sports

## 2015-07-14 NOTE — Progress Notes (Signed)
Clarified with Dr. Deatra Ina re: diet change from clear liquid to carb modified. Per MD,it's okay as long as patient is not nauseous,which patient is not,relayed to K. Government social research officer. Joell Buerger, Wonda Cheng, Therapist, sports

## 2015-07-14 NOTE — Transfer of Care (Signed)
Immediate Anesthesia Transfer of Care Note  Patient: Jaime [name]  Procedure(s) Performed: Procedure(s): ENDOSCOPIC RETROGRADE CHOLANGIOPANCREATOGRAPHY (ERCP) (N/A)  Patient Location: Endoscopy Unit  Anesthesia Type:General  Level of Consciousness: awake and alert   Airway & Oxygen Therapy: Patient Spontanous Breathing and Patient connected to nasal cannula oxygen  Post-op Assessment: Report given to RN and Post -op Vital signs reviewed and stable  Post vital signs: Reviewed and stable  Last Vitals:  Filed Vitals:   07/14/15 1432  BP: 169/86  Pulse: 87  Temp: 36.8 C  Resp: 18    Complications: No apparent anesthesia complications

## 2015-07-14 NOTE — Anesthesia Procedure Notes (Signed)
Procedure Name: Intubation Date/Time: 07/14/2015 3:44 PM Performed by: Clearnce Sorrel Pre-anesthesia Checklist: Patient identified, Emergency Drugs available, Suction available, Patient being monitored and Timeout performed Patient Re-evaluated:Patient Re-evaluated prior to inductionOxygen Delivery Method: Circle system utilized Preoxygenation: Pre-oxygenation with 100% oxygen Intubation Type: IV induction Ventilation: Mask ventilation without difficulty Laryngoscope Size: Mac and 3 Grade View: Grade I Tube type: Oral Tube size: 7.5 mm Number of attempts: 1 Airway Equipment and Method: Stylet Placement Confirmation: ETT inserted through vocal cords under direct vision,  positive ETCO2 and breath sounds checked- equal and bilateral Secured at: 23 cm Tube secured with: Tape Dental Injury: Teeth and Oropharynx as per pre-operative assessment

## 2015-07-15 DIAGNOSIS — C7800 Secondary malignant neoplasm of unspecified lung: Secondary | ICD-10-CM

## 2015-07-15 LAB — CBC
HCT: 23 % — ABNORMAL LOW (ref 39.0–52.0)
HEMOGLOBIN: 6.7 g/dL — AB (ref 13.0–17.0)
MCH: 22.5 pg — ABNORMAL LOW (ref 26.0–34.0)
MCHC: 29.1 g/dL — AB (ref 30.0–36.0)
MCV: 77.2 fL — ABNORMAL LOW (ref 78.0–100.0)
Platelets: 361 10*3/uL (ref 150–400)
RBC: 2.98 MIL/uL — ABNORMAL LOW (ref 4.22–5.81)
RDW: 20.9 % — AB (ref 11.5–15.5)
WBC: 9.6 10*3/uL (ref 4.0–10.5)

## 2015-07-15 LAB — GLUCOSE, CAPILLARY
GLUCOSE-CAPILLARY: 78 mg/dL (ref 65–99)
Glucose-Capillary: 61 mg/dL — ABNORMAL LOW (ref 65–99)
Glucose-Capillary: 77 mg/dL (ref 65–99)
Glucose-Capillary: 130 mg/dL — ABNORMAL HIGH (ref 65–99)
Glucose-Capillary: 161 mg/dL — ABNORMAL HIGH (ref 65–99)
Glucose-Capillary: 172 mg/dL — ABNORMAL HIGH (ref 65–99)
Glucose-Capillary: 173 mg/dL — ABNORMAL HIGH (ref 65–99)

## 2015-07-15 LAB — COMPREHENSIVE METABOLIC PANEL
ALT: 58 U/L (ref 17–63)
ANION GAP: 11 (ref 5–15)
AST: 112 U/L — ABNORMAL HIGH (ref 15–41)
Albumin: 1.7 g/dL — ABNORMAL LOW (ref 3.5–5.0)
Alkaline Phosphatase: 832 U/L — ABNORMAL HIGH (ref 38–126)
BILIRUBIN TOTAL: 13.9 mg/dL — AB (ref 0.3–1.2)
BUN: 12 mg/dL (ref 6–20)
CO2: 23 mmol/L (ref 22–32)
Calcium: 8.2 mg/dL — ABNORMAL LOW (ref 8.9–10.3)
Chloride: 102 mmol/L (ref 101–111)
Creatinine, Ser: 0.7 mg/dL (ref 0.61–1.24)
GFR calc Af Amer: >60 mL/min (ref >60)
GFR calc non Af Amer: >60 mL/min (ref >60)
Glucose, Bld: 108 mg/dL — ABNORMAL HIGH (ref 65–99)
Potassium: 3 mmol/L — ABNORMAL LOW (ref 3.5–5.1)
Sodium: 136 mmol/L (ref 135–145)
TOTAL PROTEIN: 6 g/dL — AB (ref 6.5–8.1)

## 2015-07-15 LAB — HEMOGLOBIN AND HEMATOCRIT, BLOOD
HCT: 24.8 % — ABNORMAL LOW (ref 39.0–52.0)
HCT: 27.9 % — ABNORMAL LOW (ref 39.0–52.0)
HEMOGLOBIN: 8 g/dL — AB (ref 13.0–17.0)
Hemoglobin: 6.9 g/dL — CL (ref 13.0–17.0)

## 2015-07-15 LAB — HIV ANTIBODY (ROUTINE TESTING W REFLEX): HIV Screen 4th Generation wRfx: NONREACTIVE

## 2015-07-15 LAB — CANCER ANTIGEN 19-9: CA 19 9: 6313 U/mL — AB (ref 0–35)

## 2015-07-15 LAB — CEA: CEA: 8250 ng/mL — ABNORMAL HIGH (ref 0.0–4.7)

## 2015-07-15 LAB — PREPARE RBC (CROSSMATCH)

## 2015-07-15 LAB — HAPTOGLOBIN: Haptoglobin: 226 mg/dL — ABNORMAL HIGH (ref 34–200)

## 2015-07-15 MED ORDER — SODIUM CHLORIDE 0.9 % IV SOLN
Freq: Once | INTRAVENOUS | Status: AC
  Administered 2015-07-15: 13:00:00 via INTRAVENOUS

## 2015-07-15 MED ORDER — PEG 3350-KCL-NA BICARB-NACL 420 G PO SOLR
4000.0000 mL | Freq: Once | ORAL | Status: DC
  Filled 2015-07-15: qty 4000

## 2015-07-15 NOTE — Progress Notes (Signed)
Inpatient Diabetes Program Recommendations  AACE/ADA: New Consensus Statement on Inpatient Glycemic Control (2013)  Target Ranges:  Prepandial:   less than 140 mg/dL      Peak postprandial:   less than 180 mg/dL (1-2 hours)      Critically ill patients:  140 - 180 mg/dL   Results for CAIGE, ALMEDA (MRN [id number]) as of 07/15/2015 13:06  Ref. Range 07/14/2015 04:40 07/14/2015 07:38 07/14/2015 12:32 07/14/2015 17:33 07/14/2015 20:27 07/15/2015 00:27 07/15/2015 07:46 07/15/2015 11:43  Glucose-Capillary Latest Ref Range: 65-99 mg/dL 105 (H) 120 (H) 100 (H) 61 (L) 78 77 130 (H) 161 (H)    Diabetes history: DM2 Outpatient Diabetes medications: Levemir 30 units QAM with breakfast, Metformin 1000 BID Current orders for Inpatient glycemic control: Novolog 0-9 units TID with meals  Inpatient Diabetes Program Recommendations Insulin - Basal: Noted that Levemir 15 units daily was discontinued. Noted glucose ranged from 61-161 over the past 24 hours. Recommend reordering Levemir at lower dose; Levemir 10 units QHS (starting today).  Thanks, Barnie Alderman, RN, MSN, CCRN, CDE Diabetes Coordinator Inpatient Diabetes Program [phone] (Team Pager from [place] to [place]) [phone] ([place] office) [phone] [facility] office) [phone] [facility] office)

## 2015-07-15 NOTE — Progress Notes (Signed)
[facility] Progress Note  Subjective:  Feels pretty good this morning.  Eating well.  No abdominal pain.  No reports of GI bleeding, but Hgb down quite a bit again this AM to 6.7 grams.  Objective:  Vital signs in last 24 hours: Temp:  [97.7 F (36.5 C)-98.7 F (37.1 C)] 98.7 F (37.1 C) (07/15 0300) Pulse Rate:  [76-87] 80 (07/15 0300) Resp:  [16-29] 18 (07/15 0300) BP: (126-169)/(60-86) 126/60 mmHg (07/15 0300) SpO2:  [92 %-99 %] 96 % (07/15 0300) Weight:  [263 lb 6.4 oz (119.477 kg)] 263 lb 6.4 oz (119.477 kg) (07/15 0300) Last BM Date: 07/13/15 General:  Alert, Chronically ill-appearing, in NAD Heart:  Regular rate and rhythm; no murmurs Pulm:  CTAB.   Abdomen:  Soft, non-distended. Normal bowel sounds.  Non-tender. Extremities:  Without edema. Neurologic:  Alert and  oriented x 4;  grossly normal neurologically. Psych:  Alert and cooperative. Normal mood and affect.  Intake/Output from previous day: 07/14 0701 - 07/15 0700 In: 1938.8 [P.O.:840; I.V.:1098.8] Out: 1610 [Urine:1650]  Lab Results:  Recent Labs  07/13/15 2025 07/14/15 0522 07/14/15 1429 07/15/15 0408  WBC 14.1* 10.5  --  9.6  HGB 6.4* 6.5* 9.2* 6.7*  HCT 23.4* 22.7* 27.0* 23.0*  PLT 511* 408*  --  361   BMET  Recent Labs  07/14/15 0522 07/14/15 1206 07/14/15 1429 07/15/15 0408  NA 136 135 139 136  K 2.6* 2.6* 3.5 3.0*  CL 102 102  --  102  CO2 23 22  --  23  GLUCOSE 108* 99 67 108*  BUN 14 14  --  12  CREATININE 0.71 0.77  --  0.70  CALCIUM 8.1* 8.3*  --  8.2*   LFT  Recent Labs  07/15/15 0408  PROT 6.0*  ALBUMIN 1.7*  AST 112*  ALT 58  ALKPHOS 832*  BILITOT 13.9*   PT/INR  Recent Labs  07/13/15 1456 07/14/15 0522  LABPROT 20.9* 20.0*  INR 1.80* 1.71*   Hepatitis Panel  Recent Labs  07/13/15 1456  HEPBSAG Negative  HCVAB <0.1  HEPAIGM Negative  HEPBIGM Negative    Ct Head W Wo Contrast  07/14/2015   CLINICAL DATA:  Liver and lung  neoplasm.  EXAM: CT HEAD WITHOUT AND WITH CONTRAST  TECHNIQUE: Contiguous axial images were obtained from the base of the skull through the vertex without and with intravenous contrast  CONTRAST:  31mL OMNIPAQUE IOHEXOL 300 MG/ML  SOLN  COMPARISON:  CT of the chest, abdomen, and pelvis.  FINDINGS: Minimal atrophy is within normal limits for age. No acute infarct, hemorrhage, or mass lesion is present. The ventricles are of normal size. No significant extraaxial fluid collection is present.  The paranasal sinuses and left mastoid air cells are clear. There is fluid in the right middle ear cavity and mastoid air cells without osseous destruction. The left lens is replaced. The globes and orbits are otherwise intact. The subcutaneous nodule in the posterior right neck measures 9 x 5 mm, likely inflammatory nodule.  The postcontrast images demonstrate no pathologic enhancement.  IMPRESSION: 1. Normal CT appearance of the brain for age. No acute item al day or evidence for metastatic disease. 2. Right middle ear and mastoid effusion. No obstructing nasopharyngeal lesion is evident.   Electronically Signed   By: San Morelle M.D.   On: 07/14/2015 19:59   Ct Chest W Contrast  07/14/2015   CLINICAL DATA:  59 yr old male with c/o  fatigue x 3 weeks, jaundice x 1 week. Last night CT abd and pelvis showed liver lesions and lung lesions. This is for cancer staging. Hx: DM2, HTN, anemia, elevated LFT;s  EXAM: CT CHEST WITH CONTRAST  TECHNIQUE: Multidetector CT imaging of the chest was performed during intravenous contrast administration.  CONTRAST:  38mL OMNIPAQUE IOHEXOL 300 MG/ML  SOLN  COMPARISON:  Abdomen pelvis CT, 07/13/2015. Chest radiograph, 07/01/2013.  FINDINGS: Thoracic inlet: Vague low-density area in the left lobe of the thyroid may reflect a sub cm nodule. Thyroid otherwise unremarkable. No neck base masses or adenopathy.  Mediastinum and hila: Heart normal in size. Mild coronary artery calcifications.  Great vessels normal in caliber. Thoracic aorta unremarkable. Mildly enlarged right subcarinal lymph node measuring 11 mm in short axis. There are additional prominent and enlarged lower posterior mediastinal lymph nodes, largest lying to the right of the lower esophagus measuring 14 mm in short axis. No hilar masses or enlarged lymph nodes.  Lungs and pleura: Moderate right pleural effusion. Multiple bilateral pulmonary nodules. The 2 largest on the left are a left lower lobe nodule measuring 13 mm in diameter, image 35, series 3 and a left upper lobe nodule measuring 12 mm, image 23. On the right, the largest nodule is in the posterior right lower lobe measuring 19 mm. There is mild subsegmental atelectasis in the lung bases, greatest in the right lower lobe adjacent to the pleural effusion. No lung consolidation or edema.  Limited upper abdomen: Numerous liver masses consistent with widespread metastatic disease. This enlarges the liver. Small amount of ascites. Right adrenal mass. Multiple prominent upper abdominal lymph nodes. Findings stable from the recent abdomen pelvis CT.  Musculoskeletal:  No osteoblastic or osteolytic lesions.  IMPRESSION: 1. Numerous metastatic lesions in the lungs. There is no convincing lung primary malignancy. Metastatic adenopathy is seen in the lower posterior mediastinum. 2. Moderate right pleural effusion. 3. No acute findings in the lungs. 4. Extensive liver metastatic disease as described on the recent prior study. Right adrenal mass is also likely metastatic disease. Metastatic upper abdominal adenopathy.   Electronically Signed   By: Lajean Manes M.D.   On: 07/14/2015 20:00   Ct Abdomen Pelvis W Contrast  07/13/2015   CLINICAL DATA:  Jaundice fatigue for 3 weeks  EXAM: CT ABDOMEN AND PELVIS WITH CONTRAST  TECHNIQUE: Multidetector CT imaging of the abdomen and pelvis was performed using the standard protocol following bolus administration of intravenous contrast.   CONTRAST:  127mL OMNIPAQUE IOHEXOL 300 MG/ML  SOLN  COMPARISON:  None.  FINDINGS: Lung bases demonstrate a moderate right-sided pleural effusion. Additionally multiple pulmonary nodules are identified. The largest of these in the left lower lobe measures 13 mm. The largest in the right lower lobe measures 19 mm.  The liver demonstrates diffuse involvement with multiple peripherally enhancing lesions. The largest of these near the dome of the liver measures approximately 13.6 x 10.6 cm in greatest transverse and AP dimensions respectively. Some biliary ductal dilatation is noted. Porta hepatis lymphadenopathy is noted as well as portacaval lymphadenopathy. Spleen is within normal limits. The pancreas is somewhat atrophic. The adrenal glands are within normal limits with the exception of a 2.7 cm lesion within the right adrenal gland best seen on image number 30 of series 201. Although this may represent an adenoma the possibility of metastatic disease deserves consideration given the changes in the lungs and liver. The kidneys are well visualized bilaterally. A normal excretion pattern is seen. Small nonobstructing right  renal stone is noted. No focal mass lesion is noted. Mild ascites is noted within the abdomen and pelvis. The bladder is partially distended. No pelvic mass lesion is seen. Diverticular change of the colon is noted. In the midportion of the sigmoid colon there is some wall thickening identified. It would be difficult to exclude an underlying lesion on the basis of this exam. This areas best seen on images 65-67 of series 201 and on the sagittal projections and image number 113 of series 204.  The bony structures demonstrate degenerative change of the lumbar spine. No definitive lytic or blastic lesions are seen.  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 There is an associated right-sided pleural effusion and ascites identified.  Thickening of the sigmoid colon  which may represent a focal lesion. Direct visualization may be helpful.  Right adrenal gland lesion. This may represent an adenoma although metastatic disease is not excluded.  Right renal calculus.   Electronically Signed   By: Inez Catalina M.D.   On: 07/13/2015 20:34   Dg Ercp  07/14/2015   CLINICAL DATA:  Bile duct obstruction  EXAM: ERCP  TECHNIQUE: Multiple spot images obtained with the fluoroscopic device and submitted for interpretation post-procedure.  FLUOROSCOPY TIME:  Radiation Exposure Index (as provided by the fluoroscopic device):  If the device does not provide the exposure index:  Fluoroscopy Time:  2 minutes and 49 seconds  Number of Acquired Images:  13  COMPARISON:  None.  FINDINGS: Multiple images demonstrate cannulation of the common bile duct and contrast filling the biliary tree. A biliary stent has been placed.  IMPRESSION: ERCP and stent placement.  These images were submitted for radiologic interpretation only. Please see the procedural report for the amount of contrast and the fluoroscopy time utilized.   Electronically Signed   By: Marybelle Killings M.D.   On: 07/14/2015 16:46    Assessment / Plan: -Malignant biliary obstruction, unknown primary:S/p ERCP on 7/14 with severe stricture in the proximal common bile duct; brushings obtained and plastic stent placed.  LFT's still elevated with total bili 13.9. CT shows metastatic disease throughout liver and lungs.  CEA and CA 19-9 both very elevated.   -Hypokalemia: Primary service replacing and it has improved some. -Acute on chronic anemia: No sign of GI bleeding. S/p one unit PRBC's and receiving a second unit today for Hgb 6.7 grams again this morning. -Coagulopathy: Given Vitamin K.  *Await results of cytology from brushings. *Trend LFT's. *May need flex sig at some point to evaluate area of question on CT scan in sig colon. *Transfuse prn.   LOS: 2 days   Nelani Schmelzle D.  07/15/2015, 9:07 AM  Pager number  [phone]

## 2015-07-15 NOTE — Progress Notes (Signed)
PROGRESS NOTE  Jaime [name] WER:[id number] DOB: November 06, 1956 DOA: 07/13/2015 PCP: Elsie Stain, MD  HPI/Recap of past 50 hours: 59 year old male with past history of hypertension and diabetes mellitus type 2 diagnosed 2 years ago underwent workup by PCP for several weeks of fatigue. Patient had several weeks of fatigue and then wife started noticing that he appeared to be somewhat jaundiced, so patient went to see his PCP on 7/13. Lab work done noted a critical anemia of 6.4 (patient previously healthy with normal labs 5 months prior) and so he was sent over to the emergency room for further evaluation. In the emergency room, patient found to have signs of biliary obstruction with a bilirubin of 14.5, elevated alkaline phosphatase, mild coagulopathy and confirmed low hemoglobin. Hospitalists were called for further evaluation and admission.  Patient transfused 2 units packed red blood cells. Gastroenterology consulted. CT scan of abdomen and pelvis noted multiple liver lesions and some ductal dilatation but no signs of frank biliary obstruction. Also noted was some inflammation around sigmoid area and given liver lesions,? Colon cancer. Multiple lesions also seen in both lungs. Anemia workup noted signs consistent with a hemolyzing  Process.  Patient this morning with no complaints. Denies any headaches, chest pain or shortness of breath or abdominal pain. No confusion. Discussed with patient, his wife and mother-in-law about CT findings obviously were quite distressed. Seen by gastroenterology and patient underwent ERCP, results pending  Subjective: Pt without itching, abd pain or sob. No complaints.  Assessment/Plan: Active Problems:   Diabetes mellitus, type II: Stable. Resumed PO and tolerating well   Hypertension - stable   Liver failure, acute: Secondary to liver metastases. Cont to monitor INR. Avoid Tylenol.   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 Question if lymphoma would present this way. Discussed with GI who favored liver biopsy versus colonoscopy for tissue sample. Tumor marker CA 19-9 and CEA levels both elevated.    Other pancytopenia including anemia, suspected hemolytic: Transfuse 2 units packed red blood cells. No signs of active bleeding. Hgb again just below 7 this AM. Transfuse one unit PRBC    Hypokalemia: Magnesium level normal. Aggressively her placed potassium   Code Status: Full code  Family Communication: Pt in room, wife at bedside  Disposition Plan: Awaiting further results for disposition   Consultants:  Gastroenterology  Procedures:  ERCP 7/14: Results pending  Antibiotics:  None   Objective: BP 139/68 mmHg  Pulse 89  Temp(Src) 98.1 F (36.7 C) (Oral)  Resp 18  Ht 6\' 4"  (1.93 m)  Wt 119.477 kg (263 lb 6.4 oz)  BMI 32.08 kg/m2  SpO2 97%  Intake/Output Summary (Last 24 hours) at 07/15/15 1439 Last data filed at 07/15/15 1300  Gross per 24 hour  Intake 2916.25 ml  Output   1800 ml  Net 1116.25 ml   Filed Weights   07/15/15 0300  Weight: 119.477 kg (263 lb 6.4 oz)    Exam:   General:  Alert and oriented 3, no acute distress, jaundiced  Cardiovascular: Regular rate and rhythm, S1-S2  Respiratory: Clear to auscultation bilaterally  Abdomen: Soft, nontender, nondistended, hypoactive bowel sounds  Musculoskeletal: no clubbing cyanosis or edema   Data Reviewed: Basic Metabolic Panel:  Recent Labs Lab 07/13/15 1330 07/13/15 1456 07/14/15 0522 07/14/15 1206  07/14/15 1429 07/15/15 0408  NA 139  --  136 135 139 136  K 2.3*  --  2.6* 2.6* 3.5 3.0*  CL 104  --  102 102  --  102  CO2 24  --  23 22  --  23  GLUCOSE 70  --  108* 99 67 108*  BUN 16  --  14 14  --   12  CREATININE 0.98  --  0.71 0.77  --  0.70  CALCIUM 8.6*  --  8.1* 8.3*  --  8.2*  MG  --  1.4*  --   --   --   --    Liver Function Tests:  Recent Labs Lab 07/13/15 1330 07/14/15 0522 07/15/15 0408  AST 164* 128* 112*  ALT 73* 63 58  ALKPHOS 985* 870* 832*  BILITOT 14.5* 14.0* 13.9*  PROT 6.9 6.0* 6.0*  ALBUMIN 2.0* 1.8* 1.7*   No results for input(s): LIPASE, AMYLASE in the last 168 hours.  Recent Labs Lab 07/13/15 1410  AMMONIA 54*   CBC:  Recent Labs Lab 07/13/15 1330 07/13/15 2025 07/14/15 0522 07/14/15 1429 07/15/15 0408 07/15/15 1105  WBC 14.7* 14.1* 10.5  --  9.6  --   HGB 6.9* 6.4* 6.5* 9.2* 6.7* 6.9*  HCT 24.7* 23.4* 22.7* 27.0* 23.0* 24.8*  MCV 76.5* 77.7* 76.4*  --  77.2*  --   PLT 466* 511* 408*  --  361  --    Cardiac Enzymes:   No results for input(s): CKTOTAL, CKMB, CKMBINDEX, TROPONINI in the last 168 hours. BNP (last 3 results) No results for input(s): BNP in the last 8760 hours.  ProBNP (last 3 results) No results for input(s): PROBNP in the last 8760 hours.  CBG:  Recent Labs Lab 07/14/15 1733 07/14/15 2027 07/15/15 0027 07/15/15 0746 07/15/15 1143  GLUCAP 61* 78 77 130* 161*    No results found for this or any previous visit (from the past 240 hour(s)).   Studies: Ct Head W Wo Contrast  07/14/2015   CLINICAL DATA:  Liver and lung neoplasm.  EXAM: CT HEAD WITHOUT AND WITH CONTRAST  TECHNIQUE: Contiguous axial images were obtained from the base of the skull through the vertex without and with intravenous contrast  CONTRAST:  6mL OMNIPAQUE IOHEXOL 300 MG/ML  SOLN  COMPARISON:  CT of the chest, abdomen, and pelvis.  FINDINGS: Minimal atrophy is within normal limits for age. No acute infarct, hemorrhage, or mass lesion is present. The ventricles are of normal size. No significant extraaxial fluid collection is present.  The paranasal sinuses and left mastoid air cells are clear. There is fluid in the right middle ear cavity and  mastoid air cells without osseous destruction. The left lens is replaced. The globes and orbits are otherwise intact. The subcutaneous nodule in the posterior right neck measures 9 x 5 mm, likely inflammatory nodule.  The postcontrast images demonstrate no pathologic enhancement.  IMPRESSION: 1. Normal CT appearance of the brain for age. No acute item al day or evidence for metastatic disease. 2. Right middle ear and mastoid effusion. No obstructing nasopharyngeal lesion is evident.   Electronically Signed   By: San Morelle M.D.   On: 07/14/2015 19:59   Ct Chest W Contrast  07/14/2015   CLINICAL DATA:  59 yr old male with c/o fatigue x 3 weeks, jaundice x 1 week. Last night CT abd and pelvis showed liver lesions and lung lesions. This is for cancer staging. Hx: DM2, HTN,  anemia, elevated LFT;s  EXAM: CT CHEST WITH CONTRAST  TECHNIQUE: Multidetector CT imaging of the chest was performed during intravenous contrast administration.  CONTRAST:  44mL OMNIPAQUE IOHEXOL 300 MG/ML  SOLN  COMPARISON:  Abdomen pelvis CT, 07/13/2015. Chest radiograph, 07/01/2013.  FINDINGS: Thoracic inlet: Vague low-density area in the left lobe of the thyroid may reflect a sub cm nodule. Thyroid otherwise unremarkable. No neck base masses or adenopathy.  Mediastinum and hila: Heart normal in size. Mild coronary artery calcifications. Great vessels normal in caliber. Thoracic aorta unremarkable. Mildly enlarged right subcarinal lymph node measuring 11 mm in short axis. There are additional prominent and enlarged lower posterior mediastinal lymph nodes, largest lying to the right of the lower esophagus measuring 14 mm in short axis. No hilar masses or enlarged lymph nodes.  Lungs and pleura: Moderate right pleural effusion. Multiple bilateral pulmonary nodules. The 2 largest on the left are a left lower lobe nodule measuring 13 mm in diameter, image 35, series 3 and a left upper lobe nodule measuring 12 mm, image 23. On the right,  the largest nodule is in the posterior right lower lobe measuring 19 mm. There is mild subsegmental atelectasis in the lung bases, greatest in the right lower lobe adjacent to the pleural effusion. No lung consolidation or edema.  Limited upper abdomen: Numerous liver masses consistent with widespread metastatic disease. This enlarges the liver. Small amount of ascites. Right adrenal mass. Multiple prominent upper abdominal lymph nodes. Findings stable from the recent abdomen pelvis CT.  Musculoskeletal:  No osteoblastic or osteolytic lesions.  IMPRESSION: 1. Numerous metastatic lesions in the lungs. There is no convincing lung primary malignancy. Metastatic adenopathy is seen in the lower posterior mediastinum. 2. Moderate right pleural effusion. 3. No acute findings in the lungs. 4. Extensive liver metastatic disease as described on the recent prior study. Right adrenal mass is also likely metastatic disease. Metastatic upper abdominal adenopathy.   Electronically Signed   By: Lajean Manes M.D.   On: 07/14/2015 20:00   Dg Ercp  07/14/2015   CLINICAL DATA:  Bile duct obstruction  EXAM: ERCP  TECHNIQUE: Multiple spot images obtained with the fluoroscopic device and submitted for interpretation post-procedure.  FLUOROSCOPY TIME:  Radiation Exposure Index (as provided by the fluoroscopic device):  If the device does not provide the exposure index:  Fluoroscopy Time:  2 minutes and 49 seconds  Number of Acquired Images:  13  COMPARISON:  None.  FINDINGS: Multiple images demonstrate cannulation of the common bile duct and contrast filling the biliary tree. A biliary stent has been placed.  IMPRESSION: ERCP and stent placement.  These images were submitted for radiologic interpretation only. Please see the procedural report for the amount of contrast and the fluoroscopy time utilized.   Electronically Signed   By: Marybelle Killings M.D.   On: 07/14/2015 16:46    Scheduled Meds: . amLODipine  5 mg Oral Daily  .  insulin aspart  0-9 Units Subcutaneous TID WC  . magnesium oxide  400 mg Oral BID  . potassium chloride  40 mEq Oral TID  . sodium chloride  3 mL Intravenous Q12H    Continuous Infusions:    Bartholomew Ramesh K  [facility] Pager [phone]. If 7PM-7AM, please contact night-coverage at [email or web address], password [facility] 07/15/2015, 2:39 PM  LOS: 2 days

## 2015-07-16 ENCOUNTER — Encounter (HOSPITAL_COMMUNITY)
Admission: EM | Disposition: A | Discharge status: Home / Self Care | Payer: Self-pay | Source: Home / Self Care | Attending: Internal Medicine

## 2015-07-16 LAB — GLUCOSE, CAPILLARY
Glucose-Capillary: 148 mg/dL — ABNORMAL HIGH (ref 65–99)
Glucose-Capillary: 164 mg/dL — ABNORMAL HIGH (ref 65–99)
Glucose-Capillary: 140 mg/dL — ABNORMAL HIGH (ref 65–99)
Glucose-Capillary: 125 mg/dL — ABNORMAL HIGH (ref 65–99)

## 2015-07-16 LAB — TYPE AND SCREEN
ABO/RH(D): A POS
ANTIBODY SCREEN: NEGATIVE
UNIT DIVISION: 0
UNIT DIVISION: 0

## 2015-07-16 LAB — COMPREHENSIVE METABOLIC PANEL
ALT: 55 U/L (ref 17–63)
AST: 91 U/L — AB (ref 15–41)
Albumin: 1.7 g/dL — ABNORMAL LOW (ref 3.5–5.0)
Alkaline Phosphatase: 769 U/L — ABNORMAL HIGH (ref 38–126)
Anion gap: 7 (ref 5–15)
BUN: 11 mg/dL (ref 6–20)
CALCIUM: 8.1 mg/dL — AB (ref 8.9–10.3)
CO2: 25 mmol/L (ref 22–32)
Chloride: 105 mmol/L (ref 101–111)
Creatinine, Ser: 0.74 mg/dL (ref 0.61–1.24)
GFR calc Af Amer: >60 mL/min (ref >60)
GFR calc non Af Amer: >60 mL/min (ref >60)
Glucose, Bld: 148 mg/dL — ABNORMAL HIGH (ref 65–99)
Potassium: 3.9 mmol/L (ref 3.5–5.1)
Sodium: 137 mmol/L (ref 135–145)
Total Bilirubin: 13 mg/dL — ABNORMAL HIGH (ref 0.3–1.2)
Total Protein: 5.9 g/dL — ABNORMAL LOW (ref 6.5–8.1)

## 2015-07-16 LAB — CBC
HCT: 25 % — ABNORMAL LOW (ref 39.0–52.0)
Hemoglobin: 7.4 g/dL — ABNORMAL LOW (ref 13.0–17.0)
MCH: 23.3 pg — ABNORMAL LOW (ref 26.0–34.0)
MCHC: 29.6 g/dL — AB (ref 30.0–36.0)
MCV: 78.6 fL (ref 78.0–100.0)
Platelets: 369 10*3/uL (ref 150–400)
RBC: 3.18 MIL/uL — ABNORMAL LOW (ref 4.22–5.81)
RDW: 20.5 % — AB (ref 11.5–15.5)
WBC: 10.6 10*3/uL — ABNORMAL HIGH (ref 4.0–10.5)

## 2015-07-16 SURGERY — COLONOSCOPY
Anesthesia: Moderate Sedation

## 2015-07-16 MED ORDER — GI COCKTAIL ~~LOC~~
30.0000 mL | Freq: Three times a day (TID) | ORAL | Status: DC | PRN
  Administered 2015-07-16: 30 mL via ORAL
  Filled 2015-07-16: qty 30

## 2015-07-16 NOTE — Progress Notes (Signed)
Patient ID: Jaime [name], male   DOB: 12-19-1956, 59 y.o.   MRN: [id number]  [facility] PROGRESS NOTE  Jaime [name] GQB:[id number] DOB: 09-01-1956 DOA: 07/13/2015 PCP: Elsie Stain, MD   Brief narrative:    59 year old male with past history of hypertension and diabetes mellitus type 2 diagnosed 2 years ago, underwent workup by PCP for several weeks of fatigue. Patient had several weeks of fatigue and then wife started noticing that he appeared to be somewhat jaundiced, so patient went to see his PCP on 7/13. Lab work done noted a critical anemia of 6.4 (patient previously healthy with normal labs 5 months prior) and so he was sent over to the emergency room for further evaluation. In the emergency room, patient found to have signs of biliary obstruction with a bilirubin of 14.5, elevated alkaline phosphatase, mild coagulopathy and confirmed low hemoglobin. Hospitalists were called for further evaluation and admission.  Patient transfused 2 units packed red blood cells. Gastroenterology consulted. CT scan of abdomen and pelvis noted multiple liver lesions and some ductal dilatation but no signs of frank biliary obstruction. Also noted was some inflammation around sigmoid area and given liver lesions,? Colon cancer. Multiple lesions also seen in both lungs. Anemia workup noted signs consistent with a hemolyzingprocess.  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, unknown primary site - abnormal Ct - s/p ERCP with stenting, brushings still pending  - appreciate GI team assistance - pt elects to have liver biopsy, will follow up on GI team recommendations     Anemia of chronic disease of malignancy - drop in Hg from 8 --> 7.4 - repeat CBC again in AM and transfuse if Hg < 7   Hypokalemia - Magnesium level was 1.4 but never repeated - check Mg level and supplement if needed  - K has been supplemented and  WNL this AM   Diabetes mellitus, type II - reasonable inpatient control - continue Levemir 10 U QHS   Hypertension, accelerated - SBP in 150's - add Hydralazine as needed    Liver failure, acute - Secondary to liver metastases. Cont to monitor INR. Avoid Tylenol.    Obesity  - Body mass index is 32.08 kg/(m^2).  DVT prophylaxis - SCD's  Code Status: Full.  Family Communication:  plan of care discussed with the patient Disposition Plan: Home when stable.   IV access:  Peripheral IV  Procedures and diagnostic studies:    Ct Head W Wo Contrast 07/14/2015 Normal CT appearance of the brain for age. No acute item al day or evidence for metastatic disease  Ct Chest W Contrast 07/14/2015   Numerous metastatic lesions in lungs. There is no convincing lung primary malignancy. Metastatic adenopathy  Ct Abdomen Pelvis W Contrast 07/13/2015  hepatic and pulmonary metastatic disease. There is an associated right-sided pleural effusion and ascites identified. Right adrenal gland lesion.   Dg Ercp 07/14/2015  ERCP and stent placement.  These images were submitted for radiologic interpretation only. Please see the procedural report for the amount of contrast and the fluoroscopy time utilized.    Medical Consultants:  GI  Other Consultants:  PT Nutritionist   IAnti-Infectives:   None  Faye Ramsay, MD  [place] Pager [phone]  If 7PM-7AM, please contact night-coverage [email or web address] Password [email or web address] 07/16/2015, 6:06 PM   LOS: 3 days   HPI/Subjective: No events overnight.   Objective: Filed Vitals:   07/15/15 2049 07/16/15 0539 07/16/15 0957 07/16/15 1739  BP:  142/78 142/98 125/62 150/78  Pulse: 100 96 95 94  Temp: 98.5 F (36.9 C) 98.4 F (36.9 C) 98 F (36.7 C) 98.2 F (36.8 C)  TempSrc: Oral Oral Oral Oral  Resp: 18 19 18 18   Height:      Weight: 119.5 kg (263 lb 7.2 oz)     SpO2: 98% 97% 96% 96%    Intake/Output Summary (Last 24 hours) at 07/16/15  1806 Last data filed at 07/16/15 1700  Gross per 24 hour  Intake   1515 ml  Output    252 ml  Net   1263 ml    Exam:   General:  Pt is alert, follows commands appropriately, not in acute distress, jaundiced skin  Cardiovascular: Regular rate and rhythm, S1/S2, no murmurs, no rubs, no gallops  Respiratory: Clear to auscultation bilaterally, no wheezing  Abdomen: Soft, non tender, non distended, bowel sounds present, no guarding  Extremities: No edema, pulses DP and PT palpable bilaterally  Data Reviewed: Basic Metabolic Panel:  Recent Labs Lab 07/13/15 1330 07/13/15 1456 07/14/15 0522 07/14/15 1206 07/14/15 1429 07/15/15 0408 07/16/15 0358  NA 139  --  136 135 139 136 137  K 2.3*  --  2.6* 2.6* 3.5 3.0* 3.9  CL 104  --  102 102  --  102 105  CO2 24  --  23 22  --  23 25  GLUCOSE 70  --  108* 99 67 108* 148*  BUN 16  --  14 14  --  12 11  CREATININE 0.98  --  0.71 0.77  --  0.70 0.74  CALCIUM 8.6*  --  8.1* 8.3*  --  8.2* 8.1*  MG  --  1.4*  --   --   --   --   --    Liver Function Tests:  Recent Labs Lab 07/13/15 1330 07/14/15 0522 07/15/15 0408 07/16/15 0358  AST 164* 128* 112* 91*  ALT 73* 63 58 55  ALKPHOS 985* 870* 832* 769*  BILITOT 14.5* 14.0* 13.9* 13.0*  PROT 6.9 6.0* 6.0* 5.9*  ALBUMIN 2.0* 1.8* 1.7* 1.7*    Recent Labs Lab 07/13/15 1410  AMMONIA 54*   CBC:  Recent Labs Lab 07/13/15 1330 07/13/15 2025 07/14/15 0522 07/14/15 1429 07/15/15 0408 07/15/15 1105 07/15/15 1710 07/16/15 0358  WBC 14.7* 14.1* 10.5  --  9.6  --   --  10.6*  HGB 6.9* 6.4* 6.5* 9.2* 6.7* 6.9* 8.0* 7.4*  HCT 24.7* 23.4* 22.7* 27.0* 23.0* 24.8* 27.9* 25.0*  MCV 76.5* 77.7* 76.4*  --  77.2*  --   --  78.6  PLT 466* 511* 408*  --  361  --   --  369   CBG:  Recent Labs Lab 07/15/15 1641 07/15/15 2047 07/16/15 0743 07/16/15 1200 07/16/15 1659  GLUCAP 172* 173* 148* 164* 140*   Scheduled Meds: . amLODipine  5 mg Oral Daily  . insulin aspart  0-9  Units Subcutaneous TID WC  . magnesium oxide  400 mg Oral BID  . potassium chloride  40 mEq Oral TID  . sodium chloride  3 mL Intravenous Q12H   Continuous Infusions:

## 2015-07-16 NOTE — Progress Notes (Signed)
Cross cover [facility] Subjective: Patient has decided to have a liver biopsy to get a tissue diagnosis for his ?metastatic disease. He denies having any new GI issues. His wife is in the room with him. He had a stent placed for a CBD stricture. The results from the ERCP brushings are still pending.    Objective: Vital signs in last 24 hours: Temp:  [97.7 F (36.5 C)-98.5 F (36.9 C)] 98 F (36.7 C) (07/16 0957) Pulse Rate:  [87-100] 95 (07/16 0957) Resp:  [18-19] 18 (07/16 0957) BP: (125-150)/(62-98) 125/62 mmHg (07/16 0957) SpO2:  [96 %-99 %] 96 % (07/16 0957) Weight:  [119.5 kg (263 lb 7.2 oz)] 119.5 kg (263 lb 7.2 oz) (07/15 2049) Last BM Date: 07/15/15  Intake/Output from previous day: 07/15 0701 - 07/16 0700 In: 1805 [P.O.:960; I.V.:480; Blood:365] Out: 1101 [Urine:1100; Stool:1] Intake/Output this shift: Total I/O In: 423 [P.O.:420; I.V.:3] Out: -   General appearance: alert, cooperative, appears stated age, icteric and no distress Resp: clear to auscultation bilaterally Cardio: regular rate and rhythm, S1, S2 normal, no murmur, click, rub or gallop GI: soft, non-tender; bowel sounds normal; no masses,  no organomegaly  Lab Results:  Recent Labs  07/14/15 0522  07/15/15 0408 07/15/15 1105 07/15/15 1710 07/16/15 0358  WBC 10.5  --  9.6  --   --  10.6*  HGB 6.5*  < > 6.7* 6.9* 8.0* 7.4*  HCT 22.7*  < > 23.0* 24.8* 27.9* 25.0*  PLT 408*  --  361  --   --  369  < > = values in this interval not displayed. BMET  Recent Labs  07/14/15 1206 07/14/15 1429 07/15/15 0408 07/16/15 0358  NA 135 139 136 137  K 2.6* 3.5 3.0* 3.9  CL 102  --  102 105  CO2 22  --  23 25  GLUCOSE 99 67 108* 148*  BUN 14  --  12 11  CREATININE 0.77  --  0.70 0.74  CALCIUM 8.3*  --  8.2* 8.1*   LFT  Recent Labs  07/16/15 0358  PROT 5.9*  ALBUMIN 1.7*  AST 91*  ALT 55  ALKPHOS 769*  BILITOT 13.0*   PT/INR  Recent Labs  07/13/15 1456 07/14/15 0522  LABPROT 20.9* 20.0*   INR 1.80* 1.71*   Hepatitis Panel  Recent Labs  07/13/15 1456  HEPBSAG Negative  HCVAB <0.1  HEPAIGM Negative  HEPBIGM Negative   Studies/Results: Ct Head W Wo Contrast  07/14/2015   CLINICAL DATA:  Liver and lung neoplasm.  EXAM: CT HEAD WITHOUT AND WITH CONTRAST  TECHNIQUE: Contiguous axial images were obtained from the base of the skull through the vertex without and with intravenous contrast  CONTRAST:  58mL OMNIPAQUE IOHEXOL 300 MG/ML  SOLN  COMPARISON:  CT of the chest, abdomen, and pelvis.  FINDINGS: Minimal atrophy is within normal limits for age. No acute infarct, hemorrhage, or mass lesion is present. The ventricles are of normal size. No significant extraaxial fluid collection is present.  The paranasal sinuses and left mastoid air cells are clear. There is fluid in the right middle ear cavity and mastoid air cells without osseous destruction. The left lens is replaced. The globes and orbits are otherwise intact. The subcutaneous nodule in the posterior right neck measures 9 x 5 mm, likely inflammatory nodule.  The postcontrast images demonstrate no pathologic enhancement.  IMPRESSION: 1. Normal CT appearance of the brain for age. No acute item al day or evidence for metastatic disease.  2. Right middle ear and mastoid effusion. No obstructing nasopharyngeal lesion is evident.   Electronically Signed   By: San Morelle M.D.   On: 07/14/2015 19:59   Ct Chest W Contrast  07/14/2015   CLINICAL DATA:  59 yr old male with c/o fatigue x 3 weeks, jaundice x 1 week. Last night CT abd and pelvis showed liver lesions and lung lesions. This is for cancer staging. Hx: DM2, HTN, anemia, elevated LFT;s  EXAM: CT CHEST WITH CONTRAST  TECHNIQUE: Multidetector CT imaging of the chest was performed during intravenous contrast administration.  CONTRAST:  61mL OMNIPAQUE IOHEXOL 300 MG/ML  SOLN  COMPARISON:  Abdomen pelvis CT, 07/13/2015. Chest radiograph, 07/01/2013.  FINDINGS: Thoracic inlet: Vague  low-density area in the left lobe of the thyroid may reflect a sub cm nodule. Thyroid otherwise unremarkable. No neck base masses or adenopathy.  Mediastinum and hila: Heart normal in size. Mild coronary artery calcifications. Great vessels normal in caliber. Thoracic aorta unremarkable. Mildly enlarged right subcarinal lymph node measuring 11 mm in short axis. There are additional prominent and enlarged lower posterior mediastinal lymph nodes, largest lying to the right of the lower esophagus measuring 14 mm in short axis. No hilar masses or enlarged lymph nodes.  Lungs and pleura: Moderate right pleural effusion. Multiple bilateral pulmonary nodules. The 2 largest on the left are a left lower lobe nodule measuring 13 mm in diameter, image 35, series 3 and a left upper lobe nodule measuring 12 mm, image 23. On the right, the largest nodule is in the posterior right lower lobe measuring 19 mm. There is mild subsegmental atelectasis in the lung bases, greatest in the right lower lobe adjacent to the pleural effusion. No lung consolidation or edema.  Limited upper abdomen: Numerous liver masses consistent with widespread metastatic disease. This enlarges the liver. Small amount of ascites. Right adrenal mass. Multiple prominent upper abdominal lymph nodes. Findings stable from the recent abdomen pelvis CT.  Musculoskeletal:  No osteoblastic or osteolytic lesions.  IMPRESSION: 1. Numerous metastatic lesions in the lungs. There is no convincing lung primary malignancy. Metastatic adenopathy is seen in the lower posterior mediastinum. 2. Moderate right pleural effusion. 3. No acute findings in the lungs. 4. Extensive liver metastatic disease as described on the recent prior study. Right adrenal mass is also likely metastatic disease. Metastatic upper abdominal adenopathy.   Electronically Signed   By: Lajean Manes M.D.   On: 07/14/2015 20:00   Dg Ercp  07/14/2015   CLINICAL DATA:  Bile duct obstruction  EXAM: ERCP   TECHNIQUE: Multiple spot images obtained with the fluoroscopic device and submitted for interpretation post-procedure.  FLUOROSCOPY TIME:  Radiation Exposure Index (as provided by the fluoroscopic device):  If the device does not provide the exposure index:  Fluoroscopy Time:  2 minutes and 49 seconds  Number of Acquired Images:  13  COMPARISON:  None.  FINDINGS: Multiple images demonstrate cannulation of the common bile duct and contrast filling the biliary tree. A biliary stent has been placed.  IMPRESSION: ERCP and stent placement.  These images were submitted for radiologic interpretation only. Please see the procedural report for the amount of contrast and the fluoroscopy time utilized.   Electronically Signed   By: Marybelle Killings M.D.   On: 07/14/2015 16:46   Medications: I have reviewed the patient's current medications.  Assessment/Plan: 1) Metastatic disease of unknown primary with malignant biliary obstruction-s/p ERCP with stent placement. TB 13. Abnormal CT with multiple liver mets-VWants  to have a liver biopsy for tissue diagnosis. Will discuss with radiology. 2) Anemia without any obvious source of bleeding-hemoglobin 7.4 gm/dl today.   LOS: 3 days   Tanasha Menees 07/16/2015, 11:51 AM

## 2015-07-16 NOTE — Progress Notes (Signed)
07/16/2015 5:33 PM  Patient complaining of heart burn. Informed MD. New orders given. Medication given. Check eMAR. Will continue to assess and monitor the patient.   [employer], RN-BC, [employer] [facility] Phone [phone]

## 2015-07-17 LAB — CBC
HEMATOCRIT: 26 % — AB (ref 39.0–52.0)
Hemoglobin: 7.5 g/dL — ABNORMAL LOW (ref 13.0–17.0)
MCH: 22.9 pg — ABNORMAL LOW (ref 26.0–34.0)
MCHC: 28.8 g/dL — ABNORMAL LOW (ref 30.0–36.0)
MCV: 79.3 fL (ref 78.0–100.0)
Platelets: 348 10*3/uL (ref 150–400)
RBC: 3.28 MIL/uL — ABNORMAL LOW (ref 4.22–5.81)
RDW: 21.2 % — ABNORMAL HIGH (ref 11.5–15.5)
WBC: 10.8 10*3/uL — ABNORMAL HIGH (ref 4.0–10.5)

## 2015-07-17 LAB — MAGNESIUM: Magnesium: 1.4 mg/dL — ABNORMAL LOW (ref 1.7–2.4)

## 2015-07-17 LAB — TYPE AND SCREEN
ABO/RH(D): A POS
Antibody Screen: NEGATIVE

## 2015-07-17 LAB — GLUCOSE, CAPILLARY
GLUCOSE-CAPILLARY: 152 mg/dL — AB (ref 65–99)
GLUCOSE-CAPILLARY: 149 mg/dL — AB (ref 65–99)
Glucose-Capillary: 126 mg/dL — ABNORMAL HIGH (ref 65–99)
Glucose-Capillary: 148 mg/dL — ABNORMAL HIGH (ref 65–99)

## 2015-07-17 LAB — BASIC METABOLIC PANEL
ANION GAP: 7 (ref 5–15)
BUN: 8 mg/dL (ref 6–20)
CO2: 22 mmol/L (ref 22–32)
Calcium: 8.2 mg/dL — ABNORMAL LOW (ref 8.9–10.3)
Chloride: 105 mmol/L (ref 101–111)
Creatinine, Ser: 0.6 mg/dL — ABNORMAL LOW (ref 0.61–1.24)
GFR calc Af Amer: >60 mL/min (ref >60)
GFR calc non Af Amer: >60 mL/min (ref >60)
Glucose, Bld: 129 mg/dL — ABNORMAL HIGH (ref 65–99)
Potassium: 4.8 mmol/L (ref 3.5–5.1)
SODIUM: 134 mmol/L — AB (ref 135–145)

## 2015-07-17 MED ORDER — MAGNESIUM SULFATE 2 GM/50ML IV SOLN
2.0000 g | Freq: Once | INTRAVENOUS | Status: AC
  Administered 2015-07-17: 2 g via INTRAVENOUS
  Filled 2015-07-17: qty 50

## 2015-07-17 MED ORDER — HYDRALAZINE HCL 20 MG/ML IJ SOLN: 5.0000 mg | INTRAMUSCULAR | Status: DC | PRN

## 2015-07-17 MED ORDER — AMLODIPINE BESYLATE 10 MG PO TABS
10.0000 mg | ORAL_TABLET | Freq: Every day | ORAL | Status: DC
  Administered 2015-07-18 – 2015-07-19 (×2): 10 mg via ORAL
  Filled 2015-07-17 (×2): qty 1

## 2015-07-17 NOTE — Progress Notes (Signed)
PT Cancellation and Discharge Note  Patient Details Name: Jaime [name] MRN: [id number] DOB: 06/15/1956   Cancelled Treatment:    Reason Eval/Treat Not Completed: PT screened, no needs identified, will sign off   Pt is familiar with PT and reports he is not having trouble with mobility; He and his wife state the 3in1 is useful in the hospital bathroom (they have higher toilet at home);  Will sign off; [name] states if he identifies any PT needs he will request a PT order;  Thank you,  Roney Marion, [place] Pager [phone] Office [phone]    Roney Marion [facility] 07/17/2015, 3:11 PM

## 2015-07-17 NOTE — Progress Notes (Signed)
Patient ID: Jaime [name], male   DOB: 26-Aug-1956, 59 y.o.   MRN: [id number]  [facility] PROGRESS NOTE  Jaime [name] KNL:[id number] DOB: Oct 24, 1956 DOA: 07/13/2015 PCP: Elsie Stain, MD   Brief narrative:    59 year old male with past history of hypertension and diabetes mellitus type 2 diagnosed 2 years ago, underwent workup by PCP for several weeks of fatigue. Patient had several weeks of fatigue and then wife started noticing that he appeared to be somewhat jaundiced, so patient went to see his PCP on 7/13. Lab work done noted a critical anemia of 6.4 (patient previously healthy with normal labs 5 months prior) and so he was sent over to the emergency room for further evaluation. In the emergency room, patient found to have signs of biliary obstruction with a bilirubin of 14.5, elevated alkaline phosphatase, mild coagulopathy and confirmed low hemoglobin. Hospitalists were called for further evaluation and admission.  Patient transfused 2 units packed red blood cells. Gastroenterology consulted. CT scan of abdomen and pelvis noted multiple liver lesions and some ductal dilatation but no signs of frank biliary obstruction. Also noted was some inflammation around sigmoid area and given liver lesions,? Colon cancer. Multiple lesions also seen in both lungs. Anemia workup noted signs consistent with a hemolyzingprocess.  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, unknown primary site - abnormal CT - s/p ERCP with stenting, brushings still pending  - appreciate GI team assistance - pt elects to have liver biopsy, will follow up on GI team recommendations     Anemia of chronic disease of malignancy - drop in Hg from 8 --> 7.5 - repeat CBC again in AM and transfuse if Hg < 7   Hypokalemia - Magnesium level still low at 1.4 - supplement  - check Mg level in MA - K has been supplemented and WNL this AM    Diabetes mellitus, type II - reasonable inpatient control - continue Levemir 10 U QHS    Leukocytosis - unclear if reactive process, pt is afebrile - pt denies any specific urinary or cardiopulmonary concerns - will monitor with CBC    Hypertension, accelerated - SBP in 150's - added Hydralazine as needed    Liver failure, acute with hyperbilirubinemia  - Secondary to liver metastases. Avoid Tylenol.    Obesity  - Body mass index is 32.08 kg/(m^2).    Severe PCM - in the context of acute illness - nutritionist consulted   DVT prophylaxis - SCD's  Code Status: Full.  Family Communication:  plan of care discussed with the patient Disposition Plan: Home when stable.   IV access:  Peripheral IV  Procedures and diagnostic studies:    Ct Head W Wo Contrast 07/14/2015 Normal CT appearance of the brain for age. No acute item al day or evidence for metastatic disease  Ct Chest W Contrast 07/14/2015   Numerous metastatic lesions in lungs. There is no convincing lung primary malignancy. Metastatic adenopathy  Ct Abdomen Pelvis W Contrast 07/13/2015  hepatic and pulmonary metastatic disease. There is an associated right-sided pleural effusion and ascites identified. Right adrenal gland lesion.   Dg Ercp 07/14/2015  ERCP and stent placement.  These images were submitted for radiologic interpretation only. Please see the procedural report for the amount of contrast and the fluoroscopy time utilized.    Medical Consultants:  GI  Other Consultants:  PT Nutritionist   IAnti-Infectives:   None  Faye Ramsay, MD  [facility] Pager  [phone]  If 7PM-7AM, please contact night-coverage [email or web address] Password [facility] 07/17/2015, 11:38 AM   LOS: 4 days   HPI/Subjective: No events overnight.   Objective: Filed Vitals:   07/16/15 1739 07/16/15 2014 07/17/15 0459 07/17/15 0926  BP: 150/78 145/73 132/80 158/83  Pulse: 94 86 99 88  Temp: 98.2 F (36.8 C) 98.8 F (37.1 C) 98.7  F (37.1 C) 98.6 F (37 C)  TempSrc: Oral Oral Oral Oral  Resp: 18 17 16 18   Height:      Weight:  122.335 kg (269 lb 11.2 oz)    SpO2: 96% 95% 98% 99%    Intake/Output Summary (Last 24 hours) at 07/17/15 1138 Last data filed at 07/17/15 0900  Gross per 24 hour  Intake   1332 ml  Output    351 ml  Net    981 ml    Exam:   General:  Pt is alert, follows commands appropriately, not in acute distress, jaundiced skin  Cardiovascular: Regular rate and rhythm, S1/S2, no murmurs, no rubs, no gallops  Respiratory: Clear to auscultation bilaterally, no wheezing  Abdomen: Soft, non tender, non distended, bowel sounds present, no guarding  Extremities: No edema, pulses DP and PT palpable bilaterally  Data Reviewed: Basic Metabolic Panel:  Recent Labs Lab 07/13/15 1456 07/14/15 0522 07/14/15 1206 07/14/15 1429 07/15/15 0408 07/16/15 0358 07/17/15 0453  NA  --  136 135 139 136 137 134*  K  --  2.6* 2.6* 3.5 3.0* 3.9 4.8  CL  --  102 102  --  102 105 105  CO2  --  23 22  --  23 25 22   GLUCOSE  --  108* 99 67 108* 148* 129*  BUN  --  14 14  --  12 11 8   CREATININE  --  0.71 0.77  --  0.70 0.74 0.60*  CALCIUM  --  8.1* 8.3*  --  8.2* 8.1* 8.2*  MG 1.4*  --   --   --   --   --  1.4*   Liver Function Tests:  Recent Labs Lab 07/13/15 1330 07/14/15 0522 07/15/15 0408 07/16/15 0358  AST 164* 128* 112* 91*  ALT 73* 63 58 55  ALKPHOS 985* 870* 832* 769*  BILITOT 14.5* 14.0* 13.9* 13.0*  PROT 6.9 6.0* 6.0* 5.9*  ALBUMIN 2.0* 1.8* 1.7* 1.7*    Recent Labs Lab 07/13/15 1410  AMMONIA 54*   CBC:  Recent Labs Lab 07/13/15 2025 07/14/15 0522  07/15/15 0408 07/15/15 1105 07/15/15 1710 07/16/15 0358 07/17/15 0453  WBC 14.1* 10.5  --  9.6  --   --  10.6* 10.8*  HGB 6.4* 6.5*  < > 6.7* 6.9* 8.0* 7.4* 7.5*  HCT 23.4* 22.7*  < > 23.0* 24.8* 27.9* 25.0* 26.0*  MCV 77.7* 76.4*  --  77.2*  --   --  78.6 79.3  PLT 511* 408*  --  361  --   --  369 348  < > = values in  this interval not displayed. CBG:  Recent Labs Lab 07/16/15 0743 07/16/15 1200 07/16/15 1659 07/16/15 2013 07/17/15 0724  GLUCAP 148* 164* 140* 125* 126*   Scheduled Meds: . amLODipine  5 mg Oral Daily  . insulin aspart  0-9 Units Subcutaneous TID WC  . magnesium oxide  400 mg Oral BID  . potassium chloride  40 mEq Oral TID  . sodium chloride  3 mL Intravenous Q12H   Continuous Infusions:

## 2015-07-17 NOTE — Progress Notes (Addendum)
Cross cover [facility] Subjective: Patient was sleeping when I went to see him. I decided not to wake him up. Awaiting liver biopsy for tissue diagnosis.   Objective: Vital signs in last 24 hours: Temp:  [98 F (36.7 C)-98.8 F (37.1 C)] 98.7 F (37.1 C) (07/17 0459) Pulse Rate:  [86-99] 99 (07/17 0459) Resp:  [16-18] 16 (07/17 0459) BP: (125-150)/(62-80) 132/80 mmHg (07/17 0459) SpO2:  [95 %-98 %] 98 % (07/17 0459) Weight:  [122.335 kg (269 lb 11.2 oz)] 122.335 kg (269 lb 11.2 oz) (07/16 2014) Last BM Date: 07/15/15  Intake/Output from previous day: 07/16 0701 - 07/17 0700 In: 4585 [P.O.:1512; I.V.:3] Out: 1 [Urine:1] Intake/Output this shift:   GPE: Not done today.  Lab Results:  Recent Labs  07/15/15 0408  07/15/15 1710 07/16/15 0358 07/17/15 0453  WBC 9.6  --   --  10.6* 10.8*  HGB 6.7*  < > 8.0* 7.4* 7.5*  HCT 23.0*  < > 27.9* 25.0* 26.0*  PLT 361  --   --  369 348  < > = values in this interval not displayed. BMET  Recent Labs  07/15/15 0408 07/16/15 0358 07/17/15 0453  NA 136 137 134*  K 3.0* 3.9 4.8  CL 102 105 105  CO2 23 25 22   GLUCOSE 108* 148* 129*  BUN 12 11 8   CREATININE 0.70 0.74 0.60*  CALCIUM 8.2* 8.1* 8.2*   LFT  Recent Labs  07/16/15 0358  PROT 5.9*  ALBUMIN 1.7*  AST 91*  ALT 55  ALKPHOS 769*  BILITOT 13.0*   Medications: I have reviewed the patient's current medications.  Assessment/Plan: Multiple mets in liver and lungs: Awaiting liver biopsy tomorrow. I have spoken to Dr. Posey Pronto in radiology who is going to help [place] set this up.  LOS: 4 days   Julianny Milstein 07/17/2015, 8:31 AM

## 2015-07-18 ENCOUNTER — Encounter (HOSPITAL_COMMUNITY): Payer: Self-pay | Admitting: Radiology

## 2015-07-18 DIAGNOSIS — C249 Malignant neoplasm of biliary tract, unspecified: Secondary | ICD-10-CM

## 2015-07-18 DIAGNOSIS — E440 Moderate protein-calorie malnutrition: Secondary | ICD-10-CM | POA: Insufficient documentation

## 2015-07-18 DIAGNOSIS — R160 Hepatomegaly, not elsewhere classified: Secondary | ICD-10-CM | POA: Insufficient documentation

## 2015-07-18 LAB — CBC
HCT: 26.7 % — ABNORMAL LOW (ref 39.0–52.0)
HEMOGLOBIN: 7.6 g/dL — AB (ref 13.0–17.0)
MCH: 22.8 pg — ABNORMAL LOW (ref 26.0–34.0)
MCHC: 28.5 g/dL — ABNORMAL LOW (ref 30.0–36.0)
MCV: 80.2 fL (ref 78.0–100.0)
PLATELETS: 376 10*3/uL (ref 150–400)
RBC: 3.33 MIL/uL — ABNORMAL LOW (ref 4.22–5.81)
RDW: 21.9 % — ABNORMAL HIGH (ref 11.5–15.5)
WBC: 10 10*3/uL (ref 4.0–10.5)

## 2015-07-18 LAB — GLUCOSE, CAPILLARY
Glucose-Capillary: 122 mg/dL — ABNORMAL HIGH (ref 65–99)
Glucose-Capillary: 139 mg/dL — ABNORMAL HIGH (ref 65–99)
Glucose-Capillary: 219 mg/dL — ABNORMAL HIGH (ref 65–99)
Glucose-Capillary: 136 mg/dL — ABNORMAL HIGH (ref 65–99)

## 2015-07-18 LAB — BASIC METABOLIC PANEL
ANION GAP: 8 (ref 5–15)
BUN: <5 mg/dL — ABNORMAL LOW (ref 6–20)
CALCIUM: 8.5 mg/dL — AB (ref 8.9–10.3)
CHLORIDE: 102 mmol/L (ref 101–111)
CO2: 25 mmol/L (ref 22–32)
Creatinine, Ser: 0.66 mg/dL (ref 0.61–1.24)
GFR calc Af Amer: >60 mL/min (ref >60)
GFR calc non Af Amer: >60 mL/min (ref >60)
GLUCOSE: 124 mg/dL — AB (ref 65–99)
POTASSIUM: 4.2 mmol/L (ref 3.5–5.1)
Sodium: 135 mmol/L (ref 135–145)

## 2015-07-18 LAB — PROTIME-INR
INR: 1.15 (ref 0.00–1.49)
Prothrombin Time: 14.9 seconds (ref 11.6–15.2)

## 2015-07-18 LAB — MAGNESIUM: Magnesium: 1.6 mg/dL — ABNORMAL LOW (ref 1.7–2.4)

## 2015-07-18 MED ORDER — MAGNESIUM OXIDE 400 (241.3 MG) MG PO TABS: 400.0000 mg | ORAL_TABLET | Freq: Two times a day (BID) | ORAL | Status: DC

## 2015-07-18 MED ORDER — MAGNESIUM SULFATE 2 GM/50ML IV SOLN
2.0000 g | Freq: Once | INTRAVENOUS | Status: AC
  Administered 2015-07-18: 2 g via INTRAVENOUS
  Filled 2015-07-18: qty 50

## 2015-07-18 MED ORDER — TRAMADOL HCL 50 MG PO TABS: 50.0000 mg | ORAL_TABLET | Freq: Four times a day (QID) | ORAL | Status: DC | PRN

## 2015-07-18 NOTE — Progress Notes (Signed)
Daily Rounding Note  07/18/2015, 12:47 PM  LOS: 5 days   SUBJECTIVE:       Appetite improved.  Urine less amber.  No pruritus.  No abd pain.  Feels ok  OBJECTIVE:         Vital signs in last 24 hours:    Temp:  [98.2 F (36.8 C)-98.8 F (37.1 C)] 98.2 F (36.8 C) (07/18 0810) Pulse Rate:  [20-93] 20 (07/18 0810) Resp:  [17-20] 20 (07/18 0810) BP: (127-139)/(63-71) 136/63 mmHg (07/18 0810) SpO2:  [97 %-98 %] 98 % (07/18 0810) Weight:  [269 lb 12.8 oz (122.38 kg)] 269 lb 12.8 oz (122.38 kg) (07/17 2042) Last BM Date: 07/17/15 Filed Weights   07/15/15 2049 07/16/15 2014 07/17/15 2042  Weight: 263 lb 7.2 oz (119.5 kg) 269 lb 11.2 oz (122.335 kg) 269 lb 12.8 oz (122.38 kg)   General: jaundiced, comfortable   Heart: RRR Chest: clear bil.  Abdomen: soft, NT, ND.  Mass palpable in LUQ when pt lays on his right side: NT.   Extremities: no CCE Neuro/Psych:  Pleasant, oriented x 3.  Calm.  No deficits grossly.   Intake/Output from previous day: 07/17 0701 - 07/18 0700 In: 840 [P.O.:840] Out: 350 [Urine:350]  Intake/Output this shift: Total I/O In: 0  Out: 250 [Urine:250]  Lab Results:  Recent Labs  07/16/15 0358 07/17/15 0453 07/18/15 0518  WBC 10.6* 10.8* 10.0  HGB 7.4* 7.5* 7.6*  HCT 25.0* 26.0* 26.7*  PLT 369 348 376   BMET  Recent Labs  07/16/15 0358 07/17/15 0453 07/18/15 0518  NA 137 134* 135  K 3.9 4.8 4.2  CL 105 105 102  CO2 25 22 25   GLUCOSE 148* 129* 124*  BUN 11 8 <5*  CREATININE 0.74 0.60* 0.66  CALCIUM 8.1* 8.2* 8.5*   LFT  Recent Labs  07/16/15 0358  PROT 5.9*  ALBUMIN 1.7*  AST 91*  ALT 55  ALKPHOS 769*  BILITOT 13.0*   PT/INR  Recent Labs  07/18/15 0518  LABPROT 14.9  INR 1.15   Hepatitis Panel No results for input(s): HEPBSAG, HCVAB, HEPAIGM, HEPBIGM in the last 72 hours.  Studies/Results: No results found.  ASSESMENT:   *   Obstructive jaundice,  malignant.  07/13/14 ERCP with strictured CBD, placement plastic stent, brushings still pending.  Elevated CEA and CA 19-9. Liver and lung mets.   Alk phos, AST and t bili improved but still significantly jaundiced.  Given extent of liver mets, he may not completely resolve his jaundice.  Verbal report called to Dr Fuller Plan re cytology: shows poorly differentiated carcinom, favoring adenocarcinoma.  Final stains still in process.   Clinically improving.   *  Acute on chronic anemia.  S/p 2 PRBC.   *  Coagulopathy.  No AC PTA.  Corrected with po Vit K     PLAN   *  Needs to see an oncologist.  Could be done as outpt but suggest internist/hospitalist confirm how oncology would like to proceed. If outpt consult: could discharge home later today or in AM.   Trend LFTs.   *  GI follow up prn.  May ultimately require metal stent if the plastic stent occludes.     Azucena Freed  07/18/2015, 12:47 PM Pager: [phone] Attending MD note:   I have taken a history, examined the patient, and reviewed the chart. I agree with the Advanced Practitioner's impression and recommendations.  Please see my separate  note  [facility] Pager # [phone]

## 2015-07-18 NOTE — Progress Notes (Signed)
Results of cytology brushings from ERCP by DR Fuller Plan from 07/14/2015, show malignant cells c/w adenocarcinoma. Would ask Onc consult if liver biopsy still necessary. 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  Question whether colonoscopy has benefit , depending if surgery  contemplates palliative resection.

## 2015-07-18 NOTE — Discharge Instructions (Signed)
Liver Biopsy The liver is a large organ in the upper right-hand side of your abdomen. A liver biopsy is a procedure in which a tissue sample is taken from the liver and examined under a microscope. The procedure is done to confirm a suspected problem. There are three types of liver biopsies:  Percutaneous. In this type, an incision is made in your abdomen. The sample is removed through the incision with a needle.  Laparoscopic. In this type, several incisions are made in the abdomen. A tiny camera is passed through one of the incisions to help guide the health care provider. The sample is removed through the other incision or incisions.  Transjugular. In this type, an incision is made in the neck. A tube is passed through the incision to the liver. The sample is removed through the tube with a needle. LET YOUR HEALTH CARE PROVIDER KNOW ABOUT:  Any allergies you have.  All medicines you are taking, including vitamins, herbs, eye drops, creams, and over-the-counter medicines.  Previous problems you or members of your family have had with the use of anesthetics.  Any blood disorders you have.  Previous surgeries you have had.  Medical conditions you have.  Possibility of pregnancy, if this applies. RISKS AND COMPLICATIONS Generally, this is a safe procedure. However, problems can occur and include:  Bleeding.  Infection.  Bruising.  Collapsed lung.  Leak of digestive juices (bile) from the liver or gallbladder.  Problems with heart rhythm.  Pain at the biopsy site or in the right shoulder.  Low blood pressure (hypotension).  Injury to nearby organs or tissues. BEFORE THE PROCEDURE  Your health care provider may do some blood or urine tests. These will help your health care provider learn how well your kidneys and liver are working and how well your blood clots.  Ask your health care provider if you will be able to go home the day of the procedure. Arrange for someone to  take you home and stay with you for at least 24 hours.  Do not eat or drink anything after midnight on the night before the procedure or as directed by your health care provider.  Ask your health care provider about:  Changing or stopping your regular medicines. This is especially important if you are taking diabetes medicines or blood thinners.  Taking medicines such as aspirin and ibuprofen. These medicines can thin your blood. Do not take these medicines before your procedure if your health care provider asks you not to. PROCEDURE Regardless of the type of biopsy that will be done, you will have an IV line placed. Through this line, you will receive fluids and medicine to relax you. If you will be having a laparoscopic biopsy, you may also receive medicine through this line to make you sleep during the procedure (general anesthetic). Percutaneous Liver Biopsy  You will positioned on your back, with your right hand over your head.  A health care provider will locate your liver by tapping and pressing on the right side of your abdomen or with the help of an ultrasound machine or CT scan.  An area at the bottom of your last right rib will be numbed.  An incision will be made in the numbed area.  The biopsy needle will be inserted into the incision.  Several samples of liver tissue will be taken with the biopsy needle. You will be asked to hold your breath as each sample is taken. Laparoscopic Liver Biopsy  You will be   positioned on your back.  Several small incisions will be made in your abdomen.  Your doctor will pass a tiny camera through one incision. The camera will allow the liver to be viewed on a TV monitor in the operating room.  Tools will be passed through the other incision or incisions. These tools will be used to remove samples of liver tissue. Transjugular Liver Biopsy  You will be positioned on your back on an X-ray table, with your head turned to your left.  An  area on your neck just over your jugular vein will be numbed.  An incision will be made in the numbed area.  A tiny tube will be inserted through the incision. It will be pushed through the jugular vein to a blood vessel in the liver called the hepatic vein.  Dye will be inserted through the tube, and X-rays will be taken. The dye will make the blood vessels in the liver light up on the X-rays.  The biopsy needle will be pushed through the tube until it reaches the liver.  Samples of liver tissue will be taken with the biopsy needle.  The needle and the tube will be removed. After the samples are obtained, the incision or incisions will be closed. AFTER THE PROCEDURE  You will be taken to a recovery area.  You may have to lie on your right side for 1-2 hours. This will prevent bleeding from the biopsy site.  Your progress will be watched. Your blood pressure, pulse, and the biopsy site will be checked often.  You may have some pain or feel sick. If this happens, tell your health care provider.  As you begin to feel better, you will be offered ice and beverages.  You may be allowed to go home when the medicines have worn off and you can walk, drink, eat, and use the bathroom. Document Released: 03/08/2004 Document Revised: 05/03/2014 Document Reviewed: 02/12/2014 ExitCare Patient Information 2015 [employer]. This information is not intended to replace advice given to you by your health care provider. Make sure you discuss any questions you have with your health care provider.   

## 2015-07-18 NOTE — Progress Notes (Signed)
Patient ID: Jaime [name], male   DOB: [birth year], 59 y.o.   MRN: [id number]  [facility] PROGRESS NOTE  Jaime [name] FBP:[id number] DOB: 26-Mar-1956 DOA: 07/13/2015 PCP: Elsie Stain, MD   Brief narrative:    59 year old male with past history of hypertension and diabetes mellitus type 2 diagnosed 2 years ago, underwent workup by PCP for several weeks of fatigue. Patient had several weeks of fatigue and then wife started noticing that he appeared to be somewhat jaundiced, so patient went to see his PCP on 7/13. Lab work done noted a critical anemia of 6.4 (patient previously healthy with normal labs 5 months prior) and so he was sent over to the emergency room for further evaluation. In the emergency room, patient found to have signs of biliary obstruction with a bilirubin of 14.5, elevated alkaline phosphatase, mild coagulopathy and confirmed low hemoglobin. Hospitalists were called for further evaluation and admission.  Patient transfused 2 units packed red blood cells. Gastroenterology consulted. CT scan of abdomen and pelvis noted multiple liver lesions and some ductal dilatation but no signs of frank biliary obstruction. Also noted was some inflammation around sigmoid area and given liver lesions,? Colon cancer. Multiple lesions also seen in both lungs. Anemia workup noted signs consistent with a hemolyzingprocess.  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, unknown primary site - abnormal CT - s/p ERCP with stenting, brushings still pending  - appreciate GI team assistance - pt elects to have liver biopsy, will follow up on GI team recommendations  - biopsy to be done today, possible d/c today     Anemia of chronic disease of malignancy - drop in Hg from 8 --> 7.5 - repeat CBC again in AM and transfuse if Hg < 7   Hypokalemia - Magnesium level still low at 1.4 - continue to supplement  - K has  been supplemented and WNL this AM   Diabetes mellitus, type II - reasonable inpatient control - continue Levemir 10 U QHS    Leukocytosis - unclear if reactive process, pt is afebrile - pt denies any specific urinary or cardiopulmonary concerns - WBC is WNL   Hypertension, accelerated - SBP in 150's - added Hydralazine as needed    Liver failure, acute with hyperbilirubinemia  - Secondary to liver metastases. Avoid Tylenol.    Obesity  - Body mass index is 32.08 kg/(m^2).    Severe PCM - in the context of acute illness - nutritionist consulted   DVT prophylaxis - SCD's  Code Status: Full.  Family Communication:  plan of care discussed with the patient Disposition Plan: Home when stable.   IV access:  Peripheral IV  Procedures and diagnostic studies:    Ct Head W Wo Contrast 07/14/2015 Normal CT appearance of the brain for age. No acute item al day or evidence for metastatic disease  Ct Chest W Contrast 07/14/2015   Numerous metastatic lesions in lungs. There is no convincing lung primary malignancy. Metastatic adenopathy  Ct Abdomen Pelvis W Contrast 07/13/2015  hepatic and pulmonary metastatic disease. There is an associated right-sided pleural effusion and ascites identified. Right adrenal gland lesion.   Dg Ercp 07/14/2015  ERCP and stent placement.  These images were submitted for radiologic interpretation only. Please see the procedural report for the amount of contrast and the fluoroscopy time utilized.    Medical Consultants:  GI  Other Consultants:  PT Nutritionist   IAnti-Infectives:   None  MAGICK-MYERS, ISKRA,  MD  [facility] Pager [phone]  If 7PM-7AM, please contact night-coverage [email or web address] Password [facility] 07/18/2015, 1:43 PM   LOS: 5 days   HPI/Subjective: No events overnight.   Objective: Filed Vitals:   07/17/15 1716 07/17/15 2042 07/18/15 0438 07/18/15 0810  BP: 130/66 139/63 127/71 136/63  Pulse: 78 91 93 20  Temp: 98.2 F (36.8  C) 98.7 F (37.1 C) 98.8 F (37.1 C) 98.2 F (36.8 C)  TempSrc: Oral Oral Oral Oral  Resp: 18 17 19 20   Height:      Weight:  122.38 kg (269 lb 12.8 oz)    SpO2: 98% 97% 98% 98%    Intake/Output Summary (Last 24 hours) at 07/18/15 1343 Last data filed at 07/18/15 0937  Gross per 24 hour  Intake    480 ml  Output    250 ml  Net    230 ml    Exam:   General:  Pt is alert, follows commands appropriately, not in acute distress, jaundiced skin  Cardiovascular: Regular rate and rhythm, S1/S2, no murmurs, no rubs, no gallops  Respiratory: Clear to auscultation bilaterally, no wheezing  Abdomen: Soft, non tender, non distended, bowel sounds present, no guarding  Extremities: No edema, pulses DP and PT palpable bilaterally  Data Reviewed: Basic Metabolic Panel:  Recent Labs Lab 07/13/15 1456  07/14/15 1206 07/14/15 1429 07/15/15 0408 07/16/15 0358 07/17/15 0453 07/18/15 0518  NA  --   < > 135 139 136 137 134* 135  K  --   < > 2.6* 3.5 3.0* 3.9 4.8 4.2  CL  --   < > 102  --  102 105 105 102  CO2  --   < > 22  --  23 25 22 25   GLUCOSE  --   < > 99 67 108* 148* 129* 124*  BUN  --   < > 14  --  12 11 8  <5*  CREATININE  --   < > 0.77  --  0.70 0.74 0.60* 0.66  CALCIUM  --   < > 8.3*  --  8.2* 8.1* 8.2* 8.5*  MG 1.4*  --   --   --   --   --  1.4* 1.6*  < > = values in this interval not displayed. Liver Function Tests:  Recent Labs Lab 07/13/15 1330 07/14/15 0522 07/15/15 0408 07/16/15 0358  AST 164* 128* 112* 91*  ALT 73* 63 58 55  ALKPHOS 985* 870* 832* 769*  BILITOT 14.5* 14.0* 13.9* 13.0*  PROT 6.9 6.0* 6.0* 5.9*  ALBUMIN 2.0* 1.8* 1.7* 1.7*    Recent Labs Lab 07/13/15 1410  AMMONIA 54*   CBC:  Recent Labs Lab 07/14/15 0522  07/15/15 0408 07/15/15 1105 07/15/15 1710 07/16/15 0358 07/17/15 0453 07/18/15 0518  WBC 10.5  --  9.6  --   --  10.6* 10.8* 10.0  HGB 6.5*  < > 6.7* 6.9* 8.0* 7.4* 7.5* 7.6*  HCT 22.7*  < > 23.0* 24.8* 27.9* 25.0*  26.0* 26.7*  MCV 76.4*  --  77.2*  --   --  78.6 79.3 80.2  PLT 408*  --  361  --   --  369 348 376  < > = values in this interval not displayed. CBG:  Recent Labs Lab 07/17/15 1145 07/17/15 1643 07/17/15 2041 07/18/15 0725 07/18/15 1110  GLUCAP 152* 149* 148* 122* 139*   Scheduled Meds: . amLODipine  10 mg Oral Daily  . insulin aspart  0-9 Units Subcutaneous  TID WC  . magnesium oxide  400 mg Oral BID  . magnesium sulfate 1 - 4 g bolus IVPB  2 g Intravenous Once  . sodium chloride  3 mL Intravenous Q12H   Continuous Infusions:

## 2015-07-18 NOTE — Progress Notes (Signed)
Initial Nutrition Assessment  DOCUMENTATION CODES:   Non-severe (moderate) malnutrition in context of chronic illness, Obesity unspecified   Pt meets criteria for MODERATE MALNUTRITION in the context of chronic illness as evidenced by severe fat mass loss and moderate muscle mass loss.  INTERVENTION:   Diet advancement as medically appropriate.   RD to order supplements once diet advanced.   NUTRITION DIAGNOSIS:   Increased nutrient needs related to chronic illness as evidenced by estimated needs.  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 Pt reports having a good appetite currently and PTA. Meal completion has been 100%. Pt reports he usually consumes 3 meals a day with no other difficulties. Weight has been stable. Pt is agreeable to snacks/supplements once diet advances to aid in extra protein needs. RD to order once diet advances.   Nutrition-Focused physical exam completed. Findings are severe fat depletion, moderate muscle depletion, and severe edema.   Labs and medications reviewed.   Diet Order:  Diet NPO time specified Except for: Sips with Meds  Skin:   (+3 LE edema)  Last BM:  7/17  Height:   Ht Readings from Last 1 Encounters:  07/13/15 6\' 4"  (1.93 m)    Weight:   Wt Readings from Last 1 Encounters:  07/17/15 269 lb 12.8 oz (122.38 kg)    Ideal Body Weight:  91.8 kg  Wt Readings from Last 10 Encounters:  07/17/15 269 lb 12.8 oz  (122.38 kg)  11/04/14 245 lb (111.131 kg)  01/08/14 237 lb 7 oz (107.7 kg)  07/02/13 259 lb 7.7 oz (117.7 kg)  07/01/13 263 lb 1.6 oz (119.341 kg)  06/22/13 281 lb 1.4 oz (127.5 kg)    BMI:  Body mass index is 32.85 kg/(m^2).  Estimated Nutritional Needs:   Kcal:  3151-7616  Protein:  130-145 grams  Fluid:  2.3 - 2.6 L/day  EDUCATION NEEDS:   No education needs identified at this time  Corrin Parker, MS, RD, LDN Pager # [phone] After hours/ weekend pager # [phone]

## 2015-07-18 NOTE — H&P (Signed)
Reason for Consult: Liver masses.  Chief Complaint: Chief Complaint  Patient presents with  . Abnormal Lab  . Jaundice    Referring Physician(s): GI- Dr. Collene Mares  History of Present Illness: Jaime [name] is a 59 y.o. male admitted with abnormal labs and c/o fatigue and weight loss. Imaging revealed metastatic disease of unknown primary. He was found to have biliary obstruction and had an ERCP 7/14 with CBD stent placed and brushings-results are pending. IR received request for image guided liver mass biopsy. He denies any abdominal pain, chest pain, shortness of breath or palpitations. He denies any active signs of bleeding or excessive bruising. The patient denies any history of sleep apnea or chronic oxygen use. He has previously tolerated sedation without complications.   Past Medical History  Diagnosis Date  . Diabetes mellitus without complication   . Hypertension   . 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 I&d extremity Left 06/19/2013    Procedure: IRRIGATION AND DEBRIDEMENT EXTREMITY;  Surgeon: Newt Minion, MD;  Location: [place];  Service: Orthopedics;  Laterality: Left;  . I&d extremity Left 07/02/2013    Procedure: IRRIGATION AND DEBRIDEMENT EXTREMITY with placement of antibiotic beads and application of wound vac;  Surgeon: Newt Minion, MD;  Location: [place];  Service: Orthopedics;  Laterality: Left;  Irrigation and Debridement Left Foot, Place Beads, Wound VAC  . Amputation Left 01/08/2014    Procedure: AMPUTATION RAY;  Surgeon: Newt Minion, MD;  Location: [place];  Service: Orthopedics;  Laterality: Left;  Left Foot 5th Ray Amputation    Allergies: Review of patient's allergies indicates no known allergies.  Medications: Prior to Admission medications   Medication Sig Start Date End Date Taking? Authorizing Provider  amLODipine (NORVASC) 5 MG tablet Take 1 tablet (5 mg total) by mouth  daily. 06/22/13  Yes Shanker Kristeen Mans, MD  aspirin EC 81 MG tablet Take 81 mg by mouth daily.   Yes Historical Provider, MD  atorvastatin (LIPITOR) 10 MG tablet Take 10 mg by mouth daily at 6 PM.   Yes Historical Provider, MD  ferrous sulfate 325 (65 FE) MG tablet Take 325 mg by mouth 2 (two) times daily with a meal.   Yes Historical Provider, MD  Insulin Detemir (LEVEMIR FLEXPEN) 100 UNIT/ML SOPN Inject 30 Units into the skin daily with breakfast.   Yes Historical Provider, MD  lisinopril (PRINIVIL,ZESTRIL) 40 MG tablet Take 1 tablet (40 mg total) by mouth daily. 06/22/13  Yes Shanker Kristeen Mans, MD  metFORMIN (GLUCOPHAGE) 1000 MG tablet Take 1,000 mg by mouth 2 (two) times daily. 08/14/13  Yes Historical Provider, MD  Insulin Pen Needle 32G X 8 MM MISC Use for insulin injections 06/22/13   Shanker Kristeen Mans, MD  metFORMIN (GLUCOPHAGE) 500 MG tablet Take 1 tablet (500 mg total) by mouth 2 (two) times daily with a meal. Patient not taking: Reported on 07/13/2015 06/22/13   Jonetta Osgood, MD  oxyCODONE-acetaminophen (ROXICET) 5-325 MG per tablet Take 1 tablet by mouth every 4 (four) hours as needed for severe pain. Patient not taking: Reported on 07/13/2015 01/09/14   Newt Minion, MD     Family History  Problem Relation Age of Onset  . Heart failure Mother   . Heart attack Mother   . Leukemia Father     History   Social History  . Marital Status: Married    Spouse Name:  N/A  . Number of Children: N/A  . Years of Education: N/A   Social History Main Topics  . Smoking status: Never Smoker   . Smokeless tobacco: Never Used  . Alcohol Use: Yes     Comment: social drinker  . Drug Use: No  . Sexual Activity: Yes   Other Topics Concern  . None   Social History Narrative   Lives with wife at home   Review of Systems: A 12 point ROS discussed and pertinent positives are indicated in the HPI above.  All other systems are negative.  Review of Systems  Vital Signs: BP 136/63 mmHg   Pulse 20  Temp(Src) 98.2 F (36.8 C) (Oral)  Resp 20  Ht 6\' 4"  (1.93 m)  Wt 269 lb 12.8 oz (122.38 kg)  BMI 32.85 kg/m2  SpO2 98%   Physical Exam  Constitutional: He is oriented to person, place, and time. No distress.  Jaundice  HENT:  Head: Normocephalic and atraumatic.  Neck: No tracheal deviation present.  Cardiovascular: Normal rate and regular rhythm.  Exam reveals no gallop and no friction rub.   No murmur heard. Pulmonary/Chest: Effort normal and breath sounds normal. No respiratory distress. He has no wheezes. He has no rales.  Abdominal: Soft. Bowel sounds are normal. He exhibits no distension. There is no tenderness.  Neurological: He is alert and oriented to person, place, and time.  Skin: Skin is warm and dry. He is not diaphoretic.    Mallampati Score:  MD Evaluation Airway: WNL Heart: WNL Abdomen: WNL Chest/ Lungs: WNL ASA  Classification: 3 Mallampati/Airway Score: Two  Imaging: Ct Head W Wo Contrast  07/14/2015   CLINICAL DATA:  Liver and lung neoplasm.  EXAM: CT HEAD WITHOUT AND WITH CONTRAST  TECHNIQUE: Contiguous axial images were obtained from the base of the skull through the vertex without and with intravenous contrast  CONTRAST:  40mL OMNIPAQUE IOHEXOL 300 MG/ML  SOLN  COMPARISON:  CT of the chest, abdomen, and pelvis.  FINDINGS: Minimal atrophy is within normal limits for age. No acute infarct, hemorrhage, or mass lesion is present. The ventricles are of normal size. No significant extraaxial fluid collection is present.  The paranasal sinuses and left mastoid air cells are clear. There is fluid in the right middle ear cavity and mastoid air cells without osseous destruction. The left lens is replaced. The globes and orbits are otherwise intact. The subcutaneous nodule in the posterior right neck measures 9 x 5 mm, likely inflammatory nodule.  The postcontrast images demonstrate no pathologic enhancement.  IMPRESSION: 1. Normal CT appearance of the brain  for age. No acute item al day or evidence for metastatic disease. 2. Right middle ear and mastoid effusion. No obstructing nasopharyngeal lesion is evident.   Electronically Signed   By: San Morelle M.D.   On: 07/14/2015 19:59   Ct Chest W Contrast  07/14/2015   CLINICAL DATA:  59 yr old male with c/o fatigue x 3 weeks, jaundice x 1 week. Last night CT abd and pelvis showed liver lesions and lung lesions. This is for cancer staging. Hx: DM2, HTN, anemia, elevated LFT;s  EXAM: CT CHEST WITH CONTRAST  TECHNIQUE: Multidetector CT imaging of the chest was performed during intravenous contrast administration.  CONTRAST:  44mL OMNIPAQUE IOHEXOL 300 MG/ML  SOLN  COMPARISON:  Abdomen pelvis CT, 07/13/2015. Chest radiograph, 07/01/2013.  FINDINGS: Thoracic inlet: Vague low-density area in the left lobe of the thyroid may reflect a sub cm nodule. Thyroid  otherwise unremarkable. No neck base masses or adenopathy.  Mediastinum and hila: Heart normal in size. Mild coronary artery calcifications. Great vessels normal in caliber. Thoracic aorta unremarkable. Mildly enlarged right subcarinal lymph node measuring 11 mm in short axis. There are additional prominent and enlarged lower posterior mediastinal lymph nodes, largest lying to the right of the lower esophagus measuring 14 mm in short axis. No hilar masses or enlarged lymph nodes.  Lungs and pleura: Moderate right pleural effusion. Multiple bilateral pulmonary nodules. The 2 largest on the left are a left lower lobe nodule measuring 13 mm in diameter, image 35, series 3 and a left upper lobe nodule measuring 12 mm, image 23. On the right, the largest nodule is in the posterior right lower lobe measuring 19 mm. There is mild subsegmental atelectasis in the lung bases, greatest in the right lower lobe adjacent to the pleural effusion. No lung consolidation or edema.  Limited upper abdomen: Numerous liver masses consistent with widespread metastatic disease. This  enlarges the liver. Small amount of ascites. Right adrenal mass. Multiple prominent upper abdominal lymph nodes. Findings stable from the recent abdomen pelvis CT.  Musculoskeletal:  No osteoblastic or osteolytic lesions.  IMPRESSION: 1. Numerous metastatic lesions in the lungs. There is no convincing lung primary malignancy. Metastatic adenopathy is seen in the lower posterior mediastinum. 2. Moderate right pleural effusion. 3. No acute findings in the lungs. 4. Extensive liver metastatic disease as described on the recent prior study. Right adrenal mass is also likely metastatic disease. Metastatic upper abdominal adenopathy.   Electronically Signed   By: Lajean Manes M.D.   On: 07/14/2015 20:00   Ct Abdomen Pelvis W Contrast  07/13/2015   CLINICAL DATA:  Jaundice fatigue for 3 weeks  EXAM: CT ABDOMEN AND PELVIS WITH CONTRAST  TECHNIQUE: Multidetector CT imaging of the abdomen and pelvis was performed using the standard protocol following bolus administration of intravenous contrast.  CONTRAST:  145mL OMNIPAQUE IOHEXOL 300 MG/ML  SOLN  COMPARISON:  None.  FINDINGS: Lung bases demonstrate a moderate right-sided pleural effusion. Additionally multiple pulmonary nodules are identified. The largest of these in the left lower lobe measures 13 mm. The largest in the right lower lobe measures 19 mm.  The liver demonstrates diffuse involvement with multiple peripherally enhancing lesions. The largest of these near the dome of the liver measures approximately 13.6 x 10.6 cm in greatest transverse and AP dimensions respectively. Some biliary ductal dilatation is noted. Porta hepatis lymphadenopathy is noted as well as portacaval lymphadenopathy. Spleen is within normal limits. The pancreas is somewhat atrophic. The adrenal glands are within normal limits with the exception of a 2.7 cm lesion within the right adrenal gland best seen on image number 30 of series 201. Although this may represent an adenoma the possibility  of metastatic disease deserves consideration given the changes in the lungs and liver. The kidneys are well visualized bilaterally. A normal excretion pattern is seen. Small nonobstructing right renal stone is noted. No focal mass lesion is noted. Mild ascites is noted within the abdomen and pelvis. The bladder is partially distended. No pelvic mass lesion is seen. Diverticular change of the colon is noted. In the midportion of the sigmoid colon there is some wall thickening identified. It would be difficult to exclude an underlying lesion on the basis of this exam. This areas best seen on images 65-67 of series 201 and on the sagittal projections and image number 113 of series 204.  The bony structures demonstrate  degenerative change of the lumbar spine. No definitive lytic or blastic lesions are seen.  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 There is an associated right-sided pleural effusion and ascites identified.  Thickening of the sigmoid colon which may represent a focal lesion. Direct visualization may be helpful.  Right adrenal gland lesion. This may represent an adenoma although metastatic disease is not excluded.  Right renal calculus.   Electronically Signed   By: Inez Catalina M.D.   On: 07/13/2015 20:34   Dg Ercp  07/14/2015   CLINICAL DATA:  Bile duct obstruction  EXAM: ERCP  TECHNIQUE: Multiple spot images obtained with the fluoroscopic device and submitted for interpretation post-procedure.  FLUOROSCOPY TIME:  Radiation Exposure Index (as provided by the fluoroscopic device):  If the device does not provide the exposure index:  Fluoroscopy Time:  2 minutes and 49 seconds  Number of Acquired Images:  13  COMPARISON:  None.  FINDINGS: Multiple images demonstrate cannulation of the common bile duct and contrast filling the biliary tree. A biliary stent has been placed.  IMPRESSION: ERCP and stent placement.  These images were submitted for radiologic  interpretation only. Please see the procedural report for the amount of contrast and the fluoroscopy time utilized.   Electronically Signed   By: Marybelle Killings M.D.   On: 07/14/2015 16:46    Labs:  CBC:  Recent Labs  07/15/15 0408  07/15/15 1710 07/16/15 0358 07/17/15 0453 07/18/15 0518  WBC 9.6  --   --  10.6* 10.8* 10.0  HGB 6.7*  < > 8.0* 7.4* 7.5* 7.6*  HCT 23.0*  < > 27.9* 25.0* 26.0* 26.7*  PLT 361  --   --  369 348 376  < > = values in this interval not displayed.  COAGS:  Recent Labs  07/13/15 1456 07/14/15 0522 07/18/15 0518  INR 1.80* 1.71* 1.15    BMP:  Recent Labs  07/15/15 0408 07/16/15 0358 07/17/15 0453 07/18/15 0518  NA 136 137 134* 135  K 3.0* 3.9 4.8 4.2  CL 102 105 105 102  CO2 23 25 22 25   GLUCOSE 108* 148* 129* 124*  BUN 12 11 8  <5*  CALCIUM 8.2* 8.1* 8.2* 8.5*  CREATININE 0.70 0.74 0.60* 0.66  GFRNONAA >60 >60 >60 >60  GFRAA >60 >60 >60 >60    LIVER FUNCTION TESTS:  Recent Labs  07/13/15 1330 07/14/15 0522 07/15/15 0408 07/16/15 0358  BILITOT 14.5* 14.0* 13.9* 13.0*  AST 164* 128* 112* 91*  ALT 73* 63 58 55  ALKPHOS 985* 870* 832* 769*  PROT 6.9 6.0* 6.0* 5.9*  ALBUMIN 2.0* 1.8* 1.7* 1.7*    TUMOR MARKERS:  Recent Labs  07/14/15 0910  CEA 8250.0*  CA199 6313*    Assessment and Plan: Generalized fatigue Anemia Coagulopathy  Hepatic failure-unclear etiology Biliary obstruction with metastatic disease within the liver and lung, unknown primary s/p ERCP 7/14 with CBD stent and brushing-pending-spoke with Cytology today and will have official report later today-D/w Dr. Earleen Newport and will wait until results are back to decide whether or not the patient needs a liver biopsy. Request for image guided liver mass biopsy with sedation The patient has been NPO, no blood thinners taken, labs and vitals have been reviewed. Risks and Benefits discussed with the patient including, but not limited to bleeding, infection, damage to  adjacent structures or low yield requiring additional tests. All of the patient's questions were answered, patient is agreeable to proceed. Consent signed and in  chart. DM HTN Dyslipidemia    Thank you for this interesting consult.  I greatly enjoyed meeting Jaime [name] and look forward to participating in their care.  SignedHedy Jacob 07/18/2015, 10:24 AM   I spent a total of 40 Minutes in face to face in clinical consultation, greater than 50% of which was counseling/coordinating care for liver masses.

## 2015-07-19 ENCOUNTER — Other Ambulatory Visit: Payer: Self-pay

## 2015-07-19 DIAGNOSIS — E119 Type 2 diabetes mellitus without complications: Secondary | ICD-10-CM

## 2015-07-19 DIAGNOSIS — Z89429 Acquired absence of other toe(s), unspecified side: Secondary | ICD-10-CM

## 2015-07-19 DIAGNOSIS — R970 Elevated carcinoembryonic antigen [CEA]: Secondary | ICD-10-CM

## 2015-07-19 DIAGNOSIS — E611 Iron deficiency: Secondary | ICD-10-CM

## 2015-07-19 DIAGNOSIS — C801 Malignant (primary) neoplasm, unspecified: Secondary | ICD-10-CM

## 2015-07-19 LAB — CBC
HCT: 25.6 % — ABNORMAL LOW (ref 39.0–52.0)
Hemoglobin: 7.4 g/dL — ABNORMAL LOW (ref 13.0–17.0)
MCH: 23.3 pg — ABNORMAL LOW (ref 26.0–34.0)
MCHC: 28.9 g/dL — ABNORMAL LOW (ref 30.0–36.0)
MCV: 80.8 fL (ref 78.0–100.0)
Platelets: 278 10*3/uL (ref 150–400)
RBC: 3.17 MIL/uL — AB (ref 4.22–5.81)
RDW: 22.2 % — ABNORMAL HIGH (ref 11.5–15.5)
WBC: 10.3 10*3/uL (ref 4.0–10.5)

## 2015-07-19 LAB — GLUCOSE, CAPILLARY
GLUCOSE-CAPILLARY: 130 mg/dL — AB (ref 65–99)
Glucose-Capillary: 188 mg/dL — ABNORMAL HIGH (ref 65–99)

## 2015-07-19 LAB — COMPREHENSIVE METABOLIC PANEL
ALT: 40 U/L (ref 17–63)
ANION GAP: 10 (ref 5–15)
AST: 78 U/L — AB (ref 15–41)
Albumin: 1.8 g/dL — ABNORMAL LOW (ref 3.5–5.0)
Alkaline Phosphatase: 635 U/L — ABNORMAL HIGH (ref 38–126)
BUN: 7 mg/dL (ref 6–20)
CALCIUM: 8.3 mg/dL — AB (ref 8.9–10.3)
CHLORIDE: 99 mmol/L — AB (ref 101–111)
CO2: 23 mmol/L (ref 22–32)
Creatinine, Ser: 0.71 mg/dL (ref 0.61–1.24)
GFR calc Af Amer: >60 mL/min (ref >60)
GFR calc non Af Amer: >60 mL/min (ref >60)
Glucose, Bld: 131 mg/dL — ABNORMAL HIGH (ref 65–99)
Potassium: 4 mmol/L (ref 3.5–5.1)
Sodium: 132 mmol/L — ABNORMAL LOW (ref 135–145)
Total Bilirubin: 10.1 mg/dL — ABNORMAL HIGH (ref 0.3–1.2)
Total Protein: 6.4 g/dL — ABNORMAL LOW (ref 6.5–8.1)

## 2015-07-19 MED ORDER — INSULIN DETEMIR 100 UNIT/ML FLEXPEN: 5.0000 [IU] | PEN_INJECTOR | Freq: Every day | SUBCUTANEOUS | Status: DC

## 2015-07-19 MED ORDER — SODIUM CHLORIDE 0.9 % IV SOLN
510.0000 mg | Freq: Once | INTRAVENOUS | Status: DC
  Filled 2015-07-19: qty 17

## 2015-07-19 MED ORDER — FUROSEMIDE 20 MG PO TABS
20.0000 mg | ORAL_TABLET | Freq: Every day | ORAL | Status: DC
Start: 2015-07-19 — End: 2015-07-26

## 2015-07-19 MED ORDER — TRAMADOL HCL 50 MG PO TABS: 50.0000 mg | ORAL_TABLET | Freq: Four times a day (QID) | ORAL | Status: DC | PRN

## 2015-07-19 NOTE — Consult Note (Signed)
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 Abnormal Lab  . Jaundice  : I him in the hospital because I am yellow  HPI: mr. Milliman is a very nice 59 year old white gentleman. He's been pretty decent health. He does have diabetes. He has had a foot issue. He has had a toe amputation.  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  He has not lost much weight. His appetite has been a little bit down. He's had no abdominal or back pain.  He was admitted because he began to turn jaundiced. His wife notices. He did not have any pruritus.   He was admitted on July 13.when he was admitted, his bilirubin was 14.5. His alkaline phosphatase was 985.   He also came in iron deficient. His ferritin was 138 and iron saturation was only 5%.  He came in with a hemoglobin of 6.9. White cell count 14.7 and platelet count 466.  He had scans done.cT scan of the abdomen and pelvis showed hepatic and pulmonary metastatic disease. There is some thickening of the sigmoid colon.CT scan of the chest showed numerous metastatic lesions in the lung. There is some metastatic adenopathy in the mediastinum. In a moderate right pleural effusion.  Tumor markers were done. CEA was 8250 and CA-19-9  was 6313.  hhe had an ERCP done. This showed a severe stricture in the proximal common bile duct.He had a stent placed.  His last bilirubin back 3 days ago was 13. His alkaline phosphatase has come down a little bit to 769.  brushings that were done with the ERCP came back positive for adenocarcinoma ([id number]).   He has not had any colonoscopies.i do not think that he's had a  Colonoscopy  He does not smoke. He drinks only socially. There is no history of malignancy in the family.  He lives in [place].  Overall, his performance status is ECOG 1.   Past Medical History  Diagnosis Date  . Diabetes mellitus without  complication   . Hypertension   . 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 I&d extremity Left 06/19/2013    Procedure: IRRIGATION AND DEBRIDEMENT EXTREMITY;  Surgeon: Newt Minion, MD;  Location: [place];  Service: Orthopedics;  Laterality: Left;  . I&d extremity Left 07/02/2013    Procedure: IRRIGATION AND DEBRIDEMENT EXTREMITY with placement of antibiotic beads and application of wound vac;  Surgeon: Newt Minion, MD;  Location: [place];  Service: Orthopedics;  Laterality: Left;  Irrigation and Debridement Left Foot, Place Beads, Wound VAC  . Amputation Left 01/08/2014    Procedure: AMPUTATION RAY;  Surgeon: Newt Minion, MD;  Location: [place];  Service: Orthopedics;  Laterality: Left;  Left Foot 5th Ray Amputation  . Ercp N/A 07/14/2015    Procedure: ENDOSCOPIC RETROGRADE CHOLANGIOPANCREATOGRAPHY (ERCP);  Surgeon: Ladene Artist, MD;  Location: [facility] ENDOSCOPY;  Service: Endoscopy;  Laterality: N/A;  :   Current facility-administered medications:  .  amLODipine (NORVASC) tablet 10 mg, 10 mg, Oral, Daily, Theodis Blaze, MD, 10 mg at 07/18/15 1400 .  gi cocktail (Maalox,Lidocaine,Donnatal), 30 mL, Oral, TID PRN, Theodis Blaze, MD, 30 mL at 07/16/15 1738 .  hydrALAZINE (APRESOLINE) injection 5 mg, 5 mg, Intravenous, Q4H PRN, Theodis Blaze, MD .  insulin aspart (novoLOG) injection 0-9 Units, 0-9 Units, Subcutaneous, TID  WC, Rhetta Mura Schorr, NP, 3 Units at 07/18/15 1700 .  magnesium oxide (MAG-OX) tablet 400 mg, 400 mg, Oral, BID, Nita Sells, MD, 400 mg at 07/18/15 2205 .  sodium chloride 0.9 % injection 3 mL, 3 mL, Intravenous, Q12H, Nita Sells, MD, 3 mL at 07/18/15 2207:  . amLODipine  10 mg Oral Daily  . insulin aspart  0-9 Units Subcutaneous TID WC  . magnesium oxide  400 mg Oral BID  . sodium chloride  3 mL Intravenous Q12H  :  No Known Allergies:  Family History  Problem Relation Age of  Onset  . Heart failure Mother   . Heart attack Mother   . Leukemia Father   :  History   Social History  . Marital Status: Married    Spouse Name: N/A  . Number of Children: N/A  . Years of Education: N/A   Occupational History  . Not on file.   Social History Main Topics  . Smoking status: Never Smoker   . Smokeless tobacco: Never Used  . Alcohol Use: Yes     Comment: social drinker  . Drug Use: No  . Sexual Activity: Yes   Other Topics Concern  . Not on file   Social History Narrative   Lives with wife at home  :  Pertinent items are noted in HPI.  Exam: Patient Vitals for the past 24 hrs:  BP Temp Temp src Pulse Resp SpO2 Weight  07/19/15 0500 (!) 152/80 mmHg 98.6 F (37 C) Oral 100 18 94 % -  07/18/15 2035 (!) 117/52 mmHg 98.1 F (36.7 C) Oral 95 19 98 % 265 lb 14.4 oz (120.611 kg)  07/18/15 1727 128/61 mmHg 97.8 F (36.6 C) Oral - 20 99 % -  07/18/15 0810 136/63 mmHg 98.2 F (36.8 C) Oral 88 20 98 % -   Well-developed and well-nourished white gentleman. He is somewhat jaundiced. Head and neck exam shows some scleral icterus. He has no oral lesions. There is no adenopathy in the neck. Lungs are clear. Cardiac exam regular rate and rhythm with no murmurs, rubs or bruits. Abdomen is soft. He has good bowel sounds. There is no fluid wave. There is no able liver or spleen tip. He has no obvious guarding or rebound tenderness. Extremities shows some trace edema in his legs. He has good range of motion of his joints. Skin exam shows some jaundice. Neurological exam is nonfocal.   Recent Labs  07/17/15 0453 07/18/15 0518  WBC 10.8* 10.0  HGB 7.5* 7.6*  HCT 26.0* 26.7*  PLT 348 376    Recent Labs  07/17/15 0453 07/18/15 0518  NA 134* 135  K 4.8 4.2  CL 105 102  CO2 22 25  GLUCOSE 129* 124*  BUN 8 <5*  CREATININE 0.60* 0.66  CALCIUM 8.2* 8.5*    Blood smear review: none  Pathology:see above    Assessment and Plan: Mr. Heft is a 59 year old  gentleman with recently. His be metastatic malignancy. This is adenocarcinoma. I would have to suspect that this would be pancreatic. I don't see any obvious mass in the pancreas. His CA 19-9 is quite high. However, they CEA is also quite high .  Given the fact that he is iron deficient, I think he clearly needs to have a colonoscopy. I think this would really make sense. I suppose that he may have a colonic primary with metastasis. A right sided colon lesion would certainly be possible.  I also think that  additional biopsies are necessary. Now, with our molecular studies, we do need a lot of tissue. As such, I think a core biopsy of one of the liver lesions would help out.  He needs IV iron. I will order him a dose.  Since he [place]  In the [place] the [place], I will see if Dr. Julieanne Manson an follow him. I think this would be a lot easier for Mr. Dominic. He definitely will need systemic chemotherapy. I think it would be a  big inconvenience for him to come out to my office. It would be a lot easier for him to be treated and seen at the main [facility]. He is okay with this.  I spent about 45 minutes with him. I explained to him what I thought was going on.  Again, I really think that he needs a colonoscopy.  We will give him some IV iron.  It would be nice to recheck his bilirubin  To see if this is improving.  I appreciate the opportunity to of seeing Mr. Macaraeg.  Lum Keas  1 John 3:18

## 2015-07-19 NOTE — Discharge Summary (Signed)
Physician Discharge Summary  Jaime [name] IRJ:[id number] DOB: March 22, 1956 DOA: 07/13/2015  PCP: Elsie Stain, MD  Admit date: 07/13/2015 Discharge date: 07/19/2015  Recommendations for Outpatient Follow-up:  1. Pt will need to follow up with PCP in 2-3 weeks post discharge 2. Please obtain BMP to evaluate electrolytes and kidney function, K and Mg level 3. Pt given one dose of Mg 2 gm IV also prior to discharge and will continue taking PO supplementation  4. Please also check CBC to evaluate Hg and Hct levels 5. Pt made aware that colonoscopy will be scheduled for July 21st, 2016, appointment made by Dr. Olevia Perches 6. Pt also started on Lasix for LE edema, weight to be watched closely, wife made aware to report any weight gain of 3 + lbs 7. 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 Patient had several weeks of fatigue and then wife started noticing that he appeared to be somewhat jaundiced, so patient went to see his PCP on 7/13. Lab work done noted a critical anemia of 6.4 (patient previously healthy with normal labs 5 months prior) and so he was sent over to the emergency room for further evaluation. In the emergency room, patient found to have signs of biliary obstruction with a bilirubin of 14.5, elevated alkaline phosphatase, mild coagulopathy and confirmed low hemoglobin. Hospitalists were called for further evaluation and admission.  Patient transfused 2 units packed red blood cells. Gastroenterology consulted. CT scan of abdomen and pelvis noted multiple  liver lesions and some ductal dilatation but no signs of frank biliary obstruction. Also noted was some inflammation around sigmoid area and given liver lesions,? Colon cancer. Multiple lesions also seen in both lungs. Anemia workup noted signs consistent with a hemolyzingprocess.  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 - s/p ERCP with stenting, brushings notable for stage IV metastatic adenocarcinoma of sigmoid colon  - appreciate GI team and oncologist's assistance - colonoscopy still needed as well as liver biopsy per oncologist recommendations - this will be set up in an outpatient setting    Anemia of chronic disease of malignancy - drop in Hg from 8 --> 7.5, no signs of active bleeding  - given dose of IV iron prior to discharge    Hypokalemia - Magnesium level still low at 1.6 - continue to supplement  - K has been supplemented and WNL this AM   Diabetes mellitus, type II - reasonable inpatient control - continue Levemir 5 U QHS   Leukocytosis - unclear if reactive process, pt is afebrile - pt denies any specific urinary or cardiopulmonary concerns - WBC is WNL this AM   Hypertension, accelerated - reasonable inpatient control    Liver failure, acute with hyperbilirubinemia  - Secondary to liver metastases. Avoid Tylenol.   Obesity  - Body mass index is 32.08 kg/(m^2).   Severe PCM - in the context of acute illness - nutritionist consulted      LE edema  - in the setting of severe hypoalbuminemia  - provided lasix for  better volume control  - encouraged protein intake   DVT prophylaxis - SCD's  Code Status: Full.  Family Communication: plan of care discussed with the patient Disposition Plan: Home   IV access:  Peripheral IV  Procedures and diagnostic studies:   Ct Head W Wo Contrast 07/15/2015 Normal CT appearance of the brain for age. No acute item al  day or evidence for metastatic disease  Ct Chest W Contrast 07-15-2015 Numerous metastatic lesions in lungs. There is no convincing lung primary malignancy. Metastatic adenopathy  Ct Abdomen Pelvis W Contrast 07/13/2015 hepatic and pulmonary metastatic disease. There is an associated right-sided pleural effusion and ascites identified. Right adrenal gland lesion.   Dg Ercp 2015/07/15 ERCP and stent placement. These images were submitted for radiologic interpretation only. Please see the procedural report for the amount of contrast and the fluoroscopy time utilized.   Medical Consultants:  GI  Other Consultants:  PT Nutritionist   IAnti-Infectives:   None      Discharge Exam: Filed Vitals:   07/19/15 0805  BP: 131/81  Pulse: 96  Temp: 98.3 F (36.8 C)  Resp: 18   Filed Vitals:   07/18/15 1727 07/18/15 2035 07/19/15 0500 07/19/15 0805  BP: 128/61 117/52 152/80 131/81  Pulse:  95 100 96  Temp: 97.8 F (36.6 C) 98.1 F (36.7 C) 98.6 F (37 C) 98.3 F (36.8 C)  TempSrc: Oral Oral Oral Oral  Resp: 20 19 18 18   Height:      Weight:  120.611 kg (265 lb 14.4 oz)    SpO2: 99% 98% 94% 99%    General: Pt is alert, follows commands appropriately, not in acute distress Cardiovascular: Regular rate and rhythm, no rubs, no gallops Respiratory: Clear to auscultation bilaterally, no wheezing, no crackles, no rhonchi Abdominal: Soft, non tender, non distended, bowel sounds +, no guarding Extremities: LE edema +1 and bilateral feed swelling   Discharge Instructions  Discharge Instructions    Diet - low sodium heart healthy    Complete by:  As directed      Increase activity slowly    Complete by:  As directed             Medication List    STOP taking these medications        atorvastatin 10 MG tablet  Commonly known as:  LIPITOR     oxyCODONE-acetaminophen 5-325 MG per tablet  Commonly known as:  ROXICET      TAKE these medications         amLODipine 5 MG tablet  Commonly known as:  NORVASC  Take 1 tablet (5 mg total) by mouth daily.     aspirin EC 81 MG tablet  Take 81 mg by mouth daily.     ferrous sulfate 325 (65 FE) MG tablet  Take 325 mg by mouth 2 (two) times daily with a meal.     furosemide 20 MG tablet  Commonly known as:  LASIX  Take 1 tablet (20 mg total) by mouth daily.     Insulin Detemir 100 UNIT/ML Pen  Commonly known as:  LEVEMIR FLEXPEN  Inject 5 Units into the skin daily with breakfast.     Insulin Pen Needle 32G X 8 MM Misc  Use for insulin injections     lisinopril 40 MG tablet  Commonly known as:  PRINIVIL,ZESTRIL  Take 1 tablet (40 mg total) by mouth daily.     magnesium oxide 400 (241.3 MG) MG tablet  Commonly known  as:  MAG-OX  Take 1 tablet (400 mg total) by mouth 2 (two) times daily.     metFORMIN 1000 MG tablet  Commonly known as:  GLUCOPHAGE  Take 1,000 mg by mouth 2 (two) times daily.     traMADol 50 MG tablet  Commonly known as:  ULTRAM  Take 1 tablet (50 mg total) by mouth every 6 (six) hours as needed.             Follow-up Information    Follow up with Elsie Stain, MD.   Specialty:  [facility]   Contact information:   [address] [place] [address] [phone]       Call Faye Ramsay, MD.   Specialty:  Internal Medicine   Why:  As needed call my cell phone [phone]   Contact information:   [place] [address] [phone]       Follow up with Betsy Coder, MD.   Specialty:  Oncology   Why:  you will be called for the appointment time and date    Contact information:   [place] [address] [phone]        The results of significant diagnostics from this hospitalization (including imaging, microbiology, ancillary and laboratory) are listed below for reference.     Microbiology: No results found for this or any previous visit (from the past 240 hour(s)).    Labs: Basic Metabolic Panel:  Recent Labs Lab 07/13/15 1456  07/15/15 0408 07/16/15 0358 07/17/15 0453 07/18/15 0518 07/19/15 0539  NA  --   < > 136 137 134* 135 132*  K  --   < > 3.0* 3.9 4.8 4.2 4.0  CL  --   < > 102 105 105 102 99*  CO2  --   < > 23 25 22 25 23   GLUCOSE  --   < > 108* 148* 129* 124* 131*  BUN  --   < > 12 11 8  <5* 7  CREATININE  --   < > 0.70 0.74 0.60* 0.66 0.71  CALCIUM  --   < > 8.2* 8.1* 8.2* 8.5* 8.3*  MG 1.4*  --   --   --  1.4* 1.6*  --   < > = values in this interval not displayed. Liver Function Tests:  Recent Labs Lab 07/13/15 1330 07/14/15 0522 07/15/15 0408 07/16/15 0358 07/19/15 0539  AST 164* 128* 112* 91* 78*  ALT 73* 63 58 55 40  ALKPHOS 985* 870* 832* 769* 635*  BILITOT 14.5* 14.0* 13.9* 13.0* 10.1*  PROT 6.9 6.0* 6.0* 5.9* 6.4*  ALBUMIN 2.0* 1.8* 1.7* 1.7* 1.8*   No results for input(s): LIPASE, AMYLASE in the last 168 hours.  Recent Labs Lab 07/13/15 1410  AMMONIA 54*   CBC:  Recent Labs Lab 07/15/15 0408  07/15/15 1710 07/16/15 0358 07/17/15 0453 07/18/15 0518 07/19/15 0539  WBC 9.6  --   --  10.6* 10.8* 10.0 10.3  HGB 6.7*  < > 8.0* 7.4* 7.5* 7.6* 7.4*  HCT 23.0*  < > 27.9* 25.0* 26.0* 26.7* 25.6*  MCV 77.2*  --   --  78.6 79.3 80.2 80.8  PLT 361  --   --  369 348 376 278  < > = values in this interval not displayed.  CBG:  Recent Labs Lab 07/18/15 0725 07/18/15 1110 07/18/15 1602 07/18/15 2034 07/19/15 0801  GLUCAP 122* 139* 219* 136* 130*  SIGNED: Time coordinating discharge: 30 minutes  Faye Ramsay, MD  [facility] 07/19/2015, 10:34 AM Pager [phone]  If 7PM-7AM, please contact night-coverage [email or web address] Password [email or web address]

## 2015-07-19 NOTE — Progress Notes (Signed)
Liver biopsy canceled because  We already have a tissue diagnosis. I spoke with DR Earleen Newport at IR. Pt will be scheduled with Oncology as an outpatient. Question of colonoscopy , will wait for oncology opinion.Will sign off.

## 2015-07-19 NOTE — Care Management Note (Signed)
Case Management Note  Patient Details  Name: Jaime [name] MRN: [id number] Date of Birth: 06/28/1956  Subjective/Objective:          CM following for progression and d/c planning          Action/Plan: No d/c need identified at this time. Pt will followup with oncology.  Expected Discharge Date:       07/19/2015           Expected Discharge Plan:  Home/Self Care  In-House Referral:  NA  Discharge planning Services  NA  Post Acute Care Choice:    Choice offered to:     DME Arranged:    DME Agency:     HH Arranged:    HH Agency:     Status of Service:  Completed, signed off  Medicare Important Message Given:    Date Medicare IM Given:    Medicare IM give by:    Date Additional Medicare IM Given:    Additional Medicare Important Message give by:     If discussed at [place] of Stay Meetings, dates discussed:    Additional Comments:  Adron Bene, RN 07/19/2015, 12:30 PM

## 2015-07-20 ENCOUNTER — Ambulatory Visit (AMBULATORY_SURGERY_CENTER): Payer: Self-pay | Admitting: *Deleted

## 2015-07-20 ENCOUNTER — Telehealth: Payer: Self-pay | Admitting: Oncology

## 2015-07-20 ENCOUNTER — Encounter: Payer: Self-pay | Admitting: *Deleted

## 2015-07-20 VITALS — Ht 75.0 in | Wt 253.0 lb

## 2015-07-20 DIAGNOSIS — C801 Malignant (primary) neoplasm, unspecified: Principal | ICD-10-CM

## 2015-07-20 DIAGNOSIS — C787 Secondary malignant neoplasm of liver and intrahepatic bile duct: Secondary | ICD-10-CM

## 2015-07-20 MED ORDER — NA SULFATE-K SULFATE-MG SULF 17.5-3.13-1.6 GM/177ML PO SOLN: 1.0000 | Freq: Once | ORAL | Status: DC

## 2015-07-20 NOTE — Telephone Encounter (Signed)
new patient appt-s/w patient and gave np appt for 07/26 @ 1:30 w/Dr. Benay Spice Referring Dr. Mart Piggs Dx-Pancreatic ca

## 2015-07-20 NOTE — Progress Notes (Signed)
Dr. Benay Spice will see patient on 7/26/ at 2 pm. New patient scheduler notified. After review of records, Dr. Benay Spice confirms he does need a colonoscopy-referral to [facility] made. Will present case at 07/26/15 GI Conference.

## 2015-07-20 NOTE — Progress Notes (Signed)
No egg or soy allergy. No anesthesia problems.  No diet meds.  No home O2.

## 2015-07-21 ENCOUNTER — Ambulatory Visit (AMBULATORY_SURGERY_CENTER): Payer: Managed Care, Other (non HMO) | Admitting: Internal Medicine

## 2015-07-21 ENCOUNTER — Encounter: Payer: Self-pay | Admitting: Internal Medicine

## 2015-07-21 VITALS — BP 123/63 | HR 87 | Temp 97.6°F | Resp 22 | Ht 75.0 in | Wt 253.0 lb

## 2015-07-21 DIAGNOSIS — C801 Malignant (primary) neoplasm, unspecified: Secondary | ICD-10-CM | POA: Diagnosis not present

## 2015-07-21 DIAGNOSIS — C249 Malignant neoplasm of biliary tract, unspecified: Secondary | ICD-10-CM | POA: Diagnosis not present

## 2015-07-21 DIAGNOSIS — C187 Malignant neoplasm of sigmoid colon: Secondary | ICD-10-CM

## 2015-07-21 DIAGNOSIS — C787 Secondary malignant neoplasm of liver and intrahepatic bile duct: Secondary | ICD-10-CM | POA: Diagnosis not present

## 2015-07-21 DIAGNOSIS — R160 Hepatomegaly, not elsewhere classified: Secondary | ICD-10-CM | POA: Diagnosis not present

## 2015-07-21 DIAGNOSIS — K831 Obstruction of bile duct: Secondary | ICD-10-CM | POA: Diagnosis not present

## 2015-07-21 DIAGNOSIS — C240 Malignant neoplasm of extrahepatic bile duct: Secondary | ICD-10-CM

## 2015-07-21 DIAGNOSIS — R933 Abnormal findings on diagnostic imaging of other parts of digestive tract: Secondary | ICD-10-CM | POA: Diagnosis not present

## 2015-07-21 LAB — GLUCOSE, CAPILLARY
GLUCOSE-CAPILLARY: 163 mg/dL — AB (ref 65–99)
Glucose-Capillary: 148 mg/dL — ABNORMAL HIGH (ref 65–99)

## 2015-07-21 MED ORDER — SODIUM CHLORIDE 0.9 % IV SOLN: 500.0000 mL | INTRAVENOUS | Status: DC

## 2015-07-21 NOTE — Op Note (Signed)
[place]  [address] [place], [id number]   COLONOSCOPY PROCEDURE REPORT  PATIENT: Jaime [name], Jaime [name]  MR#: [id number] BIRTHDATE: November 25, 1956 , 47  yrs. old GENDER: male ENDOSCOPIST: Jerene Bears, MD PROCEDURE DATE:  07/21/2015 PROCEDURE:   Colonoscopy, diagnostic and Colonoscopy with biopsy First Screening Colonoscopy - Avg.  risk and is 50 yrs.  old or older - No.  Prior Negative Screening - Now for repeat screening. N/A  History of Adenoma - Now for follow-up colonoscopy & has been > or = to 3 yrs.  N/A  Polyps removed today? Yes ASA CLASS:   Class III INDICATIONS:recent diagnosis of metastatic carcinoma of unknown primary, abnormal CT scan colon. MEDICATIONS: Monitored anesthesia care and Propofol 150 mg IV  DESCRIPTION OF PROCEDURE:   After the risks benefits and alternatives of the procedure were thoroughly explained, informed consent was obtained.  The digital rectal exam revealed no rectal mass.   The LB [id number]  endoscope was introduced through the anus and advanced to the sigmoid colon. No adverse events experienced.   The quality of the prep was good.  (Suprep was used) The instrument was then slowly withdrawn as the colon was fully examined. Estimated blood loss is zero unless otherwise noted in this procedure report.    COLON FINDINGS: A large circumferential mass was found in the sigmoid colon.  This mass lesion is causing significant luminal narrowing in the standard adult colonoscope was unable to traverse this area. The mass is quite friable.  Multiple biopsies of the lesion were performed using cold forceps.  Retroflexed views revealed internal hemorrhoids.       The scope was withdrawn and the procedure completed.  COMPLICATIONS: There were no immediate complications.  ENDOSCOPIC IMPRESSION: Large circumferential mass as described above was found in the sigmoid colon; multiple biopsies of the lesion were performed Incomplete  colonoscopy due to sigmoid narrowing caused by tumor  RECOMMENDATIONS: 1.  Await pathology results 2.  Oncology follow-up  eSigned:  Jerene Bears, MD 07/21/2015 10:53 AM   cc: The Patient and Kavin Leech, MD, Velna Hatchet, MD

## 2015-07-21 NOTE — Patient Instructions (Signed)
YOU HAD AN ENDOSCOPIC PROCEDURE TODAY AT [facility]:   Refer to the procedure report that was given to you for any specific questions about what was found during the examination.  If the procedure report does not answer your questions, please call your gastroenterologist to clarify.  If you requested that your care partner not be given the details of your procedure findings, then the procedure report has been included in a sealed envelope for you to review at your convenience later.  YOU SHOULD EXPECT: Some feelings of bloating in the abdomen. Passage of more gas than usual.  Walking can help get rid of the air that was put into your GI tract during the procedure and reduce the bloating. If you had a lower endoscopy (such as a colonoscopy or flexible sigmoidoscopy) you may notice spotting of blood in your stool or on the toilet paper. If you underwent a bowel prep for your procedure, you may not have a normal bowel movement for a few days.  Please Note:  You might notice some irritation and congestion in your nose or some drainage.  This is from the oxygen used during your procedure.  There is no need for concern and it should clear up in a day or so.  SYMPTOMS TO REPORT IMMEDIATELY:   Following lower endoscopy (colonoscopy or flexible sigmoidoscopy):  Excessive amounts of blood in the stool  Significant tenderness or worsening of abdominal pains  Swelling of the abdomen that is new, acute  Fever of 100F or higher  For urgent or emergent issues, a gastroenterologist can be reached at any hour by calling [phone].   DIET: Your first meal following the procedure should be a small meal and then it is ok to progress to your normal diet. Heavy or fried foods are harder to digest and may make you feel nauseous or bloated.  Likewise, meals heavy in dairy and vegetables can increase bloating.  Drink plenty of fluids but you should avoid alcoholic beverages for 24  hours.  ACTIVITY:  You should plan to take it easy for the rest of today and you should NOT DRIVE or use heavy machinery until tomorrow (because of the sedation medicines used during the test).    FOLLOW UP: Our staff will call the number listed on your records the next business day following your procedure to check on you and address any questions or concerns that you may have regarding the information given to you following your procedure. If we do not reach you, we will leave a message.  However, if you are feeling well and you are not experiencing any problems, there is no need to return our call.  We will assume that you have returned to your regular daily activities without incident.  If any biopsies were taken you will be contacted by phone or by letter within the next 1-3 weeks.  Please call us at [phone] if you have not heard about the biopsies in 3 weeks.    SIGNATURES/CONFIDENTIALITY: You and/or your care partner have signed paperwork which will be entered into your electronic medical record.  These signatures attest to the fact that that the information above on your After Visit Summary has been reviewed and is understood.  Full responsibility of the confidentiality of this discharge information lies with you and/or your care-partner.  Dr. Hilarie Fredrickson will call you with results  Continue your normal medications

## 2015-07-21 NOTE — Progress Notes (Signed)
Pt's weight today- 250.4 lbs

## 2015-07-21 NOTE — Progress Notes (Signed)
Called to room to assist during endoscopic procedure.  Patient ID and intended procedure confirmed with present staff. Received instructions for my participation in the procedure from the performing physician.  

## 2015-07-21 NOTE — Progress Notes (Signed)
A/ox3 pleased with MAC, report to Kristen RN 

## 2015-07-22 ENCOUNTER — Telehealth: Payer: Self-pay | Admitting: *Deleted

## 2015-07-22 NOTE — Telephone Encounter (Signed)
No answer, left message to call if questions or concerns. 

## 2015-07-26 ENCOUNTER — Ambulatory Visit (HOSPITAL_BASED_OUTPATIENT_CLINIC_OR_DEPARTMENT_OTHER): Payer: Managed Care, Other (non HMO) | Admitting: Oncology

## 2015-07-26 ENCOUNTER — Telehealth: Payer: Self-pay | Admitting: Oncology

## 2015-07-26 ENCOUNTER — Encounter: Payer: Self-pay | Admitting: Oncology

## 2015-07-26 ENCOUNTER — Ambulatory Visit: Payer: Managed Care, Other (non HMO)

## 2015-07-26 ENCOUNTER — Other Ambulatory Visit: Payer: Self-pay | Admitting: *Deleted

## 2015-07-26 ENCOUNTER — Encounter: Payer: Self-pay | Admitting: *Deleted

## 2015-07-26 ENCOUNTER — Telehealth: Payer: Self-pay | Admitting: *Deleted

## 2015-07-26 VITALS — BP 119/66 | HR 101 | Temp 97.9°F | Resp 19 | Ht 75.0 in | Wt 253.2 lb

## 2015-07-26 DIAGNOSIS — C787 Secondary malignant neoplasm of liver and intrahepatic bile duct: Secondary | ICD-10-CM | POA: Diagnosis not present

## 2015-07-26 DIAGNOSIS — E119 Type 2 diabetes mellitus without complications: Secondary | ICD-10-CM

## 2015-07-26 DIAGNOSIS — J90 Pleural effusion, not elsewhere classified: Secondary | ICD-10-CM | POA: Diagnosis not present

## 2015-07-26 DIAGNOSIS — D509 Iron deficiency anemia, unspecified: Secondary | ICD-10-CM

## 2015-07-26 DIAGNOSIS — C187 Malignant neoplasm of sigmoid colon: Secondary | ICD-10-CM

## 2015-07-26 DIAGNOSIS — C189 Malignant neoplasm of colon, unspecified: Secondary | ICD-10-CM

## 2015-07-26 DIAGNOSIS — R627 Adult failure to thrive: Secondary | ICD-10-CM

## 2015-07-26 MED ORDER — LIDOCAINE-PRILOCAINE 2.5-2.5 % EX CREA: TOPICAL_CREAM | CUTANEOUS | Status: AC

## 2015-07-26 MED ORDER — ONDANSETRON HCL 8 MG PO TABS: 8.0000 mg | ORAL_TABLET | Freq: Three times a day (TID) | ORAL | Status: DC | PRN

## 2015-07-26 MED ORDER — PROCHLORPERAZINE MALEATE 10 MG PO TABS: 10.0000 mg | ORAL_TABLET | Freq: Four times a day (QID) | ORAL | Status: DC | PRN

## 2015-07-26 NOTE — Progress Notes (Signed)
[place] New Patient Consult   Referring MD: Xion Debruyne 59 y.o.  1956-05-07    Reason for Referral: Colon cancer   HPI: Mr. Lingelbach reports a one-month history of malaise. He developed jaundice and saw Dr. Ardeth Perfect. A laboratory evaluation revealed a hemoglobin of 6.9 and bilirubin of 13.7. He was referred to [facility] for admission. A CT of the abdomen and pelvis on 07/13/2015 revealed a right-sided pleural effusion, multiple pulmonary nodules, and diffuse liver metastases. Biliary ductal dilatation was noted. There is porta hepatis lymphadenopathy. A 2.7 cm right adrenal gland lesion was noted. Wall thickening noted in the sigmoid colon. He was taken to an ERCP procedure on 07/14/2015. The ampulla appeared normal. A severe stricture was noted in the proximal common bile duct with mild dilatation of the left and right intrahepatic ducts. The distal common bile duct appeared normal. Brushings of the biliary stricture were obtained. A stent was placed across the stricture. The cytology ([id number]) revealed malignant cells consistent with adenocarcinoma. The CA 19-9 and CEA returned markedly elevated.  Mr. Lunden was transfused with 2 units of packed red blood cells. He was referred to Dr.Pyrtle and taken to a colonoscopy on 07/21/2015. A circular rectal mass was found in the sigmoid colon. The mass caused luminal narrowing and the area could not be traversed with the colonoscope. Multiple biopsies were performed. The pathology [id number]) confirmed invasive adenocarcinoma.  He reports improvement in the jaundice following placement of the bile duct stent. He continues to be weak. He has difficulty getting up from a sitting position. He is capable of self-care in the home.  Past Medical History  Diagnosis Date  . Diabetes mellitus without complication   . Hypertension   .  metastatic colon cancer-sigmoid colon primary   07/21/2015   . Hyperlipidemia      Past Surgical History  Procedure Laterality Date  . I&d extremity Left 06/19/2013    Procedure: IRRIGATION AND DEBRIDEMENT EXTREMITY;  Surgeon: Newt Minion, MD;  Location: [place];  Service: Orthopedics;  Laterality: Left;  . I&d extremity Left 07/02/2013    Procedure: IRRIGATION AND DEBRIDEMENT EXTREMITY with placement of antibiotic beads and application of wound vac;  Surgeon: Newt Minion, MD;  Location: [place];  Service: Orthopedics;  Laterality: Left;  Irrigation and Debridement Left Foot, Place Beads, Wound VAC  . 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 Ercp N/A 07/14/2015    Procedure: ENDOSCOPIC RETROGRADE CHOLANGIOPANCREATOGRAPHY (ERCP);  Surgeon: Ladene Artist, MD;  Location: [facility] ENDOSCOPY;  Service: Endoscopy;  Laterality: N/A;    . Left cataract surgery                                                                                        April 2016  Medications: Reviewed  Allergies: No Known Allergies  Family history: He is an only child. No known family history of cancer.  Social History:   He lives with his wife in [place]. He works  at [employer]. No history of cigarette use. He reports social alcohol use. He received a red cell transfusion while hospitalized in July 2016. No risk factor for HIV or hepatitis.  History  Alcohol Use  . 0.0 oz/week  . 0 Standard drinks or equivalent per week    Comment: social drinker    History  Smoking status  . Never Smoker   Smokeless tobacco  . Never Used      ROS:   Positives include: Anorexia, intermittent nausea and "reflux ", constipation, cough, jaundice, malaise, leg weakness when getting up from a seated position  A complete ROS was otherwise negative.  Physical Exam:  Blood pressure 119/66, pulse 101, temperature 97.9 F (36.6 C), temperature source Oral,  resp. rate 19, height 6' 3"  (1.905 m), weight 253 lb 3.2 oz (114.851 kg), SpO2 100 %.  HEENT: Scleral icterus, oropharynx without visible mass, neck without mass Lungs: Decreased breath sounds with dullness at the right lower posterior chest, no respiratory distress Cardiac: Regular rate and rhythm Abdomen: Marked hepatomegaly with the liver edge palpable throughout the right mid abdomen extending across the midline. No splenomegaly. No apparent ascites. GU: Testes without mass  Vascular: Pitting edema below the knee bilaterally Lymph nodes: No cervical, supra-clavicular, axillary, or inguinal nodes Neurologic: Alert and oriented, the motor exam appears intact in the upper and lower extremities bilaterally except for 4/5 strength with flexion at the hip bilaterally. He was able to get up from a seated position and ambulate to the examination table with assistance. Skin: Jaundice Musculoskeletal: No spine tenderness   LAB:  CBC  Lab Results  Component Value Date   WBC 10.3 07/19/2015   HGB 7.4* 07/19/2015   HCT 25.6* 07/19/2015   MCV 80.8 07/19/2015   PLT 278 07/19/2015   NEUTROABS 12.9* 06/18/2013     CMP      Component Value Date/Time   NA 132* 07/19/2015 0539   K 4.0 07/19/2015 0539   CL 99* 07/19/2015 0539   CO2 23 07/19/2015 0539   GLUCOSE 131* 07/19/2015 0539   BUN 7 07/19/2015 0539   CREATININE 0.71 07/19/2015 0539   CALCIUM 8.3* 07/19/2015 0539   PROT 6.4* 07/19/2015 0539   ALBUMIN 1.8* 07/19/2015 0539   AST 78* 07/19/2015 0539   ALT 40 07/19/2015 0539   ALKPHOS 635* 07/19/2015 0539   BILITOT 10.1* 07/19/2015 0539   GFRNONAA >60 07/19/2015 0539   GFRAA >60 07/19/2015 0539    Lab Results  Component Value Date   CEA 8250.0* 07/14/2015    Imaging:  As per history of present illness, the chest CT from 07/14/2015 and abdomen/pelvis CT from 07/13/2015 were reviewed with Mr. Kronberg and his wife   Assessment/Plan:   1. 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 Obstructive jaundice, likely secondary to porta hepatis lymphadenopathy, status post placement of a bile duct stent on 07/14/2015  3.   Microcytic anemia secondary to the colon mass  4.   Diabetes  5.    Failure to thrive secondary to metastatic colon cancer   Disposition:   Mr. Ferrall presented with obstructive jaundice. He was noted to have a proximal common bile duct stricture on an ERCP 07/14/2015, likely secondary to metastatic lymphadenopathy. He has extensive liver and lung metastases. A colonoscopy on 07/21/2015  confirmed a sigmoid colon primary. He has been diagnosed with metastatic colon cancer.  I discussed treatment options with Mr. Prabhu and his wife. We discussed "standard "systemic treatment options with chemotherapy and biologic therapy. He has a borderline performance status to receive systemic therapy, but in the absence of systemic therapy he will likely decline over the next few weeks to month.  I recommend a trial of FOLFOX chemotherapy. We will submit the colon tumor tissue for Foundation 1 testing and add an EGFR inhibitor as indicated pending his tolerance of the FOLFOX.  We reviewed the specific toxicities associated with the FOLFOX regimen including the chance for nausea/vomiting, mucositis, diarrhea, alopecia, and hematologic toxicity. We discussed the hyperpigmentation and hand/foot syndrome associated with 5-fluorouracil. We reviewed the various types of neuropathy seen with oxaliplatin. He understands the increased chance of developing toxicities with his poor performance status and abnormal liver function. He would like to proceed with a trial of chemotherapy. He will attend a chemotherapy teaching class.  We will refer  Mr. Williamson for placement of a Port-A-Cath and he will be scheduled for an office visit and first cycle of FOLFOX on 08/01/2015.  His wife reports his blood sugar has been running less than 100. With his poor nutrition and liver metastases I recommended discontinuing metformin and insulin. She will check his blood sugar and contact us if he has a consistent blood sugar greater than 200. I also recommended discontinuing his antihypertensives medications and Lipitor given his low blood pressure and prognosis.  Approximately 50 minutes were spent with the patient today. The majority of the time was used for counseling and coordination of care.    [place], [place] 07/26/2015, 4:49 PM

## 2015-07-26 NOTE — Telephone Encounter (Signed)
lvm for Tiffany in IR to call pt to sched port placement appt

## 2015-07-26 NOTE — Telephone Encounter (Signed)
Per staff message and POF I have scheduled appts. Advised scheduler of appts. JMW  

## 2015-07-26 NOTE — [facility] Oncology Navigator Note (Signed)
Oncology Nurse Navigator Documentation  Oncology Nurse Navigator Flowsheets 07/26/2015  Referral date to RadOnc/MedOnc 07/19/2015  Navigator Encounter Type Initial MedOnc  Patient Visit Type Medonc  Treatment Phase Treatment planning  Barriers/Navigation Needs Family concerns;Education  Education Understanding Cancer/ Treatment Options;Coping with Diagnosis/ Prognosis;Newly Diagnosed Cancer Education  Interventions Referrals;Other  Referrals Social Work;Nutrition/dietician;Home Health for PT; ACS  Support Groups/Services GI;Caregiver Support Class;ACS  Time Spent with Patient 63  Met with patient and wife, Jaime [name] during new patient visit. Explained the role of the GI Nurse Navigator and provided New Patient Packet with information on: 1. Colon cancer 2. Support groups 3. Advanced Directives 4. Fall Safety Plan Showed patient/wife PAC and explained advantages,agreed to ACS reaching out for Health Manager Kit and support. Reviewed chemo teaching class and how it will proceed. Provided with samples of Boost/Ensure to try. Will have dietician see soon as well as CSW (wife was anxious and holding back tears during visit today). Wife tends to answer questions for Jaime [name] frequently and is unsure of her caregiving ability. Made urgent referral to home PT-evaluate and treat for strengthening and ambulation. Answered questions, reviewed current treatment plan using TEACH back and provided emotional support. Provided copy of current treatment plan. Copy of short term disability form given to Jaime [name] in managed care. Wants it sent to [place] as well as a hard copy for their records.  Merceda Elks, RN, BSN GI Oncology [place]

## 2015-07-26 NOTE — Progress Notes (Signed)
Emailed request to pathology for Foundation One-Extended KRAS testing on his colonoscopy case:  Patient: Jaime [name], Jaime [name] Collected: 07/21/2015 Client: [facility] Accession: [id number] Received: 07/21/2015 Lajuan Lines. Pyrtle, MD DOB: 1956-07-26 Age: 59 Gender: M Reported: 07/25/2015  REPORT OF SURGICAL PATHOLOGY

## 2015-07-26 NOTE — Telephone Encounter (Signed)
Gave and printed appt sched and avs fo rpt for July and AUg °

## 2015-07-26 NOTE — Progress Notes (Signed)
Checked in new pt with no financial concerns.Informed pt if chemo is part of treatment, we will contact ins to see if Jaime [name] is required and will obtain if needed.  Pt has my card for billing questions, concerns or if fin assist is needed.

## 2015-07-28 ENCOUNTER — Other Ambulatory Visit: Payer: Managed Care, Other (non HMO)

## 2015-07-28 ENCOUNTER — Other Ambulatory Visit (HOSPITAL_BASED_OUTPATIENT_CLINIC_OR_DEPARTMENT_OTHER): Payer: Managed Care, Other (non HMO)

## 2015-07-28 ENCOUNTER — Encounter: Payer: Self-pay | Admitting: *Deleted

## 2015-07-28 DIAGNOSIS — C787 Secondary malignant neoplasm of liver and intrahepatic bile duct: Secondary | ICD-10-CM

## 2015-07-28 DIAGNOSIS — C187 Malignant neoplasm of sigmoid colon: Secondary | ICD-10-CM

## 2015-07-28 DIAGNOSIS — E119 Type 2 diabetes mellitus without complications: Secondary | ICD-10-CM

## 2015-07-28 DIAGNOSIS — C189 Malignant neoplasm of colon, unspecified: Secondary | ICD-10-CM

## 2015-07-28 DIAGNOSIS — D509 Iron deficiency anemia, unspecified: Secondary | ICD-10-CM | POA: Diagnosis not present

## 2015-07-28 LAB — COMPREHENSIVE METABOLIC PANEL (CC13)
ALK PHOS: 527 U/L — AB (ref 40–150)
ALT: 20 U/L (ref 0–55)
ANION GAP: 10 meq/L (ref 3–11)
AST: 42 U/L — ABNORMAL HIGH (ref 5–34)
Albumin: 1.7 g/dL — ABNORMAL LOW (ref 3.5–5.0)
BUN: 23.5 mg/dL (ref 7.0–26.0)
CALCIUM: 8.8 mg/dL (ref 8.4–10.4)
CO2: 28 meq/L (ref 22–29)
CREATININE: 1.1 mg/dL (ref 0.7–1.3)
Chloride: 102 mEq/L (ref 98–109)
EGFR: 70 mL/min/{1.73_m2} — AB (ref >90)
Glucose: 173 mg/dl — ABNORMAL HIGH (ref 70–140)
Potassium: 3.5 mEq/L (ref 3.5–5.1)
SODIUM: 140 meq/L (ref 136–145)
Total Bilirubin: 6.06 mg/dL (ref 0.20–1.20)
Total Protein: 7 g/dL (ref 6.4–8.3)

## 2015-07-28 LAB — CBC WITH DIFFERENTIAL/PLATELET
BASO%: 0.1 % (ref 0.0–2.0)
Basophils Absolute: 0 10*3/uL (ref 0.0–0.1)
EOS%: 0.6 % (ref 0.0–7.0)
Eosinophils Absolute: 0.1 10*3/uL (ref 0.0–0.5)
HCT: 30.8 % — ABNORMAL LOW (ref 38.4–49.9)
HEMOGLOBIN: 9.1 g/dL — AB (ref 13.0–17.1)
LYMPH#: 0.6 10*3/uL — AB (ref 0.9–3.3)
LYMPH%: 4 % — AB (ref 14.0–49.0)
MCH: 25.5 pg — AB (ref 27.2–33.4)
MCHC: 29.5 g/dL — ABNORMAL LOW (ref 32.0–36.0)
MCV: 86.3 fL (ref 79.3–98.0)
MONO#: 1.5 10*3/uL — ABNORMAL HIGH (ref 0.1–0.9)
MONO%: 11 % (ref 0.0–14.0)
NEUT%: 84.3 % — ABNORMAL HIGH (ref 39.0–75.0)
NEUTROS ABS: 11.6 10*3/uL — AB (ref 1.5–6.5)
PLATELETS: 443 10*3/uL — AB (ref 140–400)
RBC: 3.57 10*6/uL — ABNORMAL LOW (ref 4.20–5.82)
RDW: 22.6 % — ABNORMAL HIGH (ref 11.0–14.6)
WBC: 13.8 10*3/uL — AB (ref 4.0–10.3)

## 2015-07-28 LAB — HOLD TUBE, BLOOD BANK

## 2015-07-30 ENCOUNTER — Other Ambulatory Visit: Payer: Self-pay | Admitting: Physician Assistant

## 2015-07-31 ENCOUNTER — Other Ambulatory Visit: Payer: Self-pay | Admitting: Oncology

## 2015-07-31 DIAGNOSIS — C189 Malignant neoplasm of colon, unspecified: Secondary | ICD-10-CM

## 2015-08-01 ENCOUNTER — Ambulatory Visit: Payer: Managed Care, Other (non HMO) | Admitting: Oncology

## 2015-08-01 ENCOUNTER — Ambulatory Visit (HOSPITAL_COMMUNITY)
Admission: RE | Admit: 2015-08-01 | Discharge: 2015-08-01 | Disposition: A | Discharge status: Home / Self Care | Payer: Managed Care, Other (non HMO) | Source: Ambulatory Visit | Attending: Oncology | Admitting: Oncology

## 2015-08-01 ENCOUNTER — Other Ambulatory Visit: Payer: Self-pay | Admitting: Oncology

## 2015-08-01 ENCOUNTER — Encounter (HOSPITAL_COMMUNITY): Payer: Self-pay

## 2015-08-01 DIAGNOSIS — R627 Adult failure to thrive: Secondary | ICD-10-CM | POA: Insufficient documentation

## 2015-08-01 DIAGNOSIS — C787 Secondary malignant neoplasm of liver and intrahepatic bile duct: Secondary | ICD-10-CM | POA: Insufficient documentation

## 2015-08-01 DIAGNOSIS — I2510 Atherosclerotic heart disease of native coronary artery without angina pectoris: Secondary | ICD-10-CM | POA: Insufficient documentation

## 2015-08-01 DIAGNOSIS — J90 Pleural effusion, not elsewhere classified: Secondary | ICD-10-CM | POA: Diagnosis not present

## 2015-08-01 DIAGNOSIS — R17 Unspecified jaundice: Secondary | ICD-10-CM | POA: Insufficient documentation

## 2015-08-01 DIAGNOSIS — K831 Obstruction of bile duct: Secondary | ICD-10-CM | POA: Diagnosis not present

## 2015-08-01 DIAGNOSIS — I10 Essential (primary) hypertension: Secondary | ICD-10-CM | POA: Insufficient documentation

## 2015-08-01 DIAGNOSIS — D649 Anemia, unspecified: Secondary | ICD-10-CM | POA: Diagnosis not present

## 2015-08-01 DIAGNOSIS — E119 Type 2 diabetes mellitus without complications: Secondary | ICD-10-CM | POA: Diagnosis not present

## 2015-08-01 DIAGNOSIS — C189 Malignant neoplasm of colon, unspecified: Secondary | ICD-10-CM | POA: Diagnosis present

## 2015-08-01 DIAGNOSIS — E785 Hyperlipidemia, unspecified: Secondary | ICD-10-CM | POA: Diagnosis not present

## 2015-08-01 DIAGNOSIS — C779 Secondary and unspecified malignant neoplasm of lymph node, unspecified: Secondary | ICD-10-CM | POA: Diagnosis not present

## 2015-08-01 LAB — PROTIME-INR
INR: 1.1 (ref 0.00–1.49)
Prothrombin Time: 14.4 seconds (ref 11.6–15.2)

## 2015-08-01 LAB — CBC
HCT: 34.5 % — ABNORMAL LOW (ref 39.0–52.0)
Hemoglobin: 9.9 g/dL — ABNORMAL LOW (ref 13.0–17.0)
MCH: 25.6 pg — ABNORMAL LOW (ref 26.0–34.0)
MCHC: 28.7 g/dL — ABNORMAL LOW (ref 30.0–36.0)
MCV: 89.1 fL (ref 78.0–100.0)
PLATELETS: 393 10*3/uL (ref 150–400)
RBC: 3.87 MIL/uL — AB (ref 4.22–5.81)
RDW: 22.6 % — ABNORMAL HIGH (ref 11.5–15.5)
WBC: 15.2 10*3/uL — ABNORMAL HIGH (ref 4.0–10.5)

## 2015-08-01 LAB — GLUCOSE, CAPILLARY: GLUCOSE-CAPILLARY: 115 mg/dL — AB (ref 65–99)

## 2015-08-01 LAB — APTT: APTT: 28 s (ref 24–37)

## 2015-08-01 MED ORDER — CEFAZOLIN SODIUM-DEXTROSE 2-3 GM-% IV SOLR
INTRAVENOUS | Status: AC
  Filled 2015-08-01: qty 50

## 2015-08-01 MED ORDER — SODIUM CHLORIDE 0.9 % IV SOLN
INTRAVENOUS | Status: DC
  Administered 2015-08-01: 08:00:00 via INTRAVENOUS

## 2015-08-01 MED ORDER — FENTANYL CITRATE (PF) 100 MCG/2ML IJ SOLN
INTRAMUSCULAR | Status: AC | PRN
  Administered 2015-08-01: 50 ug via INTRAVENOUS

## 2015-08-01 MED ORDER — HEPARIN SOD (PORK) LOCK FLUSH 100 UNIT/ML IV SOLN
INTRAVENOUS | Status: AC | PRN
  Administered 2015-08-01: 500 [IU]

## 2015-08-01 MED ORDER — CEFAZOLIN SODIUM-DEXTROSE 2-3 GM-% IV SOLR
2.0000 g | Freq: Once | INTRAVENOUS | Status: AC
  Administered 2015-08-01: 2 g via INTRAVENOUS

## 2015-08-01 MED ORDER — ONDANSETRON HCL 4 MG/2ML IJ SOLN
INTRAMUSCULAR | Status: AC
  Filled 2015-08-01: qty 2

## 2015-08-01 MED ORDER — HEPARIN SOD (PORK) LOCK FLUSH 100 UNIT/ML IV SOLN
INTRAVENOUS | Status: AC
  Filled 2015-08-01: qty 5

## 2015-08-01 MED ORDER — LIDOCAINE-EPINEPHRINE 2 %-1:100000 IJ SOLN
INTRAMUSCULAR | Status: AC
  Filled 2015-08-01: qty 1

## 2015-08-01 MED ORDER — LIDOCAINE HCL 1 % IJ SOLN
INTRAMUSCULAR | Status: AC
Start: 2015-08-01 — End: 2015-08-01
  Filled 2015-08-01: qty 20

## 2015-08-01 MED ORDER — MIDAZOLAM HCL 2 MG/2ML IJ SOLN
INTRAMUSCULAR | Status: AC | PRN
  Administered 2015-08-01 (×2): 0.5 mg via INTRAVENOUS
  Administered 2015-08-01 (×2): 1 mg via INTRAVENOUS

## 2015-08-01 MED ORDER — FENTANYL CITRATE (PF) 100 MCG/2ML IJ SOLN
INTRAMUSCULAR | Status: AC
  Filled 2015-08-01: qty 4

## 2015-08-01 MED ORDER — MIDAZOLAM HCL 2 MG/2ML IJ SOLN
INTRAMUSCULAR | Status: AC
  Filled 2015-08-01: qty 6

## 2015-08-01 MED ORDER — ONDANSETRON HCL 4 MG/2ML IJ SOLN
INTRAMUSCULAR | Status: AC | PRN
  Administered 2015-08-01: 4 mg via INTRAVENOUS

## 2015-08-01 NOTE — Procedures (Signed)
Successful RT IJ POWER PORT INSERTION NO COMP STABLE TIP SVC/RA READY FOR USE FULL REPORT IN PACS

## 2015-08-01 NOTE — Sedation Documentation (Signed)
Pt nauseated, dry heaving.  Assisted pts head to the left, suction available.  Zofran 4mg  IV ordered.

## 2015-08-01 NOTE — Progress Notes (Signed)
Patient ID: Jaime [name], male   DOB: 10-Jul-1956, 59 y.o.   MRN: [id number]    Chief Complaint: Patient was seen in consultation today for port a cath placement at the request of Sherrill,Gary B  Referring Physician(s): Sherrill,Gary B  History of Present Illness: Jaime [name] is a 59 y.o. male with history of stage IV colon cancer, obstructive jaundice with recent bile duct stent placement, anemia ,DM and FTT. He was seen by our service on 07/18/15 for liver biopsy workup . He presents today for port a cath placement for chemotherapy.   Past Medical History  Diagnosis Date  . Diabetes mellitus without complication   . Hypertension   . Metastatic carcinoma involving liver with unknown primary site   . Hyperlipidemia     Past Surgical History  Procedure Laterality Date  . I&d extremity Left 06/19/2013    Procedure: IRRIGATION AND DEBRIDEMENT EXTREMITY;  Surgeon: Newt Minion, MD;  Location: [place];  Service: Orthopedics;  Laterality: Left;  . I&d extremity Left 07/02/2013    Procedure: IRRIGATION AND DEBRIDEMENT EXTREMITY with placement of antibiotic beads and application of wound vac;  Surgeon: Newt Minion, MD;  Location: [place];  Service: Orthopedics;  Laterality: Left;  Irrigation and Debridement Left Foot, Place Beads, Wound VAC  . Amputation Left 01/08/2014    Procedure: AMPUTATION RAY;  Surgeon: Newt Minion, MD;  Location: [place];  Service: Orthopedics;  Laterality: Left;  Left Foot 5th Ray Amputation  . Ercp N/A 07/14/2015    Procedure: ENDOSCOPIC RETROGRADE CHOLANGIOPANCREATOGRAPHY (ERCP);  Surgeon: Ladene Artist, MD;  Location: [facility] ENDOSCOPY;  Service: Endoscopy;  Laterality: N/A;    Allergies: Review of patient's allergies indicates no known allergies.  Medications: Prior to Admission medications   Medication Sig Start Date End Date Taking? Authorizing Provider  ferrous sulfate 325 (65 FE) MG tablet Take 325 mg by mouth 2 (two) times daily with a meal.   Yes  Historical Provider, MD  lidocaine-prilocaine (EMLA) cream Apply small amount of cream over port area 1-2 hours prior to treatment and cover with plastic wrap.  DO NOT RUB IN 07/26/15  Yes Ladell Pier, MD  ondansetron (ZOFRAN) 8 MG tablet Take 1 tablet (8 mg total) by mouth every 8 (eight) hours as needed for nausea or vomiting. 07/26/15  Yes Ladell Pier, MD  prochlorperazine (COMPAZINE) 10 MG tablet Take 1 tablet (10 mg total) by mouth every 6 (six) hours as needed for nausea or vomiting. 07/26/15  Yes Ladell Pier, MD  traMADol (ULTRAM) 50 MG tablet Take 1 tablet (50 mg total) by mouth every 6 (six) hours as needed. 07/19/15  Yes Theodis Blaze, MD     Family History  Problem Relation Age of Onset  . Heart failure Mother   . Heart attack Mother   . Leukemia Father   . Colon cancer Neg Hx     History   Social History  . Marital Status: Married    Spouse Name: N/A  . Number of Children: N/A  . Years of Education: N/A   Social History Main Topics  . Smoking status: Never Smoker   . Smokeless tobacco: Never Used  . Alcohol Use: 0.0 oz/week    0 Standard drinks or equivalent per week     Comment: social drinker  . Drug Use: No  . Sexual Activity: Yes   Other Topics Concern  . None   Social History Narrative   Lives with wife at home  Review of Systems     Constitutional: Positive for appetite change and fatigue. Negative for fever and chills.  Respiratory: Positive for cough. Negative for shortness of breath.   Cardiovascular: Negative for chest pain.  Gastrointestinal: Positive for constipation. Negative for nausea, vomiting, abdominal pain and blood in stool.       Occ nausea  Genitourinary: Negative for dysuria and hematuria.  Musculoskeletal:       Occ back pain  Neurological: Positive for weakness. Negative for headaches.    Vital Signs: BP 111/72 mmHg  Pulse 72  Temp(Src) 97.9 F (36.6 C) (Oral)  Resp 20  SpO2 100%  Physical Exam    Constitutional: He is oriented to person, place, and time.  Sl jaundiced WM in NAD  Cardiovascular: Normal rate and regular rhythm.   Pulmonary/Chest: Effort normal.  Dim BS rt base, left clear  Abdominal: Soft. Bowel sounds are normal. He exhibits distension. There is no tenderness.  hepatomegaly  Musculoskeletal: Normal range of motion. He exhibits edema.  Neurological: He is alert and oriented to person, place, and time.    Mallampati Score:     Imaging: Ct Head W Wo Contrast  07/14/2015   CLINICAL DATA:  Liver and lung neoplasm.  EXAM: CT HEAD WITHOUT AND WITH CONTRAST  TECHNIQUE: Contiguous axial images were obtained from the base of the skull through the vertex without and with intravenous contrast  CONTRAST:  74mL OMNIPAQUE IOHEXOL 300 MG/ML  SOLN  COMPARISON:  CT of the chest, abdomen, and pelvis.  FINDINGS: Minimal atrophy is within normal limits for age. No acute infarct, hemorrhage, or mass lesion is present. The ventricles are of normal size. No significant extraaxial fluid collection is present.  The paranasal sinuses and left mastoid air cells are clear. There is fluid in the right middle ear cavity and mastoid air cells without osseous destruction. The left lens is replaced. The globes and orbits are otherwise intact. The subcutaneous nodule in the posterior right neck measures 9 x 5 mm, likely inflammatory nodule.  The postcontrast images demonstrate no pathologic enhancement.  IMPRESSION: 1. Normal CT appearance of the brain for age. No acute item al day or evidence for metastatic disease. 2. Right middle ear and mastoid effusion. No obstructing nasopharyngeal lesion is evident.   Electronically Signed   By: San Morelle M.D.   On: 07/14/2015 19:59   Ct Chest W Contrast  07/14/2015   CLINICAL DATA:  59 yr old male with c/o fatigue x 3 weeks, jaundice x 1 week. Last night CT abd and pelvis showed liver lesions and lung lesions. This is for cancer staging. Hx: DM2, HTN,  anemia, elevated LFT;s  EXAM: CT CHEST WITH CONTRAST  TECHNIQUE: Multidetector CT imaging of the chest was performed during intravenous contrast administration.  CONTRAST:  33mL OMNIPAQUE IOHEXOL 300 MG/ML  SOLN  COMPARISON:  Abdomen pelvis CT, 07/13/2015. Chest radiograph, 07/01/2013.  FINDINGS: Thoracic inlet: Vague low-density area in the left lobe of the thyroid may reflect a sub cm nodule. Thyroid otherwise unremarkable. No neck base masses or adenopathy.  Mediastinum and hila: Heart normal in size. Mild coronary artery calcifications. Great vessels normal in caliber. Thoracic aorta unremarkable. Mildly enlarged right subcarinal lymph node measuring 11 mm in short axis. There are additional prominent and enlarged lower posterior mediastinal lymph nodes, largest lying to the right of the lower esophagus measuring 14 mm in short axis. No hilar masses or enlarged lymph nodes.  Lungs and pleura: Moderate right pleural effusion. Multiple  bilateral pulmonary nodules. The 2 largest on the left are a left lower lobe nodule measuring 13 mm in diameter, image 35, series 3 and a left upper lobe nodule measuring 12 mm, image 23. On the right, the largest nodule is in the posterior right lower lobe measuring 19 mm. There is mild subsegmental atelectasis in the lung bases, greatest in the right lower lobe adjacent to the pleural effusion. No lung consolidation or edema.  Limited upper abdomen: Numerous liver masses consistent with widespread metastatic disease. This enlarges the liver. Small amount of ascites. Right adrenal mass. Multiple prominent upper abdominal lymph nodes. Findings stable from the recent abdomen pelvis CT.  Musculoskeletal:  No osteoblastic or osteolytic lesions.  IMPRESSION: 1. Numerous metastatic lesions in the lungs. There is no convincing lung primary malignancy. Metastatic adenopathy is seen in the lower posterior mediastinum. 2. Moderate right pleural effusion. 3. No acute findings in the lungs. 4.  Extensive liver metastatic disease as described on the recent prior study. Right adrenal mass is also likely metastatic disease. Metastatic upper abdominal adenopathy.   Electronically Signed   By: Lajean Manes M.D.   On: 07/14/2015 20:00   Ct Abdomen Pelvis W Contrast  07/13/2015   CLINICAL DATA:  Jaundice fatigue for 3 weeks  EXAM: CT ABDOMEN AND PELVIS WITH CONTRAST  TECHNIQUE: Multidetector CT imaging of the abdomen and pelvis was performed using the standard protocol following bolus administration of intravenous contrast.  CONTRAST:  134mL OMNIPAQUE IOHEXOL 300 MG/ML  SOLN  COMPARISON:  None.  FINDINGS: Lung bases demonstrate a moderate right-sided pleural effusion. Additionally multiple pulmonary nodules are identified. The largest of these in the left lower lobe measures 13 mm. The largest in the right lower lobe measures 19 mm.  The liver demonstrates diffuse involvement with multiple peripherally enhancing lesions. The largest of these near the dome of the liver measures approximately 13.6 x 10.6 cm in greatest transverse and AP dimensions respectively. Some biliary ductal dilatation is noted. Porta hepatis lymphadenopathy is noted as well as portacaval lymphadenopathy. Spleen is within normal limits. The pancreas is somewhat atrophic. The adrenal glands are within normal limits with the exception of a 2.7 cm lesion within the right adrenal gland best seen on image number 30 of series 201. Although this may represent an adenoma the possibility of metastatic disease deserves consideration given the changes in the lungs and liver. The kidneys are well visualized bilaterally. A normal excretion pattern is seen. Small nonobstructing right renal stone is noted. No focal mass lesion is noted. Mild ascites is noted within the abdomen and pelvis. The bladder is partially distended. No pelvic mass lesion is seen. Diverticular change of the colon is noted. In the midportion of the sigmoid colon there is some wall  thickening identified. It would be difficult to exclude an underlying lesion on the basis of this exam. This areas best seen on images 65-67 of series 201 and on the sagittal projections and image number 113 of series 204.  The bony structures demonstrate degenerative change of the lumbar spine. No definitive lytic or blastic lesions are seen.  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 There is an associated right-sided pleural effusion and ascites identified.  Thickening of the sigmoid colon which may represent a focal lesion. Direct visualization may be helpful.  Right adrenal gland lesion. This may represent an adenoma although metastatic disease is not excluded.  Right renal calculus.   Electronically Signed   By: Linus Mako.D.  On: 07/13/2015 20:34   Dg Ercp  07/14/2015   CLINICAL DATA:  Bile duct obstruction  EXAM: ERCP  TECHNIQUE: Multiple spot images obtained with the fluoroscopic device and submitted for interpretation post-procedure.  FLUOROSCOPY TIME:  Radiation Exposure Index (as provided by the fluoroscopic device):  If the device does not provide the exposure index:  Fluoroscopy Time:  2 minutes and 49 seconds  Number of Acquired Images:  13  COMPARISON:  None.  FINDINGS: Multiple images demonstrate cannulation of the common bile duct and contrast filling the biliary tree. A biliary stent has been placed.  IMPRESSION: ERCP and stent placement.  These images were submitted for radiologic interpretation only. Please see the procedural report for the amount of contrast and the fluoroscopy time utilized.   Electronically Signed   By: Marybelle Killings M.D.   On: 07/14/2015 16:46    Labs:  CBC:  Recent Labs  07/18/15 0518 07/19/15 0539 07/28/15 1214 08/01/15 0826  WBC 10.0 10.3 13.8* 15.2*  HGB 7.6* 7.4* 9.1* 9.9*  HCT 26.7* 25.6* 30.8* 34.5*  PLT 376 278 443* 393    COAGS:  Recent Labs  07/13/15 1456 07/14/15 0522 07/18/15 0518 08/01/15 0826    INR 1.80* 1.71* 1.15 1.10  APTT  --   --   --  28    BMP:  Recent Labs  07/16/15 0358 07/17/15 0453 07/18/15 0518 07/19/15 0539 07/28/15 1214  NA 137 134* 135 132* 140  K 3.9 4.8 4.2 4.0 3.5  CL 105 105 102 99*  --   CO2 25 22 25 23 28   GLUCOSE 148* 129* 124* 131* 173*  BUN 11 8 <5* 7 23.5  CALCIUM 8.1* 8.2* 8.5* 8.3* 8.8  CREATININE 0.74 0.60* 0.66 0.71 1.1  GFRNONAA >60 >60 >60 >60  --   GFRAA >60 >60 >60 >60  --     LIVER FUNCTION TESTS:  Recent Labs  07/15/15 0408 07/16/15 0358 07/19/15 0539 07/28/15 1214  BILITOT 13.9* 13.0* 10.1* 6.06*  AST 112* 91* 78* 42*  ALT 58 55 40 20  ALKPHOS 832* 769* 635* 527*  PROT 6.0* 5.9* 6.4* 7.0  ALBUMIN 1.7* 1.7* 1.8* 1.7*    TUMOR MARKERS:  Recent Labs  07/14/15 0910  CEA 8250.0*  CA199 6313*    Assessment and Plan: Jaime [name] is a 59 y.o. male with history of stage IV colon cancer, obstructive jaundice with recent bile duct stent placement, anemia ,DM and FTT. He was seen by our service on 07/18/15 for liver biopsy workup . He presents today for port a cath placement for chemotherapy. Risks and benefits discussed with the patient/wife including, but not limited to bleeding, infection, pneumothorax, or fibrin sheath development and need for additional procedures.All of the patient's questions were answered, patient is agreeable to proceed.Consent signed and in chart.Elevated WBC noted- pt afebrile and reports no recent infections- d/w Dr. Annamaria Boots and plans are to proceed with port placement today.     Thank you for this interesting consult.  I greatly enjoyed meeting Jaime [name] and look forward to participating in their care.  A copy of this report was sent to the requesting provider on this date.  Signed: D. Rowe Robert 08/01/2015, 9:15 AM   I spent a total of 25 minutes in face to face in clinical consultation, greater than 50% of which was counseling/coordinating care for port a cath  placement

## 2015-08-01 NOTE — Discharge Instructions (Signed)
Implanted Port Insertion, Care After Refer to this sheet in the next few weeks. These instructions provide you with information on caring for yourself after your procedure. Your health care provider may also give you more specific instructions. Your treatment has been planned according to current medical practices, but problems sometimes occur. Call your health care provider if you have any problems or questions after your procedure. WHAT TO EXPECT AFTER THE PROCEDURE After your procedure, it is typical to have the following:   Discomfort at the port insertion site. Ice packs to the area will help.  Bruising on the skin over the port. This will subside in 3-4 days. HOME CARE INSTRUCTIONS  After your port is placed, you will get a manufacturer's information card. The card has information about your port. Keep this card with you at all times.   Know what kind of port you have. There are many types of ports available.   Wear a medical alert bracelet in case of an emergency. This can help alert health care workers that you have a port.   The port can stay in for as long as your health care provider believes it is necessary.   A home health care nurse may give medicines and take care of the port.   You or a family member can get special training and directions for giving medicine and taking care of the port at home.  SEEK MEDICAL CARE IF:   Your port does not flush or you are unable to get a blood return.   You have a fever or chills. SEEK IMMEDIATE MEDICAL CARE IF:  You have new fluid or pus coming from your incision.   You notice a bad smell coming from your incision site.   You have swelling, pain, or more redness at the incision or port site.   You have chest pain or shortness of breath. Document Released: 10/07/2013 Document Revised: 12/22/2013 Document Reviewed: 10/07/2013 [facility] Patient Information 2015 [place], [place]. This information is not intended to replace  advice given to you by your health care provider. Make sure you discuss any questions you have with your health care provider.  May remove dressing and bathe or shower 24 to 48 hours after procedure. Keep wound clean and dry.  Implanted [facility] Guide An implanted port is a type of central line that is placed under the skin. Central lines are used to provide IV access when treatment or nutrition needs to be given through a person's veins. Implanted ports are used for long-term IV access. An implanted port may be placed because:   You need IV medicine that would be irritating to the small veins in your hands or arms.   You need long-term IV medicines, such as antibiotics.   You need IV nutrition for a long period.   You need frequent blood draws for lab tests.   You need dialysis.  Implanted ports are usually placed in the chest area, but they can also be placed in the upper arm, the abdomen, or the leg. An implanted port has two main parts:   Reservoir. The reservoir is round and will appear as a small, raised area under your skin. The reservoir is the part where a needle is inserted to give medicines or draw blood.   Catheter. The catheter is a thin, flexible tube that extends from the reservoir. The catheter is placed into a large vein. Medicine that is inserted into the reservoir goes into the catheter and then into  the vein.  HOW WILL I CARE FOR MY INCISION SITE? Do not get the incision site wet. Bathe or shower as directed by your health care provider.  HOW IS MY PORT ACCESSED? Special steps must be taken to access the port:   Before the port is accessed, a numbing cream can be placed on the skin. This helps numb the skin over the port site.   Your health care provider uses a sterile technique to access the port.  Your health care provider must put on a mask and sterile gloves.  The skin over your port is cleaned carefully with an antiseptic and allowed to dry.  The  port is gently pinched between sterile gloves, and a needle is inserted into the port.  Only "non-coring" port needles should be used to access the port. Once the port is accessed, a blood return should be checked. This helps ensure that the port is in the vein and is not clogged.   If your port needs to remain accessed for a constant infusion, a clear (transparent) bandage will be placed over the needle site. The bandage and needle will need to be changed every week, or as directed by your health care provider.   Keep the bandage covering the needle clean and dry. Do not get it wet. Follow your health care provider's instructions on how to take a shower or bath while the port is accessed.   If your port does not need to stay accessed, no bandage is needed over the port.  WHAT IS FLUSHING? Flushing helps keep the port from getting clogged. Follow your health care provider's instructions on how and when to flush the port. Ports are usually flushed with saline solution or a medicine called heparin. The need for flushing will depend on how the port is used.   If the port is used for intermittent medicines or blood draws, the port will need to be flushed:   After medicines have been given.   After blood has been drawn.   As part of routine maintenance.   If a constant infusion is running, the port may not need to be flushed.  HOW LONG WILL MY PORT STAY IMPLANTED? The port can stay in for as long as your health care provider thinks it is needed. When it is time for the port to come out, surgery will be done to remove it. The procedure is similar to the one performed when the port was put in.  WHEN SHOULD I SEEK IMMEDIATE MEDICAL CARE? When you have an implanted port, you should seek immediate medical care if:   You notice a bad smell coming from the incision site.   You have swelling, redness, or drainage at the incision site.   You have more swelling or pain at the port site or  the surrounding area.   You have a fever that is not controlled with medicine. Document Released: 12/17/2005 Document Revised: 10/07/2013 Document Reviewed: 08/24/2013 [facility] Patient Information 2015 [place], [place]. This information is not intended to replace advice given to you by your health care provider. Make sure you discuss any questions you have with your health care provider. Conscious Sedation Sedation is the use of medicines to promote relaxation and relieve discomfort and anxiety. Conscious sedation is a type of sedation. Under conscious sedation you are less alert than normal but are still able to respond to instructions or stimulation. Conscious sedation is used during short medical and dental procedures. It is milder than deep  sedation or general anesthesia and allows you to return to your regular activities sooner.  LET [facility] CARE PROVIDER KNOW ABOUT:   Any allergies you have.  All medicines you are taking, including vitamins, herbs, eye drops, creams, and over-the-counter medicines.  Use of steroids (by mouth or creams).  Previous problems you or members of your family have had with the use of anesthetics.  Any blood disorders you have.  Previous surgeries you have had.  Medical conditions you have.  Possibility of pregnancy, if this applies.  Use of cigarettes, alcohol, or illegal drugs. RISKS AND COMPLICATIONS Generally, this is a safe procedure. However, as with any procedure, problems can occur. Possible problems include:  Oversedation.  Trouble breathing on your own. You may need to have a breathing tube until you are awake and breathing on your own.  Allergic reaction to any of the medicines used for the procedure. BEFORE THE PROCEDURE  You may have blood tests done. These tests can help show how well your kidneys and liver are working. They can also show how well your blood clots.  A physical exam will be done.  Only take medicines as  directed by your health care provider. You may need to stop taking medicines (such as blood thinners, aspirin, or nonsteroidal anti-inflammatory drugs) before the procedure.   Do not eat or drink at least 6 hours before the procedure or as directed by your health care provider.  Arrange for a responsible adult, family member, or friend to take you home after the procedure. He or she should stay with you for at least 24 hours after the procedure, until the medicine has worn off. PROCEDURE   An intravenous (IV) catheter will be inserted into one of your veins. Medicine will be able to flow directly into your body through this catheter. You may be given medicine through this tube to help prevent pain and help you relax.  The medical or dental procedure will be done. AFTER THE PROCEDURE  You will stay in a recovery area until the medicine has worn off. Your blood pressure and pulse will be checked.   Depending on the procedure you had, you may be allowed to go home when you can tolerate liquids and your pain is under control. Document Released: 09/11/2001 Document Revised: 12/22/2013 Document Reviewed: 08/24/2013 [facility] Patient Information 2015 [place], [place]. This information is not intended to replace advice given to you by your health care provider. Make sure you discuss any questions you have with your health care provider.

## 2015-08-02 ENCOUNTER — Ambulatory Visit: Payer: Managed Care, Other (non HMO) | Admitting: Nutrition

## 2015-08-02 ENCOUNTER — Ambulatory Visit (HOSPITAL_BASED_OUTPATIENT_CLINIC_OR_DEPARTMENT_OTHER): Payer: Managed Care, Other (non HMO) | Admitting: Oncology

## 2015-08-02 ENCOUNTER — Ambulatory Visit (HOSPITAL_BASED_OUTPATIENT_CLINIC_OR_DEPARTMENT_OTHER): Payer: Managed Care, Other (non HMO)

## 2015-08-02 ENCOUNTER — Telehealth: Payer: Self-pay | Admitting: Oncology

## 2015-08-02 VITALS — BP 113/71 | HR 101 | Temp 98.0°F | Resp 18 | Ht 75.0 in

## 2015-08-02 VITALS — HR 98

## 2015-08-02 DIAGNOSIS — E119 Type 2 diabetes mellitus without complications: Secondary | ICD-10-CM

## 2015-08-02 DIAGNOSIS — C787 Secondary malignant neoplasm of liver and intrahepatic bile duct: Secondary | ICD-10-CM | POA: Diagnosis not present

## 2015-08-02 DIAGNOSIS — Z5111 Encounter for antineoplastic chemotherapy: Secondary | ICD-10-CM | POA: Diagnosis not present

## 2015-08-02 DIAGNOSIS — D509 Iron deficiency anemia, unspecified: Secondary | ICD-10-CM

## 2015-08-02 DIAGNOSIS — C187 Malignant neoplasm of sigmoid colon: Secondary | ICD-10-CM | POA: Diagnosis not present

## 2015-08-02 DIAGNOSIS — C189 Malignant neoplasm of colon, unspecified: Secondary | ICD-10-CM

## 2015-08-02 LAB — COMPREHENSIVE METABOLIC PANEL (CC13)
ALBUMIN: 1.7 g/dL — AB (ref 3.5–5.0)
ALT: 15 U/L (ref 0–55)
AST: 40 U/L — ABNORMAL HIGH (ref 5–34)
Alkaline Phosphatase: 474 U/L — ABNORMAL HIGH (ref 40–150)
Anion Gap: 10 mEq/L (ref 3–11)
BILIRUBIN TOTAL: 4.85 mg/dL — AB (ref 0.20–1.20)
BUN: 16 mg/dL (ref 7.0–26.0)
CO2: 27 mEq/L (ref 22–29)
Calcium: 8.6 mg/dL (ref 8.4–10.4)
Chloride: 103 mEq/L (ref 98–109)
Creatinine: 0.7 mg/dL (ref 0.7–1.3)
GLUCOSE: 147 mg/dL — AB (ref 70–140)
Potassium: 4.2 mEq/L (ref 3.5–5.1)
Sodium: 140 mEq/L (ref 136–145)
Total Protein: 6.6 g/dL (ref 6.4–8.3)

## 2015-08-02 MED ORDER — ONDANSETRON HCL 40 MG/20ML IJ SOLN
Freq: Once | INTRAMUSCULAR | Status: AC
  Administered 2015-08-02: 10:00:00 via INTRAVENOUS
  Filled 2015-08-02: qty 4

## 2015-08-02 MED ORDER — OXALIPLATIN CHEMO INJECTION 100 MG/20ML
65.0000 mg/m2 | Freq: Once | INTRAVENOUS | Status: AC
  Administered 2015-08-02: 160 mg via INTRAVENOUS
  Filled 2015-08-02: qty 32

## 2015-08-02 MED ORDER — DEXTROSE 5 % IV SOLN
Freq: Once | INTRAVENOUS | Status: AC
  Administered 2015-08-02: 10:00:00 via INTRAVENOUS

## 2015-08-02 MED ORDER — SODIUM CHLORIDE 0.9 % IV SOLN
1800.0000 mg/m2 | INTRAVENOUS | Status: DC
  Administered 2015-08-02: 4450 mg via INTRAVENOUS
  Filled 2015-08-02: qty 89

## 2015-08-02 MED ORDER — LEUCOVORIN CALCIUM INJECTION 350 MG
299.5000 mg/m2 | Freq: Once | INTRAVENOUS | Status: AC
  Administered 2015-08-02: 740 mg via INTRAVENOUS
  Filled 2015-08-02: qty 37

## 2015-08-02 NOTE — Telephone Encounter (Signed)
Added appt pe rpof...the patient will get new sched in chemo....emailed Dr. Benay Spice to confirm appt time on pof

## 2015-08-02 NOTE — Progress Notes (Signed)
[facility] OFFICE PROGRESS NOTE   Diagnosis: Colon cancer  INTERVAL HISTORY:   Mr. Rabideau returns as scheduled. He has an improved energy level and appetite compared to when he was here last week. He underwent placement of a Port-A-Cath yesterday. He has attended a chemotherapy teaching class. He is having bowel movements. The blood sugar has not been markedly elevated since discontinuing the diabetic regimen.  Objective:  Vital signs in last 24 hours:  Blood pressure 113/71, pulse 101, temperature 98 F (36.7 C), temperature source Oral, resp. rate 18, height 6' 3"  (1.905 m), SpO2 100 %.    HEENT: Mild scleral icterus Resp: Lungs clear bilaterally Cardio: Regular rate and rhythm GI: The liver is palpable in the mid upper abdomen Vascular: Trace to 1+ pitting edema at the low leg bilaterally     Portacath/PICC-without erythema  Lab Results:  Lab Results  Component Value Date   WBC 15.2* 08/01/2015   HGB 9.9* 08/01/2015   HCT 34.5* 08/01/2015   MCV 89.1 08/01/2015   PLT 393 08/01/2015   NEUTROABS 11.6* 07/28/2015     Lab Results  Component Value Date   CEA 8250.0* 07/14/2015    Imaging:  Ir Fluoro Guide Cv Line Right  08/01/2015   CLINICAL DATA:  Metastatic colon cancer to the liver. Access for chemotherapy.  EXAM: RIGHT INTERNAL JUGULAR SINGLE LUMEN POWER PORT CATHETER INSERTION  Date:  8/1/20168/12/2014 10:44 am  Radiologist:  M. Daryll Brod, MD  Guidance:  ULTRASOUND AND FLUOROSCOPIC  FLUOROSCOPY TIME:  60 SECONDS, 36.3 MGY  MEDICATIONS AND MEDICAL HISTORY: 2 G Ancefadministered within 1 hour of the procedure.Versed and fentanyl for conscious sedation, 4 mg Zofran  ANESTHESIA/SEDATION: 30 minutes  CONTRAST:  None  COMPLICATIONS: None immediate  PROCEDURE: Informed consent was obtained from the patient following explanation of the procedure, risks, benefits and alternatives. The patient understands, agrees and consents for the procedure. All  questions were addressed. A time out was performed.  Maximal barrier sterile technique utilized including caps, mask, sterile gowns, sterile gloves, large sterile drape, hand hygiene, and 2% chlorhexidine scrub.  Under sterile conditions and local anesthesia, right internal jugular micropuncture venous access was performed. Access was performed with ultrasound. Images were obtained for documentation. A guide wire was inserted followed by a transitional dilator. This allowed insertion of a guide wire and catheter into the IVC. Measurements were obtained from the SVC / RA junction back to the right IJ venotomy site. In the right infraclavicular chest, a subcutaneous pocket was created over the second anterior rib. This was done under sterile conditions and local anesthesia. 1% lidocaine with epinephrine was utilized for this. A 2.5 cm incision was made in the skin. Blunt dissection was performed to create a subcutaneous pocket over the right pectoralis major muscle. The pocket was flushed with saline vigorously. There was adequate hemostasis. The port catheter was assembled and checked for leakage. The port catheter was secured in the pocket with two retention sutures. The tubing was tunneled subcutaneously to the right venotomy site and inserted into the SVC/RA junction through a valved peel-away sheath. Position was confirmed with fluoroscopy. Images were obtained for documentation. The patient tolerated the procedure well. No immediate complications. Incisions were closed in a two layer fashion with 4 - 0 Vicryl suture. Dermabond was applied to the skin. The port catheter was accessed, blood was aspirated followed by saline and heparin flushes. Needle was removed. A dry sterile dressing was applied.  IMPRESSION: Ultrasound and fluoroscopically guided  right internal jugular single lumen power port catheter insertion. Tip in the SVC/RA junction. Catheter ready for use.   Electronically Signed   By: Jerilynn Mages.  Shick M.D.    On: 08/01/2015 11:09   Ir US Guide Vasc Access Right  08/01/2015   CLINICAL DATA:  Metastatic colon cancer to the liver. Access for chemotherapy.  EXAM: RIGHT INTERNAL JUGULAR SINGLE LUMEN POWER PORT CATHETER INSERTION  Date:  8/1/20168/12/2014 10:44 am  Radiologist:  M. Daryll Brod, MD  Guidance:  ULTRASOUND AND FLUOROSCOPIC  FLUOROSCOPY TIME:  60 SECONDS, 36.3 MGY  MEDICATIONS AND MEDICAL HISTORY: 2 G Ancefadministered within 1 hour of the procedure.Versed and fentanyl for conscious sedation, 4 mg Zofran  ANESTHESIA/SEDATION: 30 minutes  CONTRAST:  None  COMPLICATIONS: None immediate  PROCEDURE: Informed consent was obtained from the patient following explanation of the procedure, risks, benefits and alternatives. The patient understands, agrees and consents for the procedure. All questions were addressed. A time out was performed.  Maximal barrier sterile technique utilized including caps, mask, sterile gowns, sterile gloves, large sterile drape, hand hygiene, and 2% chlorhexidine scrub.  Under sterile conditions and local anesthesia, right internal jugular micropuncture venous access was performed. Access was performed with ultrasound. Images were obtained for documentation. A guide wire was inserted followed by a transitional dilator. This allowed insertion of a guide wire and catheter into the IVC. Measurements were obtained from the SVC / RA junction back to the right IJ venotomy site. In the right infraclavicular chest, a subcutaneous pocket was created over the second anterior rib. This was done under sterile conditions and local anesthesia. 1% lidocaine with epinephrine was utilized for this. A 2.5 cm incision was made in the skin. Blunt dissection was performed to create a subcutaneous pocket over the right pectoralis major muscle. The pocket was flushed with saline vigorously. There was adequate hemostasis. The port catheter was assembled and checked for leakage. The port catheter was secured in the  pocket with two retention sutures. The tubing was tunneled subcutaneously to the right venotomy site and inserted into the SVC/RA junction through a valved peel-away sheath. Position was confirmed with fluoroscopy. Images were obtained for documentation. The patient tolerated the procedure well. No immediate complications. Incisions were closed in a two layer fashion with 4 - 0 Vicryl suture. Dermabond was applied to the skin. The port catheter was accessed, blood was aspirated followed by saline and heparin flushes. Needle was removed. A dry sterile dressing was applied.  IMPRESSION: Ultrasound and fluoroscopically guided right internal jugular single lumen power port catheter insertion. Tip in the SVC/RA junction. Catheter ready for use.   Electronically Signed   By: Jerilynn Mages.  Shick M.D.   On: 08/01/2015 11:09    Medications: I have reviewed the patient's current medications.  Assessment/Plan: 1. 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 Obstructive jaundice, likely secondary to porta hepatis lymphadenopathy, status post placement of a bile duct stent on 07/14/2015  3. Microcytic anemia secondary to the colon mass  4. Diabetes  5. Failure to thrive secondary to metastatic colon cancer  6.   Port-A-Cath placement 08/01/2015    Disposition:  Jaime [name] has metastatic colon cancer. The jaundice has improved. The plan is to begin FOLFOX chemotherapy today. He understands the increased risk of toxicity with the liver dysfunction. He agrees  to proceed. We will consider adding bevacizumab after 1 or 2 cycles of chemotherapy. We will also consider an EGFR inhibitor pending the K-ras testing.  The 5-FU and oxaliplatin were dose reduced with cycle 1 anticipating the  increased risk of toxicity.  Mr. Clowers will return for an office visit and cycle 2 FOLFOX in 2 weeks. He will contact us in the interim for new symptoms.  Betsy Coder, MD  08/02/2015  8:52 AM

## 2015-08-02 NOTE — Patient Instructions (Signed)
[facility] Discharge Instructions for Patients Receiving Chemotherapy  Today you received the following chemotherapy agents: Oxaliplatin. Leucovorin, Adrucil   To help prevent nausea and vomiting after your treatment, we encourage you to take your nausea medication as directed.   Zofran: Take 1 tablet (8 mg total) by mouth every 8 hours as needed for nausea or vomiting Compazine: Take 1 tablet (10 mg total) by mouth every 6 hours as needed for nausea or vomiting.   If you develop nausea and vomiting that is not controlled by your nausea medication, call the clinic.   BELOW ARE SYMPTOMS THAT SHOULD BE REPORTED IMMEDIATELY:  *FEVER GREATER THAN 100.5 F  *CHILLS WITH OR WITHOUT FEVER  NAUSEA AND VOMITING THAT IS NOT CONTROLLED WITH YOUR NAUSEA MEDICATION  *UNUSUAL SHORTNESS OF BREATH  *UNUSUAL BRUISING OR BLEEDING  TENDERNESS IN MOUTH AND THROAT WITH OR WITHOUT PRESENCE OF ULCERS  *URINARY PROBLEMS  *BOWEL PROBLEMS  UNUSUAL RASH Items with * indicate a potential emergency and should be followed up as soon as possible.  Feel free to call the clinic you have any questions or concerns. The clinic phone number is [phone].  Please show the [facility] at check-in to the Emergency Department and triage nurse.   Oxaliplatin Injection What is this medicine? OXALIPLATIN (ox AL i PLA tin) is a chemotherapy drug. It targets fast dividing cells, like cancer cells, and causes these cells to die. This medicine is used to treat cancers of the colon and rectum, and many other cancers. This medicine may be used for other purposes; ask your health care provider or pharmacist if you have questions. COMMON BRAND NAME(S): Eloxatin What should I tell my health care provider before I take this medicine? They need to know if you have any of these conditions: -kidney disease -an unusual or allergic reaction to oxaliplatin, other chemotherapy, other medicines, foods, dyes,  or preservatives -pregnant or trying to get pregnant -breast-feeding How should I use this medicine? This drug is given as an infusion into a vein. It is administered in a hospital or clinic by a specially trained health care professional. Talk to your pediatrician regarding the use of this medicine in children. Special care may be needed. Overdosage: If you think you have taken too much of this medicine contact a poison control center or emergency room at once. NOTE: This medicine is only for you. Do not share this medicine with others. What if I miss a dose? It is important not to miss a dose. Call your doctor or health care professional if you are unable to keep an appointment. What may interact with this medicine? -medicines to increase blood counts like filgrastim, pegfilgrastim, sargramostim -probenecid -some antibiotics like amikacin, gentamicin, neomycin, polymyxin B, streptomycin, tobramycin -zalcitabine Talk to your doctor or health care professional before taking any of these medicines: -acetaminophen -aspirin -ibuprofen -ketoprofen -naproxen This list may not describe all possible interactions. Give your health care provider a list of all the medicines, herbs, non-prescription drugs, or dietary supplements you use. Also tell them if you smoke, drink alcohol, or use illegal drugs. Some items may interact with your medicine. What should I watch for while using this medicine? Your condition will be monitored carefully while you are receiving this medicine. You will need important blood work done while you are taking this medicine. This medicine can make you more sensitive to cold. Do not drink cold drinks or use ice. Cover exposed skin before coming in contact with  cold temperatures or cold objects. When out in cold weather wear warm clothing and cover your mouth and nose to warm the air that goes into your lungs. Tell your doctor if you get sensitive to the cold. This drug may make  you feel generally unwell. This is not uncommon, as chemotherapy can affect healthy cells as well as cancer cells. Report any side effects. Continue your course of treatment even though you feel ill unless your doctor tells you to stop. In some cases, you may be given additional medicines to help with side effects. Follow all directions for their use. Call your doctor or health care professional for advice if you get a fever, chills or sore throat, or other symptoms of a cold or flu. Do not treat yourself. This drug decreases your body's ability to fight infections. Try to avoid being around people who are sick. This medicine may increase your risk to bruise or bleed. Call your doctor or health care professional if you notice any unusual bleeding. Be careful brushing and flossing your teeth or using a toothpick because you may get an infection or bleed more easily. If you have any dental work done, tell your dentist you are receiving this medicine. Avoid taking products that contain aspirin, acetaminophen, ibuprofen, naproxen, or ketoprofen unless instructed by your doctor. These medicines may hide a fever. Do not become pregnant while taking this medicine. Women should inform their doctor if they wish to become pregnant or think they might be pregnant. There is a potential for serious side effects to an unborn child. Talk to your health care professional or pharmacist for more information. Do not breast-feed an infant while taking this medicine. Call your doctor or health care professional if you get diarrhea. Do not treat yourself. What side effects may I notice from receiving this medicine? Side effects that you should report to your doctor or health care professional as soon as possible: -allergic reactions like skin rash, itching or hives, swelling of the face, lips, or tongue -low blood counts - This drug may decrease the number of white blood cells, red blood cells and platelets. You may be at  increased risk for infections and bleeding. -signs of infection - fever or chills, cough, sore throat, pain or difficulty passing urine -signs of decreased platelets or bleeding - bruising, pinpoint red spots on the skin, black, tarry stools, nosebleeds -signs of decreased red blood cells - unusually weak or tired, fainting spells, lightheadedness -breathing problems -chest pain, pressure -cough -diarrhea -jaw tightness -mouth sores -nausea and vomiting -pain, swelling, redness or irritation at the injection site -pain, tingling, numbness in the hands or feet -problems with balance, talking, walking -redness, blistering, peeling or loosening of the skin, including inside the mouth -trouble passing urine or change in the amount of urine Side effects that usually do not require medical attention (report to your doctor or health care professional if they continue or are bothersome): -changes in vision -constipation -hair loss -loss of appetite -metallic taste in the mouth or changes in taste -stomach pain This list may not describe all possible side effects. Call your doctor for medical advice about side effects. You may report side effects to FDA at 1-800-FDA-1088. Where should I keep my medicine? This drug is given in a hospital or clinic and will not be stored at home. NOTE: This sheet is a summary. It may not cover all possible information. If you have questions about this medicine, talk to your doctor, pharmacist, or health  care provider.  2015, Elsevier/Gold Standard. (2008-07-13 17:22:47)   Leucovorin injection What is this medicine? LEUCOVORIN (loo koe VOR in) is used to prevent or treat the harmful effects of some medicines. This medicine is used to treat anemia caused by a low amount of folic acid in the body. It is also used with 5-fluorouracil (5-FU) to treat colon cancer. This medicine may be used for other purposes; ask your health care provider or pharmacist if you have  questions. What should I tell my health care provider before I take this medicine? They need to know if you have any of these conditions: -anemia from low levels of vitamin B-12 in the blood -an unusual or allergic reaction to leucovorin, folic acid, other medicines, foods, dyes, or preservatives -pregnant or trying to get pregnant -breast-feeding How should I use this medicine? This medicine is for injection into a muscle or into a vein. It is given by a health care professional in a hospital or clinic setting. Talk to your pediatrician regarding the use of this medicine in children. Special care may be needed. Overdosage: If you think you have taken too much of this medicine contact a poison control center or emergency room at once. NOTE: This medicine is only for you. Do not share this medicine with others. What if I miss a dose? This does not apply. What may interact with this medicine? -capecitabine -fluorouracil -phenobarbital -phenytoin -primidone -trimethoprim-sulfamethoxazole This list may not describe all possible interactions. Give your health care provider a list of all the medicines, herbs, non-prescription drugs, or dietary supplements you use. Also tell them if you smoke, drink alcohol, or use illegal drugs. Some items may interact with your medicine. What should I watch for while using this medicine? Your condition will be monitored carefully while you are receiving this medicine. This medicine may increase the side effects of 5-fluorouracil, 5-FU. Tell your doctor or health care professional if you have diarrhea or mouth sores that do not get better or that get worse. What side effects may I notice from receiving this medicine? Side effects that you should report to your doctor or health care professional as soon as possible: -allergic reactions like skin rash, itching or hives, swelling of the face, lips, or tongue -breathing problems -fever, infection -mouth  sores -unusual bleeding or bruising -unusually weak or tired Side effects that usually do not require medical attention (report to your doctor or health care professional if they continue or are bothersome): -constipation or diarrhea -loss of appetite -nausea, vomiting This list may not describe all possible side effects. Call your doctor for medical advice about side effects. You may report side effects to FDA at 1-800-FDA-1088. Where should I keep my medicine? This drug is given in a hospital or clinic and will not be stored at home. NOTE: This sheet is a summary. It may not cover all possible information. If you have questions about this medicine, talk to your doctor, pharmacist, or health care provider.  2015, Elsevier/Gold Standard. (2008-06-22 16:50:29)    Fluorouracil, 5-FU injection What is this medicine? FLUOROURACIL, 5-FU (flure oh YOOR a sil) is a chemotherapy drug. It slows the growth of cancer cells. This medicine is used to treat many types of cancer like breast cancer, colon or rectal cancer, pancreatic cancer, and stomach cancer. This medicine may be used for other purposes; ask your health care provider or pharmacist if you have questions. COMMON BRAND NAME(S): Adrucil What should I tell my health care provider before I  take this medicine? They need to know if you have any of these conditions: -blood disorders -dihydropyrimidine dehydrogenase (DPD) deficiency -infection (especially a virus infection such as chickenpox, cold sores, or herpes) -kidney disease -liver disease -malnourished, poor nutrition -recent or ongoing radiation therapy -an unusual or allergic reaction to fluorouracil, other chemotherapy, other medicines, foods, dyes, or preservatives -pregnant or trying to get pregnant -breast-feeding How should I use this medicine? This drug is given as an infusion or injection into a vein. It is administered in a hospital or clinic by a specially trained health  care professional. Talk to your pediatrician regarding the use of this medicine in children. Special care may be needed. Overdosage: If you think you have taken too much of this medicine contact a poison control center or emergency room at once. NOTE: This medicine is only for you. Do not share this medicine with others. What if I miss a dose? It is important not to miss your dose. Call your doctor or health care professional if you are unable to keep an appointment. What may interact with this medicine? -allopurinol -cimetidine -dapsone -digoxin -hydroxyurea -leucovorin -levamisole -medicines for seizures like ethotoin, fosphenytoin, phenytoin -medicines to increase blood counts like filgrastim, pegfilgrastim, sargramostim -medicines that treat or prevent blood clots like warfarin, enoxaparin, and dalteparin -methotrexate -metronidazole -pyrimethamine -some other chemotherapy drugs like busulfan, cisplatin, estramustine, vinblastine -trimethoprim -trimetrexate -vaccines Talk to your doctor or health care professional before taking any of these medicines: -acetaminophen -aspirin -ibuprofen -ketoprofen -naproxen This list may not describe all possible interactions. Give your health care provider a list of all the medicines, herbs, non-prescription drugs, or dietary supplements you use. Also tell them if you smoke, drink alcohol, or use illegal drugs. Some items may interact with your medicine. What should I watch for while using this medicine? Visit your doctor for checks on your progress. This drug may make you feel generally unwell. This is not uncommon, as chemotherapy can affect healthy cells as well as cancer cells. Report any side effects. Continue your course of treatment even though you feel ill unless your doctor tells you to stop. In some cases, you may be given additional medicines to help with side effects. Follow all directions for their use. Call your doctor or health  care professional for advice if you get a fever, chills or sore throat, or other symptoms of a cold or flu. Do not treat yourself. This drug decreases your body's ability to fight infections. Try to avoid being around people who are sick. This medicine may increase your risk to bruise or bleed. Call your doctor or health care professional if you notice any unusual bleeding. Be careful brushing and flossing your teeth or using a toothpick because you may get an infection or bleed more easily. If you have any dental work done, tell your dentist you are receiving this medicine. Avoid taking products that contain aspirin, acetaminophen, ibuprofen, naproxen, or ketoprofen unless instructed by your doctor. These medicines may hide a fever. Do not become pregnant while taking this medicine. Women should inform their doctor if they wish to become pregnant or think they might be pregnant. There is a potential for serious side effects to an unborn child. Talk to your health care professional or pharmacist for more information. Do not breast-feed an infant while taking this medicine. Men should inform their doctor if they wish to father a child. This medicine may lower sperm counts. Do not treat diarrhea with over the counter products. Contact your  doctor if you have diarrhea that lasts more than 2 days or if it is severe and watery. This medicine can make you more sensitive to the sun. Keep out of the sun. If you cannot avoid being in the sun, wear protective clothing and use sunscreen. Do not use sun lamps or tanning beds/booths. What side effects may I notice from receiving this medicine? Side effects that you should report to your doctor or health care professional as soon as possible: -allergic reactions like skin rash, itching or hives, swelling of the face, lips, or tongue -low blood counts - this medicine may decrease the number of white blood cells, red blood cells and platelets. You may be at increased  risk for infections and bleeding. -signs of infection - fever or chills, cough, sore throat, pain or difficulty passing urine -signs of decreased platelets or bleeding - bruising, pinpoint red spots on the skin, black, tarry stools, blood in the urine -signs of decreased red blood cells - unusually weak or tired, fainting spells, lightheadedness -breathing problems -changes in vision -chest pain -mouth sores -nausea and vomiting -pain, swelling, redness at site where injected -pain, tingling, numbness in the hands or feet -redness, swelling, or sores on hands or feet -stomach pain -unusual bleeding Side effects that usually do not require medical attention (report to your doctor or health care professional if they continue or are bothersome): -changes in finger or toe nails -diarrhea -dry or itchy skin -hair loss -headache -loss of appetite -sensitivity of eyes to the light -stomach upset -unusually teary eyes This list may not describe all possible side effects. Call your doctor for medical advice about side effects. You may report side effects to FDA at 1-800-FDA-1088. Where should I keep my medicine? This drug is given in a hospital or clinic and will not be stored at home. NOTE: This sheet is a summary. It may not cover all possible information. If you have questions about this medicine, talk to your doctor, pharmacist, or health care provider.  2015, Elsevier/Gold Standard. (2008-04-21 13:53:16)

## 2015-08-02 NOTE — Progress Notes (Signed)
59 year old male diagnosed with metastatic colon cancer.  He is a patient of Dr. Julieanne Manson.  Past medical history includes diabetes, hypertension, and hyperlipidemia.  Medications include iron, Zofran, and Compazine.  Labs include glucose 147, albumin 1.7 and total bilirubin 4.85.  Height: 6 feet 3 inches. Weight: 253.2 pounds July 26. Usual body weight: 265 pounds July 18. BMI: 31.65.  Patient is receiving first FOLFOX today. Patient reports occasional nausea but states nausea medication helps.   He has occasional constipation. M.D. has discontinued patient's diabetic medications.  Nutrition diagnosis:  Food and nutrition related knowledge deficit related to new diagnosis of colon cancer and associated treatments as evidenced by no prior need for nutrition related information.  Intervention:  Educated patient to consume small frequent meals with adequate calories and high protein foods to maintain lean body mass. Educated patient on importance of taking nausea medications as prescribed by physician. Educated patient on strategies for avoiding/improving constipation. Provided fact sheets on increasing calories and protein, nausea, vomiting, and constipation. Questions were answered and teach back method was used.  I provided patient with my contact information.  Monitoring, evaluation, goals: Patient will tolerate adequate calories and protein to minimize loss of lean body mass.  Next visit: To be scheduled as needed.  **Disclaimer: This note was dictated with voice recognition software. Similar sounding words can inadvertently be transcribed and this note may contain transcription errors which may not have been corrected upon publication of note.**

## 2015-08-03 ENCOUNTER — Encounter: Payer: Self-pay | Admitting: Oncology

## 2015-08-03 ENCOUNTER — Telehealth: Payer: Self-pay | Admitting: *Deleted

## 2015-08-03 ENCOUNTER — Encounter: Payer: Managed Care, Other (non HMO) | Admitting: Internal Medicine

## 2015-08-03 NOTE — Telephone Encounter (Signed)
Left message on voicemail for pt to call office to review side effect management.

## 2015-08-03 NOTE — Telephone Encounter (Signed)
Faxed prescription for lift chair to [facility] with request they review insurance for coverage.

## 2015-08-03 NOTE — Progress Notes (Signed)
I placed [employer] forms on desk of nurse for dr. Benay Spice

## 2015-08-04 ENCOUNTER — Ambulatory Visit (HOSPITAL_BASED_OUTPATIENT_CLINIC_OR_DEPARTMENT_OTHER): Payer: Managed Care, Other (non HMO)

## 2015-08-04 ENCOUNTER — Encounter: Payer: Self-pay | Admitting: Oncology

## 2015-08-04 ENCOUNTER — Encounter: Payer: Self-pay | Admitting: *Deleted

## 2015-08-04 DIAGNOSIS — Z452 Encounter for adjustment and management of vascular access device: Secondary | ICD-10-CM | POA: Diagnosis not present

## 2015-08-04 DIAGNOSIS — C187 Malignant neoplasm of sigmoid colon: Secondary | ICD-10-CM

## 2015-08-04 DIAGNOSIS — C189 Malignant neoplasm of colon, unspecified: Secondary | ICD-10-CM

## 2015-08-04 MED ORDER — HEPARIN SOD (PORK) LOCK FLUSH 100 UNIT/ML IV SOLN
500.0000 [IU] | Freq: Once | INTRAVENOUS | Status: AC | PRN
  Administered 2015-08-04: 500 [IU]
  Filled 2015-08-04: qty 5

## 2015-08-04 MED ORDER — SODIUM CHLORIDE 0.9 % IJ SOLN
10.0000 mL | INTRAMUSCULAR | Status: DC | PRN
  Filled 2015-08-04: qty 10

## 2015-08-04 NOTE — Progress Notes (Signed)
Oncology Nurse Navigator Documentation  Oncology Nurse Navigator Flowsheets 08/04/2015  Referral date to RadOnc/MedOnc -  Navigator Encounter Type 1 week F/U  Patient Visit Type Medonc  Treatment Phase Treatment #1-pump d/c  Barriers/Navigation Needs Family concerns-Disability Forms & FMLA forms  Education Reviewed antiemetic regimen  Interventions Called [facility] and left message to follow up on PT referral from last week. They will have Referral Support Team return call  Referrals -  Support Groups/Services -  Time Spent with Patient 30  Confirmed with managed care that forms will be ready on Monday and she will call wife when they are ready. They have not heard from PT yet. Wife feels he is doing better-taking laps around the house several times/day. Eating better. Insurance company does not cover lift chair. Left message for CSW to contact wife when possible.

## 2015-08-04 NOTE — Patient Instructions (Signed)

## 2015-08-04 NOTE — Progress Notes (Signed)
I faxed forms to [employer]  [phone] and will mail copy to patient/wife.

## 2015-08-05 ENCOUNTER — Telehealth: Payer: Self-pay | Admitting: *Deleted

## 2015-08-05 NOTE — Telephone Encounter (Signed)
Called from [facility] that they have no record of the referral placed in EPIC on 07/26/15. Requests order be faxed to [phone]. Faxed as requested w/office note.

## 2015-08-08 ENCOUNTER — Encounter: Payer: Self-pay | Admitting: Oncology

## 2015-08-08 NOTE — Progress Notes (Signed)
I called and left message for call back from patient or wife to advise wife's fmla forms are ready. No fax on forms to fax, so I will make copy and put in mail to them today.

## 2015-08-09 ENCOUNTER — Encounter: Payer: Self-pay | Admitting: Oncology

## 2015-08-09 NOTE — Progress Notes (Signed)
Wife pamela left mess for call bck. I called [phone] and left her a message if she has a fax# for me to fax her fmla forms let me know. I did advise copy was put in mail to them 08/08/15

## 2015-08-10 ENCOUNTER — Encounter: Payer: Self-pay | Admitting: Oncology

## 2015-08-10 ENCOUNTER — Telehealth: Payer: Self-pay | Admitting: *Deleted

## 2015-08-10 ENCOUNTER — Encounter (HOSPITAL_COMMUNITY): Payer: Self-pay

## 2015-08-10 NOTE — Telephone Encounter (Signed)
Oncology Nurse Navigator Documentation  Oncology Nurse Navigator Flowsheets 08/10/2015  Referral date to RadOnc/MedOnc -  Navigator Encounter Type Telephone-1 week f/u  Patient Visit Type -  Treatment Phase Treatment s/p  #1 FOLFOX  Barriers/Navigation Needs -  Education -  Interventions -  Referrals -  Support Groups/Services -  Time Spent with Patient -  Left VM for patient to call back with his current status-eating,activity. Has he heard from PT yet?

## 2015-08-10 NOTE — Progress Notes (Signed)
I spoke with wife and she said they got the forms and already faxed them.

## 2015-08-14 ENCOUNTER — Other Ambulatory Visit: Payer: Self-pay | Admitting: Oncology

## 2015-08-16 ENCOUNTER — Encounter: Payer: Self-pay | Admitting: *Deleted

## 2015-08-16 ENCOUNTER — Other Ambulatory Visit (HOSPITAL_BASED_OUTPATIENT_CLINIC_OR_DEPARTMENT_OTHER): Payer: Managed Care, Other (non HMO)

## 2015-08-16 ENCOUNTER — Ambulatory Visit (HOSPITAL_BASED_OUTPATIENT_CLINIC_OR_DEPARTMENT_OTHER): Payer: Managed Care, Other (non HMO)

## 2015-08-16 ENCOUNTER — Telehealth: Payer: Self-pay | Admitting: *Deleted

## 2015-08-16 ENCOUNTER — Other Ambulatory Visit: Payer: Self-pay | Admitting: *Deleted

## 2015-08-16 ENCOUNTER — Ambulatory Visit: Payer: Managed Care, Other (non HMO) | Admitting: Nutrition

## 2015-08-16 ENCOUNTER — Telehealth: Payer: Self-pay | Admitting: Oncology

## 2015-08-16 ENCOUNTER — Ambulatory Visit (HOSPITAL_BASED_OUTPATIENT_CLINIC_OR_DEPARTMENT_OTHER): Payer: Managed Care, Other (non HMO) | Admitting: Oncology

## 2015-08-16 VITALS — BP 114/60 | HR 98 | Temp 97.8°F | Resp 18 | Ht 75.0 in

## 2015-08-16 DIAGNOSIS — C189 Malignant neoplasm of colon, unspecified: Secondary | ICD-10-CM

## 2015-08-16 DIAGNOSIS — Z5111 Encounter for antineoplastic chemotherapy: Secondary | ICD-10-CM

## 2015-08-16 DIAGNOSIS — C187 Malignant neoplasm of sigmoid colon: Secondary | ICD-10-CM

## 2015-08-16 DIAGNOSIS — D509 Iron deficiency anemia, unspecified: Secondary | ICD-10-CM

## 2015-08-16 DIAGNOSIS — E119 Type 2 diabetes mellitus without complications: Secondary | ICD-10-CM

## 2015-08-16 DIAGNOSIS — C787 Secondary malignant neoplasm of liver and intrahepatic bile duct: Secondary | ICD-10-CM

## 2015-08-16 DIAGNOSIS — J90 Pleural effusion, not elsewhere classified: Secondary | ICD-10-CM | POA: Diagnosis not present

## 2015-08-16 LAB — COMPREHENSIVE METABOLIC PANEL (CC13)
ALT: 16 U/L (ref 0–55)
AST: 32 U/L (ref 5–34)
Albumin: 2.1 g/dL — ABNORMAL LOW (ref 3.5–5.0)
Alkaline Phosphatase: 456 U/L — ABNORMAL HIGH (ref 40–150)
Anion Gap: 11 mEq/L (ref 3–11)
BILIRUBIN TOTAL: 3.37 mg/dL — AB (ref 0.20–1.20)
BUN: 12.4 mg/dL (ref 7.0–26.0)
CALCIUM: 8.9 mg/dL (ref 8.4–10.4)
CO2: 28 mEq/L (ref 22–29)
CREATININE: 0.9 mg/dL (ref 0.7–1.3)
Chloride: 104 mEq/L (ref 98–109)
Glucose: 153 mg/dl — ABNORMAL HIGH (ref 70–140)
Potassium: 3.1 mEq/L — ABNORMAL LOW (ref 3.5–5.1)
Sodium: 143 mEq/L (ref 136–145)
TOTAL PROTEIN: 7 g/dL (ref 6.4–8.3)

## 2015-08-16 LAB — CBC WITH DIFFERENTIAL/PLATELET
BASO%: 0.5 % (ref 0.0–2.0)
Basophils Absolute: 0.1 10*3/uL (ref 0.0–0.1)
EOS ABS: 0.1 10*3/uL (ref 0.0–0.5)
EOS%: 0.8 % (ref 0.0–7.0)
HEMATOCRIT: 36.7 % — AB (ref 38.4–49.9)
HGB: 11.4 g/dL — ABNORMAL LOW (ref 13.0–17.1)
LYMPH%: 4.4 % — AB (ref 14.0–49.0)
MCH: 26.8 pg — ABNORMAL LOW (ref 27.2–33.4)
MCHC: 31 g/dL — AB (ref 32.0–36.0)
MCV: 86.5 fL (ref 79.3–98.0)
MONO#: 0.9 10*3/uL (ref 0.1–0.9)
MONO%: 9 % (ref 0.0–14.0)
NEUT%: 85.3 % — AB (ref 39.0–75.0)
NEUTROS ABS: 8.2 10*3/uL — AB (ref 1.5–6.5)
PLATELETS: 277 10*3/uL (ref 140–400)
RBC: 4.24 10*6/uL (ref 4.20–5.82)
RDW: 21.9 % — ABNORMAL HIGH (ref 11.0–14.6)
WBC: 9.6 10*3/uL (ref 4.0–10.3)
lymph#: 0.4 10*3/uL — ABNORMAL LOW (ref 0.9–3.3)

## 2015-08-16 MED ORDER — HEPARIN SOD (PORK) LOCK FLUSH 100 UNIT/ML IV SOLN
500.0000 [IU] | Freq: Once | INTRAVENOUS | Status: DC | PRN
  Filled 2015-08-16: qty 5

## 2015-08-16 MED ORDER — DEXTROSE 5 % IV SOLN
Freq: Once | INTRAVENOUS | Status: AC
  Administered 2015-08-16: 11:00:00 via INTRAVENOUS

## 2015-08-16 MED ORDER — PALONOSETRON HCL INJECTION 0.25 MG/5ML
0.2500 mg | Freq: Once | INTRAVENOUS | Status: AC
  Administered 2015-08-16: 0.25 mg via INTRAVENOUS

## 2015-08-16 MED ORDER — SODIUM CHLORIDE 0.9 % IV SOLN
10.0000 mg | Freq: Once | INTRAVENOUS | Status: AC
  Administered 2015-08-16: 10 mg via INTRAVENOUS
  Filled 2015-08-16: qty 1

## 2015-08-16 MED ORDER — PALONOSETRON HCL INJECTION 0.25 MG/5ML
INTRAVENOUS | Status: AC
  Filled 2015-08-16: qty 5

## 2015-08-16 MED ORDER — POTASSIUM CHLORIDE 20 MEQ PO PACK: 20.0000 meq | PACK | Freq: Every day | ORAL | Status: DC

## 2015-08-16 MED ORDER — FLUOROURACIL CHEMO INJECTION 5 GM/100ML
1800.0000 mg/m2 | INTRAVENOUS | Status: DC
  Administered 2015-08-16: 4450 mg via INTRAVENOUS
  Filled 2015-08-16: qty 89

## 2015-08-16 MED ORDER — OXALIPLATIN CHEMO INJECTION 100 MG/20ML
65.0000 mg/m2 | Freq: Once | INTRAVENOUS | Status: AC
  Administered 2015-08-16: 160 mg via INTRAVENOUS
  Filled 2015-08-16: qty 32

## 2015-08-16 MED ORDER — LEUCOVORIN CALCIUM INJECTION 350 MG
299.5000 mg/m2 | Freq: Once | INTRAVENOUS | Status: AC
  Administered 2015-08-16: 740 mg via INTRAVENOUS
  Filled 2015-08-16: qty 37

## 2015-08-16 MED ORDER — SODIUM CHLORIDE 0.9 % IJ SOLN
10.0000 mL | INTRAMUSCULAR | Status: DC | PRN
  Filled 2015-08-16: qty 10

## 2015-08-16 NOTE — Progress Notes (Signed)
Per Dr. Benay Spice, okay to tx w/ today's labs

## 2015-08-16 NOTE — Telephone Encounter (Signed)
Labs/ov added per 08/16 POF, sent msg to add chemo/pump D/C, also sent msg to MD confirming pt to see ML on 09/27 due to no MD availability. Will have pt get an updated schedule in infusion room.... KJ

## 2015-08-16 NOTE — Progress Notes (Signed)
  [facility] OFFICE PROGRESS NOTE   Diagnosis: Colon cancer  INTERVAL HISTORY:   Jaime [name] completed a first cycle of FOLFOX 08/02/2015. He reports tolerating the chemotherapy well. No mouth sores or diarrhea. Nausea improved with Zofran. He continues to have difficulty standing from a low sitting position, but once standing he is ambulatory. His overall activity level has improved over the past few weeks. Leg swelling and jaundice have improved.  Objective:  Vital signs in last 24 hours:  Blood pressure 114/60, pulse 98, temperature 97.8 F (36.6 C), temperature source Oral, resp. rate 18, height 6' 3" (1.905 m), SpO2 100 %.    HEENT: Scleral icterus, no thrush or ulcers Resp: Lungs clear bilaterally Cardio: Regular rate and rhythm GI: The liver edge is palpable in the upper abdomen to the left of midline Vascular: Pitting edema below the knee bilaterally  Skin: Jaundice   Portacath/PICC-without erythema  Lab Results:  Lab Results  Component Value Date   WBC 9.6 08/16/2015   HGB 11.4* 08/16/2015   HCT 36.7* 08/16/2015   MCV 86.5 08/16/2015   PLT 277 08/16/2015   NEUTROABS 8.2* 08/16/2015   Potassium 3.1, creatinine 0.9, alk phosphatase 456, abdomen 2.1, bilirubin 3.37  Lab Results  Component Value Date   CEA 8250.0* 07/14/2015     Medications: I have reviewed the patient's current medications.  Assessment/Plan: 1. 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 Obstructive jaundice, likely secondary to porta hepatis lymphadenopathy, status post placement of a  bile duct stent on 07/14/2015  3. Microcytic anemia secondary to the colon mass-improved  4. Diabetes  5. Failure to thrive secondary to metastatic colon cancer  6. Port-A-Cath placement 08/01/2015  7.   Nausea following cycle 1 FOLFOX-Aloxi added with cycle 2    Disposition:  Jaime [name] tolerated the first cycle of FOLFOX well. His performance status appears improved. He will complete cycle 2 FOLFOX today. He will return for an office visit and chemotherapy in 2 weeks. He will begin a potassium supplement.  We will consider adding Avastin and dose escalating the 5-FU/oxaliplatin when he returns for chemotherapy in 2 weeks.  Betsy Coder, MD  08/16/2015  10:25 AM

## 2015-08-16 NOTE — Progress Notes (Signed)
Brief follow-up completed with patient and wife, during chemotherapy for metastatic colon cancer. Patient was requesting samples of clear liquid nutrition supplements. Wife reports blood sugars have been controlled under 150 There is no recent weight documented. Labs documented: Glucose 153, potassium 3.1, albumin 2.1.  Nutrition diagnosis: Food and nutrition related knowledge deficit continues.  Intervention:  Provided samples of clear liquid nutrition supplements. Educated patient to consume these with low carbohydrate foods since they typically are higher in carbohydrates and may cause increase in blood sugars. Patient suggested he may drink half supplement at a time, thereby reducing total carbohydrates. Educated patient on Engineer, maintenance (IT). Patient denies additional needs at this time. Questions were answered and teach back method used.  Monitoring, evaluation, goals: Patient will work to continue to tolerate adequate calories and protein. Will monitor weight.  Next visit: Tuesday, August 30, during infusion.  **Disclaimer: This note was dictated with voice recognition software. Similar sounding words can inadvertently be transcribed and this note may contain transcription errors which may not have been corrected upon publication of note.**

## 2015-08-16 NOTE — Progress Notes (Signed)
[place] Work  Clinical Social Work was referred by Environmental manager for assessment of psychosocial needs.  Clinical Social Worker met with patient and patients wife in the infusion room at [facility] to offer support and assess for needs.  Patient and patient's wife stated they were doing "better" and had no immediate concerns.  CSW offered support and information on GI support group and the support team at [facility].  CSW, patient, and patients wife also discussed the importance of support and common feelings and emotions when going through treatment.  CSw encouraged patient and wife to call with any questions or concerns.       Johnnye Lana, MSW, LCSW, OSW-C Clinical Social Worker [facility] [phone]

## 2015-08-16 NOTE — Telephone Encounter (Signed)
Per staff message and POF I have scheduled appts. Advised scheduler of appts. JMW  

## 2015-08-16 NOTE — Patient Instructions (Signed)
[facility] Discharge Instructions for Patients Receiving Chemotherapy  Today you received the following chemotherapy agents: Oxaliplatin. Leucovorin, Adrucil   To help prevent nausea and vomiting after your treatment, we encourage you to take your nausea medication as directed.   Zofran: Take 1 tablet (8 mg total) by mouth every 8 hours as needed for nausea or vomiting Compazine: Take 1 tablet (10 mg total) by mouth every 6 hours as needed for nausea or vomiting.   If you develop nausea and vomiting that is not controlled by your nausea medication, call the clinic.   BELOW ARE SYMPTOMS THAT SHOULD BE REPORTED IMMEDIATELY:  *FEVER GREATER THAN 100.5 F  *CHILLS WITH OR WITHOUT FEVER  NAUSEA AND VOMITING THAT IS NOT CONTROLLED WITH YOUR NAUSEA MEDICATION  *UNUSUAL SHORTNESS OF BREATH  *UNUSUAL BRUISING OR BLEEDING  TENDERNESS IN MOUTH AND THROAT WITH OR WITHOUT PRESENCE OF ULCERS  *URINARY PROBLEMS  *BOWEL PROBLEMS  UNUSUAL RASH Items with * indicate a potential emergency and should be followed up as soon as possible.  Feel free to call the clinic you have any questions or concerns. The clinic phone number is [phone].  Please show the [place] at check-in to the Emergency Department and triage nurse.   Oxaliplatin Injection What is this medicine? OXALIPLATIN (ox AL i PLA tin) is a chemotherapy drug. It targets fast dividing cells, like cancer cells, and causes these cells to die. This medicine is used to treat cancers of the colon and rectum, and many other cancers. This medicine may be used for other purposes; ask your health care provider or pharmacist if you have questions. COMMON BRAND NAME(S): Eloxatin What should I tell my health care provider before I take this medicine? They need to know if you have any of these conditions: -kidney disease -an unusual or allergic reaction to oxaliplatin, other chemotherapy, other medicines, foods, dyes,  or preservatives -pregnant or trying to get pregnant -breast-feeding How should I use this medicine? This drug is given as an infusion into a vein. It is administered in a hospital or clinic by a specially trained health care professional. Talk to your pediatrician regarding the use of this medicine in children. Special care may be needed. Overdosage: If you think you have taken too much of this medicine contact a poison control center or emergency room at once. NOTE: This medicine is only for you. Do not share this medicine with others. What if I miss a dose? It is important not to miss a dose. Call your doctor or health care professional if you are unable to keep an appointment. What may interact with this medicine? -medicines to increase blood counts like filgrastim, pegfilgrastim, sargramostim -probenecid -some antibiotics like amikacin, gentamicin, neomycin, polymyxin B, streptomycin, tobramycin -zalcitabine Talk to your doctor or health care professional before taking any of these medicines: -acetaminophen -aspirin -ibuprofen -ketoprofen -naproxen This list may not describe all possible interactions. Give your health care provider a list of all the medicines, herbs, non-prescription drugs, or dietary supplements you use. Also tell them if you smoke, drink alcohol, or use illegal drugs. Some items may interact with your medicine. What should I watch for while using this medicine? Your condition will be monitored carefully while you are receiving this medicine. You will need important blood work done while you are taking this medicine. This medicine can make you more sensitive to cold. Do not drink cold drinks or use ice. Cover exposed skin before coming in contact with  cold temperatures or cold objects. When out in cold weather wear warm clothing and cover your mouth and nose to warm the air that goes into your lungs. Tell your doctor if you get sensitive to the cold. This drug may make  you feel generally unwell. This is not uncommon, as chemotherapy can affect healthy cells as well as cancer cells. Report any side effects. Continue your course of treatment even though you feel ill unless your doctor tells you to stop. In some cases, you may be given additional medicines to help with side effects. Follow all directions for their use. Call your doctor or health care professional for advice if you get a fever, chills or sore throat, or other symptoms of a cold or flu. Do not treat yourself. This drug decreases your body's ability to fight infections. Try to avoid being around people who are sick. This medicine may increase your risk to bruise or bleed. Call your doctor or health care professional if you notice any unusual bleeding. Be careful brushing and flossing your teeth or using a toothpick because you may get an infection or bleed more easily. If you have any dental work done, tell your dentist you are receiving this medicine. Avoid taking products that contain aspirin, acetaminophen, ibuprofen, naproxen, or ketoprofen unless instructed by your doctor. These medicines may hide a fever. Do not become pregnant while taking this medicine. Women should inform their doctor if they wish to become pregnant or think they might be pregnant. There is a potential for serious side effects to an unborn child. Talk to your health care professional or pharmacist for more information. Do not breast-feed an infant while taking this medicine. Call your doctor or health care professional if you get diarrhea. Do not treat yourself. What side effects may I notice from receiving this medicine? Side effects that you should report to your doctor or health care professional as soon as possible: -allergic reactions like skin rash, itching or hives, swelling of the face, lips, or tongue -low blood counts - This drug may decrease the number of white blood cells, red blood cells and platelets. You may be at  increased risk for infections and bleeding. -signs of infection - fever or chills, cough, sore throat, pain or difficulty passing urine -signs of decreased platelets or bleeding - bruising, pinpoint red spots on the skin, black, tarry stools, nosebleeds -signs of decreased red blood cells - unusually weak or tired, fainting spells, lightheadedness -breathing problems -chest pain, pressure -cough -diarrhea -jaw tightness -mouth sores -nausea and vomiting -pain, swelling, redness or irritation at the injection site -pain, tingling, numbness in the hands or feet -problems with balance, talking, walking -redness, blistering, peeling or loosening of the skin, including inside the mouth -trouble passing urine or change in the amount of urine Side effects that usually do not require medical attention (report to your doctor or health care professional if they continue or are bothersome): -changes in vision -constipation -hair loss -loss of appetite -metallic taste in the mouth or changes in taste -stomach pain This list may not describe all possible side effects. Call your doctor for medical advice about side effects. You may report side effects to FDA at 1-800-FDA-1088. Where should I keep my medicine? This drug is given in a hospital or clinic and will not be stored at home. NOTE: This sheet is a summary. It may not cover all possible information. If you have questions about this medicine, talk to your doctor, pharmacist, or health  care provider.  2015, Elsevier/Gold Standard. (2008-07-13 17:22:47)   Leucovorin injection What is this medicine? LEUCOVORIN (loo koe VOR in) is used to prevent or treat the harmful effects of some medicines. This medicine is used to treat anemia caused by a low amount of folic acid in the body. It is also used with 5-fluorouracil (5-FU) to treat colon cancer. This medicine may be used for other purposes; ask your health care provider or pharmacist if you have  questions. What should I tell my health care provider before I take this medicine? They need to know if you have any of these conditions: -anemia from low levels of vitamin B-12 in the blood -an unusual or allergic reaction to leucovorin, folic acid, other medicines, foods, dyes, or preservatives -pregnant or trying to get pregnant -breast-feeding How should I use this medicine? This medicine is for injection into a muscle or into a vein. It is given by a health care professional in a hospital or clinic setting. Talk to your pediatrician regarding the use of this medicine in children. Special care may be needed. Overdosage: If you think you have taken too much of this medicine contact a poison control center or emergency room at once. NOTE: This medicine is only for you. Do not share this medicine with others. What if I miss a dose? This does not apply. What may interact with this medicine? -capecitabine -fluorouracil -phenobarbital -phenytoin -primidone -trimethoprim-sulfamethoxazole This list may not describe all possible interactions. Give your health care provider a list of all the medicines, herbs, non-prescription drugs, or dietary supplements you use. Also tell them if you smoke, drink alcohol, or use illegal drugs. Some items may interact with your medicine. What should I watch for while using this medicine? Your condition will be monitored carefully while you are receiving this medicine. This medicine may increase the side effects of 5-fluorouracil, 5-FU. Tell your doctor or health care professional if you have diarrhea or mouth sores that do not get better or that get worse. What side effects may I notice from receiving this medicine? Side effects that you should report to your doctor or health care professional as soon as possible: -allergic reactions like skin rash, itching or hives, swelling of the face, lips, or tongue -breathing problems -fever, infection -mouth  sores -unusual bleeding or bruising -unusually weak or tired Side effects that usually do not require medical attention (report to your doctor or health care professional if they continue or are bothersome): -constipation or diarrhea -loss of appetite -nausea, vomiting This list may not describe all possible side effects. Call your doctor for medical advice about side effects. You may report side effects to FDA at 1-800-FDA-1088. Where should I keep my medicine? This drug is given in a hospital or clinic and will not be stored at home. NOTE: This sheet is a summary. It may not cover all possible information. If you have questions about this medicine, talk to your doctor, pharmacist, or health care provider.  2015, Elsevier/Gold Standard. (2008-06-22 16:50:29)    Fluorouracil, 5-FU injection What is this medicine? FLUOROURACIL, 5-FU (flure oh YOOR a sil) is a chemotherapy drug. It slows the growth of cancer cells. This medicine is used to treat many types of cancer like breast cancer, colon or rectal cancer, pancreatic cancer, and stomach cancer. This medicine may be used for other purposes; ask your health care provider or pharmacist if you have questions. COMMON BRAND NAME(S): Adrucil What should I tell my health care provider before I  take this medicine? They need to know if you have any of these conditions: -blood disorders -dihydropyrimidine dehydrogenase (DPD) deficiency -infection (especially a virus infection such as chickenpox, cold sores, or herpes) -kidney disease -liver disease -malnourished, poor nutrition -recent or ongoing radiation therapy -an unusual or allergic reaction to fluorouracil, other chemotherapy, other medicines, foods, dyes, or preservatives -pregnant or trying to get pregnant -breast-feeding How should I use this medicine? This drug is given as an infusion or injection into a vein. It is administered in a hospital or clinic by a specially trained health  care professional. Talk to your pediatrician regarding the use of this medicine in children. Special care may be needed. Overdosage: If you think you have taken too much of this medicine contact a poison control center or emergency room at once. NOTE: This medicine is only for you. Do not share this medicine with others. What if I miss a dose? It is important not to miss your dose. Call your doctor or health care professional if you are unable to keep an appointment. What may interact with this medicine? -allopurinol -cimetidine -dapsone -digoxin -hydroxyurea -leucovorin -levamisole -medicines for seizures like ethotoin, fosphenytoin, phenytoin -medicines to increase blood counts like filgrastim, pegfilgrastim, sargramostim -medicines that treat or prevent blood clots like warfarin, enoxaparin, and dalteparin -methotrexate -metronidazole -pyrimethamine -some other chemotherapy drugs like busulfan, cisplatin, estramustine, vinblastine -trimethoprim -trimetrexate -vaccines Talk to your doctor or health care professional before taking any of these medicines: -acetaminophen -aspirin -ibuprofen -ketoprofen -naproxen This list may not describe all possible interactions. Give your health care provider a list of all the medicines, herbs, non-prescription drugs, or dietary supplements you use. Also tell them if you smoke, drink alcohol, or use illegal drugs. Some items may interact with your medicine. What should I watch for while using this medicine? Visit your doctor for checks on your progress. This drug may make you feel generally unwell. This is not uncommon, as chemotherapy can affect healthy cells as well as cancer cells. Report any side effects. Continue your course of treatment even though you feel ill unless your doctor tells you to stop. In some cases, you may be given additional medicines to help with side effects. Follow all directions for their use. Call your doctor or health  care professional for advice if you get a fever, chills or sore throat, or other symptoms of a cold or flu. Do not treat yourself. This drug decreases your body's ability to fight infections. Try to avoid being around people who are sick. This medicine may increase your risk to bruise or bleed. Call your doctor or health care professional if you notice any unusual bleeding. Be careful brushing and flossing your teeth or using a toothpick because you may get an infection or bleed more easily. If you have any dental work done, tell your dentist you are receiving this medicine. Avoid taking products that contain aspirin, acetaminophen, ibuprofen, naproxen, or ketoprofen unless instructed by your doctor. These medicines may hide a fever. Do not become pregnant while taking this medicine. Women should inform their doctor if they wish to become pregnant or think they might be pregnant. There is a potential for serious side effects to an unborn child. Talk to your health care professional or pharmacist for more information. Do not breast-feed an infant while taking this medicine. Men should inform their doctor if they wish to father a child. This medicine may lower sperm counts. Do not treat diarrhea with over the counter products. Contact your  doctor if you have diarrhea that lasts more than 2 days or if it is severe and watery. This medicine can make you more sensitive to the sun. Keep out of the sun. If you cannot avoid being in the sun, wear protective clothing and use sunscreen. Do not use sun lamps or tanning beds/booths. What side effects may I notice from receiving this medicine? Side effects that you should report to your doctor or health care professional as soon as possible: -allergic reactions like skin rash, itching or hives, swelling of the face, lips, or tongue -low blood counts - this medicine may decrease the number of white blood cells, red blood cells and platelets. You may be at increased  risk for infections and bleeding. -signs of infection - fever or chills, cough, sore throat, pain or difficulty passing urine -signs of decreased platelets or bleeding - bruising, pinpoint red spots on the skin, black, tarry stools, blood in the urine -signs of decreased red blood cells - unusually weak or tired, fainting spells, lightheadedness -breathing problems -changes in vision -chest pain -mouth sores -nausea and vomiting -pain, swelling, redness at site where injected -pain, tingling, numbness in the hands or feet -redness, swelling, or sores on hands or feet -stomach pain -unusual bleeding Side effects that usually do not require medical attention (report to your doctor or health care professional if they continue or are bothersome): -changes in finger or toe nails -diarrhea -dry or itchy skin -hair loss -headache -loss of appetite -sensitivity of eyes to the light -stomach upset -unusually teary eyes This list may not describe all possible side effects. Call your doctor for medical advice about side effects. You may report side effects to FDA at 1-800-FDA-1088. Where should I keep my medicine? This drug is given in a hospital or clinic and will not be stored at home. NOTE: This sheet is a summary. It may not cover all possible information. If you have questions about this medicine, talk to your doctor, pharmacist, or health care provider.  2015, Elsevier/Gold Standard. (2008-04-21 13:53:16)

## 2015-08-16 NOTE — Progress Notes (Signed)
Oncology Nurse Navigator Documentation  Oncology Nurse Navigator Flowsheets 08/16/2015  Referral date to RadOnc/MedOnc -  Navigator Encounter Type 1 month F/U  Patient Visit Type Medonc  Treatment Phase Treatment #2   Barriers/Navigation Needs No barriers at this time---wife seems more relaxed;patient looks stronger; did well with chemo; friend is giving them a lift chair they no longer use  Education Potassium; Fall Precautions  Interventions Education Method-handout on high potassium foods/liquids; discussed prior PT referral-he wants to hold off for now.  Referrals -  Education Method Written;Verbal  Support Groups/Services -  Time Spent with Patient 30

## 2015-08-17 ENCOUNTER — Telehealth: Payer: Self-pay | Admitting: *Deleted

## 2015-08-17 NOTE — Telephone Encounter (Signed)
Received call from pt's wife, Jeani Hawking, who stated pt had tx yesterday and is now having diarrhea. I asked if he is able to eat and drink, and she stated yes. Assessed if pt has taken imodium for diarrhea, she stated no. I instructed her to have pt take 1 imodium OTC after each loose stool, no more than 8 tabs daily. She voices understanding. Knows to call us if there is another change in pt's condition.

## 2015-08-18 ENCOUNTER — Ambulatory Visit (HOSPITAL_BASED_OUTPATIENT_CLINIC_OR_DEPARTMENT_OTHER): Payer: Managed Care, Other (non HMO)

## 2015-08-18 VITALS — HR 103 | Temp 98.3°F | Resp 20

## 2015-08-18 DIAGNOSIS — C187 Malignant neoplasm of sigmoid colon: Secondary | ICD-10-CM | POA: Diagnosis not present

## 2015-08-18 DIAGNOSIS — Z452 Encounter for adjustment and management of vascular access device: Secondary | ICD-10-CM | POA: Diagnosis not present

## 2015-08-18 DIAGNOSIS — C189 Malignant neoplasm of colon, unspecified: Secondary | ICD-10-CM

## 2015-08-18 MED ORDER — HEPARIN SOD (PORK) LOCK FLUSH 100 UNIT/ML IV SOLN
500.0000 [IU] | Freq: Once | INTRAVENOUS | Status: AC | PRN
  Administered 2015-08-18: 500 [IU]
  Filled 2015-08-18: qty 5

## 2015-08-18 MED ORDER — SODIUM CHLORIDE 0.9 % IJ SOLN
10.0000 mL | INTRAMUSCULAR | Status: DC | PRN
  Administered 2015-08-18: 10 mL
  Filled 2015-08-18: qty 10

## 2015-08-22 ENCOUNTER — Encounter: Payer: Managed Care, Other (non HMO) | Admitting: Internal Medicine

## 2015-08-27 ENCOUNTER — Other Ambulatory Visit: Payer: Self-pay | Admitting: Oncology

## 2015-08-30 ENCOUNTER — Encounter: Payer: Self-pay | Admitting: *Deleted

## 2015-08-30 ENCOUNTER — Ambulatory Visit (HOSPITAL_BASED_OUTPATIENT_CLINIC_OR_DEPARTMENT_OTHER): Payer: Managed Care, Other (non HMO) | Admitting: Oncology

## 2015-08-30 ENCOUNTER — Other Ambulatory Visit (HOSPITAL_BASED_OUTPATIENT_CLINIC_OR_DEPARTMENT_OTHER): Payer: Managed Care, Other (non HMO)

## 2015-08-30 ENCOUNTER — Ambulatory Visit (HOSPITAL_BASED_OUTPATIENT_CLINIC_OR_DEPARTMENT_OTHER): Payer: Managed Care, Other (non HMO)

## 2015-08-30 ENCOUNTER — Ambulatory Visit: Payer: Managed Care, Other (non HMO) | Admitting: Nutrition

## 2015-08-30 VITALS — BP 170/97 | HR 79

## 2015-08-30 VITALS — BP 131/69 | HR 97 | Temp 98.0°F | Resp 18 | Ht 75.0 in | Wt 247.4 lb

## 2015-08-30 DIAGNOSIS — C187 Malignant neoplasm of sigmoid colon: Secondary | ICD-10-CM

## 2015-08-30 DIAGNOSIS — D509 Iron deficiency anemia, unspecified: Secondary | ICD-10-CM

## 2015-08-30 DIAGNOSIS — E119 Type 2 diabetes mellitus without complications: Secondary | ICD-10-CM

## 2015-08-30 DIAGNOSIS — C189 Malignant neoplasm of colon, unspecified: Secondary | ICD-10-CM

## 2015-08-30 DIAGNOSIS — Z5111 Encounter for antineoplastic chemotherapy: Secondary | ICD-10-CM

## 2015-08-30 DIAGNOSIS — Z5112 Encounter for antineoplastic immunotherapy: Secondary | ICD-10-CM

## 2015-08-30 DIAGNOSIS — C787 Secondary malignant neoplasm of liver and intrahepatic bile duct: Secondary | ICD-10-CM

## 2015-08-30 LAB — CBC WITH DIFFERENTIAL/PLATELET
BASO%: 0.1 % (ref 0.0–2.0)
BASOS ABS: 0 10*3/uL (ref 0.0–0.1)
EOS%: 0.7 % (ref 0.0–7.0)
Eosinophils Absolute: 0.1 10*3/uL (ref 0.0–0.5)
HEMATOCRIT: 39.1 % (ref 38.4–49.9)
HEMOGLOBIN: 11.6 g/dL — AB (ref 13.0–17.1)
LYMPH#: 1.1 10*3/uL (ref 0.9–3.3)
LYMPH%: 10 % — ABNORMAL LOW (ref 14.0–49.0)
MCH: 26.8 pg — AB (ref 27.2–33.4)
MCHC: 29.7 g/dL — ABNORMAL LOW (ref 32.0–36.0)
MCV: 90.3 fL (ref 79.3–98.0)
MONO#: 1.2 10*3/uL — ABNORMAL HIGH (ref 0.1–0.9)
MONO%: 10.8 % (ref 0.0–14.0)
NEUT%: 78.4 % — ABNORMAL HIGH (ref 39.0–75.0)
NEUTROS ABS: 8.4 10*3/uL — AB (ref 1.5–6.5)
Platelets: 172 10*3/uL (ref 140–400)
RBC: 4.33 10*6/uL (ref 4.20–5.82)
RDW: 18.4 % — AB (ref 11.0–14.6)
WBC: 10.7 10*3/uL — AB (ref 4.0–10.3)

## 2015-08-30 LAB — COMPREHENSIVE METABOLIC PANEL (CC13)
ALBUMIN: 2.1 g/dL — AB (ref 3.5–5.0)
ALT: 13 U/L (ref 0–55)
AST: 30 U/L (ref 5–34)
Alkaline Phosphatase: 385 U/L — ABNORMAL HIGH (ref 40–150)
Anion Gap: 10 mEq/L (ref 3–11)
BUN: 13.4 mg/dL (ref 7.0–26.0)
CALCIUM: 8.9 mg/dL (ref 8.4–10.4)
CO2: 28 meq/L (ref 22–29)
Chloride: 105 mEq/L (ref 98–109)
Creatinine: 0.8 mg/dL (ref 0.7–1.3)
EGFR: >90 mL/min/{1.73_m2} (ref >90)
Glucose: 150 mg/dl — ABNORMAL HIGH (ref 70–140)
Potassium: 3.7 mEq/L (ref 3.5–5.1)
SODIUM: 143 meq/L (ref 136–145)
TOTAL PROTEIN: 6.8 g/dL (ref 6.4–8.3)
Total Bilirubin: 2.33 mg/dL — ABNORMAL HIGH (ref 0.20–1.20)

## 2015-08-30 LAB — UA PROTEIN, DIPSTICK - CHCC: Protein, ur: 30 mg/dL

## 2015-08-30 MED ORDER — PALONOSETRON HCL INJECTION 0.25 MG/5ML
0.2500 mg | Freq: Once | INTRAVENOUS | Status: AC
  Administered 2015-08-30: 0.25 mg via INTRAVENOUS

## 2015-08-30 MED ORDER — SODIUM CHLORIDE 0.9 % IV SOLN
5.0000 mg/kg | Freq: Once | INTRAVENOUS | Status: AC
  Administered 2015-08-30: 550 mg via INTRAVENOUS
  Filled 2015-08-30: qty 22

## 2015-08-30 MED ORDER — DEXTROSE 5 % IV SOLN
Freq: Once | INTRAVENOUS | Status: AC
  Administered 2015-08-30: 10:00:00 via INTRAVENOUS

## 2015-08-30 MED ORDER — LEUCOVORIN CALCIUM INJECTION 350 MG
300.0000 mg/m2 | Freq: Once | INTRAVENOUS | Status: AC
  Administered 2015-08-30: 742 mg via INTRAVENOUS
  Filled 2015-08-30: qty 37.1

## 2015-08-30 MED ORDER — FLUOROURACIL CHEMO INJECTION 2.5 GM/50ML
300.0000 mg/m2 | Freq: Once | INTRAVENOUS | Status: AC
  Administered 2015-08-30: 750 mg via INTRAVENOUS
  Filled 2015-08-30: qty 15

## 2015-08-30 MED ORDER — OXALIPLATIN CHEMO INJECTION 100 MG/20ML
65.0000 mg/m2 | Freq: Once | INTRAVENOUS | Status: AC
  Administered 2015-08-30: 160 mg via INTRAVENOUS
  Filled 2015-08-30: qty 32

## 2015-08-30 MED ORDER — SODIUM CHLORIDE 0.9 % IV SOLN
2000.0000 mg/m2 | INTRAVENOUS | Status: DC
  Administered 2015-08-30: 4950 mg via INTRAVENOUS
  Filled 2015-08-30: qty 99

## 2015-08-30 MED ORDER — PALONOSETRON HCL INJECTION 0.25 MG/5ML
INTRAVENOUS | Status: AC
  Filled 2015-08-30: qty 5

## 2015-08-30 MED ORDER — SODIUM CHLORIDE 0.9 % IV SOLN
Freq: Once | INTRAVENOUS | Status: AC
  Administered 2015-08-30: 11:00:00 via INTRAVENOUS
  Filled 2015-08-30: qty 5

## 2015-08-30 NOTE — Patient Instructions (Signed)
[facility] Discharge Instructions for Patients Receiving Chemotherapy  Today you received the following chemotherapy agents AVASTIN, OXALIPLATIN, LEUCOVORIN, 5FU PUSH AND 5FU PUMP  To help prevent nausea and vomiting after your treatment, we encourage you to take your nausea medication COMPAZINE as needed. May start using [facility] 8/33 if needed.   If you develop nausea and vomiting that is not controlled by your nausea medication, call the clinic.   BELOW ARE SYMPTOMS THAT SHOULD BE REPORTED IMMEDIATELY:  *FEVER GREATER THAN 100.5 F  *CHILLS WITH OR WITHOUT FEVER  NAUSEA AND VOMITING THAT IS NOT CONTROLLED WITH YOUR NAUSEA MEDICATION  *UNUSUAL SHORTNESS OF BREATH  *UNUSUAL BRUISING OR BLEEDING  TENDERNESS IN MOUTH AND THROAT WITH OR WITHOUT PRESENCE OF ULCERS  *URINARY PROBLEMS  *BOWEL PROBLEMS  UNUSUAL RASH Items with * indicate a potential emergency and should be followed up as soon as possible.  Feel free to call the clinic you have any questions or concerns. The clinic phone number is [phone].  Please show the [place] at check-in to the Emergency Department and triage nurse.

## 2015-08-30 NOTE — Progress Notes (Signed)
  Jaime [name] OFFICE PROGRESS NOTE   Diagnosis:  Colon cancer  INTERVAL HISTORY:   Jaime [name] returns as scheduled. He completed another cycle of FOLFOX 08/16/2015. He had nausea and a few episodes of emesis between days 4 and 6 following chemotherapy. He reports mild diarrhea and took Imodium. His appetite has improved. No neuropathy symptoms. He stays at home most of the time and is ambulatory in the home. He continues to have difficulty getting up from a sitting position. He now has a lift chair. His appetite has improved.  Objective:  Vital signs in last 24 hours:  Blood pressure 131/69, pulse 97, temperature 98 F (36.7 C), temperature source Oral, resp. rate 18, height 6\' 3"  (1.905 m), SpO2 100 %.    HEENT: Mild scleral icterus, no thrush or ulcers Resp: Lungs clear bilaterally Cardio: Regular rate and rhythm GI: Fullness in the right upper abdomen without a discrete liver edge, nontender Vascular: Pitting edema throughout both legs on the left greater than right Neurologic: 3/5 strength with flexion at the hips    Portacath/PICC-without erythema  Lab Results:  Lab Results  Component Value Date   WBC 10.7* 08/30/2015   HGB 11.6* 08/30/2015   HCT 39.1 08/30/2015   MCV 90.3 08/30/2015   PLT 172 08/30/2015   NEUTROABS 8.4* 08/30/2015      Medications: I have reviewed the patient's current medications.  Assessment/Plan: 1. 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, status post a colonoscopy 07/21/2015 confirming a sigmoid colon mass  Staging CT scans 07/13/2015 and 07/14/2015 confirmed extensive liver metastases, lung nodules, a right pleural effusion, and porta hepatis lymphadenopathy  Cycle 1 FOLFOX (chemotherapy doses reduced and bolus 5-FU eliminated) 08/02/2015  Cycle 2 FOLFOX (chemotherapy doses reduced and bolus 5-FU eliminated) 08/16/2015  Cycle 3 FOLFOX (Avastin added, bolus 5-FU introduced, and infusional 5-FU dose escalated)  08/30/2015  2. Obstructive jaundice, likely secondary to porta hepatis lymphadenopathy, status post placement of a bile duct stent on 07/14/2015  3. Microcytic anemia secondary to the colon mass-improved  4. Diabetes  5. Failure to thrive secondary to metastatic colon cancer  6. Port-A-Cath placement 08/01/2015  7. Nausea following cycle 1 and cycle 2 FOLFOX-emend added with cycle 3    Disposition:  Jaime [name] has completed 2 cycles of FOLFOX. He has tolerated chemotherapy well. His performance status has improved and the liver enzymes are lower. The plan is to proceed with cycle 3 FOLFOX today. We will increase the 5-FU dose with chemotherapy today.  He plans to begin a physical therapy program for help with the leg weakness.  Avastin will be added with cycle 3 FOLFOX today. We reviewed the potential toxicities associated with Avastin including the chance for an allergic reaction, hypertension, bleeding, thromboembolic disease, bowel perforation, nephrotoxicity, and CNS toxicity. He agrees to proceed.  He will return for an office visit and chemotherapy in 2 weeks. We will follow-up on the CEA from today.  Betsy Coder, MD  08/30/2015  8:42 AM

## 2015-08-30 NOTE — Progress Notes (Signed)
Nutrition follow-up completed with patient during chemotherapy for metastatic colon cancer. Wife reports blood sugars continue to be around 150 Per nurse, patient weighed approximately 247.4 pounds August 30, down from 253.2 pounds July 26. Patient continues to have lower extremity edema. Patient is eating pretty well and has consumed one of the clear liquid nutrition supplements.  Nutrition diagnosis: Food and nutrition related knowledge deficit improved.    Intervention:  Educated patient to continue to consume high-protein diet to help with edema. Encouraged continued control of blood sugars. Teach back method used.  Monitoring, evaluation, goals: Patient will tolerate adequate calories and protein to maintain lean body mass.  Next visit: Tuesday, October 11, during infusion.  **Disclaimer: This note was dictated with voice recognition software. Similar sounding words can inadvertently be transcribed and this note may contain transcription errors which may not have been corrected upon publication of note.**

## 2015-08-30 NOTE — Progress Notes (Signed)
Oncology Nurse Navigator Documentation  Oncology Nurse Navigator Flowsheets 08/30/2015  Referral date to RadOnc/MedOnc -  Navigator Encounter Type Treatment  Patient Visit Type Medonc  Treatment Phase Treatment #3 FOLFOX  Barriers/Navigation Needs No barriers at this time  Education Encouraged increased activity and to initiate home physical therapy-agrees to contact therapist this week.  Interventions -  Referrals -  Education Method -  Support Groups/Services -  Time Spent with Patient 15  Condition improving and he feels better. Wife requesting records to get his disability extended past 09/06/2015. Will provide office note when available.

## 2015-08-30 NOTE — Progress Notes (Signed)
Wife presented papers from [place] that his STD ends on 09/06/15 and need records to extend. Copied office note from today, labs and treatment plan and forwarded to [facility] in managed care. Requested she get this to [facility] and provide copy for wife to keep at home.

## 2015-08-31 ENCOUNTER — Telehealth: Payer: Self-pay | Admitting: *Deleted

## 2015-08-31 ENCOUNTER — Encounter: Payer: Self-pay | Admitting: Oncology

## 2015-08-31 LAB — CEA: CEA: 8717.2 ng/mL — AB (ref 0.0–5.0)

## 2015-08-31 NOTE — Telephone Encounter (Signed)
Called pt to follow up after first Avastin. No answer. Left message requesting he call to discuss side effect management.

## 2015-08-31 NOTE — Progress Notes (Signed)
I faxed office notes/labs to [facility]   [phone] and I will mail copy for patient

## 2015-09-01 ENCOUNTER — Ambulatory Visit (HOSPITAL_BASED_OUTPATIENT_CLINIC_OR_DEPARTMENT_OTHER): Payer: Managed Care, Other (non HMO)

## 2015-09-01 VITALS — BP 127/67 | HR 90 | Temp 97.8°F | Resp 18

## 2015-09-01 DIAGNOSIS — C189 Malignant neoplasm of colon, unspecified: Secondary | ICD-10-CM

## 2015-09-01 DIAGNOSIS — C187 Malignant neoplasm of sigmoid colon: Secondary | ICD-10-CM | POA: Diagnosis not present

## 2015-09-01 DIAGNOSIS — Z452 Encounter for adjustment and management of vascular access device: Secondary | ICD-10-CM

## 2015-09-01 MED ORDER — SODIUM CHLORIDE 0.9 % IJ SOLN
10.0000 mL | INTRAMUSCULAR | Status: DC | PRN
  Administered 2015-09-01: 10 mL
  Filled 2015-09-01: qty 10

## 2015-09-01 MED ORDER — HEPARIN SOD (PORK) LOCK FLUSH 100 UNIT/ML IV SOLN
500.0000 [IU] | Freq: Once | INTRAVENOUS | Status: AC | PRN
  Administered 2015-09-01: 500 [IU]
  Filled 2015-09-01: qty 5

## 2015-09-01 NOTE — Patient Instructions (Signed)
PICC Home Guide A peripherally inserted central catheter (PICC) is a long, thin, flexible tube that is inserted into a vein in the upper arm. It is a form of intravenous (IV) access. It is considered to be a "central" line because the tip of the PICC ends in a large vein in your chest. This large vein is called the superior vena cava (SVC). The PICC tip ends in the SVC because there is a lot of blood flow in the SVC. This allows medicines and IV fluids to be quickly distributed throughout the body. The PICC is inserted using a sterile technique by a specially trained nurse or physician. After the PICC is inserted, a chest X-ray exam is done to be sure it is in the correct place.  A PICC may be placed for different reasons, such as:  To give medicines and liquid nutrition that can only be given through a central line. Examples are:  Certain antibiotic treatments.  Chemotherapy.  Total parenteral nutrition (TPN).  To take frequent blood samples.  To give IV fluids and blood products.  If there is difficulty placing a peripheral intravenous (PIV) catheter. If taken care of properly, a PICC can remain in place for several months. A PICC can also allow a person to go home from the hospital early. Medicine and PICC care can be managed at home by a family member or home health care team. WHAT PROBLEMS CAN HAPPEN WHEN I HAVE A PICC? Problems with a PICC can occasionally occur. These may include the following:  A blood clot (thrombus) forming in or at the tip of the PICC. This can cause the PICC to become clogged. A clot-dissolving medicine called tissue plasminogen activator (tPA) can be given through the PICC to help break up the clot.  Inflammation of the vein (phlebitis) in which the PICC is placed. Signs of inflammation may include redness, pain at the insertion site, red streaks, or being able to feel a "cord" in the vein where the PICC is located.  Infection in the PICC or at the insertion  site. Signs of infection may include fever, chills, redness, swelling, or pus drainage from the PICC insertion site.  PICC movement (malposition). The PICC tip may move from its original position due to excessive physical activity, forceful coughing, sneezing, or vomiting.  A break or cut in the PICC. It is important to not use scissors near the PICC.  Nerve or tendon irritation or injury during PICC insertion. WHAT SHOULD I KEEP IN MIND ABOUT ACTIVITIES WHEN I HAVE A PICC?  You may bend your arm and move it freely. If your PICC is near or at the bend of your elbow, avoid activity with repeated motion at the elbow.  Rest at home for the remainder of the day following PICC line insertion.  Avoid lifting heavy objects as instructed by your health care provider.  Avoid using a crutch with the arm on the same side as your PICC. You may need to use a walker. WHAT SHOULD I KNOW ABOUT MY PICC DRESSING?  Keep your PICC bandage (dressing) clean and dry to prevent infection.  Ask your health care provider when you may shower. Ask your health care provider to teach you how to wrap the PICC when you do take a shower.  Change the PICC dressing as instructed by your health care provider.  Change your PICC dressing if it becomes loose or wet. WHAT SHOULD I KNOW ABOUT PICC CARE?  Check the PICC insertion site   daily for leakage, redness, swelling, or pain.  Do not take a bath, swim, or use hot tubs when you have a PICC. Cover PICC line with clear plastic wrap and tape to keep it dry while showering.  Flush the PICC as directed by your health care provider. Let your health care provider know right away if the PICC is difficult to flush or does not flush. Do not use force to flush the PICC.  Do not use a syringe that is less than 10 mL to flush the PICC.  Never pull or tug on the PICC.  Avoid blood pressure checks on the arm with the PICC.  Keep your PICC identification card with you at all  times.  Do not take the PICC out yourself. Only a trained clinical professional should remove the PICC. SEEK IMMEDIATE MEDICAL CARE IF:  Your PICC is accidentally pulled all the way out. If this happens, cover the insertion site with a bandage or gauze dressing. Do not throw the PICC away. Your health care provider will need to inspect it.  Your PICC was tugged or pulled and has partially come out. Do not  push the PICC back in.  There is any type of drainage, redness, or swelling where the PICC enters the skin.  You cannot flush the PICC, it is difficult to flush, or the PICC leaks around the insertion site when it is flushed.  You hear a "flushing" sound when the PICC is flushed.  You have pain, discomfort, or numbness in your arm, shoulder, or jaw on the same side as the PICC.  You feel your heart "racing" or skipping beats.  You notice a hole or tear in the PICC.  You develop chills or a fever. MAKE SURE YOU:   Understand these instructions.  Will watch your condition.  Will get help right away if you are not doing well or get worse. Document Released: 06/23/2003 Document Revised: 05/03/2014 Document Reviewed: 08/24/2013 Exit[employer] Patient Information 2015 [employer]. This information is not intended to replace advice given to you by your health care provider. Make sure you discuss any questions you have with your health care provider.  

## 2015-09-06 ENCOUNTER — Telehealth: Payer: Self-pay | Admitting: *Deleted

## 2015-09-06 NOTE — Telephone Encounter (Signed)
Per Ned Card NP, okay to apply duoderm and change every 2-3 days, keep area clean/dry; increase protein and fluid intake. Left message on pt vm for callback # with instructions and if any questions or concerns arise.

## 2015-09-06 NOTE — Telephone Encounter (Signed)
PRESSURE AREA IS THE SIZE OF A DIME. IT IS SLIGHTLY RED AND "LOOKS LIKE A BLISTER THAT HAS POPPED BUT NO DRAINAGE. PT. STAYS ON THAT SIDE TO WATCH TV. INSTRUCTED PT.'S WIFE TO KEEP PT. OFF HIS LEFT HIP. PT. NEEDS TO GET UP EVERY TWO HOURS AND WALK AROUND THE HOUSE SEVERAL TIMES. IS THERE ANY KIND OF CREAM OR OINTMENT THAT PT.'S WIFE COULD PUT ON THIS AREA TO PROMOTE HEALING?

## 2015-09-07 ENCOUNTER — Other Ambulatory Visit: Payer: Self-pay | Admitting: Oncology

## 2015-09-07 ENCOUNTER — Telehealth: Payer: Self-pay | Admitting: *Deleted

## 2015-09-07 NOTE — Telephone Encounter (Signed)
Voicemail from [place] with [facility].   "Finally started care for reluctant patient.  Please advise.  He has pressure ulcer to left hip and could benefit from nursing.  Has +4 pitting edema on left greater than right.  Would you like to order compression stockings or a diuretic.  I've told him to elevate legs as much as possible.  Return number [phone]."

## 2015-09-07 NOTE — Telephone Encounter (Signed)
Compression stockings would be ok, though it may be difficult for him to get them on

## 2015-09-08 ENCOUNTER — Telehealth: Payer: Self-pay | Admitting: *Deleted

## 2015-09-08 NOTE — Telephone Encounter (Signed)
PT.'S WIFE WANTS TO TALK ABOUT PT.'S NEED FOR A NURSE AT THE 09/13/15 APPOINTMENT.

## 2015-09-08 NOTE — Telephone Encounter (Signed)
VO from Dr. Benay Spice for Nursing given to [facility] PT on 9/7 and 9/8, left on VM. PT has not returned my call.

## 2015-09-08 NOTE — Telephone Encounter (Signed)
Ruel Favors with Dr. Gearldine Shown instructions for compression stockings.  Erline Levine asked how he is to get these is aware of patient's F/U on 09-13-2015.  We do not fit or provide these as an outpatient facility.  Erline Levine will inquire and call back if her needs a written prescription.

## 2015-09-09 ENCOUNTER — Telehealth: Payer: Self-pay | Admitting: *Deleted

## 2015-09-09 NOTE — Telephone Encounter (Signed)
Received VM from [facility] stating wife is questioning nursing seeing pt and pt refusing nursing until next week, as not to be seen on the same days as nursing. RN also questioning if nursing is needed. I tried reaching RN this morning to explain PT had discovered a stage II pressure ulcer to left hip, and it is imperative pt is assessed and treated. I left direct call back number for return call on VM.

## 2015-09-13 ENCOUNTER — Other Ambulatory Visit (HOSPITAL_BASED_OUTPATIENT_CLINIC_OR_DEPARTMENT_OTHER): Payer: Managed Care, Other (non HMO)

## 2015-09-13 ENCOUNTER — Ambulatory Visit (HOSPITAL_BASED_OUTPATIENT_CLINIC_OR_DEPARTMENT_OTHER): Payer: Managed Care, Other (non HMO) | Admitting: Nurse Practitioner

## 2015-09-13 ENCOUNTER — Ambulatory Visit (HOSPITAL_BASED_OUTPATIENT_CLINIC_OR_DEPARTMENT_OTHER): Payer: Managed Care, Other (non HMO)

## 2015-09-13 VITALS — BP 172/93 | HR 87

## 2015-09-13 VITALS — BP 113/47 | HR 93 | Temp 97.6°F | Resp 18 | Ht 75.0 in | Wt 258.9 lb

## 2015-09-13 DIAGNOSIS — C187 Malignant neoplasm of sigmoid colon: Secondary | ICD-10-CM

## 2015-09-13 DIAGNOSIS — C787 Secondary malignant neoplasm of liver and intrahepatic bile duct: Secondary | ICD-10-CM

## 2015-09-13 DIAGNOSIS — C189 Malignant neoplasm of colon, unspecified: Secondary | ICD-10-CM

## 2015-09-13 DIAGNOSIS — Z5112 Encounter for antineoplastic immunotherapy: Secondary | ICD-10-CM | POA: Diagnosis not present

## 2015-09-13 DIAGNOSIS — D509 Iron deficiency anemia, unspecified: Secondary | ICD-10-CM

## 2015-09-13 DIAGNOSIS — Z5111 Encounter for antineoplastic chemotherapy: Secondary | ICD-10-CM | POA: Diagnosis not present

## 2015-09-13 LAB — COMPREHENSIVE METABOLIC PANEL (CC13)
ALT: 11 U/L (ref 0–55)
ANION GAP: 10 meq/L (ref 3–11)
AST: 25 U/L (ref 5–34)
Albumin: 2.1 g/dL — ABNORMAL LOW (ref 3.5–5.0)
Alkaline Phosphatase: 400 U/L — ABNORMAL HIGH (ref 40–150)
BUN: 13.3 mg/dL (ref 7.0–26.0)
CALCIUM: 8.6 mg/dL (ref 8.4–10.4)
CHLORIDE: 105 meq/L (ref 98–109)
CO2: 25 meq/L (ref 22–29)
Creatinine: 0.7 mg/dL (ref 0.7–1.3)
EGFR: >90 mL/min/{1.73_m2} (ref >90)
Glucose: 145 mg/dl — ABNORMAL HIGH (ref 70–140)
POTASSIUM: 3.8 meq/L (ref 3.5–5.1)
Sodium: 140 mEq/L (ref 136–145)
Total Bilirubin: 1.94 mg/dL — ABNORMAL HIGH (ref 0.20–1.20)
Total Protein: 6.6 g/dL (ref 6.4–8.3)

## 2015-09-13 LAB — CBC WITH DIFFERENTIAL/PLATELET
BASO%: 1.3 % (ref 0.0–2.0)
BASOS ABS: 0 10*3/uL (ref 0.0–0.1)
EOS%: 1.8 % (ref 0.0–7.0)
Eosinophils Absolute: 0.1 10*3/uL (ref 0.0–0.5)
HEMATOCRIT: 36.1 % — AB (ref 38.4–49.9)
HGB: 11.2 g/dL — ABNORMAL LOW (ref 13.0–17.1)
LYMPH#: 0.8 10*3/uL — AB (ref 0.9–3.3)
LYMPH%: 23.7 % (ref 14.0–49.0)
MCH: 26.9 pg — AB (ref 27.2–33.4)
MCHC: 31.1 g/dL — AB (ref 32.0–36.0)
MCV: 86.5 fL (ref 79.3–98.0)
MONO#: 0.7 10*3/uL (ref 0.1–0.9)
MONO%: 21.3 % — ABNORMAL HIGH (ref 0.0–14.0)
NEUT#: 1.7 10*3/uL (ref 1.5–6.5)
NEUT%: 51.9 % (ref 39.0–75.0)
PLATELETS: 192 10*3/uL (ref 140–400)
RBC: 4.18 10*6/uL — AB (ref 4.20–5.82)
RDW: 19.5 % — ABNORMAL HIGH (ref 11.0–14.6)
WBC: 3.4 10*3/uL — ABNORMAL LOW (ref 4.0–10.3)

## 2015-09-13 MED ORDER — PALONOSETRON HCL INJECTION 0.25 MG/5ML
0.2500 mg | Freq: Once | INTRAVENOUS | Status: AC
  Administered 2015-09-13: 0.25 mg via INTRAVENOUS

## 2015-09-13 MED ORDER — PALONOSETRON HCL INJECTION 0.25 MG/5ML
INTRAVENOUS | Status: AC
Start: 2015-09-13 — End: 2015-09-13
  Filled 2015-09-13: qty 5

## 2015-09-13 MED ORDER — SODIUM CHLORIDE 0.9 % IV SOLN
Freq: Once | INTRAVENOUS | Status: AC
  Administered 2015-09-13: 10:00:00 via INTRAVENOUS
  Filled 2015-09-13: qty 5

## 2015-09-13 MED ORDER — LEUCOVORIN CALCIUM INJECTION 350 MG
300.0000 mg/m2 | Freq: Once | INTRAVENOUS | Status: AC
  Administered 2015-09-13: 742 mg via INTRAVENOUS
  Filled 2015-09-13: qty 37.1

## 2015-09-13 MED ORDER — DEXTROSE 5 % IV SOLN
65.0000 mg/m2 | Freq: Once | INTRAVENOUS | Status: AC
  Administered 2015-09-13: 160 mg via INTRAVENOUS
  Filled 2015-09-13: qty 32

## 2015-09-13 MED ORDER — DEXTROSE 5 % IV SOLN
Freq: Once | INTRAVENOUS | Status: AC
  Administered 2015-09-13: 11:00:00 via INTRAVENOUS

## 2015-09-13 MED ORDER — SODIUM CHLORIDE 0.9 % IV SOLN
5.0000 mg/kg | Freq: Once | INTRAVENOUS | Status: AC
  Administered 2015-09-13: 550 mg via INTRAVENOUS
  Filled 2015-09-13: qty 18

## 2015-09-13 MED ORDER — POTASSIUM CHLORIDE 20 MEQ PO PACK: 20.0000 meq | PACK | Freq: Every day | ORAL | Status: DC

## 2015-09-13 MED ORDER — FLUOROURACIL CHEMO INJECTION 2.5 GM/50ML
300.0000 mg/m2 | Freq: Once | INTRAVENOUS | Status: AC
  Administered 2015-09-13: 750 mg via INTRAVENOUS
  Filled 2015-09-13: qty 15

## 2015-09-13 MED ORDER — FLUOROURACIL CHEMO INJECTION 5 GM/100ML
2000.0000 mg/m2 | INTRAVENOUS | Status: DC
Start: 2015-09-13 — End: 2015-09-13
  Administered 2015-09-13: 4950 mg via INTRAVENOUS
  Filled 2015-09-13: qty 99

## 2015-09-13 NOTE — Progress Notes (Signed)
BP elevated to 578'I systolic after treatment, Ned Card, NP notified.  Pt and wife instructed to recheck BP at home and if still elevated in the next 24 hours to let us know.  Pt and wife verbalized understanding.  Other vital signs WNL, pt feeling well. No apparent distress.  Pt discharged via sara steady by NT.

## 2015-09-13 NOTE — Progress Notes (Signed)
  [facility] OFFICE PROGRESS NOTE   Diagnosis:  Colon cancer  INTERVAL HISTORY:   Jaime [name] returns as scheduled. He completed cycle 3 FOLFOX plus Avastin 08/30/2015. He had mild nausea beginning around day 5. The nausea was relieved with his home nausea medications. No mouth sores. He has periodic loose stools. He takes Imodium if needed. He resumes cold after about 5 days with no problem. No numbness or tingling today. He denies abdominal pain. He notes that his appetite is diminished for a few days after treatment. He continues to have leg swelling. He is working with physical therapy.  Objective:  Vital signs in last 24 hours:  Blood pressure 113/47, pulse 93, temperature 97.6 F (36.4 C), temperature source Oral, resp. rate 18, height 6\' 3"  (1.905 m), weight 258 lb 14.4 oz (117.436 kg), SpO2 100 %.    HEENT: No thrush or ulcers. Resp: Lungs clear bilaterally. Cardio: Regular rate and rhythm. GI: Abdomen is nontender. There is fullness throughout the right upper quadrant. Vascular: Pitting edema throughout both legs left greater than right. Port-A-Cath without erythema.  Lab Results:  Lab Results  Component Value Date   WBC 3.4* 09/13/2015   HGB 11.2* 09/13/2015   HCT 36.1* 09/13/2015   MCV 86.5 09/13/2015   PLT 192 09/13/2015   NEUTROABS 1.7 09/13/2015    Imaging:  No results found.  Medications: I have reviewed the patient's current medications.  Assessment/Plan: 1. 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, status post a colonoscopy 07/21/2015 confirming a sigmoid colon mass  Staging CT scans 07/13/2015 and 07/14/2015 confirmed extensive liver metastases, lung nodules, a right pleural effusion, and porta hepatis lymphadenopathy  Cycle 1 FOLFOX (chemotherapy doses reduced and bolus 5-FU eliminated) 08/02/2015  Cycle 2 FOLFOX (chemotherapy doses reduced and bolus 5-FU eliminated) 08/16/2015  Cycle 3 FOLFOX (Avastin added, bolus 5-FU  introduced, and infusional 5-FU dose escalated) 08/30/2015  Cycle 4 FOLFOX plus Avastin 09/13/2015  2. Obstructive jaundice, likely secondary to porta hepatis lymphadenopathy, status post placement of a bile duct stent on 07/14/2015  3. Microcytic anemia secondary to the colon mass-improved  4. Diabetes  5. Failure to thrive secondary to metastatic colon cancer  6. Port-A-Cath placement 08/01/2015  7. Nausea following cycle 1 and cycle 2 FOLFOX-emend added with cycle 3    Disposition: Mr. Jaime [name] appears stable. He has completed 3 cycles of FOLFOX. Avastin was added with cycle 3. Plan to proceed with cycle 4 FOLFOX plus Avastin today as scheduled.  He will return for a follow-up visit and cycle 5 in 2 weeks. He will contact the office in the interim with any problems.    Ned Card ANP/GNP-BC   09/13/2015  9:48 AM

## 2015-09-13 NOTE — Patient Instructions (Signed)
[facility] Discharge Instructions for Patients Receiving Chemotherapy  Today you received the following chemotherapy agents oxaliplatin/leucovorin/fluorouracil  To help prevent nausea and vomiting after your treatment, we encourage you to take your nausea medication as directed If you develop nausea and vomiting that is not controlled by your nausea medication, call the clinic.   BELOW ARE SYMPTOMS THAT SHOULD BE REPORTED IMMEDIATELY:  *FEVER GREATER THAN 100.5 F  *CHILLS WITH OR WITHOUT FEVER  NAUSEA AND VOMITING THAT IS NOT CONTROLLED WITH YOUR NAUSEA MEDICATION  *UNUSUAL SHORTNESS OF BREATH  *UNUSUAL BRUISING OR BLEEDING  TENDERNESS IN MOUTH AND THROAT WITH OR WITHOUT PRESENCE OF ULCERS  *URINARY PROBLEMS  *BOWEL PROBLEMS  UNUSUAL RASH Items with * indicate a potential emergency and should be followed up as soon as possible.  Feel free to call the clinic you have any questions or concerns. The clinic phone number is [phone].  

## 2015-09-14 ENCOUNTER — Telehealth: Payer: Self-pay | Admitting: *Deleted

## 2015-09-14 ENCOUNTER — Telehealth: Payer: Self-pay | Admitting: Nurse Practitioner

## 2015-09-14 NOTE — Telephone Encounter (Signed)
As per Lattie Haw call placed to Mrs. Hoeger to inform her that Mr. Stamper blood pressure is better. Relayed to her that tomorrow when he comes in for pump discontinuation inform the nurse if they can call Lattie Haw to inform her what the blood pressure was and also relayed to her that the prescription for the compression stockings she doesn't think she needs a prescription, but if she does to call and let her know. Mrs. Schlageter verbalized understanding.

## 2015-09-14 NOTE — Telephone Encounter (Signed)
Received VM message from patient's wife to inform Ned Card, NP of pt's BP readings from last and today.  Last night: 142/92 Today (09/14/15): 155/93 and 139/84  She is also requesting a prescription for compression stocking for pt.

## 2015-09-14 NOTE — Telephone Encounter (Signed)
-----   Message from Owens Shark, NP sent at 09/14/2015  1:23 PM EDT ----- Please let Jaime [name] wife noted that I got the blood pressure readings she called in earlier. Blood pressure is better. We will check the blood pressure when he is here tomorrow for the pump discontinuation. Please ask the nurse removing the pump to let us know the blood pressure reading. With regard to the compression stockings I don't think a prescription is needed but let me know if she tells you otherwise.

## 2015-09-15 ENCOUNTER — Ambulatory Visit (HOSPITAL_BASED_OUTPATIENT_CLINIC_OR_DEPARTMENT_OTHER): Payer: Managed Care, Other (non HMO)

## 2015-09-15 VITALS — BP 143/88 | HR 91 | Temp 97.9°F | Resp 16

## 2015-09-15 DIAGNOSIS — C187 Malignant neoplasm of sigmoid colon: Secondary | ICD-10-CM | POA: Diagnosis not present

## 2015-09-15 DIAGNOSIS — Z452 Encounter for adjustment and management of vascular access device: Secondary | ICD-10-CM | POA: Diagnosis not present

## 2015-09-15 DIAGNOSIS — C189 Malignant neoplasm of colon, unspecified: Secondary | ICD-10-CM

## 2015-09-15 MED ORDER — SODIUM CHLORIDE 0.9 % IJ SOLN
10.0000 mL | INTRAMUSCULAR | Status: DC | PRN
  Administered 2015-09-15: 10 mL
  Filled 2015-09-15: qty 10

## 2015-09-15 MED ORDER — HEPARIN SOD (PORK) LOCK FLUSH 100 UNIT/ML IV SOLN
500.0000 [IU] | Freq: Once | INTRAVENOUS | Status: AC | PRN
  Administered 2015-09-15: 500 [IU]
  Filled 2015-09-15: qty 5

## 2015-09-15 NOTE — Patient Instructions (Signed)
[facility] Discharge Instructions for Patients Receiving Chemotherapy  Today you received the following chemotherapy agents fluorouricil  To help prevent nausea and vomiting after your treatment, we encourage you to take your nausea medication    If you develop nausea and vomiting that is not controlled by your nausea medication, call the clinic.   BELOW ARE SYMPTOMS THAT SHOULD BE REPORTED IMMEDIATELY:  *FEVER GREATER THAN 100.5 F  *CHILLS WITH OR WITHOUT FEVER  NAUSEA AND VOMITING THAT IS NOT CONTROLLED WITH YOUR NAUSEA MEDICATION  *UNUSUAL SHORTNESS OF BREATH  *UNUSUAL BRUISING OR BLEEDING  TENDERNESS IN MOUTH AND THROAT WITH OR WITHOUT PRESENCE OF ULCERS  *URINARY PROBLEMS  *BOWEL PROBLEMS  UNUSUAL RASH Items with * indicate a potential emergency and should be followed up as soon as possible.  Feel free to call the clinic you have any questions or concerns. The clinic phone number is [phone].  Please show the CHEMO ALERT CARD at check-in to the Emergency Department and triage nurse.   

## 2015-09-16 ENCOUNTER — Telehealth: Payer: Self-pay | Admitting: *Deleted

## 2015-09-16 NOTE — Telephone Encounter (Signed)
SINCE STAGE TWO ULCER ON PT.'S LEFT HIP IS CLOSED THERE IS NO NEED FOR SKILLED NURSING VISIT.

## 2015-09-23 ENCOUNTER — Other Ambulatory Visit: Payer: Self-pay | Admitting: Oncology

## 2015-09-24 ENCOUNTER — Other Ambulatory Visit: Payer: Self-pay | Admitting: Oncology

## 2015-09-25 ENCOUNTER — Encounter (HOSPITAL_COMMUNITY): Payer: Self-pay | Admitting: Emergency Medicine

## 2015-09-25 ENCOUNTER — Emergency Department (HOSPITAL_COMMUNITY)
Admission: EM | Admit: 2015-09-25 | Discharge: 2015-09-25 | Disposition: A | Discharge status: Home / Self Care | Payer: Managed Care, Other (non HMO) | Attending: Emergency Medicine | Admitting: Emergency Medicine

## 2015-09-25 DIAGNOSIS — R140 Abdominal distension (gaseous): Secondary | ICD-10-CM | POA: Diagnosis not present

## 2015-09-25 DIAGNOSIS — E860 Dehydration: Secondary | ICD-10-CM | POA: Diagnosis not present

## 2015-09-25 DIAGNOSIS — E876 Hypokalemia: Secondary | ICD-10-CM | POA: Diagnosis not present

## 2015-09-25 DIAGNOSIS — R197 Diarrhea, unspecified: Secondary | ICD-10-CM

## 2015-09-25 DIAGNOSIS — I10 Essential (primary) hypertension: Secondary | ICD-10-CM | POA: Insufficient documentation

## 2015-09-25 DIAGNOSIS — Z79899 Other long term (current) drug therapy: Secondary | ICD-10-CM | POA: Diagnosis not present

## 2015-09-25 DIAGNOSIS — E119 Type 2 diabetes mellitus without complications: Secondary | ICD-10-CM | POA: Diagnosis not present

## 2015-09-25 DIAGNOSIS — Z8505 Personal history of malignant neoplasm of liver: Secondary | ICD-10-CM | POA: Insufficient documentation

## 2015-09-25 LAB — CBC
HEMATOCRIT: 32.7 % — AB (ref 39.0–52.0)
HEMOGLOBIN: 10.3 g/dL — AB (ref 13.0–17.0)
MCH: 27.9 pg (ref 26.0–34.0)
MCHC: 31.5 g/dL (ref 30.0–36.0)
MCV: 88.6 fL (ref 78.0–100.0)
Platelets: 160 10*3/uL (ref 150–400)
RBC: 3.69 MIL/uL — ABNORMAL LOW (ref 4.22–5.81)
RDW: 17.7 % — ABNORMAL HIGH (ref 11.5–15.5)
WBC: 4.8 10*3/uL (ref 4.0–10.5)

## 2015-09-25 LAB — COMPREHENSIVE METABOLIC PANEL
ALBUMIN: 2.2 g/dL — AB (ref 3.5–5.0)
ALT: 10 U/L — ABNORMAL LOW (ref 17–63)
ANION GAP: 10 (ref 5–15)
AST: 25 U/L (ref 15–41)
Alkaline Phosphatase: 276 U/L — ABNORMAL HIGH (ref 38–126)
BILIRUBIN TOTAL: 1.7 mg/dL — AB (ref 0.3–1.2)
BUN: 16 mg/dL (ref 6–20)
CO2: 23 mmol/L (ref 22–32)
Calcium: 8.1 mg/dL — ABNORMAL LOW (ref 8.9–10.3)
Chloride: 107 mmol/L (ref 101–111)
Creatinine, Ser: 0.9 mg/dL (ref 0.61–1.24)
Glucose, Bld: 167 mg/dL — ABNORMAL HIGH (ref 65–99)
POTASSIUM: 3.3 mmol/L — AB (ref 3.5–5.1)
Sodium: 140 mmol/L (ref 135–145)
TOTAL PROTEIN: 6.1 g/dL — AB (ref 6.5–8.1)

## 2015-09-25 LAB — LIPASE, BLOOD: Lipase: <10 U/L — ABNORMAL LOW (ref 22–51)

## 2015-09-25 MED ORDER — DIPHENOXYLATE-ATROPINE 2.5-0.025 MG PO TABS: 1.0000 | ORAL_TABLET | Freq: Four times a day (QID) | ORAL | Status: DC | PRN

## 2015-09-25 MED ORDER — DIPHENOXYLATE-ATROPINE 2.5-0.025 MG PO TABS
1.0000 | ORAL_TABLET | Freq: Once | ORAL | Status: AC
  Administered 2015-09-25: 1 via ORAL
  Filled 2015-09-25: qty 1

## 2015-09-25 MED ORDER — SODIUM CHLORIDE 0.9 % IV SOLN
1000.0000 mL | INTRAVENOUS | Status: DC
  Administered 2015-09-25: 1000 mL via INTRAVENOUS

## 2015-09-25 MED ORDER — POTASSIUM CHLORIDE CRYS ER 20 MEQ PO TBCR
40.0000 meq | EXTENDED_RELEASE_TABLET | Freq: Once | ORAL | Status: DC
  Filled 2015-09-25: qty 2

## 2015-09-25 MED ORDER — POTASSIUM CHLORIDE CRYS ER 20 MEQ PO TBCR: 20.0000 meq | EXTENDED_RELEASE_TABLET | Freq: Two times a day (BID) | ORAL | Status: DC

## 2015-09-25 MED ORDER — SODIUM CHLORIDE 0.9 % IV SOLN
1000.0000 mL | Freq: Once | INTRAVENOUS | Status: AC
  Administered 2015-09-25: 1000 mL via INTRAVENOUS

## 2015-09-25 NOTE — ED Notes (Signed)
Per EMS- currently receiving chemotherapy and has intermittent diarrhea between each chemotherapy session. Last chemotherapy session was 2 weeks ago and next chemo treatment is this upcoming Tuesday. Denies N/V. No active vomiting upon arrival. Pt is "worried about dehydration." VS: BP 145/80 HR 90-104 bpm. 20 G in left wrist. SR-ST on monitor. Has chronic bilateral leg swelling.

## 2015-09-25 NOTE — Discharge Instructions (Signed)
Drink plenty of fluids. Avoid milk until the diarrhea is gone. Take the Lomotil as needed for diarrhea. Take the potassium pills until gone. Let Dr. Benay Spice know if you diarrhea is getting worse. Return to ED if you feel worse such as getting lightheaded or dizzy, weak, or get abdominal pain. Diarrhea Diarrhea is watery poop (stool). It can make you feel weak, tired, thirsty, or give you a dry mouth (signs of dehydration). Watery poop is a sign of another problem, most often an infection. It often lasts 2-3 days. It can last longer if it is a sign of something serious. Take care of yourself as told by your doctor. HOME CARE   Drink 1 cup (8 ounces) of fluid each time you have watery poop.  Do not drink the following fluids:  Those that contain simple sugars (fructose, glucose, galactose, lactose, sucrose, maltose).  Sports drinks.  Fruit juices.  Whole milk products.  Sodas.  Drinks with caffeine (coffee, tea, soda) or alcohol.  Oral rehydration solution may be used if the doctor says it is okay. You may make your own solution. Follow this recipe:   - teaspoon table salt.   teaspoon baking soda.   teaspoon salt substitute containing potassium chloride.  1 tablespoons sugar.  1 liter (34 ounces) of water.  Avoid the following foods:  High fiber foods, such as raw fruits and vegetables.  Nuts, seeds, and whole grain breads and cereals.   Those that are sweetened with sugar alcohols (xylitol, sorbitol, mannitol).  Try eating the following foods:  Starchy foods, such as rice, toast, pasta, low-sugar cereal, oatmeal, baked potatoes, crackers, and bagels.  Bananas.  Applesauce.  Eat probiotic-rich foods, such as yogurt and milk products that are fermented.  Wash your hands well after each time you have watery poop.  Only take medicine as told by your doctor.  Take a warm bath to help lessen burning or pain from having watery poop. GET HELP RIGHT AWAY IF:   You  cannot drink fluids without throwing up (vomiting).  You keep throwing up.  You have blood in your poop, or your poop looks black and tarry.  You do not pee (urinate) in 6-8 hours, or there is only a small amount of very dark pee.  You have belly (abdominal) pain that gets worse or stays in the same spot (localizes).  You are weak, dizzy, confused, or light-headed.  You have a very bad headache.  Your watery poop gets worse or does not get better.  You have a fever or lasting symptoms for more than 2-3 days.  You have a fever and your symptoms suddenly get worse. MAKE SURE YOU:   Understand these instructions.  Will watch your condition.  Will get help right away if you are not doing well or get worse. Document Released: 06/04/2008 Document Revised: 05/03/2014 Document Reviewed: 08/24/2012 [facility] Patient Information 2015 [place], [place]. This information is not intended to replace advice given to you by your health care provider. Make sure you discuss any questions you have with your health care provider.  Hypokalemia Hypokalemia means that the amount of potassium in the blood is lower than normal.Potassium is a chemical, called an electrolyte, that helps regulate the amount of fluid in the body. It also stimulates muscle contraction and helps nerves function properly.Most of the body's potassium is inside of cells, and only a very small amount is in the blood. Because the amount in the blood is so small, minor changes can be  life-threatening. CAUSES  Antibiotics.  Diarrhea or vomiting.  Using laxatives too much, which can cause diarrhea.  Chronic kidney disease.  Water pills (diuretics).  Eating disorders (bulimia).  Low magnesium level.  Sweating a lot. SIGNS AND SYMPTOMS  Weakness.  Constipation.  Fatigue.  Muscle cramps.  Mental confusion.  Skipped heartbeats or irregular heartbeat (palpitations).  Tingling or numbness. DIAGNOSIS  Your health  care provider can diagnose hypokalemia with blood tests. In addition to checking your potassium level, your health care provider may also check other lab tests. TREATMENT Hypokalemia can be treated with potassium supplements taken by mouth or adjustments in your current medicines. If your potassium level is very low, you may need to get potassium through a vein (IV) and be monitored in the hospital. A diet high in potassium is also helpful. Foods high in potassium are:  Nuts, such as peanuts and pistachios.  Seeds, such as sunflower seeds and pumpkin seeds.  Peas, lentils, and lima beans.  Whole grain and bran cereals and breads.  Fresh fruit and vegetables, such as apricots, avocado, bananas, cantaloupe, kiwi, oranges, tomatoes, asparagus, and potatoes.  Orange and tomato juices.  Red meats.  Fruit yogurt. HOME CARE INSTRUCTIONS  Take all medicines as prescribed by your health care provider.  Maintain a healthy diet by including nutritious food, such as fruits, vegetables, nuts, whole grains, and lean meats.  If you are taking a laxative, be sure to follow the directions on the label. SEEK MEDICAL CARE IF:  Your weakness gets worse.  You feel your heart pounding or racing.  You are vomiting or having diarrhea.  You are diabetic and having trouble keeping your blood glucose in the normal range. SEEK IMMEDIATE MEDICAL CARE IF:  You have chest pain, shortness of breath, or dizziness.  You are vomiting or having diarrhea for more than 2 days.  You faint. MAKE SURE YOU:   Understand these instructions.  Will watch your condition.  Will get help right away if you are not doing well or get worse. Document Released: 12/17/2005 Document Revised: 10/07/2013 Document Reviewed: 06/19/2013 [facility] Patient Information 2015 [place], [place]. This information is not intended to replace advice given to you by your health care provider. Make sure you discuss any questions you have  with your health care provider.

## 2015-09-25 NOTE — ED Notes (Signed)
MD at bedside. 

## 2015-09-25 NOTE — ED Provider Notes (Signed)
CSN: [id number]     Arrival date & time 09/25/15  0350 History   First MD Initiated Contact with Patient 09/25/15 [phone]     Chief Complaint  Patient presents with  . Diarrhea  . Dehydration     (Consider location/radiation/quality/duration/timing/severity/associated sxs/prior Treatment) HPI patient has metastatic liver cancer and is currently getting chemotherapy. He reports he typically gets diarrhea the day after chemotherapy and then a few days before it's time to start chemotherapy again. He last received his chemotherapy 2 weeks ago and he is scheduled to have it again in 2 days. He reports he started getting diarrhea at 11 AM on September 24. He had 4 loose stools that were large volumes. There was no blood. He denies any abdominal pain but states his abdomen has been growling. He denies nausea or vomiting. He does feel weak. He denies fever. His wife reports normally they use Imodium and that controls his diarrhea however he has had 4 doses in the last 24 hours and it is not helping with his diarrhea. His last diarrhea episode was an hour and a half prior to arrival to the ED.   PCP Dr Ardeth Perfect Oncology Dr Benay Spice  Past Medical History  Diagnosis Date  . Diabetes mellitus without complication   . Hypertension   . Metastatic carcinoma involving liver with unknown primary site   . Hyperlipidemia    Past Surgical History  Procedure Laterality Date  . I&d extremity Left 06/19/2013    Procedure: IRRIGATION AND DEBRIDEMENT EXTREMITY;  Surgeon: Newt Minion, MD;  Location: [place];  Service: Orthopedics;  Laterality: Left;  . I&d extremity Left 07/02/2013    Procedure: IRRIGATION AND DEBRIDEMENT EXTREMITY with placement of antibiotic beads and application of wound vac;  Surgeon: Newt Minion, MD;  Location: [place];  Service: Orthopedics;  Laterality: Left;  Irrigation and Debridement Left Foot, Place Beads, Wound VAC  . 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 Ercp N/A 07/14/2015    Procedure: ENDOSCOPIC RETROGRADE CHOLANGIOPANCREATOGRAPHY (ERCP);  Surgeon: Ladene Artist, MD;  Location[facility]COPY;  Service: Endoscopy;  Laterality: N/A;   Family History  Problem Relation Age of Onset  . Heart failure Mother   . Heart attack Mother   . Leukemia Father   . Colon cancer Neg Hx    Social History  Substance Use Topics  . Smoking status: Never Smoker   . Smokeless tobacco: Never Used  . Alcohol Use: 0.0 oz/week    0 Standard drinks or equivalent per week     Comment: social drinker  lives at home Lives with spouse  Review of Systems  All other systems reviewed and are negative.     Allergies  Review of patient's allergies indicates no known allergies.  Home Medications   Prior to Admission medications   Medication Sig Start Date End Date Taking? Authorizing Provider  ferrous sulfate 325 (65 FE) MG tablet Take 325 mg by mouth 2 (two) times daily with a meal.   Yes Historical Provider, MD  ondansetron (ZOFRAN) 8 MG tablet Take 1 tablet (8 mg total) by mouth every 8 (eight) hours as needed for nausea or vomiting. 07/26/15  Yes Ladell Pier, MD  potassium chloride (KLOR-CON) 20 MEQ packet Take 20 mEq by mouth daily. 09/13/15  Yes Owens Shark, NP  prochlorperazine (COMPAZINE)  10 MG tablet TAKE 1 TABLET (10 MG TOTAL) BY MOUTH EVERY 6 (SIX) HOURS AS NEEDED FOR NAUSEA OR VOMITING. 09/23/15  Yes Ladell Pier, MD  diphenoxylate-atropine (LOMOTIL) 2.5-0.025 MG per tablet Take 1 tablet by mouth 4 (four) times daily as needed for diarrhea or loose stools (max 8 tabs/d). 09/25/15   Rolland Porter, MD  lidocaine-prilocaine (EMLA) cream Apply small amount of cream over port area 1-2 hours prior to treatment and cover with plastic wrap.  DO NOT RUB IN 07/26/15   Ladell Pier, MD  potassium chloride SA (K-DUR,KLOR-CON) 20 MEQ tablet Take 1  tablet (20 mEq total) by mouth 2 (two) times daily. 09/25/15   Rolland Porter, MD  traMADol (ULTRAM) 50 MG tablet Take 1 tablet (50 mg total) by mouth every 6 (six) hours as needed. Patient taking differently: Take 50 mg by mouth every 6 (six) hours as needed for moderate pain.  07/19/15   Theodis Blaze, MD   BP 155/83 mmHg  Pulse 97  Temp(Src) 98.1 F (36.7 C) (Oral)  Resp 14  Ht 6\' 3"  (1.905 m)  Wt 250 lb (113.399 kg)  BMI 31.25 kg/m2  SpO2 97%  Vital signs normal   Physical Exam  Constitutional: He is oriented to person, place, and time. He appears well-developed and well-nourished.  Non-toxic appearance. He does not appear ill. No distress.  Patient appears chronically ill. He has muscle wasting of his face.  HENT:  Head: Normocephalic and atraumatic.  Right Ear: External ear normal.  Left Ear: External ear normal.  Nose: Nose normal. No mucosal edema or rhinorrhea.  Mouth/Throat: Oropharynx is clear and moist and mucous membranes are normal. No dental abscesses or uvula swelling.  Eyes: Conjunctivae and EOM are normal. Pupils are equal, round, and reactive to light.  Neck: Normal range of motion and full passive range of motion without pain. Neck supple.  Cardiovascular: Normal rate, regular rhythm and normal heart sounds.  Exam reveals no gallop and no friction rub.   No murmur heard. Pulmonary/Chest: Effort normal and breath sounds normal. No respiratory distress. He has no wheezes. He has no rhonchi. He has no rales. He exhibits no tenderness and no crepitus.  Abdominal: Soft. Normal appearance and bowel sounds are normal. He exhibits distension. There is no tenderness. There is no rebound and no guarding.  Although patient's abdomen is very distended it is soft and nontender to palpation.  Musculoskeletal: Normal range of motion. He exhibits edema. He exhibits no tenderness.  Patient has 2+ to 3+ pain edema of his lower extremities. Patient has muscle wasting of his upper  extremities  Neurological: He is alert and oriented to person, place, and time. He has normal strength. No cranial nerve deficit.  Skin: Skin is warm, dry and intact. No rash noted. No erythema. No pallor.  Psychiatric: He has a normal mood and affect. His speech is normal and behavior is normal. His mood appears not anxious.  Nursing note and vitals reviewed.   ED Course  Procedures (including critical care time)  Medications  0.9 %  sodium chloride infusion (0 mLs Intravenous Stopped 09/25/15 0539)    Followed by  0.9 %  sodium chloride infusion (0 mLs Intravenous Stopped 09/25/15 0638)    Followed by  0.9 %  sodium chloride infusion (1,000 mLs Intravenous New Bag/Given 09/25/15 0639)  potassium chloride SA (K-DUR,KLOR-CON) CR tablet 40 mEq (not administered)  diphenoxylate-atropine (LOMOTIL) 2.5-0.025 MG per tablet 1 tablet (1 tablet Oral Given 09/25/15  0430)    Patient was given IV fluids to rehydrate him. He was given Lomotil for his diarrhea.   Patient was rechecked at 6:30 AM. He has had no further episodes of diarrhea while in the ED. He has no abdominal pain. He has had 2 L of IV fluid. He has not had urinary output however he feels better and feels improved to go home. He was given a prescription for Lomotil to use for his diarrhea. He was also given extra potassium to take for his hypokalemia. His wife states he is taking potassium pills but with his diarrhea he needs extra doses.  Labs Review Results for orders placed or performed during the hospital encounter of 09/25/15  Lipase, blood  Result Value Ref Range   Lipase <10 (L) 22 - 51 U/L  Comprehensive metabolic panel  Result Value Ref Range   Sodium 140 135 - 145 mmol/L   Potassium 3.3 (L) 3.5 - 5.1 mmol/L   Chloride 107 101 - 111 mmol/L   CO2 23 22 - 32 mmol/L   Glucose, Bld 167 (H) 65 - 99 mg/dL   BUN 16 6 - 20 mg/dL   Creatinine, Ser 0.90 0.61 - 1.24 mg/dL   Calcium 8.1 (L) 8.9 - 10.3 mg/dL   Total Protein 6.1 (L)  6.5 - 8.1 g/dL   Albumin 2.2 (L) 3.5 - 5.0 g/dL   AST 25 15 - 41 U/L   ALT 10 (L) 17 - 63 U/L   Alkaline Phosphatase 276 (H) 38 - 126 U/L   Total Bilirubin 1.7 (H) 0.3 - 1.2 mg/dL   GFR calc non Af Amer >60 >60 mL/min   GFR calc Af Amer >60 >60 mL/min   Anion gap 10 5 - 15  CBC  Result Value Ref Range   WBC 4.8 4.0 - 10.5 K/uL   RBC 3.69 (L) 4.22 - 5.81 MIL/uL   Hemoglobin 10.3 (L) 13.0 - 17.0 g/dL   HCT 32.7 (L) 39.0 - 52.0 %   MCV 88.6 78.0 - 100.0 fL   MCH 27.9 26.0 - 34.0 pg   MCHC 31.5 30.0 - 36.0 g/dL   RDW 17.7 (H) 11.5 - 15.5 %   Platelets 160 150 - 400 K/uL   Laboratory interpretation all normal except hypokalemia, malnutrition, stable anemia     Imaging Review No results found. I have personally reviewed and evaluated these images and lab results as part of my medical decision-making.   EKG Interpretation None      MDM   Final diagnoses:  Diarrhea  Dehydration  Hypokalemia   New Prescriptions   DIPHENOXYLATE-ATROPINE (LOMOTIL) 2.5-0.025 MG PER TABLET    Take 1 tablet by mouth 4 (four) times daily as needed for diarrhea or loose stools (max 8 tabs/d).   POTASSIUM CHLORIDE SA (K-DUR,KLOR-CON) 20 MEQ TABLET    Take 1 tablet (20 mEq total) by mouth 2 (two) times daily.    Plan discharge  Rolland Porter, MD, Barbette Or, MD 09/25/15 [phone]

## 2015-09-26 ENCOUNTER — Telehealth: Payer: Self-pay | Admitting: *Deleted

## 2015-09-26 NOTE — Telephone Encounter (Signed)
Returned call to pt's wife, she reports he has had one loose stool today, took a dose of Lomotil. Asks how many he can take per day? Informed her he can take it 4 times per day. She voiced understanding, scheduled for follow up tomorrow. Noted his chemo had not been scheduled. Chemo appt added for 11:15 (only slot available). Chemo scheduler will move up if there are any cancellations.

## 2015-09-27 ENCOUNTER — Telehealth: Payer: Self-pay | Admitting: Nurse Practitioner

## 2015-09-27 ENCOUNTER — Telehealth: Payer: Self-pay | Admitting: *Deleted

## 2015-09-27 ENCOUNTER — Ambulatory Visit: Payer: Managed Care, Other (non HMO)

## 2015-09-27 ENCOUNTER — Other Ambulatory Visit (HOSPITAL_BASED_OUTPATIENT_CLINIC_OR_DEPARTMENT_OTHER): Payer: Managed Care, Other (non HMO)

## 2015-09-27 ENCOUNTER — Ambulatory Visit (HOSPITAL_BASED_OUTPATIENT_CLINIC_OR_DEPARTMENT_OTHER): Payer: Managed Care, Other (non HMO) | Admitting: Nurse Practitioner

## 2015-09-27 ENCOUNTER — Encounter: Payer: Self-pay | Admitting: *Deleted

## 2015-09-27 VITALS — BP 141/88 | HR 87 | Temp 97.9°F | Resp 18 | Ht 75.0 in

## 2015-09-27 DIAGNOSIS — D709 Neutropenia, unspecified: Secondary | ICD-10-CM

## 2015-09-27 DIAGNOSIS — D630 Anemia in neoplastic disease: Secondary | ICD-10-CM | POA: Diagnosis not present

## 2015-09-27 DIAGNOSIS — R918 Other nonspecific abnormal finding of lung field: Secondary | ICD-10-CM

## 2015-09-27 DIAGNOSIS — J90 Pleural effusion, not elsewhere classified: Secondary | ICD-10-CM

## 2015-09-27 DIAGNOSIS — E119 Type 2 diabetes mellitus without complications: Secondary | ICD-10-CM

## 2015-09-27 DIAGNOSIS — C187 Malignant neoplasm of sigmoid colon: Secondary | ICD-10-CM

## 2015-09-27 DIAGNOSIS — C787 Secondary malignant neoplasm of liver and intrahepatic bile duct: Secondary | ICD-10-CM

## 2015-09-27 DIAGNOSIS — C189 Malignant neoplasm of colon, unspecified: Secondary | ICD-10-CM

## 2015-09-27 LAB — CBC WITH DIFFERENTIAL/PLATELET
BASO%: 2 % (ref 0.0–2.0)
BASOS ABS: 0.1 10*3/uL (ref 0.0–0.1)
EOS ABS: 0 10*3/uL (ref 0.0–0.5)
EOS%: 0.8 % (ref 0.0–7.0)
HCT: 34.6 % — ABNORMAL LOW (ref 38.4–49.9)
HGB: 10.6 g/dL — ABNORMAL LOW (ref 13.0–17.1)
LYMPH%: 33.3 % (ref 14.0–49.0)
MCH: 27.3 pg (ref 27.2–33.4)
MCHC: 30.6 g/dL — AB (ref 32.0–36.0)
MCV: 89.2 fL (ref 79.3–98.0)
MONO#: 0.7 10*3/uL (ref 0.1–0.9)
MONO%: 27 % — AB (ref 0.0–14.0)
NEUT#: 0.9 10*3/uL — ABNORMAL LOW (ref 1.5–6.5)
NEUT%: 36.9 % — ABNORMAL LOW (ref 39.0–75.0)
PLATELETS: 195 10*3/uL (ref 140–400)
RBC: 3.88 10*6/uL — AB (ref 4.20–5.82)
RDW: 18.5 % — ABNORMAL HIGH (ref 11.0–14.6)
WBC: 2.5 10*3/uL — AB (ref 4.0–10.3)
lymph#: 0.8 10*3/uL — ABNORMAL LOW (ref 0.9–3.3)
nRBC: 0 % (ref 0–0)

## 2015-09-27 LAB — COMPREHENSIVE METABOLIC PANEL (CC13)
ALT: <9 U/L (ref 0–55)
ANION GAP: 8 meq/L (ref 3–11)
AST: 18 U/L (ref 5–34)
Albumin: 2.1 g/dL — ABNORMAL LOW (ref 3.5–5.0)
Alkaline Phosphatase: 322 U/L — ABNORMAL HIGH (ref 40–150)
BILIRUBIN TOTAL: 1.84 mg/dL — AB (ref 0.20–1.20)
BUN: 13 mg/dL (ref 7.0–26.0)
CHLORIDE: 109 meq/L (ref 98–109)
CO2: 23 meq/L (ref 22–29)
Calcium: 8.2 mg/dL — ABNORMAL LOW (ref 8.4–10.4)
Creatinine: 0.8 mg/dL (ref 0.7–1.3)
Glucose: 99 mg/dl (ref 70–140)
POTASSIUM: 4.1 meq/L (ref 3.5–5.1)
Sodium: 140 mEq/L (ref 136–145)
Total Protein: 6.2 g/dL — ABNORMAL LOW (ref 6.4–8.3)

## 2015-09-27 LAB — CEA: CEA: 6525.2 ng/mL — ABNORMAL HIGH (ref 0.0–5.0)

## 2015-09-27 MED ORDER — DIPHENOXYLATE-ATROPINE 2.5-0.025 MG PO TABS: 1.0000 | ORAL_TABLET | Freq: Four times a day (QID) | ORAL | Status: AC | PRN

## 2015-09-27 NOTE — Telephone Encounter (Signed)
Per staff message and POF I have scheduled appts. Advised scheduler of appts. JMW  

## 2015-09-27 NOTE — Progress Notes (Addendum)
[place] OFFICE PROGRESS NOTE   Diagnosis: Colon cancer   INTERVAL HISTORY:   Jaime [name] returns as scheduled. He completed cycle 4 FOLFOX plus Avastin 09/13/2015. He was seen in the emergency department on 09/25/2015 with diarrhea, dehydration. He received IV fluids and an anti-diarrheal. He was discharged home.  He has mild intermittent nausea. No mouth sores. Cold sensitivity lasted 5 days. No change in baseline intermittent loose stools around the time of chemotherapy. He developed diarrhea on 09/24/2015. He had 3 or 4 large volume loose stools. He took Imodium with no improvement. He had no loose stools on 09/25/2015. He had one loose stool yesterday. He took Lomotil.  Objective:  Vital signs in last 24 hours:  Blood pressure 141/88, pulse 87, temperature 97.9 F (36.6 C), temperature source Oral, resp. rate 18, height 6\' 3"  (1.905 m), SpO2 100 %.    HEENT: No thrush or ulcers. Resp: Lungs clear bilaterally. Cardio: Regular rate and rhythm. GI: Abdomen is nontender. Mildly distended.  Vascular: Pitting edema throughout both legs left greater than right. Port-A-Cath without erythema.   Lab Results:  Lab Results  Component Value Date   WBC 2.5* 09/27/2015   HGB 10.6* 09/27/2015   HCT 34.6* 09/27/2015   MCV 89.2 09/27/2015   PLT 195 09/27/2015   NEUTROABS 0.9* 09/27/2015    Imaging:  No results found.  Medications: I have reviewed the patient's current medications.  Assessment/Plan: 1. 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, status post a colonoscopy 07/21/2015 confirming a sigmoid colon mass  Staging CT scans 07/13/2015 and 07/14/2015 confirmed extensive liver metastases, lung nodules, a right pleural effusion, and porta hepatis lymphadenopathy  Cycle 1 FOLFOX (chemotherapy doses reduced and bolus 5-FU eliminated) 08/02/2015  Cycle 2 FOLFOX (chemotherapy doses reduced and bolus 5-FU eliminated) 08/16/2015  Cycle 3 FOLFOX (Avastin  added, bolus 5-FU introduced, and infusional 5-FU dose escalated) 08/30/2015  Cycle 4 FOLFOX plus Avastin 09/13/2015  Cycle 5 held on 09/27/2015 due to mild neutropenia, recent diarrhea.  2. Obstructive jaundice, likely secondary to porta hepatis lymphadenopathy, status post placement of a bile duct stent on 07/14/2015  3. Microcytic anemia secondary to the colon mass-improved  4. Diabetes  5. Failure to thrive secondary to metastatic colon cancer  6. Port-A-Cath placement 08/01/2015  7. Nausea following cycle 1 and cycle 2 FOLFOX-emend added with cycle 3  8.   Diarrhea following cycle 4 FOLFOX/Avastin.  9.   Neutropenia following cycle 4 FOLFOX/Avastin. Neulasta to be added with cycle 5.   Disposition: Jaime [name] appears stable. He has completed 4 cycles of FOLFOX plus Avastin. He developed diarrhea over the past weekend likely related to chemotherapy. The diarrhea is better. He is mildly neutropenic. We are holding today's treatment and rescheduling for one week. He will receive Neulasta on the day of pump discontinuation. We reviewed potential toxicities associated with Neulasta including bone pain, rash, splenic rupture. He is agreeable with this plan.  The plan is for restaging CT scans after completion of 5 cycles.  He will return for cycle 5 FOLFOX/Avastin in one week. He will return for a follow-up visit in 3 weeks. He will contact the office in the interim with any problems. We specifically discussed diarrhea not controlled with antidiarrhea medication.  Patient seen with Dr. Benay Spice. 25 minutes were spent face-to-face at today's visit with the majority of that time involved in counseling/coordination of care.      Ned Card ANP/GNP-BC   09/27/2015  9:25 AM  This  was a shared visit with Ned Card. Jaime [name] was interviewed and examined. We decided to hold chemotherapy today secondary to the recent diarrhea and neutropenia. He will return for  chemotherapy next week. He will undergo a restaging CT evaluation after this cycle.  Julieanne Manson, M.D.

## 2015-09-27 NOTE — Telephone Encounter (Signed)
per pof to sch pt appt-gave pt copy of avs-sent MAW email to sch trmt-pt aware of appt

## 2015-09-28 ENCOUNTER — Telehealth: Payer: Self-pay | Admitting: *Deleted

## 2015-09-28 NOTE — Telephone Encounter (Signed)
-----   Message from Ladell Pier, MD sent at 09/28/2015  9:14 AM EDT ----- Please call patient, cea is better

## 2015-09-29 ENCOUNTER — Telehealth: Payer: Self-pay | Admitting: *Deleted

## 2015-09-29 NOTE — Telephone Encounter (Signed)
-----   Message from Ladell Pier, MD sent at 09/28/2015  9:14 AM EDT ----- Please call patient, cea is better

## 2015-09-29 NOTE — Telephone Encounter (Signed)
Per Dr. Benay Spice; left voice message on known voice mail that cea is better; any questions to contact office.

## 2015-10-02 ENCOUNTER — Inpatient Hospital Stay (HOSPITAL_COMMUNITY)
Admit: 2015-10-02 | Discharge: 2015-10-02 | Disposition: A | Discharge status: Home / Self Care | Payer: Managed Care, Other (non HMO) | Attending: Pulmonary Disease | Admitting: Pulmonary Disease

## 2015-10-02 ENCOUNTER — Inpatient Hospital Stay (HOSPITAL_COMMUNITY)
Admission: EM | Admit: 2015-10-02 | Discharge: 2015-10-12 | DRG: 871 | Disposition: A | Discharge status: Skilled Nursing Facility | Payer: Managed Care, Other (non HMO) | Attending: Internal Medicine | Admitting: Internal Medicine

## 2015-10-02 ENCOUNTER — Emergency Department (HOSPITAL_COMMUNITY): Payer: Managed Care, Other (non HMO)

## 2015-10-02 ENCOUNTER — Encounter (HOSPITAL_COMMUNITY): Payer: Self-pay | Admitting: Emergency Medicine

## 2015-10-02 DIAGNOSIS — J9601 Acute respiratory failure with hypoxia: Secondary | ICD-10-CM | POA: Diagnosis present

## 2015-10-02 DIAGNOSIS — E778 Other disorders of glycoprotein metabolism: Secondary | ICD-10-CM | POA: Diagnosis present

## 2015-10-02 DIAGNOSIS — I110 Hypertensive heart disease with heart failure: Secondary | ICD-10-CM | POA: Diagnosis present

## 2015-10-02 DIAGNOSIS — Z79899 Other long term (current) drug therapy: Secondary | ICD-10-CM

## 2015-10-02 DIAGNOSIS — Z806 Family history of leukemia: Secondary | ICD-10-CM

## 2015-10-02 DIAGNOSIS — I82413 Acute embolism and thrombosis of femoral vein, bilateral: Secondary | ICD-10-CM | POA: Diagnosis present

## 2015-10-02 DIAGNOSIS — E785 Hyperlipidemia, unspecified: Secondary | ICD-10-CM | POA: Diagnosis present

## 2015-10-02 DIAGNOSIS — L89152 Pressure ulcer of sacral region, stage 2: Secondary | ICD-10-CM | POA: Diagnosis present

## 2015-10-02 DIAGNOSIS — D509 Iron deficiency anemia, unspecified: Secondary | ICD-10-CM | POA: Diagnosis present

## 2015-10-02 DIAGNOSIS — I82449 Acute embolism and thrombosis of unspecified tibial vein: Secondary | ICD-10-CM | POA: Diagnosis present

## 2015-10-02 DIAGNOSIS — T501X5A Adverse effect of loop [high-ceiling] diuretics, initial encounter: Secondary | ICD-10-CM | POA: Diagnosis present

## 2015-10-02 DIAGNOSIS — K831 Obstruction of bile duct: Secondary | ICD-10-CM | POA: Diagnosis present

## 2015-10-02 DIAGNOSIS — E440 Moderate protein-calorie malnutrition: Secondary | ICD-10-CM | POA: Diagnosis present

## 2015-10-02 DIAGNOSIS — E119 Type 2 diabetes mellitus without complications: Secondary | ICD-10-CM | POA: Diagnosis present

## 2015-10-02 DIAGNOSIS — Z79891 Long term (current) use of opiate analgesic: Secondary | ICD-10-CM

## 2015-10-02 DIAGNOSIS — Z8249 Family history of ischemic heart disease and other diseases of the circulatory system: Secondary | ICD-10-CM | POA: Diagnosis not present

## 2015-10-02 DIAGNOSIS — K529 Noninfective gastroenteritis and colitis, unspecified: Secondary | ICD-10-CM | POA: Diagnosis present

## 2015-10-02 DIAGNOSIS — Z6831 Body mass index (BMI) 31.0-31.9, adult: Secondary | ICD-10-CM

## 2015-10-02 DIAGNOSIS — C7800 Secondary malignant neoplasm of unspecified lung: Secondary | ICD-10-CM | POA: Diagnosis present

## 2015-10-02 DIAGNOSIS — I480 Paroxysmal atrial fibrillation: Secondary | ICD-10-CM | POA: Diagnosis present

## 2015-10-02 DIAGNOSIS — D6481 Anemia due to antineoplastic chemotherapy: Secondary | ICD-10-CM

## 2015-10-02 DIAGNOSIS — C799 Secondary malignant neoplasm of unspecified site: Secondary | ICD-10-CM

## 2015-10-02 DIAGNOSIS — R188 Other ascites: Secondary | ICD-10-CM | POA: Diagnosis present

## 2015-10-02 DIAGNOSIS — E876 Hypokalemia: Secondary | ICD-10-CM | POA: Diagnosis present

## 2015-10-02 DIAGNOSIS — C187 Malignant neoplasm of sigmoid colon: Secondary | ICD-10-CM | POA: Diagnosis present

## 2015-10-02 DIAGNOSIS — D630 Anemia in neoplastic disease: Secondary | ICD-10-CM | POA: Diagnosis present

## 2015-10-02 DIAGNOSIS — E872 Acidosis: Secondary | ICD-10-CM | POA: Diagnosis present

## 2015-10-02 DIAGNOSIS — D701 Agranulocytosis secondary to cancer chemotherapy: Secondary | ICD-10-CM

## 2015-10-02 DIAGNOSIS — L8990 Pressure ulcer of unspecified site, unspecified stage: Secondary | ICD-10-CM

## 2015-10-02 DIAGNOSIS — C189 Malignant neoplasm of colon, unspecified: Secondary | ICD-10-CM

## 2015-10-02 DIAGNOSIS — J90 Pleural effusion, not elsewhere classified: Secondary | ICD-10-CM | POA: Diagnosis present

## 2015-10-02 DIAGNOSIS — R531 Weakness: Secondary | ICD-10-CM | POA: Diagnosis present

## 2015-10-02 DIAGNOSIS — M7989 Other specified soft tissue disorders: Secondary | ICD-10-CM

## 2015-10-02 DIAGNOSIS — I5032 Chronic diastolic (congestive) heart failure: Secondary | ICD-10-CM | POA: Diagnosis present

## 2015-10-02 DIAGNOSIS — I119 Hypertensive heart disease without heart failure: Secondary | ICD-10-CM | POA: Diagnosis present

## 2015-10-02 DIAGNOSIS — I82433 Acute embolism and thrombosis of popliteal vein, bilateral: Secondary | ICD-10-CM | POA: Diagnosis present

## 2015-10-02 DIAGNOSIS — A419 Sepsis, unspecified organism: Principal | ICD-10-CM | POA: Diagnosis present

## 2015-10-02 DIAGNOSIS — R6521 Severe sepsis with septic shock: Secondary | ICD-10-CM | POA: Diagnosis present

## 2015-10-02 DIAGNOSIS — R627 Adult failure to thrive: Secondary | ICD-10-CM | POA: Diagnosis present

## 2015-10-02 DIAGNOSIS — D638 Anemia in other chronic diseases classified elsewhere: Secondary | ICD-10-CM | POA: Diagnosis present

## 2015-10-02 DIAGNOSIS — I2699 Other pulmonary embolism without acute cor pulmonale: Secondary | ICD-10-CM | POA: Diagnosis present

## 2015-10-02 DIAGNOSIS — I2609 Other pulmonary embolism with acute cor pulmonale: Secondary | ICD-10-CM | POA: Diagnosis not present

## 2015-10-02 DIAGNOSIS — I2602 Saddle embolus of pulmonary artery with acute cor pulmonale: Secondary | ICD-10-CM | POA: Diagnosis present

## 2015-10-02 DIAGNOSIS — C787 Secondary malignant neoplasm of liver and intrahepatic bile duct: Secondary | ICD-10-CM | POA: Diagnosis present

## 2015-10-02 DIAGNOSIS — T451X5A Adverse effect of antineoplastic and immunosuppressive drugs, initial encounter: Secondary | ICD-10-CM | POA: Diagnosis present

## 2015-10-02 DIAGNOSIS — R079 Chest pain, unspecified: Secondary | ICD-10-CM | POA: Diagnosis not present

## 2015-10-02 DIAGNOSIS — R579 Shock, unspecified: Secondary | ICD-10-CM

## 2015-10-02 DIAGNOSIS — I82403 Acute embolism and thrombosis of unspecified deep veins of lower extremity, bilateral: Secondary | ICD-10-CM | POA: Diagnosis not present

## 2015-10-02 HISTORY — DX: Chronic diastolic (congestive) heart failure: I50.32

## 2015-10-02 LAB — URINALYSIS, ROUTINE W REFLEX MICROSCOPIC
Glucose, UA: NEGATIVE mg/dL
HGB URINE DIPSTICK: NEGATIVE
Ketones, ur: NEGATIVE mg/dL
Nitrite: NEGATIVE
PH: 5.5 (ref 5.0–8.0)
Protein, ur: NEGATIVE mg/dL
SPECIFIC GRAVITY, URINE: 1.029 (ref 1.005–1.030)
Urobilinogen, UA: 2 mg/dL — ABNORMAL HIGH (ref 0.0–1.0)

## 2015-10-02 LAB — URINE MICROSCOPIC-ADD ON

## 2015-10-02 LAB — COMPREHENSIVE METABOLIC PANEL
ALBUMIN: 1.8 g/dL — AB (ref 3.5–5.0)
ALT: 9 U/L — ABNORMAL LOW (ref 17–63)
ANION GAP: 10 (ref 5–15)
AST: 26 U/L (ref 15–41)
Alkaline Phosphatase: 258 U/L — ABNORMAL HIGH (ref 38–126)
BILIRUBIN TOTAL: 1.6 mg/dL — AB (ref 0.3–1.2)
BUN: 17 mg/dL (ref 6–20)
CHLORIDE: 110 mmol/L (ref 101–111)
CO2: 19 mmol/L — ABNORMAL LOW (ref 22–32)
Calcium: 7.9 mg/dL — ABNORMAL LOW (ref 8.9–10.3)
Creatinine, Ser: 0.92 mg/dL (ref 0.61–1.24)
GFR calc Af Amer: >60 mL/min (ref >60)
GFR calc non Af Amer: >60 mL/min (ref >60)
GLUCOSE: 90 mg/dL (ref 65–99)
POTASSIUM: 3.7 mmol/L (ref 3.5–5.1)
SODIUM: 139 mmol/L (ref 135–145)
TOTAL PROTEIN: 5.4 g/dL — AB (ref 6.5–8.1)

## 2015-10-02 LAB — I-STAT CG4 LACTIC ACID, ED
LACTIC ACID, VENOUS: 3.03 mmol/L — AB (ref 0.5–2.0)
LACTIC ACID, VENOUS: 2.23 mmol/L — AB (ref 0.5–2.0)

## 2015-10-02 LAB — CBC WITH DIFFERENTIAL/PLATELET
BASOS ABS: 0 10*3/uL (ref 0.0–0.1)
BASOS ABS: 0 10*3/uL (ref 0.0–0.1)
BASOS PCT: 0 %
Basophils Relative: 1 %
EOS PCT: 0 %
Eosinophils Absolute: 0 10*3/uL (ref 0.0–0.7)
Eosinophils Absolute: 0 10*3/uL (ref 0.0–0.7)
Eosinophils Relative: 0 %
HEMATOCRIT: 33 % — AB (ref 39.0–52.0)
HEMATOCRIT: 31.6 % — AB (ref 39.0–52.0)
HEMOGLOBIN: 10.3 g/dL — AB (ref 13.0–17.0)
HEMOGLOBIN: 9.8 g/dL — AB (ref 13.0–17.0)
LYMPHS PCT: 19 %
Lymphocytes Relative: 13 %
Lymphs Abs: 0.3 10*3/uL — ABNORMAL LOW (ref 0.7–4.0)
Lymphs Abs: 0.4 10*3/uL — ABNORMAL LOW (ref 0.7–4.0)
MCH: 27.8 pg (ref 26.0–34.0)
MCH: 27.5 pg (ref 26.0–34.0)
MCHC: 31.2 g/dL (ref 30.0–36.0)
MCHC: 31 g/dL (ref 30.0–36.0)
MCV: 89.2 fL (ref 78.0–100.0)
MCV: 88.5 fL (ref 78.0–100.0)
MONOS PCT: 42 %
Monocytes Absolute: 0.6 10*3/uL (ref 0.1–1.0)
Monocytes Absolute: 1.1 10*3/uL — ABNORMAL HIGH (ref 0.1–1.0)
Monocytes Relative: 36 %
NEUTROS ABS: 0.5 10*3/uL — AB (ref 1.7–7.7)
NEUTROS ABS: 1.5 10*3/uL — AB (ref 1.7–7.7)
NEUTROS PCT: 51 %
Neutrophils Relative %: 38 %
Platelets: 178 10*3/uL (ref 150–400)
Platelets: 158 10*3/uL (ref 150–400)
RBC: 3.7 MIL/uL — AB (ref 4.22–5.81)
RBC: 3.57 MIL/uL — ABNORMAL LOW (ref 4.22–5.81)
RDW: 18.1 % — ABNORMAL HIGH (ref 11.5–15.5)
RDW: 17.9 % — AB (ref 11.5–15.5)
WBC: 1.4 10*3/uL — CL (ref 4.0–10.5)
WBC: 2.9 10*3/uL — ABNORMAL LOW (ref 4.0–10.5)

## 2015-10-02 LAB — PROTIME-INR
INR: 1.68 — AB (ref 0.00–1.49)
PROTHROMBIN TIME: 19.8 s — AB (ref 11.6–15.2)

## 2015-10-02 LAB — GLUCOSE, CAPILLARY: Glucose-Capillary: 98 mg/dL (ref 65–99)

## 2015-10-02 LAB — MRSA PCR SCREENING: MRSA BY PCR: NEGATIVE

## 2015-10-02 LAB — HEPARIN LEVEL (UNFRACTIONATED): Heparin Unfractionated: 0.36 IU/mL (ref 0.30–0.70)

## 2015-10-02 LAB — APTT: APTT: 34 s (ref 24–37)

## 2015-10-02 MED ORDER — CETYLPYRIDINIUM CHLORIDE 0.05 % MT LIQD
7.0000 mL | Freq: Two times a day (BID) | OROMUCOSAL | Status: DC
  Administered 2015-10-02 – 2015-10-12 (×15): 7 mL via OROMUCOSAL

## 2015-10-02 MED ORDER — HEPARIN (PORCINE) IN NACL 100-0.45 UNIT/ML-% IJ SOLN
1750.0000 [IU]/h | INTRAMUSCULAR | Status: DC
  Administered 2015-10-02 – 2015-10-03 (×3): 1750 [IU]/h via INTRAVENOUS
  Filled 2015-10-02 (×5): qty 250

## 2015-10-02 MED ORDER — VANCOMYCIN HCL 10 G IV SOLR
2000.0000 mg | Freq: Once | INTRAVENOUS | Status: AC
  Administered 2015-10-02: 2000 mg via INTRAVENOUS
  Filled 2015-10-02: qty 2000

## 2015-10-02 MED ORDER — ONDANSETRON HCL 4 MG/2ML IJ SOLN
4.0000 mg | Freq: Once | INTRAMUSCULAR | Status: AC
  Administered 2015-10-02: 4 mg via INTRAVENOUS
  Filled 2015-10-02: qty 2

## 2015-10-02 MED ORDER — NOREPINEPHRINE BITARTRATE 1 MG/ML IV SOLN
2.0000 ug/min | INTRAVENOUS | Status: DC
  Administered 2015-10-02: 4.27 ug/min via INTRAVENOUS
  Filled 2015-10-02: qty 4

## 2015-10-02 MED ORDER — FENTANYL CITRATE (PF) 100 MCG/2ML IJ SOLN: 50.0000 ug | INTRAMUSCULAR | Status: DC | PRN

## 2015-10-02 MED ORDER — TRAMADOL HCL 50 MG PO TABS: 50.0000 mg | ORAL_TABLET | Freq: Four times a day (QID) | ORAL | Status: DC | PRN

## 2015-10-02 MED ORDER — IOHEXOL 350 MG/ML SOLN
100.0000 mL | Freq: Once | INTRAVENOUS | Status: AC | PRN
  Administered 2015-10-02: 100 mL via INTRAVENOUS

## 2015-10-02 MED ORDER — SODIUM CHLORIDE 0.9 % IV SOLN
INTRAVENOUS | Status: DC
  Administered 2015-10-02: 100 mL/h via INTRAVENOUS
  Administered 2015-10-03: 100 mL via INTRAVENOUS
  Administered 2015-10-06 – 2015-10-07 (×2): via INTRAVENOUS

## 2015-10-02 MED ORDER — PIPERACILLIN-TAZOBACTAM 3.375 G IVPB 30 MIN
3.3750 g | Freq: Once | INTRAVENOUS | Status: AC
  Administered 2015-10-02: 3.375 g via INTRAVENOUS
  Filled 2015-10-02: qty 50

## 2015-10-02 MED ORDER — HEPARIN BOLUS VIA INFUSION
3000.0000 [IU] | Freq: Once | INTRAVENOUS | Status: AC
  Administered 2015-10-02: 3000 [IU] via INTRAVENOUS
  Filled 2015-10-02: qty 3000

## 2015-10-02 MED ORDER — ONDANSETRON HCL 4 MG/2ML IJ SOLN
4.0000 mg | Freq: Four times a day (QID) | INTRAMUSCULAR | Status: DC | PRN
  Administered 2015-10-10: 4 mg via INTRAVENOUS
  Filled 2015-10-02: qty 2

## 2015-10-02 MED ORDER — SODIUM CHLORIDE 0.9 % IV BOLUS (SEPSIS)
2000.0000 mL | Freq: Once | INTRAVENOUS | Status: AC
  Administered 2015-10-02: 2000 mL via INTRAVENOUS

## 2015-10-02 NOTE — ED Notes (Signed)
Awake. Verbally responsive. A/O x4. Resp even and unlabored. No audible adventitious breath sounds noted. ABC's intact. SR on monito. IV infusing flds and meds as order without difficulty. Family at bedside.

## 2015-10-02 NOTE — ED Notes (Signed)
Lab delay on Istat Chem 8 - RN trying to access port.

## 2015-10-02 NOTE — Progress Notes (Signed)
ANTICOAGULATION CONSULT NOTE - Initial Consult  Pharmacy Consult for Heparin Indication: pulmonary embolus  No Known Allergies  Patient Measurements: Height: 6\' 3"  (190.5 cm) Weight: 250 lb (113.399 kg) IBW/kg (Calculated) : 84.5 Heparin Dosing Weight: 108 kg  Vital Signs: BP: 110/56 mmHg (10/02 1226) Pulse Rate: 102 (10/02 1226)  Labs:  Recent Labs  10/02/15 1045  HGB 10.3*  HCT 33.0*  PLT 178  CREATININE 0.92    Estimated Creatinine Clearance: 116.1 mL/min (by C-G formula based on Cr of 0.92).   Medical History: Past Medical History  Diagnosis Date  . Diabetes mellitus without complication ([place])   . Hypertension   . Metastatic carcinoma involving liver with unknown primary site [facility])   . Hyperlipidemia      Assessment: 53 yoM with colorectal cancer currently undergoing chemotherapy (FOLFOX q14d) presents with weakness, hypotension. Lactic acid elevated. Code sepsis called. Vanc 2g and Zosyn 3.375g x 1 given in ED with fluid boluses and pressors started.  CT chest shows new central and segmental bilateral pulmonary emboli with equivocal suggestion of right heart strain due to submassive (intermediate risk) PE.  Pharmacy consulted to start heparin infusion.  Not on any anticoagulants PTA.  CBC: Hgb 10.3, Plts 178, WBC 1.4 (ANC 500) Renal: SCr 0.92, CrCl>100 ml/min  Goal of Therapy:  Heparin level 0.3-0.7 units/ml Monitor platelets by anticoagulation protocol: Yes   Plan:  1.  Baseline anticoagulation labs ordered. 2.  Heparin 3000 unit IV bolus x 1 then start infusion at 1750 units/hr per [place]. 3.  Check heparin level in 6 hours. 4.  Daily heparin level and CBC.  Hershal Coria 10/02/2015,12:51 PM

## 2015-10-02 NOTE — ED Notes (Signed)
Pt returned to room from CT scan without distress noted. 

## 2015-10-02 NOTE — ED Provider Notes (Signed)
CSN: [id number]     Arrival date & time 10/02/15  1022 History   First MD Initiated Contact with Patient 10/02/15 1029     Chief Complaint  Patient presents with  . Weakness     (Consider location/radiation/quality/duration/timing/severity/associated sxs/prior Treatment) Patient is a 59 y.o. male presenting with general illness. The history is provided by the patient.  Illness Severity:  Severe Onset quality:  Sudden Duration:  2 days Timing:  Constant Progression:  Worsening Chronicity:  New Associated symptoms: fatigue and fever (subjective)   Associated symptoms: no abdominal pain, no chest pain, no congestion, no diarrhea, no headaches, no myalgias, no rash, no shortness of breath and no vomiting     59 yo M with a chief complaint of generalized weakness. This been going on since yesterday. Patient has a history of colon cancer is currently undergoing chemotherapy last was done on Tuesday this week. This was 6 days ago. Patient was subjective fevers and chills at home. Denies cough congestion abdominal pain dysuria diarrhea. Patient with generalized weakness is had difficulty sitting up and moving around at baseline and feels like this is mildly worse. Noted edema to bilateral lower extremities. Left greater than right. Patient feels like that is new for him. Denies history of prior blood clots.  Past Medical History  Diagnosis Date  . Diabetes mellitus without complication ([place])   . Hypertension   . Metastatic carcinoma involving liver with unknown primary site [facility])   . Hyperlipidemia    Past Surgical History  Procedure Laterality Date  . I&d extremity Left 06/19/2013    Procedure: IRRIGATION AND DEBRIDEMENT EXTREMITY;  Surgeon: Newt Minion, MD;  Location: [place];  Service: Orthopedics;  Laterality: Left;  . I&d extremity Left 07/02/2013    Procedure: IRRIGATION AND DEBRIDEMENT EXTREMITY with placement of antibiotic beads and application of wound vac;  Surgeon: Newt Minion,  MD;  Location: [place];  Service: Orthopedics;  Laterality: Left;  Irrigation and Debridement Left Foot, Place Beads, Wound VAC  . Amputation Left 01/08/2014    Procedure: AMPUTATION RAY;  Surgeon: Newt Minion, MD;  Location: [place];  Service: Orthopedics;  Laterality: Left;  Left Foot 5th Ray Amputation  . Ercp N/A 07/14/2015    Procedure: ENDOSCOPIC RETROGRADE CHOLANGIOPANCREATOGRAPHY (ERCP);  Surgeon: Ladene Artist, MD;  Location: [facility];  Service: Endoscopy;  Laterality: N/A;   Family History  Problem Relation Age of Onset  . Heart failure Mother   . Heart attack Mother   . Leukemia Father   . Colon cancer Neg Hx    Social History  Substance Use Topics  . Smoking status: Never Smoker   . Smokeless tobacco: Never Used  . Alcohol Use: 0.0 oz/week    0 Standard drinks or equivalent per week     Comment: social drinker    Review of Systems  Constitutional: Positive for fever (subjective) and fatigue. Negative for chills.  HENT: Negative for congestion and facial swelling.   Eyes: Negative for discharge and visual disturbance.  Respiratory: Negative for shortness of breath.   Cardiovascular: Negative for chest pain and palpitations.  Gastrointestinal: Negative for vomiting, abdominal pain and diarrhea.  Musculoskeletal: Negative for myalgias and arthralgias.  Skin: Negative for color change and rash.  Neurological: Positive for weakness (generalized). Negative for tremors, syncope and headaches.  Psychiatric/Behavioral: Negative for confusion and dysphoric mood.      Allergies  Review of patient's allergies indicates no known allergies.  Home Medications   Prior  to Admission medications   Medication Sig Start Date End Date Taking? Authorizing Provider  diphenoxylate-atropine (LOMOTIL) 2.5-0.025 MG per tablet Take 1 tablet by mouth 4 (four) times daily as needed for diarrhea or loose stools. 09/27/15  Yes Owens Shark, NP  ferrous sulfate 325 (65 FE) MG tablet Take 325  mg by mouth 2 (two) times daily with a meal.   Yes Historical Provider, MD  lidocaine-prilocaine (EMLA) cream Apply small amount of cream over port area 1-2 hours prior to treatment and cover with plastic wrap.  DO NOT RUB IN 07/26/15  Yes Ladell Pier, MD  ondansetron (ZOFRAN) 8 MG tablet Take 1 tablet (8 mg total) by mouth every 8 (eight) hours as needed for nausea or vomiting. 07/26/15  Yes Ladell Pier, MD  potassium chloride (KLOR-CON) 20 MEQ packet Take 20 mEq by mouth daily. Patient taking differently: Take 40 mEq by mouth daily.  09/13/15  Yes Owens Shark, NP  PRESCRIPTION MEDICATION Chemo at [facility]   Yes Historical Provider, MD  prochlorperazine (COMPAZINE) 10 MG tablet TAKE 1 TABLET (10 MG TOTAL) BY MOUTH EVERY 6 (SIX) HOURS AS NEEDED FOR NAUSEA OR VOMITING. 09/23/15  Yes Ladell Pier, MD  traMADol (ULTRAM) 50 MG tablet Take 1 tablet (50 mg total) by mouth every 6 (six) hours as needed. Patient taking differently: Take 50 mg by mouth every 6 (six) hours as needed for moderate pain.  07/19/15  Yes Theodis Blaze, MD  potassium chloride SA (K-DUR,KLOR-CON) 20 MEQ tablet Take 1 tablet (20 mEq total) by mouth 2 (two) times daily. Patient not taking: Reported on 10/02/2015 09/25/15   Rolland Porter, MD   BP 117/47 mmHg  Pulse 98  Resp 20  Ht 6\' 3"  (1.905 m)  Wt 250 lb (113.399 kg)  BMI 31.25 kg/m2  SpO2 98% Physical Exam  Constitutional: He is oriented to person, place, and time. He appears well-developed and well-nourished.  Hot to touch   HENT:  Head: Normocephalic and atraumatic.  Eyes: EOM are normal. Pupils are equal, round, and reactive to light.  Neck: Normal range of motion. Neck supple. No JVD present.  Cardiovascular: Regular rhythm.  Tachycardia present.  Exam reveals no gallop and no friction rub.   No murmur heard. Pulmonary/Chest: No respiratory distress. He has no wheezes. He has rales in the right middle field and the right lower field.  No noted breath sounds in the  right lower lung fields.  Abdominal: He exhibits no distension. There is no tenderness. There is no rebound and no guarding.  Musculoskeletal: Normal range of motion. He exhibits edema. He exhibits no tenderness.  4+ pitting edema. Left greater than right.  Neurological: He is alert and oriented to person, place, and time.  Generalized weakness without focality  Skin: No rash noted. No pallor.  Psychiatric: He has a normal mood and affect. His behavior is normal.  Nursing note and vitals reviewed.   ED Course  Procedures (including critical care time) Labs Review Labs Reviewed  COMPREHENSIVE METABOLIC PANEL - Abnormal; Notable for the following:    CO2 19 (*)    Calcium 7.9 (*)    Total Protein 5.4 (*)    Albumin 1.8 (*)    ALT 9 (*)    Alkaline Phosphatase 258 (*)    Total Bilirubin 1.6 (*)    All other components within normal limits  CBC WITH DIFFERENTIAL/PLATELET - Abnormal; Notable for the following:    WBC 1.4 (*)  RBC 3.70 (*)    Hemoglobin 10.3 (*)    HCT 33.0 (*)    RDW 18.1 (*)    Neutro Abs 0.5 (*)    Lymphs Abs 0.3 (*)    All other components within normal limits  PROTIME-INR - Abnormal; Notable for the following:    Prothrombin Time 19.8 (*)    INR 1.68 (*)    All other components within normal limits  URINALYSIS, ROUTINE W REFLEX MICROSCOPIC (NOT AT [facility]) - Abnormal; Notable for the following:    Color, Urine ORANGE (*)    APPearance CLOUDY (*)    Bilirubin Urine MODERATE (*)    Urobilinogen, UA 2.0 (*)    Leukocytes, UA TRACE (*)    All other components within normal limits  HEPARIN LEVEL (UNFRACTIONATED) - Abnormal; Notable for the following:    Heparin Unfractionated <0.10 (*)    All other components within normal limits  URINE MICROSCOPIC-ADD ON - Abnormal; Notable for the following:    Squamous Epithelial / LPF FEW (*)    Bacteria, UA FEW (*)    Casts HYALINE CASTS (*)    All other components within normal limits  I-STAT CG4 LACTIC ACID, ED -  Abnormal; Notable for the following:    Lactic Acid, Venous 3.03 (*)    All other components within normal limits  I-STAT CG4 LACTIC ACID, ED - Abnormal; Notable for the following:    Lactic Acid, Venous 2.23 (*)    All other components within normal limits  CULTURE, BLOOD (ROUTINE X 2)  CULTURE, BLOOD (ROUTINE X 2)  APTT  CBC WITH DIFFERENTIAL/PLATELET    Imaging Review Ct Angio Chest Pe W/cm &/or Wo Cm  10/02/2015   CLINICAL DATA:  Weakness. Hypotension. 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 Has htn, dm.  EXAM: CT ANGIOGRAPHY CHEST WITH CONTRAST  TECHNIQUE: Multidetector CT imaging of the chest was performed using the standard protocol during bolus administration of intravenous contrast. Multiplanar CT image reconstructions and MIPs were obtained to evaluate the vascular anatomy.  CONTRAST:  148mL OMNIPAQUE IOHEXOL 350 MG/ML SOLN  COMPARISON:  07/14/2015  FINDINGS: Contrast injection via right IJ power port. RV/LV ratio 1.05, borderline increased. However, the RV is less dilated than seen on prior study. There is a saddle embolus at the bifurcation of the main pulmonary artery with central emboli in the right upper lobe pulmonary artery and left lower lobe pulmonary artery branches, and more peripheral segmental emboli in lingular, left upper lobe, and right lower lobe branches.  Moderate right and tiny left pleural effusions.  Progressive sub carinal adenopathy. Subcentimeter precarinal node and small anterior mediastinal nodes. No definite hilar adenopathy.  12 mm lingular pulmonary nodule. Multiple left lower lobe pulmonary nodules, largest in the lateral basal segment measured and 16 mm, increased from previous exam. Dependent atelectasis in the right lower lobe, with several pulmonary nodules still conspicuous. Multiple small pulmonary nodules in both upper lobes largely stable. Thoracic spine and sternum intact. Multiple large hepatic  metastases. There is a large amount of abdominal ascites, significantly increased from previous exam.  Review of the MIP images confirms the above findings.  IMPRESSION: 1. New central and segmental bilateral pulmonary emboli with equivocal suggestion of right heart strain due to submassive (intermediate risk) PE. 2. Large amount of abdominal ascites, significantly increased from prior study. 3. Progression of pulmonary and mediastinal metastatic disease.  Critical Value/emergent results were called by telephone at the time of interpretation  on 10/02/2015 at 12:37 pm to Dr. Deno Etienne , who verbally acknowledged these results.   Electronically Signed   By: Lucrezia Europe M.D.   On: 10/02/2015 12:38   Dg Chest Port 1 View  10/02/2015   CLINICAL DATA:  Weakness.  Metastatic cancer.  EXAM: PORTABLE CHEST 1 VIEW  COMPARISON:  Chest CT 07/14/2015  FINDINGS: Port-A-Cath tip in the SVC.  Hypoventilation with decreased lung volume. Right lower lobe atelectasis. Mild left lower lobe atelectasis also noted.  Multiple lung nodules are noted on the prior CT. These are small and not well seen on the chest x-ray.  IMPRESSION: Hypoventilation with bibasilar atelectasis. Multiple lung nodules noted from prior chest CT.   Electronically Signed   By: Franchot Gallo M.D.   On: 10/02/2015 11:47   I have personally reviewed and evaluated these images and lab results as part of my medical decision-making.   EKG Interpretation   Date/Time:  Sunday October 02 2015 10:51:59 EDT Ventricular Rate:  110 PR Interval:  154 QRS Duration: 99 QT Interval:  345 QTC Calculation: 467 R Axis:   26 Text Interpretation:  Sinus tachycardia Low voltage, extremity leads No  significant change since last tracing Confirmed by Yuvin Bussiere MD, DANIEL  [phone]) on 10/02/2015 11:17:02 AM        EMERGENCY DEPARTMENT [place] CARDIAC EXAM "Study: Limited Ultrasound of the heart and pericardium"  INDICATIONS:Hypotension Multiple views of the heart and  pericardium are obtained with a multi-frequency probe.  PERFORMED IR:JJOACZ  IMAGES ARCHIVED?: Yes  FINDINGS: No pericardial effusion, Hyperdynamic contractility, IVC dilated and Tamponade physiology absent  LIMITATIONS:  Body habitus  VIEWS USED: Subcostal 4 chamber, Parasternal long axis and Inferior Vena Cava  INTERPRETATION: Cardiac activity present, Pericardial effusioin absent, Cardiac tamponade absent, Probable elevated CVP and Increased contractility  COMMENT:  Dilated RV, concern for PE  MDM   Final diagnoses:  Acute saddle pulmonary embolism with acute cor pulmonale ([place])    59 yo M with a chief complaint of generalized weakness.  Patient appears ill on exam, though maybe chronically so.  Tachycardic into the 120s, feels hot to touch.  Was hypotensive per ems prior to arrival.  Likely septic, concern for neutropenia.  Code sepsis initiated, no noted focal source by hx, will start vanc, zosyn. 30cc/kg of fluids would be 3.5L, patient appears to be generally fluid overloaded.  Will start with 2L and reassess.   That that ultrasound concerning for dilated IVC. She also found to have enlarged right ventricle. Concern for PE. CT PE study with multiple PEs. Patient's blood pressure improved with NE.  Discussed case with crit care, will admit.   The patients results and plan were reviewed and discussed.   Any x-rays performed were independently reviewed by myself.   Differential diagnosis were considered with the presenting HPI.  CRITICAL CARE Performed by: Cecilio Asper   Total critical care time: 40 min  Critical care time was exclusive of separately billable procedures and treating other patients.  Critical care was necessary to treat or prevent imminent or life-threatening deterioration.  Critical care was time spent personally by me on the following activities: development of treatment plan with patient and/or surrogate as well as nursing, discussions with  consultants, evaluation of patient's response to treatment, examination of patient, obtaining history from patient or surrogate, ordering and performing treatments and interventions, ordering and review of laboratory studies, ordering and review of radiographic studies, pulse oximetry and re-evaluation of patient's condition.   Medications  norepinephrine (LEVOPHED) 4 mg in dextrose 5 % 250 mL (0.016 mg/mL) infusion (0 mcg/min Intravenous Stopped 10/02/15 1359)  heparin ADULT infusion 100 units/mL (25000 units/250 mL) (1,750 Units/hr Intravenous New Bag/Given 10/02/15 1431)  0.9 %  sodium chloride infusion (100 mL/hr Intravenous New Bag/Given 10/02/15 1436)  traMADol (ULTRAM) tablet 50 mg (not administered)  fentaNYL (SUBLIMAZE) injection 50 mcg (not administered)  ondansetron (ZOFRAN) injection 4 mg (not administered)  vancomycin (VANCOCIN) 2,000 mg in sodium chloride 0.9 % 500 mL IVPB (0 mg Intravenous Stopped 10/02/15 1437)  piperacillin-tazobactam (ZOSYN) IVPB 3.375 g (0 g Intravenous Stopped 10/02/15 1122)  sodium chloride 0.9 % bolus 2,000 mL (0 mLs Intravenous Stopped 10/02/15 1200)  ondansetron (ZOFRAN) injection 4 mg (4 mg Intravenous Given 10/02/15 1110)  iohexol (OMNIPAQUE) 350 MG/ML injection 100 mL (100 mLs Intravenous Contrast Given 10/02/15 1208)  ondansetron (ZOFRAN) injection 4 mg (4 mg Intravenous Given 10/02/15 1239)  heparin bolus via infusion 3,000 Units (3,000 Units Intravenous Given 10/02/15 1434)    Filed Vitals:   10/02/15 1430 10/02/15 1445 10/02/15 1500 10/02/15 1501  BP: 114/69 115/67 108/59 117/47  Pulse: 97 98  98  Resp: 22 20 21 20   Height:      Weight:      SpO2: 96% 96%  98%    Final diagnoses:  Acute saddle pulmonary embolism with acute cor pulmonale (HCC)    Admission/ observation were discussed with the admitting physician, patient and/or family and they are comfortable with the plan.     Deno Etienne, DO 10/02/15 1505

## 2015-10-02 NOTE — ED Notes (Signed)
Pt noted desating to 88-89%RA. Pt started on O2 at 2lpm via [place].

## 2015-10-02 NOTE — ED Notes (Signed)
Dr Tyrone Nine notified of WBC 1.4

## 2015-10-02 NOTE — Progress Notes (Signed)
Utilization Review Completed.Jaime [name] T10/01/2015  

## 2015-10-02 NOTE — ED Notes (Signed)
US at bedside

## 2015-10-02 NOTE — ED Notes (Signed)
MD notified of lactic acid value

## 2015-10-02 NOTE — ED Notes (Signed)
Patient transported to CT 

## 2015-10-02 NOTE — ED Notes (Signed)
Bed: RESA Expected date:  Expected time:  Means of arrival:  Comments: CA pt-hypotension

## 2015-10-02 NOTE — ED Notes (Signed)
Dr. Tyrone Nine aware of BP with order to stop Levophed.

## 2015-10-02 NOTE — Progress Notes (Signed)
VASCULAR LAB PRELIMINARY  PRELIMINARY  PRELIMINARY  PRELIMINARY  Bilateral lower extremity venous duplex completed.    Preliminary report:  There is extensive, occlusive, acute DVT throughout the bilateral lower extremities, beginning in the posterior tibial vein and the peroneal vein and coursing through to the popliteal, femoral, and common femoral veins.   Latriece Anstine, RVT 10/02/2015, 3:17 PM

## 2015-10-02 NOTE — ED Notes (Signed)
Pt arrived via EMS with report of generalized weakness and trouble moving lower ext. EMS reported hypotensive on scene BP-75/69 and en-route 115/P after trendelenburg position and ST with PAC's. Pt reported last chemotherapy was Tuesday. Pt denies SHOB or coughing. Lt leg larger than rt leg with swelling noted.

## 2015-10-02 NOTE — H&P (Signed)
PULMONARY / CRITICAL CARE MEDICINE   Name: Jaime [name] MRN: [id number] DOB: Jan 10, 1956    ADMISSION DATE:  10/02/2015  REFERRING MD :  ER  CHIEF COMPLAINT:  Weak  INITIAL PRESENTATION:  59 yo male presented with weakness, Lt > Rt leg swelling, tachycardia, hypotension from acute pulmonary embolism.  He has been tx for Stage IV adenocarcinoma of colon with FOLFOX and Avastin (added 08/30/15) >> dx 07/21/15.  STUDIES:  10/02 CT chest >> saddle PE, RV:LV ratio 1.05, mod Rt and small Lt pleural effusion, progressive subcarinal LAN, 12 mm lingular nodule, multiple LLL nodules up to 16 mm, several RLL nodules, multiple large hepatic metastatic lesions, ascites increased  SIGNIFICANT EVENTS: 10/02 Admit, heparin gtt   HISTORY OF PRESENT ILLNESS:   59 yo male was dx in July 2016 with Stage IV colon cancer.  He was started on chemo with FOLFOX.  He had Avastin added 08/30/15.  His last chemo was about 3 weeks prior to admission.  He normally has leg swelling, but noticed much more swelling in his Lt leg for the past 2 weeks.  Over the past 2 days he was feeling weaker, and couldn't get out of bed.  As a result his wife brought him to the ER.  He was noted to be tachycardic and hypotension.  He was given Abx with initial concern for sepsis >> no obvious source of infection.  He eventually had CT chest which showed saddle PE.  He was started on levophed while in ER for hypotension.  He did not notice dyspnea or chest pain.  There is no prior hx of thrombo-embolic disease.  He has been feeling nauseous, but this usually happens after his chemo.  PAST MEDICAL HISTORY :   has a past medical history of Diabetes mellitus without complication ([place]); Hypertension; Metastatic carcinoma involving liver with unknown primary site [employer]); and Hyperlipidemia.    has past surgical history that includes I&D extremity (Left, 06/19/2013); I&D extremity (Left, 07/02/2013); Amputation (Left, 01/08/2014); and ERCP (N/A,  07/14/2015).   Prior to Admission medications   Medication Sig Start Date End Date Taking? Authorizing Provider  diphenoxylate-atropine (LOMOTIL) 2.5-0.025 MG per tablet Take 1 tablet by mouth 4 (four) times daily as needed for diarrhea or loose stools. 09/27/15  Yes Owens Shark, NP  ferrous sulfate 325 (65 FE) MG tablet Take 325 mg by mouth 2 (two) times daily with a meal.   Yes Historical Provider, MD  lidocaine-prilocaine (EMLA) cream Apply small amount of cream over port area 1-2 hours prior to treatment and cover with plastic wrap.  DO NOT RUB IN 07/26/15  Yes Ladell Pier, MD  ondansetron (ZOFRAN) 8 MG tablet Take 1 tablet (8 mg total) by mouth every 8 (eight) hours as needed for nausea or vomiting. 07/26/15  Yes Ladell Pier, MD  potassium chloride (KLOR-CON) 20 MEQ packet Take 20 mEq by mouth daily. Patient taking differently: Take 40 mEq by mouth daily.  09/13/15  Yes Owens Shark, NP  PRESCRIPTION MEDICATION Chemo at [facility]   Yes Historical Provider, MD  prochlorperazine (COMPAZINE) 10 MG tablet TAKE 1 TABLET (10 MG TOTAL) BY MOUTH EVERY 6 (SIX) HOURS AS NEEDED FOR NAUSEA OR VOMITING. 09/23/15  Yes Ladell Pier, MD  traMADol (ULTRAM) 50 MG tablet Take 1 tablet (50 mg total) by mouth every 6 (six) hours as needed. Patient taking differently: Take 50 mg by mouth every 6 (six) hours as needed for moderate pain.  07/19/15  Yes  Theodis Blaze, MD  potassium chloride SA (K-DUR,KLOR-CON) 20 MEQ tablet Take 1 tablet (20 mEq total) by mouth 2 (two) times daily. Patient not taking: Reported on 10/02/2015 09/25/15   Rolland Porter, MD   No Known Allergies  FAMILY HISTORY:  indicated that his mother is deceased. He indicated that his father is deceased.  SOCIAL HISTORY:  reports that he has never smoked. He has never used smokeless tobacco. He reports that he drinks alcohol. He reports that he does not use illicit drugs.  REVIEW OF SYSTEMS:   Negative except above  SUBJECTIVE:   VITAL  SIGNS: Pulse Rate:  [100-110] 102 (10/02 1400) Resp:  [0-25] 20 (10/02 1400) BP: (72-153)/(48-88) 129/65 mmHg (10/02 1400) SpO2:  [90 %-97 %] 97 % (10/02 1400) Weight:  [250 lb (113.399 kg)] 250 lb (113.399 kg) (10/02 1042) INTAKE / OUTPUT: No intake or output data in the 24 hours ending 10/02/15 1413  PHYSICAL EXAMINATION: General: pleasant Neuro:  Alert, normal strength, CN intact HEENT:  Pupils reactive, no oral exudate Cardiovascular:  Regular, tachycardic, no murmur Lungs:  Decreased BS at bases, no wheeze Abdomen:  Soft, mild distention, non tender Musculoskeletal:  Lt >> Rt swelling Skin:  Port site RT upper chest clean, no rashes  LABS:  CBC  Recent Labs Lab 09/27/15 0852 10/02/15 1045  WBC 2.5* 1.4*  HGB 10.6* 10.3*  HCT 34.6* 33.0*  PLT 195 178   Coag's No results for input(s): APTT, INR in the last 168 hours.   BMET  Recent Labs Lab 09/27/15 0852 10/02/15 1045  NA 140 139  K 4.1 3.7  CL  --  110  CO2 23 19*  BUN 13.0 17  CREATININE 0.8 0.92  GLUCOSE 99 90   Electrolytes  Recent Labs Lab 09/27/15 0852 10/02/15 1045  CALCIUM 8.2* 7.9*   Sepsis Markers  Recent Labs Lab 10/02/15 1101 10/02/15 1405  LATICACIDVEN 3.03* 2.23*   Liver Enzymes  Recent Labs Lab 09/27/15 0852 10/02/15 1045  AST 18 26  ALT <9 9*  ALKPHOS 322* 258*  BILITOT 1.84* 1.6*  ALBUMIN 2.1* 1.8*   Cardiac Enzymes No results for input(s): TROPONINI, PROBNP in the last 168 hours.   Glucose No results for input(s): GLUCAP in the last 168 hours.  Imaging Ct Angio Chest Pe W/cm &/or Wo Cm  10/02/2015   CLINICAL DATA:  Weakness. Hypotension. 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 Has htn, dm.  EXAM: CT ANGIOGRAPHY CHEST WITH CONTRAST  TECHNIQUE: Multidetector CT imaging of the chest was performed using the standard protocol during bolus administration of intravenous contrast. Multiplanar CT image reconstructions and  MIPs were obtained to evaluate the vascular anatomy.  CONTRAST:  156mL OMNIPAQUE IOHEXOL 350 MG/ML SOLN  COMPARISON:  07/14/2015  FINDINGS: Contrast injection via right IJ power port. RV/LV ratio 1.05, borderline increased. However, the RV is less dilated than seen on prior study. There is a saddle embolus at the bifurcation of the main pulmonary artery with central emboli in the right upper lobe pulmonary artery and left lower lobe pulmonary artery branches, and more peripheral segmental emboli in lingular, left upper lobe, and right lower lobe branches.  Moderate right and tiny left pleural effusions.  Progressive sub carinal adenopathy. Subcentimeter precarinal node and small anterior mediastinal nodes. No definite hilar adenopathy.  12 mm lingular pulmonary nodule. Multiple left lower lobe pulmonary nodules, largest in the lateral basal segment measured and 16 mm, increased from previous exam.  Dependent atelectasis in the right lower lobe, with several pulmonary nodules still conspicuous. Multiple small pulmonary nodules in both upper lobes largely stable. Thoracic spine and sternum intact. Multiple large hepatic metastases. There is a large amount of abdominal ascites, significantly increased from previous exam.  Review of the MIP images confirms the above findings.  IMPRESSION: 1. New central and segmental bilateral pulmonary emboli with equivocal suggestion of right heart strain due to submassive (intermediate risk) PE. 2. Large amount of abdominal ascites, significantly increased from prior study. 3. Progression of pulmonary and mediastinal metastatic disease.  Critical Value/emergent results were called by telephone at the time of interpretation on 10/02/2015 at 12:37 pm to Dr. Deno Etienne , who verbally acknowledged these results.   Electronically Signed   By: Lucrezia Europe M.D.   On: 10/02/2015 12:38   Dg Chest Port 1 View  10/02/2015   CLINICAL DATA:  Weakness.  Metastatic cancer.  EXAM: PORTABLE CHEST 1  VIEW  COMPARISON:  Chest CT 07/14/2015  FINDINGS: Port-A-Cath tip in the SVC.  Hypoventilation with decreased lung volume. Right lower lobe atelectasis. Mild left lower lobe atelectasis also noted.  Multiple lung nodules are noted on the prior CT. These are small and not well seen on the chest x-ray.  IMPRESSION: Hypoventilation with bibasilar atelectasis. Multiple lung nodules noted from prior chest CT.   Electronically Signed   By: Franchot Gallo M.D.   On: 10/02/2015 11:47     ASSESSMENT / PLAN:  Acute pulmonary embolism with acute hypoxic respiratory failure. Discussion: His risk factors are colon cancer, and avastin.  In setting of Stage IV colon cancer with lung/liver mets he is not a candidate for thrombolytic therapy >> risk of bleeding too high. Plan: - will admit to ICU - continue IV fluid - wean off pressors to keep MAP > 65 - check doppler legs and then decide if he needs IVC filter placed - check Echo - oxygen to keep SpO2 > 92%  Stage IV colon cancer. Plan: - will need to call Dr. Benay Spice on 10/03 to discuss anti-coagulation regimen and long term tx options colon cancer  Neutropenia, anemia likely related to chemotherapy. Plan: - f/u CBC with diff - neutropenic precautions  Lactic acidosis in setting of acute PE >> improved. Plan: - optimize hemodynamics  Hx of HTN, DM, HLD >> all improved after wt loss in setting of colon cancer.  Updated pt's wife at bedside.  CC time 42 minutes.  Chesley Mires, MD [facility] Pulmonary/Critical Care 10/02/2015, 2:29 PM Pager:  [phone] After 3pm call: [phone]

## 2015-10-02 NOTE — ED Notes (Signed)
Dr. Tyrone Nine at bedside doing [place]. Pt tolerating well.

## 2015-10-03 ENCOUNTER — Inpatient Hospital Stay (HOSPITAL_COMMUNITY): Payer: Managed Care, Other (non HMO)

## 2015-10-03 DIAGNOSIS — I82403 Acute embolism and thrombosis of unspecified deep veins of lower extremity, bilateral: Secondary | ICD-10-CM

## 2015-10-03 DIAGNOSIS — I2609 Other pulmonary embolism with acute cor pulmonale: Secondary | ICD-10-CM

## 2015-10-03 DIAGNOSIS — I2699 Other pulmonary embolism without acute cor pulmonale: Secondary | ICD-10-CM

## 2015-10-03 DIAGNOSIS — J918 Pleural effusion in other conditions classified elsewhere: Secondary | ICD-10-CM

## 2015-10-03 DIAGNOSIS — D509 Iron deficiency anemia, unspecified: Secondary | ICD-10-CM

## 2015-10-03 DIAGNOSIS — R079 Chest pain, unspecified: Secondary | ICD-10-CM

## 2015-10-03 DIAGNOSIS — R627 Adult failure to thrive: Secondary | ICD-10-CM

## 2015-10-03 DIAGNOSIS — C787 Secondary malignant neoplasm of liver and intrahepatic bile duct: Secondary | ICD-10-CM

## 2015-10-03 DIAGNOSIS — I480 Paroxysmal atrial fibrillation: Secondary | ICD-10-CM | POA: Diagnosis not present

## 2015-10-03 DIAGNOSIS — I2602 Saddle embolus of pulmonary artery with acute cor pulmonale: Secondary | ICD-10-CM | POA: Diagnosis present

## 2015-10-03 DIAGNOSIS — R531 Weakness: Secondary | ICD-10-CM

## 2015-10-03 DIAGNOSIS — E119 Type 2 diabetes mellitus without complications: Secondary | ICD-10-CM

## 2015-10-03 DIAGNOSIS — C187 Malignant neoplasm of sigmoid colon: Secondary | ICD-10-CM

## 2015-10-03 LAB — COMPREHENSIVE METABOLIC PANEL
ALBUMIN: 1.7 g/dL — AB (ref 3.5–5.0)
ALK PHOS: 216 U/L — AB (ref 38–126)
ALT: 9 U/L — ABNORMAL LOW (ref 17–63)
AST: 22 U/L (ref 15–41)
Anion gap: 7 (ref 5–15)
BILIRUBIN TOTAL: 1.2 mg/dL (ref 0.3–1.2)
BUN: 19 mg/dL (ref 6–20)
CALCIUM: 7.4 mg/dL — AB (ref 8.9–10.3)
CO2: 22 mmol/L (ref 22–32)
CREATININE: 0.83 mg/dL (ref 0.61–1.24)
Chloride: 108 mmol/L (ref 101–111)
GFR calc Af Amer: >60 mL/min (ref >60)
GFR calc non Af Amer: >60 mL/min (ref >60)
GLUCOSE: 106 mg/dL — AB (ref 65–99)
Potassium: 3.7 mmol/L (ref 3.5–5.1)
Sodium: 137 mmol/L (ref 135–145)
TOTAL PROTEIN: 5.2 g/dL — AB (ref 6.5–8.1)

## 2015-10-03 LAB — URINE CULTURE: Culture: NO GROWTH

## 2015-10-03 LAB — CBC
HEMATOCRIT: 32.7 % — AB (ref 39.0–52.0)
HEMOGLOBIN: 10.2 g/dL — AB (ref 13.0–17.0)
MCH: 27.7 pg (ref 26.0–34.0)
MCHC: 31.2 g/dL (ref 30.0–36.0)
MCV: 88.9 fL (ref 78.0–100.0)
Platelets: 170 10*3/uL (ref 150–400)
RBC: 3.68 MIL/uL — ABNORMAL LOW (ref 4.22–5.81)
RDW: 18 % — ABNORMAL HIGH (ref 11.5–15.5)
WBC: 4.6 10*3/uL (ref 4.0–10.5)

## 2015-10-03 LAB — GLUCOSE, CAPILLARY
GLUCOSE-CAPILLARY: 107 mg/dL — AB (ref 65–99)
Glucose-Capillary: 107 mg/dL — ABNORMAL HIGH (ref 65–99)
Glucose-Capillary: 218 mg/dL — ABNORMAL HIGH (ref 65–99)

## 2015-10-03 LAB — HEPARIN LEVEL (UNFRACTIONATED)
HEPARIN UNFRACTIONATED: 0.64 [IU]/mL (ref 0.30–0.70)
Heparin Unfractionated: 0.48 IU/mL (ref 0.30–0.70)

## 2015-10-03 MED ORDER — HEPARIN (PORCINE) IN NACL 100-0.45 UNIT/ML-% IJ SOLN
1600.0000 [IU]/h | INTRAMUSCULAR | Status: DC
  Administered 2015-10-04 – 2015-10-05 (×2): 1600 [IU]/h via INTRAVENOUS
  Filled 2015-10-03 (×4): qty 250

## 2015-10-03 MED ORDER — ENSURE ENLIVE PO LIQD
237.0000 mL | Freq: Two times a day (BID) | ORAL | Status: DC
  Administered 2015-10-03 – 2015-10-12 (×19): 237 mL via ORAL

## 2015-10-03 MED ORDER — DIPHENOXYLATE-ATROPINE 2.5-0.025 MG PO TABS
1.0000 | ORAL_TABLET | Freq: Four times a day (QID) | ORAL | Status: DC | PRN
  Administered 2015-10-03 – 2015-10-11 (×9): 1 via ORAL
  Filled 2015-10-03 (×9): qty 1

## 2015-10-03 MED ORDER — SODIUM CHLORIDE 0.9 % IV BOLUS (SEPSIS)
500.0000 mL | Freq: Once | INTRAVENOUS | Status: AC
  Administered 2015-10-03: 500 mL via INTRAVENOUS

## 2015-10-03 MED ORDER — SODIUM CHLORIDE 0.9 % IJ SOLN
10.0000 mL | INTRAMUSCULAR | Status: DC | PRN
  Administered 2015-10-05 – 2015-10-12 (×5): 10 mL
  Filled 2015-10-03 (×5): qty 40

## 2015-10-03 NOTE — Progress Notes (Signed)
PULMONARY / CRITICAL CARE MEDICINE   Name: Jaime [name] MRN: [id number] DOB: 12/20/1956    ADMISSION DATE:  10/02/2015  REFERRING MD :  ER  CHIEF COMPLAINT:  Weak  INITIAL PRESENTATION:  59 yo male presented with weakness, Lt > Rt leg swelling, tachycardia, hypotension from acute pulmonary embolism.  He has been tx for Stage IV adenocarcinoma of colon with FOLFOX and Avastin (added 08/30/15) >> dx 07/21/15.  STUDIES:  10/02 CT chest >> saddle PE, RV:LV ratio 1.05, mod Rt and small Lt pleural effusion, progressive subcarinal LAN, 12 mm lingular nodule, multiple LLL nodules up to 16 mm, several RLL nodules, multiple large hepatic metastatic lesions, ascites increased 10/2 venous US: extensive, occlusive, acute DVT throughout the bilateral lower extremities, beginning in the posterior tibial vein and the peroneal vein and coursing through to the popliteal, femoral, and common femoral veins.  ECHO 10/2: >>>  SIGNIFICANT EVENTS: 10/02 Admit, heparin gtt 10/2 lactic acidosis cleared  10/3 feels better. Ready for x fer to floor   SUBJECTIVE:  No distress.  VITAL SIGNS: Temp:  [97.5 F (36.4 C)-98.2 F (36.8 C)] 97.5 F (36.4 C) (10/03 0800) Pulse Rate:  [70-110] 80 (10/03 0800) Resp:  [0-26] 26 (10/03 0800) BP: (72-172)/(47-94) 172/94 mmHg (10/03 0800) SpO2:  [90 %-100 %] 98 % (10/03 0800) Weight:  [113.399 kg (250 lb)-122.7 kg (270 lb 8.1 oz)] 122.7 kg (270 lb 8.1 oz) (10/02 1558) 2 liters   OUTPUT:  Intake/Output Summary (Last 24 hours) at 10/03/15 0851 Last data filed at 10/03/15 0800  Gross per 24 hour  Intake   2595 ml  Output    250 ml  Net   2345 ml    PHYSICAL EXAMINATION: General: pleasant Neuro:  Alert, normal strength, CN intact HEENT:  Pupils reactive, no oral exudate Cardiovascular:  Regular, tachycardic, no murmur Lungs:  Decreased BS at bases, no wheeze, no accessory muscle use  Abdomen:  Soft, mild distention, non tender Musculoskeletal:  Lt >> Rt  swelling Skin:  Port site RT upper chest clean, no rashes  LABS:  CBC  Recent Labs Lab 09/27/15 0852 10/02/15 1045 10/02/15 1624  WBC 2.5* 1.4* 2.9*  HGB 10.6* 10.3* 9.8*  HCT 34.6* 33.0* 31.6*  PLT 195 178 158   Coag's  Recent Labs Lab 10/02/15 1045  APTT 34  INR 1.68*     BMET  Recent Labs Lab 09/27/15 0852 10/02/15 1045 10/03/15 0535  NA 140 139 137  K 4.1 3.7 3.7  CL  --  110 108  CO2 23 19* 22  BUN 13.0 17 19  CREATININE 0.8 0.92 0.83  GLUCOSE 99 90 106*   Electrolytes  Recent Labs Lab 09/27/15 0852 10/02/15 1045 10/03/15 0535  CALCIUM 8.2* 7.9* 7.4*   Sepsis Markers  Recent Labs Lab 10/02/15 1101 10/02/15 1405  LATICACIDVEN 3.03* 2.23*   Liver Enzymes  Recent Labs Lab 09/27/15 0852 10/02/15 1045 10/03/15 0535  AST 18 26 22   ALT <9 9* 9*  ALKPHOS 322* 258* 216*  BILITOT 1.84* 1.6* 1.2  ALBUMIN 2.1* 1.8* 1.7*   Cardiac Enzymes No results for input(s): TROPONINI, PROBNP in the last 168 hours.   Glucose  Recent Labs Lab 10/02/15 1623 10/03/15 0746  GLUCAP 98 107*    Imaging Ct Angio Chest Pe W/cm &/or Wo Cm  10/02/2015   CLINICAL DATA:  Weakness. Hypotension. 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 Has htn, dm.  EXAM: CT ANGIOGRAPHY CHEST WITH  CONTRAST  TECHNIQUE: Multidetector CT imaging of the chest was performed using the standard protocol during bolus administration of intravenous contrast. Multiplanar CT image reconstructions and MIPs were obtained to evaluate the vascular anatomy.  CONTRAST:  18mL OMNIPAQUE IOHEXOL 350 MG/ML SOLN  COMPARISON:  07/14/2015  FINDINGS: Contrast injection via right IJ power port. RV/LV ratio 1.05, borderline increased. However, the RV is less dilated than seen on prior study. There is a saddle embolus at the bifurcation of the main pulmonary artery with central emboli in the right upper lobe pulmonary artery and left lower lobe pulmonary artery  branches, and more peripheral segmental emboli in lingular, left upper lobe, and right lower lobe branches.  Moderate right and tiny left pleural effusions.  Progressive sub carinal adenopathy. Subcentimeter precarinal node and small anterior mediastinal nodes. No definite hilar adenopathy.  12 mm lingular pulmonary nodule. Multiple left lower lobe pulmonary nodules, largest in the lateral basal segment measured and 16 mm, increased from previous exam. Dependent atelectasis in the right lower lobe, with several pulmonary nodules still conspicuous. Multiple small pulmonary nodules in both upper lobes largely stable. Thoracic spine and sternum intact. Multiple large hepatic metastases. There is a large amount of abdominal ascites, significantly increased from previous exam.  Review of the MIP images confirms the above findings.  IMPRESSION: 1. New central and segmental bilateral pulmonary emboli with equivocal suggestion of right heart strain due to submassive (intermediate risk) PE. 2. Large amount of abdominal ascites, significantly increased from prior study. 3. Progression of pulmonary and mediastinal metastatic disease.  Critical Value/emergent results were called by telephone at the time of interpretation on 10/02/2015 at 12:37 pm to Dr. Deno Etienne , who verbally acknowledged these results.   Electronically Signed   By: Lucrezia Europe M.D.   On: 10/02/2015 12:38   Dg Chest Port 1 View  10/02/2015   CLINICAL DATA:  Weakness.  Metastatic cancer.  EXAM: PORTABLE CHEST 1 VIEW  COMPARISON:  Chest CT 07/14/2015  FINDINGS: Port-A-Cath tip in the SVC.  Hypoventilation with decreased lung volume. Right lower lobe atelectasis. Mild left lower lobe atelectasis also noted.  Multiple lung nodules are noted on the prior CT. These are small and not well seen on the chest x-ray.  IMPRESSION: Hypoventilation with bibasilar atelectasis. Multiple lung nodules noted from prior chest CT.   Electronically Signed   By: Franchot Gallo  M.D.   On: 10/02/2015 11:47     ASSESSMENT / PLAN:  Acute pulmonary embolism with acute hypoxic respiratory failure. Discussion: His risk factors are colon cancer, and avastin.  In setting of Stage IV colon cancer with lung/liver mets was not a candidate for thrombolytic therapy >> risk of bleeding too high. Plan: - KVO IVFs - f/u Echo - oxygen to keep SpO2 > 92%  Stage IV colon cancer. Plan: - will need to call Dr. Benay Spice on 10/03 to discuss anti-coagulation regimen and long term tx options colon cancer  Neutropenia, anemia likely related to chemotherapy. Plan: - f/u CBC with diff - neutropenic precautions   Hx of HTN, DM, HLD >> all improved after wt loss in setting of colon cancer.  Updated pt's wife at bedside. Going to tele. Ask triad to assume care. Begin PT tomorrow.   Erick Colace ACNP-BC [place] Pager # [phone] OR # [phone] if no answer

## 2015-10-03 NOTE — Progress Notes (Signed)
IP PROGRESS NOTE  Subjective:   Jaime [name] is well-known to me with a history of metastatic colon cancer. Schedule chemotherapy was held last week secondary to neutropenia. He presented to the emergency room yesterday with increased "weakness ". There was a concern for sepsis and he was started on broad-spectrum antibiotics. A cardiac ultrasound revealed evidence of a dilated RV and it was concern for pulmonary embolism. A CT the chest confirmed bilateral pulmonary emboli and Dopplers of the legs revealed bilateral DVTs.  He reports feeling better today.  Objective: Vital signs in last 24 hours: Blood pressure 154/92, pulse 76, temperature 97.4 F (36.3 C), temperature source Oral, resp. rate 17, height 6\' 3"  (1.905 m), weight 270 lb 8.1 oz (122.7 kg), SpO2 100 %.  Intake/Output from previous day: 10/02 0701 - 10/03 0700 In: 2237.5 [P.O.:240; I.V.:1997.5] Out: 250 [Urine:250]  Physical Exam:  HEENT: No thrush Lungs: Clear anteriorly Cardiac: Regular rate and rhythm Abdomen: Distended, no hepatomegaly Extremities: Pitting edema throughout the legs bilaterally   Portacath/PICC-without erythema  Lab Results:  Recent Labs  10/02/15 1624 10/03/15 1625  WBC 2.9* 4.6  HGB 9.8* 10.2*  HCT 31.6* 32.7*  PLT 158 170    BMET  Recent Labs  10/02/15 1045 10/03/15 0535  NA 139 137  K 3.7 3.7  CL 110 108  CO2 19* 22  GLUCOSE 90 106*  BUN 17 19  CREATININE 0.92 0.83  CALCIUM 7.9* 7.4*    Studies/Results: Ct Angio Chest Pe W/cm &/or Wo Cm  10/02/2015   CLINICAL DATA:  Weakness. Hypotension. 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 Has htn, dm.  EXAM: CT ANGIOGRAPHY CHEST WITH CONTRAST  TECHNIQUE: Multidetector CT imaging of the chest was performed using the standard protocol during bolus administration of intravenous contrast. Multiplanar CT image reconstructions and MIPs were obtained to evaluate the vascular anatomy.   CONTRAST:  131mL OMNIPAQUE IOHEXOL 350 MG/ML SOLN  COMPARISON:  07/14/2015  FINDINGS: Contrast injection via right IJ power port. RV/LV ratio 1.05, borderline increased. However, the RV is less dilated than seen on prior study. There is a saddle embolus at the bifurcation of the main pulmonary artery with central emboli in the right upper lobe pulmonary artery and left lower lobe pulmonary artery branches, and more peripheral segmental emboli in lingular, left upper lobe, and right lower lobe branches.  Moderate right and tiny left pleural effusions.  Progressive sub carinal adenopathy. Subcentimeter precarinal node and small anterior mediastinal nodes. No definite hilar adenopathy.  12 mm lingular pulmonary nodule. Multiple left lower lobe pulmonary nodules, largest in the lateral basal segment measured and 16 mm, increased from previous exam. Dependent atelectasis in the right lower lobe, with several pulmonary nodules still conspicuous. Multiple small pulmonary nodules in both upper lobes largely stable. Thoracic spine and sternum intact. Multiple large hepatic metastases. There is a large amount of abdominal ascites, significantly increased from previous exam.  Review of the MIP images confirms the above findings.  IMPRESSION: 1. New central and segmental bilateral pulmonary emboli with equivocal suggestion of right heart strain due to submassive (intermediate risk) PE. 2. Large amount of abdominal ascites, significantly increased from prior study. 3. Progression of pulmonary and mediastinal metastatic disease.  Critical Value/emergent results were called by telephone at the time of interpretation on 10/02/2015 at 12:37 pm to Dr. Deno Etienne , who verbally acknowledged these results.   Electronically Signed   By: Lucrezia Europe M.D.   On: 10/02/2015  12:38   Dg Chest Port 1 View  10/02/2015   CLINICAL DATA:  Weakness.  Metastatic cancer.  EXAM: PORTABLE CHEST 1 VIEW  COMPARISON:  Chest CT 07/14/2015  FINDINGS:  Port-A-Cath tip in the SVC.  Hypoventilation with decreased lung volume. Right lower lobe atelectasis. Mild left lower lobe atelectasis also noted.  Multiple lung nodules are noted on the prior CT. These are small and not well seen on the chest x-ray.  IMPRESSION: Hypoventilation with bibasilar atelectasis. Multiple lung nodules noted from prior chest CT.   Electronically Signed   By: Franchot Gallo M.D.   On: 10/02/2015 11:47    Medications: I have reviewed the patient's current medications.  Assessment/Plan:  1. 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, status post a colonoscopy 07/21/2015 confirming a sigmoid colon mass  Staging CT scans 07/13/2015 and 07/14/2015 confirmed extensive liver metastases, lung nodules, a right pleural effusion, and porta hepatis lymphadenopathy  Cycle 1 FOLFOX (chemotherapy doses reduced and bolus 5-FU eliminated) 08/02/2015  Cycle 2 FOLFOX (chemotherapy doses reduced and bolus 5-FU eliminated) 08/16/2015  Cycle 3 FOLFOX (Avastin added, bolus 5-FU introduced, and infusional 5-FU dose escalated) 08/30/2015  Cycle 4 FOLFOX plus Avastin 09/13/2015  Cycle 5 held on 09/27/2015 due to mild neutropenia, recent diarrhea.  2. Obstructive jaundice, likely secondary to porta hepatis lymphadenopathy, status post placement of a bile duct stent on 07/14/2015  3. Microcytic anemia secondary to the colon mass-improved  4. Diabetes  5. Failure to thrive secondary to metastatic colon cancer  6. Port-A-Cath placement 08/01/2015  7. Nausea following cycle 1 and cycle 2 FOLFOX-emend added with cycle 3  8. Diarrhea following cycle 4 FOLFOX/Avastin.  9. Neutropenia following cycle 4 FOLFOX/Avastin. Neulasta to be added with cycle 5.  10.  Admission 10/02/2015 with bilateral DVTs and pulmonary emboli-maintained on heparin  Jaime [name] has metastatic colon cancer. He has completed 4 cycles of FOLFOX chemotherapy, he received Avastin with the last 2  cycles. His overall performance status improved following the 4 cycles of chemotherapy and the CEA was lower last week.  I discussed the current situation with Mr. Eline and his wife. He has multiple risk factors for venous thrombosis including immobility, metastatic colon cancer, and chemotherapy. He will need indefinite anticoagulation therapy. Hopefully the leg edema will improve with treatment of the DVTs.  I reviewed the chest CT images. It is difficult to compare the tumor burden in the liver to the abdomen CT from 07/13/2015, but they appear similar. The ascites is likely related to hypoalbuminemia, but he may have malignant ascites. We will discuss the indication for continuing chemotherapy with FOLFOX, switching to FOLFIRI, versus comfort care pending his clinical status over the next several days. I will review the chest CT with regard to the status of the pulmonary nodules, but several weeks elapsed between the baseline CT and initiation of FOLFOX.  Recommendations: 1. Continue heparin anticoagulation for at least 5 days, then switch to Xarelto 2. Palliative paracentesis with fluid to be sent for cytology 3. Limited IV fluids and consider a trial of furosemide for the anasarca 4. Oncology will continue following him daily in the hospital.     LOS: 1 day   [facility], [place]  10/03/2015, 5:19 PM

## 2015-10-03 NOTE — Progress Notes (Signed)
PHARMACIST - PHYSICIAN COMMUNICATION CONCERNING:  IV Heparin  61 yoM on IV heparin for new central and segmental bilateral PE with possible right heart strain.  Please seen note earlier today by Peggyann Juba, PharmD for more details.   IV heparin level tonight = 0.64 (goal 0.3-0.7) - therapeutic. Trending up quickly over 24 hours and nearing upper threshold of goal.    CBC: Stable hgb and plts.   No issues per RN.     RECOMMENDATION: Reduce heparin infusion slightly to 1600 units/hr = 16 ml/hr and recheck level in 6 hours.   Ralene Bathe, PharmD, BCPS 10/03/2015, 5:37 PM  Pager: [phone]

## 2015-10-03 NOTE — Progress Notes (Signed)
ANTICOAGULATION CONSULT NOTE - Follow Up Consult  Pharmacy Consult for Heparin Indication: pulmonary embolus  No Known Allergies  Patient Measurements: Height: 6\' 3"  (190.5 cm) Weight: 270 lb 8.1 oz (122.7 kg) IBW/kg (Calculated) : 84.5 Heparin Dosing Weight:   Vital Signs: Temp: 98.1 F (36.7 C) (10/03 0000) Temp Source: Oral (10/03 0000) BP: 122/73 mmHg (10/03 0200) Pulse Rate: 70 (10/03 0200)  Labs:  Recent Labs  10/02/15 1045 10/02/15 1350 10/02/15 1624 10/02/15 2035  HGB 10.3*  --  9.8*  --   HCT 33.0*  --  31.6*  --   PLT 178  --  158  --   APTT 34  --   --   --   LABPROT 19.8*  --   --   --   INR 1.68*  --   --   --   HEPARINUNFRC  --  <0.10*  --  0.36  CREATININE 0.92  --   --   --     Estimated Creatinine Clearance: 120.5 mL/min (by C-G formula based on Cr of 0.92).   Medications:  Infusions:  . sodium chloride 100 mL/hr (10/02/15 1436)  . heparin 1,750 Units (10/02/15 2000)  . norepinephrine (LEVOPHED) Adult infusion Stopped (10/02/15 1359)    Assessment: Patient with heparin level at goal.  No heparin issues noted.  Goal of Therapy:  Heparin level 0.3-0.7 units/ml Monitor platelets by anticoagulation protocol: Yes   Plan:  Continue heparin drip at current rate Recheck level with AM labs  Tyler Deis, Shea Stakes Crowford 10/03/2015,3:35 AM

## 2015-10-03 NOTE — Progress Notes (Signed)
Nutrition Brief Note  Patient identified on the Malnutrition Screening Tool (MST) Report  Wt Readings from Last 15 Encounters:  10/02/15 270 lb 8.1 oz (122.7 kg)  09/25/15 250 lb (113.399 kg)  09/13/15 258 lb 14.4 oz (117.436 kg)  08/30/15 247 lb 6.4 oz (112.22 kg)  07/26/15 253 lb 3.2 oz (114.851 kg)  07/21/15 253 lb (114.76 kg)  07/20/15 253 lb (114.76 kg)  07/18/15 265 lb 14.4 oz (120.611 kg)  11/04/14 245 lb (111.131 kg)  01/08/14 237 lb 7 oz (107.7 kg)  07/02/13 259 lb 7.7 oz (117.7 kg)  07/01/13 263 lb 1.6 oz (119.341 kg)  06/22/13 281 lb 1.4 oz (127.5 kg)    Body mass index is 33.81 kg/(m^2). Patient meets criteria for obesity based on current BMI.   Noted that pt has been undergoing treatment for stage 4 adenocarcinoma of the colon and currently on FOLFOX. Pt's weight has been fluctuating the past 3 months (247-270 lbs). Will order Ensure Enlive BID to supplement should intakes diminish.  Current diet order is Regular, patient is consuming approximately 100% of meals at this time. Labs and medications reviewed.   No nutrition interventions warranted at this time. If nutrition issues arise, please consult RD.      Jarome Matin, RD, LDN Inpatient Clinical Dietitian Pager # [phone] After hours/weekend pager # [phone]

## 2015-10-03 NOTE — Progress Notes (Signed)
ANTICOAGULATION CONSULT NOTE - Follow Up Consult  Pharmacy Consult for Heparin Indication: pulmonary embolus, DVT  No Known Allergies  Patient Measurements: Height: 6\' 3"  (190.5 cm) Weight: 270 lb 8.1 oz (122.7 kg) IBW/kg (Calculated) : 84.5 Heparin Dosing Weight: 108 kg  Vital Signs: Temp: 97.7 F (36.5 C) (10/03 0400) Temp Source: Oral (10/03 0400) BP: 141/87 mmHg (10/03 0400) Pulse Rate: 83 (10/03 0400)  Labs:  Recent Labs  10/02/15 1045 10/02/15 1350 10/02/15 1624 10/02/15 2035 10/03/15 0535  HGB 10.3*  --  9.8*  --   --   HCT 33.0*  --  31.6*  --   --   PLT 178  --  158  --   --   APTT 34  --   --   --   --   LABPROT 19.8*  --   --   --   --   INR 1.68*  --   --   --   --   HEPARINUNFRC  --  <0.10*  --  0.36 0.48  CREATININE 0.92  --   --   --  0.83    Estimated Creatinine Clearance: 133.6 mL/min (by C-G formula based on Cr of 0.83).   Medications:  Scheduled:  . antiseptic oral rinse  7 mL Mouth Rinse BID   Infusions:  . sodium chloride 100 mL (10/03/15 0410)  . heparin 1,750 Units/hr (10/03/15 0400)  . norepinephrine (LEVOPHED) Adult infusion Stopped (10/02/15 1359)   PRN: fentaNYL (SUBLIMAZE) injection, ondansetron (ZOFRAN) IV, sodium chloride, traMADol  Assessment: 14 yoM with colorectal cancer currently undergoing chemotherapy (FOLFOX q14d) presents with weakness, hypotension. CT chest shows new central and segmental bilateral pulmonary emboli with equivocal suggestion of right heart strain due to submassive (intermediate risk) PE. Pharmacy consulted to start heparin infusion.  Today, 10/03/2015  Heparin level therapeutic at 0.48 on 1750 units/hr  CBC pending  No bleeding reported  Goal of Therapy:  Heparin level 0.3-0.7 units/ml Monitor platelets by anticoagulation protocol: Yes   Plan:   Continue heparin at current rate  Daily heparin level, CBC  Peggyann Juba, PharmD, BCPS Pager: [phone] 10/03/2015,8:21 AM

## 2015-10-03 NOTE — Consult Note (Addendum)
CARDIOLOGY CONSULT NOTE       Patient ID: Jaime [name] MRN: [id number] DOB/AGE: 08/25/1956 59 y.o.  Admit date: 10/02/2015 Referring Physician:  Halford Chessman Primary Physician: Velna Hatchet, MD Primary Cardiologist:  New/ Reason for Consultation:  PAF  Active Problems:   Acute pulmonary embolism [facility])   HPI:  59 y.o. male was dx in July 2016 with Stage IV colon cancer. He was started on chemo with FOLFOX. He had Avastin added 08/30/15. His last chemo was about 3 weeks prior to admission. He normally has leg swelling, but noticed much more swelling in his Lt leg for the past 2 weeks. Over the past 2 days he was feeling weaker, and couldn't get out of bed. As a result his wife brought him to the ER. He was noted to be tachycardic and hypotension. He was given Abx with initial concern for sepsis >> no obvious source of infection. He eventually had CT chest which showed saddle PE. He was started on levophed while in ER for hypotension. Developed PAF over night but now NSR this am   He did not notice dyspnea or chest pain. There is no prior hx of thrombo-embolic disease. He has been feeling nauseous, but this usually happens after his chemo. Do to have more chemo this week    ROS All other systems reviewed and negative except as noted above  Past Medical History  Diagnosis Date  . Diabetes mellitus without complication ([place])   . Hypertension   . Metastatic carcinoma involving liver with unknown primary site [facility])   . Hyperlipidemia     Family History  Problem Relation Age of Onset  . Heart failure Mother   . Heart attack Mother   . Leukemia Father   . Colon cancer Neg Hx     Social History   Social History  . Marital Status: Married    Spouse Name: N/A  . Number of Children: N/A  . Years of Education: N/A   Occupational History  . Not on file.   Social History Main Topics  . Smoking status: Never Smoker   . Smokeless tobacco: Never Used  . Alcohol Use: 0.0  oz/week    0 Standard drinks or equivalent per week     Comment: social drinker  . Drug Use: No  . Sexual Activity: Yes   Other Topics Concern  . Not on file   Social History Narrative   Lives with wife at home    Past Surgical History  Procedure Laterality Date  . I&d extremity Left 06/19/2013    Procedure: IRRIGATION AND DEBRIDEMENT EXTREMITY;  Surgeon: Newt Minion, MD;  Location: [facility];  Service: Orthopedics;  Laterality: Left;  . I&d extremity Left 07/02/2013    Procedure: IRRIGATION AND DEBRIDEMENT EXTREMITY with placement of antibiotic beads and application of wound vac;  Surgeon: Newt Minion, MD;  Location: [place];  Service: Orthopedics;  Laterality: Left;  Irrigation and Debridement Left Foot, Place Beads, Wound VAC  . Amputation Left 01/08/2014    Procedure: AMPUTATION RAY;  Surgeon: Newt Minion, MD;  Location: [place];  Service: Orthopedics;  Laterality: Left;  Left Foot 5th Ray Amputation  . Ercp N/A 07/14/2015    Procedure: ENDOSCOPIC RETROGRADE CHOLANGIOPANCREATOGRAPHY (ERCP);  Surgeon: Ladene Artist, MD;  Location: [facility] ENDOSCOPY;  Service: Endoscopy;  Laterality: N/A;     . antiseptic oral rinse  7 mL Mouth Rinse BID  . feeding supplement (ENSURE ENLIVE)  237 mL Oral BID BM   .  sodium chloride 100 mL (10/03/15 0410)  . heparin 1,750 Units/hr (10/03/15 0400)    Physical Exam: Blood pressure 172/94, pulse 80, temperature 97.5 F (36.4 C), temperature source Oral, resp. rate 26, height 6\' 3"  (1.905 m), weight 122.7 kg (270 lb 8.1 oz), SpO2 98 %.   Affect appropriate Chronically ill white male  HEENT: normal Neck supple with no adenopathy JVP normal no bruits no thyromegaly Lungs clear with no wheezing and good diaphragmatic motion Heart:  S1/S2 no murmur, no rub, gallop or click PMI normal Abdomen: benighn, BS positve, no tenderness, no AAA no bruit.  No HSM or HJR Distal pulses intact with no bruits No edema Neuro non-focal Skin warm and dry No muscular  weakness Port a cath right subclavian  Labs:   Lab Results  Component Value Date   WBC 2.9* 10/02/2015   HGB 9.8* 10/02/2015   HCT 31.6* 10/02/2015   MCV 88.5 10/02/2015   PLT 158 10/02/2015    Recent Labs Lab 10/03/15 0535  NA 137  K 3.7  CL 108  CO2 22  BUN 19  CREATININE 0.83  CALCIUM 7.4*  PROT 5.2*  BILITOT 1.2  ALKPHOS 216*  ALT 9*  AST 22  GLUCOSE 106*      Radiology: Ct Angio Chest Pe W/cm &/or Wo Cm  10/02/2015   CLINICAL DATA:  Weakness. Hypotension. 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 Has htn, dm.  EXAM: CT ANGIOGRAPHY CHEST WITH CONTRAST  TECHNIQUE: Multidetector CT imaging of the chest was performed using the standard protocol during bolus administration of intravenous contrast. Multiplanar CT image reconstructions and MIPs were obtained to evaluate the vascular anatomy.  CONTRAST:  165mL OMNIPAQUE IOHEXOL 350 MG/ML SOLN  COMPARISON:  07/14/2015  FINDINGS: Contrast injection via right IJ power port. RV/LV ratio 1.05, borderline increased. However, the RV is less dilated than seen on prior study. There is a saddle embolus at the bifurcation of the main pulmonary artery with central emboli in the right upper lobe pulmonary artery and left lower lobe pulmonary artery branches, and more peripheral segmental emboli in lingular, left upper lobe, and right lower lobe branches.  Moderate right and tiny left pleural effusions.  Progressive sub carinal adenopathy. Subcentimeter precarinal node and small anterior mediastinal nodes. No definite hilar adenopathy.  12 mm lingular pulmonary nodule. Multiple left lower lobe pulmonary nodules, largest in the lateral basal segment measured and 16 mm, increased from previous exam. Dependent atelectasis in the right lower lobe, with several pulmonary nodules still conspicuous. Multiple small pulmonary nodules in both upper lobes largely stable. Thoracic spine and sternum intact.  Multiple large hepatic metastases. There is a large amount of abdominal ascites, significantly increased from previous exam.  Review of the MIP images confirms the above findings.  IMPRESSION: 1. New central and segmental bilateral pulmonary emboli with equivocal suggestion of right heart strain due to submassive (intermediate risk) PE. 2. Large amount of abdominal ascites, significantly increased from prior study. 3. Progression of pulmonary and mediastinal metastatic disease.  Critical Value/emergent results were called by telephone at the time of interpretation on 10/02/2015 at 12:37 pm to Dr. Deno Etienne , who verbally acknowledged these results.   Electronically Signed   By: Lucrezia Europe M.D.   On: 10/02/2015 12:38   Dg Chest Port 1 View  10/02/2015   CLINICAL DATA:  Weakness.  Metastatic cancer.  EXAM: PORTABLE CHEST 1 VIEW  COMPARISON:  Chest CT 07/14/2015  FINDINGS: Port-A-Cath  tip in the SVC.  Hypoventilation with decreased lung volume. Right lower lobe atelectasis. Mild left lower lobe atelectasis also noted.  Multiple lung nodules are noted on the prior CT. These are small and not well seen on the chest x-ray.  IMPRESSION: Hypoventilation with bibasilar atelectasis. Multiple lung nodules noted from prior chest CT.   Electronically Signed   By: Franchot Gallo M.D.   On: 10/02/2015 11:47    EKG:  ST rate 110 no acute T wave changes    ASSESSMENT AND PLAN:  PAF:   NSR now add cardizem in settin of HTN.  Related to acute PE.  Despite chemo Hct and Plt in reasonable range. Would consider eliquis for anticoagulation rather tha IVC filter.  Echo is pending but suspect some cor pulmonale and RV enlargement given PAF and saddle PE not a thrombolytic candidate due to stage 4 colon CA  Colon CA:  Dr Annitta Jersey to see suspect chemo will be post poned Denies bleeding issues despite stage 4 metastatic CA  Signed: Jenkins Rouge 10/03/2015, 9:29 AM

## 2015-10-03 NOTE — Progress Notes (Signed)
Echocardiogram 2D Echocardiogram has been performed.  Tresa Res 10/03/2015, 1:19 PM

## 2015-10-03 NOTE — Progress Notes (Signed)
[facility]  Patient Status: Active (receiving services up to time of hospitalization)  [facility] is providing the following services: RN and PT  If patient discharges after hours, please call [phone].   Jaime [name] 10/03/2015, 11:40 AM

## 2015-10-04 ENCOUNTER — Other Ambulatory Visit: Payer: Managed Care, Other (non HMO)

## 2015-10-04 ENCOUNTER — Inpatient Hospital Stay (HOSPITAL_COMMUNITY): Payer: Managed Care, Other (non HMO)

## 2015-10-04 ENCOUNTER — Ambulatory Visit: Payer: Managed Care, Other (non HMO)

## 2015-10-04 ENCOUNTER — Encounter: Payer: Managed Care, Other (non HMO) | Admitting: Nutrition

## 2015-10-04 DIAGNOSIS — M7989 Other specified soft tissue disorders: Secondary | ICD-10-CM

## 2015-10-04 DIAGNOSIS — L8990 Pressure ulcer of unspecified site, unspecified stage: Secondary | ICD-10-CM

## 2015-10-04 DIAGNOSIS — R188 Other ascites: Secondary | ICD-10-CM | POA: Diagnosis present

## 2015-10-04 LAB — HEPARIN LEVEL (UNFRACTIONATED)
HEPARIN UNFRACTIONATED: 0.41 [IU]/mL (ref 0.30–0.70)
Heparin Unfractionated: 0.37 IU/mL (ref 0.30–0.70)

## 2015-10-04 LAB — GLUCOSE, CAPILLARY
GLUCOSE-CAPILLARY: 137 mg/dL — AB (ref 65–99)
Glucose-Capillary: 89 mg/dL (ref 65–99)
Glucose-Capillary: 133 mg/dL — ABNORMAL HIGH (ref 65–99)

## 2015-10-04 LAB — BASIC METABOLIC PANEL
ANION GAP: 6 (ref 5–15)
BUN: 17 mg/dL (ref 6–20)
CALCIUM: 7.7 mg/dL — AB (ref 8.9–10.3)
CO2: 23 mmol/L (ref 22–32)
Chloride: 108 mmol/L (ref 101–111)
Creatinine, Ser: 0.79 mg/dL (ref 0.61–1.24)
Glucose, Bld: 147 mg/dL — ABNORMAL HIGH (ref 65–99)
POTASSIUM: 3.4 mmol/L — AB (ref 3.5–5.1)
Sodium: 137 mmol/L (ref 135–145)

## 2015-10-04 LAB — CBC
HEMATOCRIT: 31 % — AB (ref 39.0–52.0)
Hemoglobin: 9.5 g/dL — ABNORMAL LOW (ref 13.0–17.0)
MCH: 27.2 pg (ref 26.0–34.0)
MCHC: 30.6 g/dL (ref 30.0–36.0)
MCV: 88.8 fL (ref 78.0–100.0)
PLATELETS: 154 10*3/uL (ref 150–400)
RBC: 3.49 MIL/uL — ABNORMAL LOW (ref 4.22–5.81)
RDW: 17.8 % — AB (ref 11.5–15.5)
WBC: 3.5 10*3/uL — ABNORMAL LOW (ref 4.0–10.5)

## 2015-10-04 LAB — MAGNESIUM: MAGNESIUM: 1.4 mg/dL — AB (ref 1.7–2.4)

## 2015-10-04 MED ORDER — FUROSEMIDE 10 MG/ML IJ SOLN
40.0000 mg | Freq: Every day | INTRAMUSCULAR | Status: DC
  Administered 2015-10-05: 40 mg via INTRAVENOUS
  Filled 2015-10-04: qty 4

## 2015-10-04 MED ORDER — FUROSEMIDE 10 MG/ML IJ SOLN
40.0000 mg | Freq: Two times a day (BID) | INTRAMUSCULAR | Status: DC
  Administered 2015-10-04: 40 mg via INTRAVENOUS
  Filled 2015-10-04: qty 4

## 2015-10-04 MED ORDER — POTASSIUM CHLORIDE CRYS ER 20 MEQ PO TBCR
40.0000 meq | EXTENDED_RELEASE_TABLET | Freq: Once | ORAL | Status: AC
  Administered 2015-10-04: 40 meq via ORAL
  Filled 2015-10-04: qty 2

## 2015-10-04 NOTE — Progress Notes (Signed)
IP PROGRESS NOTE  Subjective:  No pain or nausea. He remains "weak ".    Objective: Vital signs in last 24 hours: Blood pressure 163/82, pulse 67, temperature 97.8 F (36.6 C), temperature source Oral, resp. rate 20, height 6\' 3"  (1.905 m), weight 270 lb 8.1 oz (122.7 kg), SpO2 95 %.  Intake/Output from previous day: 10/03 0701 - 10/04 0700 In: 1818.5 [P.O.:480; I.V.:838.5] Out: 475 [Urine:475]  Physical Exam:  HEENT: No thrush Lungs: Clear anteriorly Cardiac: Regular rate and rhythm Abdomen: Distended, the liver edge is palpable in the medial right subcostal region Extremities: Pitting edema throughout the legs bilaterally   Portacath/PICC-without erythema  Lab Results:  Recent Labs  10/03/15 1625 10/04/15 0435  WBC 4.6 3.5*  HGB 10.2* 9.5*  HCT 32.7* 31.0*  PLT 170 154    BMET  Recent Labs  10/02/15 1045 10/03/15 0535  NA 139 137  K 3.7 3.7  CL 110 108  CO2 19* 22  GLUCOSE 90 106*  BUN 17 19  CREATININE 0.92 0.83  CALCIUM 7.9* 7.4*    Medications: I have reviewed the patient's current medications.  Assessment/Plan:  1. 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, status post a colonoscopy 07/21/2015 confirming a sigmoid colon mass  Staging CT scans 07/13/2015 and 07/14/2015 confirmed extensive liver metastases, lung nodules, a right pleural effusion, and porta hepatis lymphadenopathy  Cycle 1 FOLFOX (chemotherapy doses reduced and bolus 5-FU eliminated) 08/02/2015  Cycle 2 FOLFOX (chemotherapy doses reduced and bolus 5-FU eliminated) 08/16/2015  Cycle 3 FOLFOX (Avastin added, bolus 5-FU introduced, and infusional 5-FU dose escalated) 08/30/2015  Cycle 4 FOLFOX plus Avastin 09/13/2015  Cycle 5 held on 09/27/2015 due to mild neutropenia, recent diarrhea.  2. Obstructive jaundice, likely secondary to porta hepatis lymphadenopathy, status post placement of a bile duct stent on 07/14/2015  3. Microcytic anemia secondary to the colon  mass-improved  4. Diabetes  5. Failure to thrive secondary to metastatic colon cancer  6. Port-A-Cath placement 08/01/2015  7. Nausea following cycle 1 and cycle 2 FOLFOX-emend added with cycle 3  8. Diarrhea following cycle 4 FOLFOX/Avastin.  9. Neutropenia following cycle 4 FOLFOX/Avastin. Neulasta to be added with cycle 5.  10.  Admission 10/02/2015 with bilateral DVTs and pulmonary emboli-maintained on heparin  Mr. Ress appears unchanged. He remains in a weakened condition secondary to metastatic colon cancer, malnutrition, deconditioning, chemotherapy, and pulmonary emboli.  I will further review the admission CT with regard to the metastatic tumor burden. We will then decide on continuing FOLFOX/Avastin or switching to FOLFIRI chemotherapy. There is no contraindication to continuing Avastin while on anticoagulation therapy.     Recommendations: 1. Continue heparin anticoagulation for at least 5 days, then switch to Xarelto 2. Palliative paracentesis with fluid to be sent for cytology 3. Increase ambulation as tolerated, physical therapy evaluation 4. Oncology will continue following him daily in the hospital.     LOS: 2 days   [facility], Jaime [name]  10/04/2015, 12:46 PM

## 2015-10-04 NOTE — Progress Notes (Signed)
ANTICOAGULATION CONSULT NOTE - Follow Up Consult  Pharmacy Consult for Heparin Indication: pulmonary embolus  No Known Allergies  Patient Measurements: Height: 6\' 3"  (190.5 cm) Weight: 270 lb 8.1 oz (122.7 kg) IBW/kg (Calculated) : 84.5 Heparin Dosing Weight:   Vital Signs: Temp: 97.7 F (36.5 C) (10/03 2137) Temp Source: Oral (10/03 2137) BP: 158/83 mmHg (10/03 2137) Pulse Rate: 73 (10/03 2137)  Labs:  Recent Labs  10/02/15 1045  10/02/15 1624  10/03/15 0535 10/03/15 1625 10/03/15 2352  HGB 10.3*  --  9.8*  --   --  10.2*  --   HCT 33.0*  --  31.6*  --   --  32.7*  --   PLT 178  --  158  --   --  170  --   APTT 34  --   --   --   --   --   --   LABPROT 19.8*  --   --   --   --   --   --   INR 1.68*  --   --   --   --   --   --   HEPARINUNFRC  --   < >  --   < > 0.48 0.64 0.37  CREATININE 0.92  --   --   --  0.83  --   --   < > = values in this interval not displayed.  Estimated Creatinine Clearance: 133.6 mL/min (by C-G formula based on Cr of 0.83).   Medications:  Infusions:  . sodium chloride 10 mL/hr at 10/03/15 0912  . heparin 1,600 Units/hr (10/03/15 1800)    Assessment: Patient with heparin level at goal.  No heparin issues noted.  Goal of Therapy:  Heparin level 0.3-0.7 units/ml Monitor platelets by anticoagulation protocol: Yes   Plan:  Continue heparin drip at current rate Recheck level with AM labs  Tyler Deis, Shea Stakes [place] 10/04/2015,4:47 AM

## 2015-10-04 NOTE — Progress Notes (Signed)
[facility] PROGRESS NOTE  Jaime [name] ZOX:[id number] DOB: 07/22/1956 DOA: 10/02/2015 PCP: Velna Hatchet, MD  Assessment/Plan: 59 yo male was dx in July 2016 with Stage IV colon cancer. He was started on chemo with FOLFOX. He had Avastin added 08/30/15. His last chemo was about 3 weeks prior to admission. He normally has leg swelling, but noticed much more swelling in his Lt leg for the past 2 weeks. Over the past 2 days he was feeling weaker, and couldn't get out of bed. As a result his wife brought him to the ER. He was noted to be tachycardic and hypotension. He was given Abx with initial concern for sepsis >> no obvious source of infection. He eventually had CT chest which showed saddle PE. He was started on levophed while in ER for hypotension.  On heparin Gtt for Submassive PE, bilateral DVT. Transfer care to [facility].   1-Submassive PE, Bilateral LE DVT; Dr Ammie Dalton recommending 5 days of IV heparin. Subsequently transition heparin to xarelto.  Monitor BP. Vitals stable.   2-Ascites, anasarca: Paracentesis ordered for diagnostic and therapeutic. Appreciate Dr Benay Spice help. I will also start IV lasix. Adjust as needed. Need to be cautious and avoid Hypotension. Strict I and O. Daily weight.   3-Stage IV colon Cancer; multiples large hepatic metastatic lesions.  Dr Benay Spice following.   4-Anemia: in setting of malignancy, Chemo. Follow hb trend.   5-Hypotension; was transiently on pressors. Resolved.   6-Chronic diarrhea; was started on lomotil.   7-FTT, weakness; multifactorial. Secondary to malignancy, anasarca, anemia, PE. Needs PT.   8-PAF: converted to sinus. Cardiology was consulted by CCM.   9-Obstructive jaundice, likely secondary to porta hepatis lymphadenopathy, status post placement of a bile duct stent on 07/14/2015  Code Status: Full Code.  Family Communication: care discussed with patient.  Disposition Plan: Remain inpatient for IV heparin,  diuresis.    Consultants:  Cardiology  Pulmonary   Procedures:  ECHO;Left ventricle: The cavity size was normal. Wall thickness was increased in a pattern of mild LVH. Systolic function was normal. The estimated ejection fraction was in the range of 50% to 55%. Wall motion was normal; there were no regional wall motion abnormalities. Doppler parameters are consistent with abnormal left ventricular relaxation (grade 1 diastolic dysfunction). - Aortic valve: There was trivial regurgitation. - Left atrium: The atrium was moderately dilated.  Doppler: There is extensive, occlusive, acute DVT throughout the bilateral lower extremities, beginning in the posterior tibial vein and the peroneal vein and coursing through to the popliteal, femoral, and common femoral veins.   Antibiotics:  none  HPI/Subjective: He is feeling ok, denies dyspnea. Is very difficult to him to stand up, or walk with leg swelling and abdominal distension.   Objective: Filed Vitals:   10/04/15 0551  BP: 163/82  Pulse: 67  Temp: 97.8 F (36.6 C)  Resp: 20    Intake/Output Summary (Last 24 hours) at 10/04/15 1146 Last data filed at 10/04/15 1030  Gross per 24 hour  Intake 1464.5 ml  Output    476 ml  Net  988.5 ml   Filed Weights   10/02/15 1042 10/02/15 1558  Weight: 113.399 kg (250 lb) 122.7 kg (270 lb 8.1 oz)    Exam:   General:  Alert, pale in no acute distress.   Cardiovascular: S 1, S 2 RRR  Respiratory: CTA  Abdomen: BS present, distended, no rigidity  Musculoskeletal: plus 2 edema  Data Reviewed: Basic Metabolic Panel:  Recent Labs Lab  10/02/15 1045 10/03/15 0535  NA 139 137  K 3.7 3.7  CL 110 108  CO2 19* 22  GLUCOSE 90 106*  BUN 17 19  CREATININE 0.92 0.83  CALCIUM 7.9* 7.4*   Liver Function Tests:  Recent Labs Lab 10/02/15 1045 10/03/15 0535  AST 26 22  ALT 9* 9*  ALKPHOS 258* 216*  BILITOT 1.6* 1.2  PROT 5.4* 5.2*  ALBUMIN 1.8* 1.7*   No  results for input(s): LIPASE, AMYLASE in the last 168 hours. No results for input(s): AMMONIA in the last 168 hours. CBC:  Recent Labs Lab 10/02/15 1045 10/02/15 1624 10/03/15 1625 10/04/15 0435  WBC 1.4* 2.9* 4.6 3.5*  NEUTROABS 0.5* 1.5*  --   --   HGB 10.3* 9.8* 10.2* 9.5*  HCT 33.0* 31.6* 32.7* 31.0*  MCV 89.2 88.5 88.9 88.8  PLT 178 158 170 154   Cardiac Enzymes: No results for input(s): CKTOTAL, CKMB, CKMBINDEX, TROPONINI in the last 168 hours. BNP (last 3 results) No results for input(s): BNP in the last 8760 hours.  ProBNP (last 3 results) No results for input(s): PROBNP in the last 8760 hours.  CBG:  Recent Labs Lab 10/02/15 1623 10/03/15 0746 10/03/15 1212 10/03/15 1701 10/04/15 0734  GLUCAP 98 107* 218* 107* 89    Recent Results (from the past 240 hour(s))  Blood Culture (routine x 2)     Status: None (Preliminary result)   Collection Time: 10/02/15 10:50 AM  Result Value Ref Range Status   Specimen Description BLOOD CHEST PORT  3 ML IN YELLOW BOTTLE  Final   Special Requests NONE  Final   Culture   Final    NO GROWTH 2 DAYS Performed at [facility]    Report Status PENDING  Incomplete  Blood Culture (routine x 2)     Status: None (Preliminary result)   Collection Time: 10/02/15 11:00 AM  Result Value Ref Range Status   Specimen Description BLOOD LEFT HAND  1 ML IN YELLOW BOTTLE  Final   Special Requests NONE  Final   Culture   Final    NO GROWTH 2 DAYS Performed at [facility]    Report Status PENDING  Incomplete  Urine culture     Status: None   Collection Time: 10/02/15  1:45 PM  Result Value Ref Range Status   Specimen Description URINE, CLEAN CATCH  Final   Special Requests Immunocompromised  Final   Culture   Final    NO GROWTH 1 DAY Performed at [facility]    Report Status 10/03/2015 FINAL  Final  MRSA PCR Screening     Status: None   Collection Time: 10/02/15  3:50 PM  Result Value Ref Range Status    MRSA by PCR NEGATIVE NEGATIVE Final    Comment:        The GeneXpert MRSA Assay (FDA approved for NASAL specimens only), is one component of a comprehensive MRSA colonization surveillance program. It is not intended to diagnose MRSA infection nor to guide or monitor treatment for MRSA infections.      Studies: Ct Angio Chest Pe W/cm &/or Wo Cm  10/02/2015   CLINICAL DATA:  Weakness. Hypotension. 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 Has htn, dm.  EXAM: CT ANGIOGRAPHY CHEST WITH CONTRAST  TECHNIQUE: Multidetector CT imaging of the chest was performed using the standard protocol during bolus administration of intravenous contrast. Multiplanar CT image reconstructions and MIPs were obtained  to evaluate the vascular anatomy.  CONTRAST:  14mL OMNIPAQUE IOHEXOL 350 MG/ML SOLN  COMPARISON:  07/14/2015  FINDINGS: Contrast injection via right IJ power port. RV/LV ratio 1.05, borderline increased. However, the RV is less dilated than seen on prior study. There is a saddle embolus at the bifurcation of the main pulmonary artery with central emboli in the right upper lobe pulmonary artery and left lower lobe pulmonary artery branches, and more peripheral segmental emboli in lingular, left upper lobe, and right lower lobe branches.  Moderate right and tiny left pleural effusions.  Progressive sub carinal adenopathy. Subcentimeter precarinal node and small anterior mediastinal nodes. No definite hilar adenopathy.  12 mm lingular pulmonary nodule. Multiple left lower lobe pulmonary nodules, largest in the lateral basal segment measured and 16 mm, increased from previous exam. Dependent atelectasis in the right lower lobe, with several pulmonary nodules still conspicuous. Multiple small pulmonary nodules in both upper lobes largely stable. Thoracic spine and sternum intact. Multiple large hepatic metastases. There is a large amount of abdominal ascites,  significantly increased from previous exam.  Review of the MIP images confirms the above findings.  IMPRESSION: 1. New central and segmental bilateral pulmonary emboli with equivocal suggestion of right heart strain due to submassive (intermediate risk) PE. 2. Large amount of abdominal ascites, significantly increased from prior study. 3. Progression of pulmonary and mediastinal metastatic disease.  Critical Value/emergent results were called by telephone at the time of interpretation on 10/02/2015 at 12:37 pm to Dr. Deno Etienne , who verbally acknowledged these results.   Electronically Signed   By: Lucrezia Europe M.D.   On: 10/02/2015 12:38    Scheduled Meds: . antiseptic oral rinse  7 mL Mouth Rinse BID  . feeding supplement (ENSURE ENLIVE)  237 mL Oral BID BM   Continuous Infusions: . sodium chloride 10 mL/hr at 10/03/15 0912  . heparin 1,600 Units/hr (10/04/15 1033)    Active Problems:   Acute pulmonary embolism (HCC)   PAF (paroxysmal atrial fibrillation) ([place])   Acute saddle pulmonary embolism with acute cor pulmonale (HCC)   Pressure ulcer    Time spent: 25 minutes.     Niel Hummer A  [facility] Pager [phone]. If 7PM-7AM, please contact night-coverage at [email or web address], password [facility] 10/04/2015, 11:46 AM  LOS: 2 days

## 2015-10-04 NOTE — Progress Notes (Signed)
PT Cancellation Note  Patient Details Name: Jaime [name] MRN: [id number] DOB: 04/03/1956   Cancelled Treatment:    Reason Eval/Treat Not Completed: Other (comment) (pt stated he's having diarrhea and that he doesn't want to get OOB today. Will follow. )   [name] 10/04/2015, 1:13 PM [phone]

## 2015-10-04 NOTE — Progress Notes (Signed)
OT Cancellation Note  Patient Details Name: Jaime [name] MRN: [id number] DOB: March 21, 1956   Cancelled Treatment:     Pt declined OT Eval today saying "I already talked with my doctor today and I will try tomorrow, but not today" Pt also on bedpan during this attempt. Will check back tomorrow as able for OT assessment.  Carlynn Herald Inita Uram Beth Dixon, OTR/L 10/04/2015, 10:01 AM

## 2015-10-05 ENCOUNTER — Inpatient Hospital Stay (HOSPITAL_COMMUNITY): Payer: Managed Care, Other (non HMO)

## 2015-10-05 ENCOUNTER — Telehealth: Payer: Self-pay | Admitting: *Deleted

## 2015-10-05 DIAGNOSIS — C189 Malignant neoplasm of colon, unspecified: Secondary | ICD-10-CM

## 2015-10-05 DIAGNOSIS — D630 Anemia in neoplastic disease: Secondary | ICD-10-CM

## 2015-10-05 DIAGNOSIS — R197 Diarrhea, unspecified: Secondary | ICD-10-CM

## 2015-10-05 LAB — HEPARIN LEVEL (UNFRACTIONATED)
HEPARIN UNFRACTIONATED: 0.2 [IU]/mL — AB (ref 0.30–0.70)
HEPARIN UNFRACTIONATED: 0.35 [IU]/mL (ref 0.30–0.70)
Heparin Unfractionated: 0.48 IU/mL (ref 0.30–0.70)

## 2015-10-05 LAB — CBC
HEMATOCRIT: 32.8 % — AB (ref 39.0–52.0)
Hemoglobin: 10.1 g/dL — ABNORMAL LOW (ref 13.0–17.0)
MCH: 27.2 pg (ref 26.0–34.0)
MCHC: 30.8 g/dL (ref 30.0–36.0)
MCV: 88.2 fL (ref 78.0–100.0)
PLATELETS: 176 10*3/uL (ref 150–400)
RBC: 3.72 MIL/uL — ABNORMAL LOW (ref 4.22–5.81)
RDW: 17.8 % — AB (ref 11.5–15.5)
WBC: 4.5 10*3/uL (ref 4.0–10.5)

## 2015-10-05 LAB — GLUCOSE, CAPILLARY
GLUCOSE-CAPILLARY: 108 mg/dL — AB (ref 65–99)
GLUCOSE-CAPILLARY: 103 mg/dL — AB (ref 65–99)
Glucose-Capillary: 98 mg/dL (ref 65–99)

## 2015-10-05 LAB — BASIC METABOLIC PANEL
ANION GAP: 7 (ref 5–15)
BUN: 16 mg/dL (ref 6–20)
CALCIUM: 7.8 mg/dL — AB (ref 8.9–10.3)
CO2: 25 mmol/L (ref 22–32)
CREATININE: 0.79 mg/dL (ref 0.61–1.24)
Chloride: 108 mmol/L (ref 101–111)
GLUCOSE: 110 mg/dL — AB (ref 65–99)
Potassium: 3.5 mmol/L (ref 3.5–5.1)
Sodium: 140 mmol/L (ref 135–145)

## 2015-10-05 MED ORDER — FUROSEMIDE 10 MG/ML IJ SOLN
40.0000 mg | Freq: Two times a day (BID) | INTRAMUSCULAR | Status: DC
  Administered 2015-10-05 – 2015-10-08 (×7): 40 mg via INTRAVENOUS
  Filled 2015-10-05 (×7): qty 4

## 2015-10-05 MED ORDER — ALBUMIN HUMAN 25 % IV SOLN
50.0000 g | Freq: Once | INTRAVENOUS | Status: AC
  Administered 2015-10-05: 50 g via INTRAVENOUS
  Filled 2015-10-05: qty 200

## 2015-10-05 MED ORDER — ONDANSETRON HCL 4 MG PO TABS: 8.0000 mg | ORAL_TABLET | Freq: Three times a day (TID) | ORAL | Status: DC | PRN

## 2015-10-05 MED ORDER — HEPARIN (PORCINE) IN NACL 100-0.45 UNIT/ML-% IJ SOLN
1850.0000 [IU]/h | INTRAMUSCULAR | Status: DC
  Administered 2015-10-06: 1850 [IU]/h via INTRAVENOUS
  Filled 2015-10-05 (×3): qty 250

## 2015-10-05 MED ORDER — HEPARIN BOLUS VIA INFUSION
2000.0000 [IU] | Freq: Once | INTRAVENOUS | Status: AC
  Administered 2015-10-05: 2000 [IU] via INTRAVENOUS
  Filled 2015-10-05: qty 2000

## 2015-10-05 MED ORDER — ALBUMIN HUMAN 25 % IV SOLN: 50.0000 g | Freq: Once | INTRAVENOUS | Status: DC

## 2015-10-05 NOTE — Progress Notes (Signed)
PT Cancellation Note  Patient Details Name: Jaime [name] MRN: [id number] DOB: [birth year]   Cancelled Treatment:    Reason Eval/Treat Not Completed: Medical issues which prohibited therapy (heparin subtherapeutic. Will follow.)   Blondell Reveal Kistler 10/05/2015, 9:30 AM [phone]

## 2015-10-05 NOTE — Progress Notes (Signed)
OT Cancellation Note  Patient Details Name: Jaime [name] MRN: [id number] DOB: 1956-05-01   Cancelled Treatment:    Reason Eval/Treat Not Completed: Medical issues which prohibited therapy Note heparin level at 0.2 today. Also see plan for paracentesis today. Will check back later time.  Pauline Aus [place]  [phone] 10/05/2015, 9:02 AM  [name] PT 10/05/2015  [phone]

## 2015-10-05 NOTE — Progress Notes (Signed)
IP PROGRESS NOTE  Subjective:  No new complaint. He has not been out of bed. He reports a large amount of urine output.  Objective: Vital signs in last 24 hours: Blood pressure 156/85, pulse 77, temperature 98.4 F (36.9 C), temperature source Oral, resp. rate 18, height 6\' 3"  (1.905 m), weight 270 lb 8.1 oz (122.7 kg), SpO2 97 %.  Intake/Output from previous day: 10/04 0701 - 10/05 0700 In: 2272 [P.O.:1648; I.V.:624] Out: 3926 [Urine:3725; Emesis/NG output:200; Stool:1]  Physical Exam:  HEENT: No thrush Lungs: Clear anteriorly Cardiac: Regular rate and rhythm Abdomen: Distended, the liver edge is palpable in the medial right subcostal region Extremities: Pitting edema throughout the legs bilaterally, left greater than right   Portacath/PICC-without erythema  Lab Results:  Recent Labs  10/04/15 0435 10/05/15 0445  WBC 3.5* 4.5  HGB 9.5* 10.1*  HCT 31.0* 32.8*  PLT 154 176    BMET  Recent Labs  10/04/15 1440 10/05/15 0445  NA 137 140  K 3.4* 3.5  CL 108 108  CO2 23 25  GLUCOSE 147* 110*  BUN 17 16  CREATININE 0.79 0.79  CALCIUM 7.7* 7.8*    Medications: I have reviewed the patient's current medications.  Assessment/Plan:  1. 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, status post a colonoscopy 07/21/2015 confirming a sigmoid colon mass  Staging CT scans 07/13/2015 and 07/14/2015 confirmed extensive liver metastases, lung nodules, a right pleural effusion, and porta hepatis lymphadenopathy  Cycle 1 FOLFOX (chemotherapy doses reduced and bolus 5-FU eliminated) 08/02/2015  Cycle 2 FOLFOX (chemotherapy doses reduced and bolus 5-FU eliminated) 08/16/2015  Cycle 3 FOLFOX (Avastin added, bolus 5-FU introduced, and infusional 5-FU dose escalated) 08/30/2015  Cycle 4 FOLFOX plus Avastin 09/13/2015  Cycle 5 held on 09/27/2015 due to mild neutropenia, recent diarrhea.  2. Obstructive jaundice, likely secondary to porta hepatis lymphadenopathy, status  post placement of a bile duct stent on 07/14/2015  3. Microcytic anemia secondary to the colon mass-improved  4. Diabetes  5. Failure to thrive secondary to metastatic colon cancer  6. Port-A-Cath placement 08/01/2015  7. Nausea following cycle 1 and cycle 2 FOLFOX-emend added with cycle 3  8. Diarrhea following cycle 4 FOLFOX/Avastin.  9. Neutropenia following cycle 4 FOLFOX/Avastin. Neulasta to be added with cycle 5.  10.  Admission 10/02/2015 with bilateral DVTs and pulmonary emboli-maintained on heparin  Mr. Costabile appears unchanged. He remains very weak. Hopefully the diuresis we will increase his ability to ambulate. I will review the admission chest CT today and decide on the indication for continuing FOLFOX versus a switch to FOLFIRI. He is scheduled for a palliative paracentesis today.      Recommendations: 1. Continue heparin anticoagulation for 2 more days, then switch to Xarelto 2. Palliative paracentesis today with fluid to be sent for cytology 3. Increase ambulation as tolerated, physical therapy evaluation 4. Oncology will continue following him daily in the hospital.     LOS: 3 days   [place], [place]  10/05/2015, 7:22 AM

## 2015-10-05 NOTE — Progress Notes (Signed)
ANTICOAGULATION CONSULT NOTE - Follow Up Consult  Pharmacy Consult for Heparin Indication: pulmonary embolus  No Known Allergies  Patient Measurements: Height: 6\' 3"  (190.5 cm) Weight: 270 lb 8.1 oz (122.7 kg) IBW/kg (Calculated) : 84.5 Heparin Dosing Weight: 111 kg  Vital Signs: Temp: 98.4 F (36.9 C) (10/05 0431) Temp Source: Oral (10/05 0431) BP: 156/85 mmHg (10/05 0431) Pulse Rate: 77 (10/05 0431)  Labs:  Recent Labs  10/02/15 1045  10/03/15 0535 10/03/15 1625 10/03/15 2352 10/04/15 0435 10/04/15 1440 10/05/15 0445  HGB 10.3*  < >  --  10.2*  --  9.5*  --  10.1*  HCT 33.0*  < >  --  32.7*  --  31.0*  --  32.8*  PLT 178  < >  --  170  --  154  --  176  APTT 34  --   --   --   --   --   --   --   LABPROT 19.8*  --   --   --   --   --   --   --   INR 1.68*  --   --   --   --   --   --   --   HEPARINUNFRC  --   < > 0.48 0.64 0.37 0.41  --  0.20*  CREATININE 0.92  --  0.83  --   --   --  0.79 0.79  < > = values in this interval not displayed.  Estimated Creatinine Clearance: 138.6 mL/min (by C-G formula based on Cr of 0.79).   Medications:  Infusions:  . sodium chloride 10 mL/hr at 10/03/15 0912  . heparin 1,600 Units/hr (10/05/15 0449)    Assessment: 89 yoM with colorectal cancer currently undergoing chemotherapy (FOLFOX q14d) presents on 10/2 with weakness, hypotension. Lactic acid elevated, code sepsis called, and started on broad spectrum antibiotics. He was also found to have RV dilation on cardiac [place]; CT confirmed saddle PE and Dopplers revealed bilateral DVTs.  Today, 10/05/2015:  Heparin level 0.2, subtherapeutic  CBC: Hgb stable at 10.1, Plt remain WNL  Rn reports no IV complications, bleeding or adverse events.   Goal of Therapy:  Heparin level 0.3-0.7 units/ml Monitor platelets by anticoagulation protocol: Yes   Plan:   Give heparin 2000 units bolus IV x 1  Increase to heparin IV infusion at 1850 units/hr  Heparin level 6 hours after  rate change  Daily heparin level and CBC  Continue to monitor H&H and platelets   Gretta Arab PharmD, BCPS Pager [phone] 10/05/2015 7:22 AM

## 2015-10-05 NOTE — Progress Notes (Signed)
Brief Anticoagulation Follow Up Consult  Pharmacy Consult for Heparin Indication: pulmonary embolus and DVT  Medications:  Infusions:  . sodium chloride 10 mL/hr at 10/03/15 0912  . heparin 1,850 Units/hr (10/05/15 0741)    Assessment: 80 yoM with colorectal cancer currently undergoing chemotherapy (FOLFOX q14d) presents on 10/2 with weakness, hypotension. Lactic acid elevated, code sepsis called, and started on broad spectrum antibiotics. He was also found to have RV dilation on cardiac [place]; CT confirmed saddle PE and Dopplers revealed bilateral DVTs.  10/04/15 @ 1400 Update:  Heparin level increased to 0.48, therapeutic  Rn reports no IV complications, bleeding or adverse events.  Today, 10/05/15  2219=0.35 (confirmatory), no problems reported per RN   Goal of Therapy:  Heparin level 0.3-0.7 units/ml Monitor platelets by anticoagulation protocol: Yes   Plan:   Continue heparin IV infusion at 1850 units/hr  Daily heparin level and CBC  Continue to monitor H&H and platelets   Dorrene German 10/05/2015 11:56 PM

## 2015-10-05 NOTE — Progress Notes (Signed)
[facility] PROGRESS NOTE  Jaime [name] JAS:[id number] DOB: Nov 12, 1956 DOA: 10/02/2015 PCP:  Velna Hatchet, MD  Brief narrative 59 year old male with stage IV colon cancer diagnosed in July 16 started on chemotherapy with FOLFOX, added Avastin on 08/30/2015 with last chemotherapy about 3 weeks prior to admission who presented to the ED with increased weakness for the past 2 days and increased left leg swelling 2 weeks duration. Patient was tachycardic and hypotensive in the ED and was given empiric antibiotic with concern for sepsis. However no clinical source of infection was found and a CT angiogram of the chest showed saddle PE. Patient was admitted to ICU with septic shock and initiated levophed in the ED. He was also found to have bilateral lower leg DVT. Patient started on IV heparin and transferred to hospitalist service on 10/4.  Assessment/Plan: Saddle PE with bilateral lower leg extremity DVT Risk factor includes underlying malignancy. On IV heparin for 5 days and then transitioned to oral Xarelto on 10/7.  Hemodynamically stable at this time. He will possibly need lifelong  anticoagulation.    metastatic stage IV colon cancer  multiple large hepatic metastasis with ascites. Plan on therapeutic paracentesis today. Will order IV albumin with procedure. Dr. Learta Codding following.  Abdominal ascites with anasarca  secondary to hepatic metastatis and hypoproteinemia Increased lasix dose as BP better. IV albumin with paracentesis.  PT eval in am  Hypotension  on admission with saddle PE. Off pressors. BP elevated. Increased lasix dose.  PAF Converted to sinus rhythm. Cardiology was [facility].  Chr diarrhea  improved with lomotil  generalized weakness with failure tothrive Secondary to above issues. PT eval  Anemia  secondary to malignancy and chemo. Monitor.  DVT Prophylaxis: IV heaprin  DIET: REGULAR  Code Status: full code Family Communication: wife at  bedside Disposition Plan: home  possibly on 10/7   Consultants:  PCCM  Oncology    Procedures:  US paracentesis  Antibiotics: In ED  HPI/Subjective: Patient seen and examined . Denies abd pain or dyspnea. Has increased abd distension and leg swellings.  Objective: Filed Vitals:   10/05/15 0431  BP: 156/85  Pulse: 77  Temp: 98.4 F (36.9 C)  Resp: 18    Intake/Output Summary (Last 24 hours) at 10/05/15 1533 Last data filed at 10/05/15 1500  Gross per 24 hour  Intake 1759.29 ml  Output   4925 ml  Net -3165.71 ml   Filed Weights   10/02/15 1042 10/02/15 1558  Weight: 113.399 kg (250 lb) 122.7 kg (270 lb 8.1 oz)    Exam:   General:  NAD  HEENT: pallor+, moist mucosa  Chest: clear b/l   CVS: NS1&S2  GI: distended with ascites, non tender  Musculoskeletal: 2+ pitting edema B/L  CNS: alert and oriented.   Data Reviewed: Basic Metabolic Panel:  Recent Labs Lab 10/02/15 1045 10/03/15 0535 10/04/15 1440 10/05/15 0445  NA 139 137 137 140  K 3.7 3.7 3.4* 3.5  CL 110 108 108 108  CO2 19* 22 23 25   GLUCOSE 90 106* 147* 110*  BUN 17 19 17 16   CREATININE 0.92 0.83 0.79 0.79  CALCIUM 7.9* 7.4* 7.7* 7.8*  MG  --   --  1.4*  --    Liver Function Tests:  Recent Labs Lab 10/02/15 1045 10/03/15 0535  AST 26 22  ALT 9* 9*  ALKPHOS 258* 216*  BILITOT 1.6* 1.2  PROT 5.4* 5.2*  ALBUMIN 1.8* 1.7*   No results for input(s): LIPASE,  AMYLASE in the last 168 hours. No results for input(s): AMMONIA in the last 168 hours. CBC:  Recent Labs Lab 10/02/15 1045 10/02/15 1624 10/03/15 1625 10/04/15 0435 10/05/15 0445  WBC 1.4* 2.9* 4.6 3.5* 4.5  NEUTROABS 0.5* 1.5*  --   --   --   HGB 10.3* 9.8* 10.2* 9.5* 10.1*  HCT 33.0* 31.6* 32.7* 31.0* 32.8*  MCV 89.2 88.5 88.9 88.8 88.2  PLT 178 158 170 154 176   Cardiac Enzymes: No results for input(s): CKTOTAL, CKMB, CKMBINDEX, TROPONINI in the last 168 hours. BNP (last 3 results) No results for  input(s): BNP in the last 8760 hours.  ProBNP (last 3 results) No results for input(s): PROBNP in the last 8760 hours.  CBG:  Recent Labs Lab 10/04/15 0734 10/04/15 1140 10/04/15 1654 10/05/15 0706 10/05/15 1220  GLUCAP 89 133* 137* 108* 98    Recent Results (from the past 240 hour(s))  Blood Culture (routine x 2)     Status: None (Preliminary result)   Collection Time: 10/02/15 10:50 AM  Result Value Ref Range Status   Specimen Description BLOOD CHEST PORT  3 ML IN YELLOW BOTTLE  Final   Special Requests NONE  Final   Culture   Final    NO GROWTH 3 DAYS Performed at [facility]    Report Status PENDING  Incomplete  Blood Culture (routine x 2)     Status: None (Preliminary result)   Collection Time: 10/02/15 11:00 AM  Result Value Ref Range Status   Specimen Description BLOOD LEFT HAND  1 ML IN YELLOW BOTTLE  Final   Special Requests NONE  Final   Culture   Final    NO GROWTH 3 DAYS Performed at [facility]    Report Status PENDING  Incomplete  Urine culture     Status: None   Collection Time: 10/02/15  1:45 PM  Result Value Ref Range Status   Specimen Description URINE, CLEAN CATCH  Final   Special Requests Immunocompromised  Final   Culture   Final    NO GROWTH 1 DAY Performed at [facility]    Report Status 10/03/2015 FINAL  Final  MRSA PCR Screening     Status: None   Collection Time: 10/02/15  3:50 PM  Result Value Ref Range Status   MRSA by PCR NEGATIVE NEGATIVE Final    Comment:        The GeneXpert MRSA Assay (FDA approved for NASAL specimens only), is one component of a comprehensive MRSA colonization surveillance program. It is not intended to diagnose MRSA infection nor to guide or monitor treatment for MRSA infections.      Studies: No results found.  Scheduled Meds: . albumin human  50 g Intravenous Once  . antiseptic oral rinse  7 mL Mouth Rinse BID  . feeding supplement (ENSURE ENLIVE)  237 mL Oral BID BM   . furosemide  40 mg Intravenous BID   Continuous Infusions: . sodium chloride 10 mL/hr at 10/03/15 0912  . heparin 1,850 Units/hr (10/05/15 0741)       Time spent: 25 minutes    Melinda Pottinger  [facility] Pager [phone]. If 7PM-7AM, please contact night-coverage at [email or web address], password [facility] 10/05/2015, 3:33 PM  LOS: 3 days

## 2015-10-05 NOTE — Progress Notes (Signed)
Brief Anticoagulation Follow Up Consult  Pharmacy Consult for Heparin Indication: pulmonary embolus and DVT  Medications:  Infusions:  . sodium chloride 10 mL/hr at 10/03/15 0912  . heparin 1,850 Units/hr (10/05/15 0741)    Assessment: 38 yoM with colorectal cancer currently undergoing chemotherapy (FOLFOX q14d) presents on 10/2 with weakness, hypotension. Lactic acid elevated, code sepsis called, and started on broad spectrum antibiotics. He was also found to have RV dilation on cardiac [place]; CT confirmed saddle PE and Dopplers revealed bilateral DVTs.  Today, 10/05/2015 @ 1400 Update:  Heparin level increased to 0.48, therapeutic  Rn reports no IV complications, bleeding or adverse events.   Goal of Therapy:  Heparin level 0.3-0.7 units/ml Monitor platelets by anticoagulation protocol: Yes   Plan:   Continue heparin IV infusion at 1850 units/hr  Heparin level in 6 hours to confirm.  Daily heparin level and CBC  Continue to monitor H&H and platelets   Gretta Arab PharmD, BCPS Pager [phone] 10/05/2015 3:02 PM

## 2015-10-05 NOTE — Telephone Encounter (Signed)
FYI Call from patient's wife requesting "Social worker I spoke with before.  Call from husbands job that he will lose insurance."  Call transferred to [employer].  Request advice on disability.

## 2015-10-05 NOTE — Procedures (Signed)
Ultrasound-guided diagnostic and therapeutic paracentesis performed yielding 6 L (maximum ordered) yellow fluid. A portion of the fluid was submitted to the lab for preordered studies. No immediate complications.

## 2015-10-05 NOTE — Care Management Note (Signed)
Case Management Note  Patient Details  Name: Jaime [name] MRN: [id number] Date of Birth: 25-Apr-1956  Subjective/Objective: 59 y/o m admitted w/PE,dvt.From home.                   Action/Plan:d/c plan home.   Expected Discharge Date:                 Expected Discharge Plan:  Home/Self Care  In-House Referral:     Discharge planning Services  CM Consult  Post Acute Care Choice:    Choice offered to:     DME Arranged:    DME Agency:     HH Arranged:    HH Agency:     Status of Service:  In process, will continue to follow  Medicare Important Message Given:    Date Medicare IM Given:    Medicare IM give by:    Date Additional Medicare IM Given:    Additional Medicare Important Message give by:     If discussed at [place] of Stay Meetings, dates discussed:    Additional Comments:  Dessa Phi, RN 10/05/2015, 4:40 PM

## 2015-10-06 ENCOUNTER — Ambulatory Visit: Payer: Managed Care, Other (non HMO)

## 2015-10-06 ENCOUNTER — Encounter: Payer: Self-pay | Admitting: *Deleted

## 2015-10-06 LAB — GLUCOSE, CAPILLARY
GLUCOSE-CAPILLARY: 99 mg/dL (ref 65–99)
GLUCOSE-CAPILLARY: 113 mg/dL — AB (ref 65–99)
Glucose-Capillary: 135 mg/dL — ABNORMAL HIGH (ref 65–99)

## 2015-10-06 LAB — HEPARIN LEVEL (UNFRACTIONATED)
Heparin Unfractionated: 0.28 IU/mL — ABNORMAL LOW (ref 0.30–0.70)
Heparin Unfractionated: 0.22 IU/mL — ABNORMAL LOW (ref 0.30–0.70)
Heparin Unfractionated: 0.4 IU/mL (ref 0.30–0.70)

## 2015-10-06 LAB — CBC
HCT: 31.4 % — ABNORMAL LOW (ref 39.0–52.0)
HEMOGLOBIN: 9.8 g/dL — AB (ref 13.0–17.0)
MCH: 27.5 pg (ref 26.0–34.0)
MCHC: 31.2 g/dL (ref 30.0–36.0)
MCV: 88.2 fL (ref 78.0–100.0)
Platelets: 150 10*3/uL (ref 150–400)
RBC: 3.56 MIL/uL — AB (ref 4.22–5.81)
RDW: 17.5 % — ABNORMAL HIGH (ref 11.5–15.5)
WBC: 4.4 10*3/uL (ref 4.0–10.5)

## 2015-10-06 MED ORDER — POTASSIUM CHLORIDE CRYS ER 20 MEQ PO TBCR
40.0000 meq | EXTENDED_RELEASE_TABLET | Freq: Once | ORAL | Status: AC
  Administered 2015-10-06: 40 meq via ORAL
  Filled 2015-10-06: qty 2

## 2015-10-06 MED ORDER — HYDRALAZINE HCL 20 MG/ML IJ SOLN
10.0000 mg | Freq: Four times a day (QID) | INTRAMUSCULAR | Status: DC | PRN
Start: 2015-10-06 — End: 2015-10-12

## 2015-10-06 MED ORDER — HEPARIN (PORCINE) IN NACL 100-0.45 UNIT/ML-% IJ SOLN
2000.0000 [IU]/h | INTRAMUSCULAR | Status: AC
  Administered 2015-10-06 (×2): 2000 [IU]/h via INTRAVENOUS
  Filled 2015-10-06 (×2): qty 250

## 2015-10-06 MED ORDER — RIVAROXABAN 15 MG PO TABS
15.0000 mg | ORAL_TABLET | Freq: Two times a day (BID) | ORAL | Status: DC
  Administered 2015-10-07 – 2015-10-12 (×11): 15 mg via ORAL
  Filled 2015-10-06 (×12): qty 1

## 2015-10-06 MED ORDER — HEPARIN BOLUS VIA INFUSION
1500.0000 [IU] | Freq: Once | INTRAVENOUS | Status: AC
  Administered 2015-10-06: 1500 [IU] via INTRAVENOUS
  Filled 2015-10-06: qty 1500

## 2015-10-06 NOTE — Progress Notes (Signed)
Continues on IV Heparin. Discharge Plan continues to be Home with spouse. Will continue to follow.

## 2015-10-06 NOTE — Progress Notes (Signed)
Jaime [name]  Clinical Social [name] was referred by nurse for assessment of psychosocial needs due to questions of how to apply for ss disability.  Clinical Social Worker contacted wife via phone to explain process of applying for ss disability. CSW attempted to answer questions around this issue as well. They are currently in the process of exploring [facility] as a short term option as well. Supportive listening provided. CSW also explained role of CSWs at [facility] and how Patient and Family Support team could assist pt and family. Wife agrees to reach out as needed and CSW team will follow outpt.   Clinical Social [name] interventions: Resource education Supportive listening  Loren Racer, [place] Worker [place]  [place] Phone: [phone] Fax: [phone]

## 2015-10-06 NOTE — Progress Notes (Signed)
ANTICOAGULATION CONSULT NOTE - Follow Up Consult  Pharmacy Consult for Heparin, Xarelto Indication: pulmonary embolus and DVT  No Known Allergies  Patient Measurements: Height: 6\' 3"  (190.5 cm) Weight: 248 lb 0.3 oz (112.5 kg) IBW/kg (Calculated) : 84.5 Heparin Dosing Weight: 108 kg  Vital Signs: Temp: 98 F (36.7 C) (10/06 2048) Temp Source: Oral (10/06 2048) BP: 155/91 mmHg (10/06 2048) Pulse Rate: 79 (10/06 2048)  Labs:  Recent Labs  10/04/15 0435 10/04/15 1440 10/05/15 0445  10/06/15 0519 10/06/15 1418 10/06/15 2103  HGB 9.5*  --  10.1*  --  9.8*  --   --   HCT 31.0*  --  32.8*  --  31.4*  --   --   PLT 154  --  176  --  150  --   --   HEPARINUNFRC 0.41  --  0.20*  < > 0.28* 0.22* 0.40  CREATININE  --  0.79 0.79  --   --   --   --   < > = values in this interval not displayed.  Estimated Creatinine Clearance: 132.9 mL/min (by C-G formula based on Cr of 0.79).  Medications:  Infusions:  . sodium chloride 10 mL/hr at 10/06/15 0626  . heparin 2,000 Units/hr (10/06/15 1744)    Assessment: 46 yoM with colorectal cancer currently undergoing chemotherapy (FOLFOX q14d) presents on 10/2 with weakness, hypotension. Lactic acid elevated, code sepsis called, and started on broad spectrum antibiotics. He was also found to have RV dilation on cardiac [place]; CT confirmed saddle PE and Dopplers revealed bilateral DVTs.  Pharmacy was consulted to start Heparin drip, then transition to [place] on 10/7.  Today, 10/06/2015:  Heparin level: 0.40, therapeutic with infusion at 2000 units/hr  CBC:  Hgb low/stable at 9.8, Plt remain WNL.  Goal of Therapy:  Heparin level 0.3-0.7 units/ml Monitor platelets by anticoagulation protocol: Yes   Plan:   Continue heparin IV infusion at 2000 units/hr  Recheck heparin level at 0300 to confirm rate  Transition to Xarelto on 10/07/15 AM  10/7 at 0800, stop Heparin and start Xarelto 15 mg BID with meals x21 days, then change to 20mg  once  daily with supper.  Pharmacy to provide education prior to discharge.   Hershal Coria, PharmD, BCPS Pager: [phone] 10/06/2015 9:52 PM

## 2015-10-06 NOTE — Discharge Instructions (Signed)
Information on my medicine - XARELTO (rivaroxaban)  This medication education was reviewed with me or my healthcare representative as part of my discharge preparation.  The pharmacist that spoke with me during my hospital stay was:  [place]? Xarelto was prescribed to treat blood clots that may have been found in the veins of your legs (deep vein thrombosis) or in your lungs (pulmonary embolism) and to reduce the risk of them occurring again.  What do you need to know about Xarelto? The starting dose is one 15 mg tablet taken TWICE daily with food for the FIRST 21 DAYS then on (enter date)  10/28/15  the dose is changed to one 20 mg tablet taken ONCE A DAY with your evening meal.  DO NOT stop taking Xarelto without talking to the health care provider who prescribed the medication.  Refill your prescription for 20 mg tablets before you run out.  After discharge, you should have regular check-up appointments with your healthcare provider that is prescribing your Xarelto.  In the future your dose may need to be changed if your kidney function changes by a significant amount.  What do you do if you miss a dose? If you are taking Xarelto TWICE DAILY and you miss a dose, take it as soon as you remember. You may take two 15 mg tablets (total 30 mg) at the same time then resume your regularly scheduled 15 mg twice daily the next day.  If you are taking Xarelto ONCE DAILY and you miss a dose, take it as soon as you remember on the same day then continue your regularly scheduled once daily regimen the next day. Do not take two doses of Xarelto at the same time.   Important Safety Information Xarelto is a blood thinner medicine that can cause bleeding. You should call your healthcare provider right away if you experience any of the following: ? Bleeding from an injury or your nose that does not stop. ? Unusual colored urine (red or dark brown) or  unusual colored stools (red or black). ? Unusual bruising for unknown reasons. ? A serious fall or if you hit your head (even if there is no bleeding).  Some medicines may interact with Xarelto and might increase your risk of bleeding while on Xarelto. To help avoid this, consult your healthcare provider or pharmacist prior to using any new prescription or non-prescription medications, including herbals, vitamins, non-steroidal anti-inflammatory drugs (NSAIDs) and supplements.  This website has more information on Xarelto: [email or web address]

## 2015-10-06 NOTE — Progress Notes (Signed)
Brief Anticoagulation Follow Up Consult  Pharmacy Consult for Heparin Indication: pulmonary embolus and DVT  Medications:  Infusions:  . sodium chloride 10 mL/hr at 10/03/15 0912  . heparin 1,850 Units/hr (10/06/15 0353)    Assessment: 31 yoM with colorectal cancer currently undergoing chemotherapy (FOLFOX q14d) presents on 10/2 with weakness, hypotension. Lactic acid elevated, code sepsis called, and started on broad spectrum antibiotics. He was also found to have RV dilation on cardiac [place]; CT confirmed saddle PE and Dopplers revealed bilateral DVTs.  10/04/15 @ 1400 Update:  Heparin level increased to 0.48, therapeutic  Rn reports no IV complications, bleeding or adverse events.  Today, 10/05/15  2219=0.35 (confirmatory), no problems reported per RN  0519 HL=0.28, spoke with RN, IV came out ~ 0330 and was restarted ~0400 lab drawn 0519.   Goal of Therapy:  Heparin level 0.3-0.7 units/ml Monitor platelets by anticoagulation protocol: Yes   Plan:   Rebolus with 1500 units x1 now  Continue heparin IV infusion at 1850 units/hr  Daily heparin level and CBC  Continue to monitor H&H and platelets  Recheck HL in 6  hours   Dorrene German 10/06/2015 6:10 AM

## 2015-10-06 NOTE — Progress Notes (Signed)
IP PROGRESS NOTE  Subjective:  No pain. He remains bedbound. He underwent a therapeutic paracentesis yesterday.  Objective: Vital signs in last 24 hours: Blood pressure 170/85, pulse 69, temperature 98.1 F (36.7 C), temperature source Oral, resp. rate 18, height 6\' 3"  (1.905 m), weight 248 lb 0.3 oz (112.5 kg), SpO2 95 %.  Intake/Output from previous day: 10/05 0701 - 10/06 0700 In: 1482.3 [P.O.:800; I.V.:682.3] Out: 4350 [Urine:4350]  Physical Exam:   Abdomen: Distended, the liver edge is palpable in the medial right subcostal region Extremities: Pitting edema throughout the legs bilaterally, left greater than right-improved   Portacath/PICC-without erythema  Lab Results:  Recent Labs  10/05/15 0445 10/06/15 0519  WBC 4.5 4.4  HGB 10.1* 9.8*  HCT 32.8* 31.4*  PLT 176 150    BMET  Recent Labs  10/04/15 1440 10/05/15 0445  NA 137 140  K 3.4* 3.5  CL 108 108  CO2 23 25  GLUCOSE 147* 110*  BUN 17 16  CREATININE 0.79 0.79  CALCIUM 7.7* 7.8*    Medications: I have reviewed the patient's current medications.  Assessment/Plan:  1. 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, status post a colonoscopy 07/21/2015 confirming a sigmoid colon mass  Staging CT scans 07/13/2015 and 07/14/2015 confirmed extensive liver metastases, lung nodules, a right pleural effusion, and porta hepatis lymphadenopathy  Cycle 1 FOLFOX (chemotherapy doses reduced and bolus 5-FU eliminated) 08/02/2015  Cycle 2 FOLFOX (chemotherapy doses reduced and bolus 5-FU eliminated) 08/16/2015  Cycle 3 FOLFOX (Avastin added, bolus 5-FU introduced, and infusional 5-FU dose escalated) 08/30/2015  Cycle 4 FOLFOX plus Avastin 09/13/2015  Cycle 5 held on 09/27/2015 due to mild neutropenia, recent diarrhea.  CT of the chest 10/02/2015, compared to a CT from 07/13/2015-no significant change in lung nodules or liver metastases  2. Obstructive jaundice, likely secondary to porta hepatis  lymphadenopathy, status post placement of a bile duct stent on 07/14/2015  3. Microcytic anemia secondary to the colon mass-improved  4. Diabetes  5. Failure to thrive secondary to metastatic colon cancer  6. Port-A-Cath placement 08/01/2015  7. Nausea following cycle 1 and cycle 2 FOLFOX-emend added with cycle 3  8. Diarrhea following cycle 4 FOLFOX/Avastin.  9. Neutropenia following cycle 4 FOLFOX/Avastin. Neulasta to be added with cycle 5.  10.  Admission 10/02/2015 with bilateral DVTs and pulmonary emboli-maintained on heparin  Mr. Koy continues to undergo diuresis for the anasarca. He continues heparin anticoagulation. I reviewed the admission chest CT and compared to the pretreatment CTs in radiology. There has been no significant change in the metastatic lung nodules and liver lesions. I discussed the CT review with Mr. Ceesay and his wife. The plan is to continue FOLFOX/Avastin if his performance status improves over the next one to 2 weeks. If he does not improve significantly we will consider FOLFIRI chemotherapy. He qualifies for complete medical disability. His wife will meet with the social worker to discuss moving forward with the disability process.  He can be switched to xarelto anticoagulation beginning 10/07/2015.  He needs to participate in physical therapy so that a disposition plan can be determined.    Recommendations: 1. Continue heparin , plan for change to Xarelto beginning 10/07/2015 2. Follow-up peritoneal fluid cytology 3. Increase ambulation as tolerated, physical therapy evaluation 4. Oncology will continue following him daily in the hospital.     LOS: 4 days   Casidy Alberta  10/06/2015, 2:04 PM

## 2015-10-06 NOTE — Progress Notes (Signed)
[facility] PROGRESS NOTE  Jaime [name] WGN:[id number] DOB: 04-18-1956 DOA: 10/02/2015 PCP:  Velna Hatchet, MD  Brief narrative 59 year old male with stage IV colon cancer diagnosed in July 16 started on chemotherapy with FOLFOX, added Avastin on 08/30/2015 with last chemotherapy about 3 weeks prior to admission who presented to the ED with increased weakness for the past 2 days and increased left leg swelling 2 weeks duration. Patient was tachycardic and hypotensive in the ED and was given empiric antibiotic with concern for sepsis. However no clinical source of infection was found and a CT angiogram of the chest showed saddle PE. Patient was admitted to ICU with septic shock and initiated levophed in the ED. He was also found to have bilateral lower leg DVT. Patient started on IV heparin and transferred to hospitalist service on 10/4.  Assessment/Plan: Saddle PE with bilateral lower leg extremity DVT Risk factor includes underlying malignancy. On IV heparin for 5 days and then transitioned to oral Xarelto in am. Hemodynamically stable at this time. He will possibly need lifelong  anticoagulation.    metastatic stage IV colon cancer  multiple large hepatic metastasis with ascites. . Underwent therapeutic paracentesis 6 L yellow fluid removed and feels much better today. Dr. Learta Codding following. Peritoneal fluid cytology sent.  Abdominal ascites with anasarca  secondary to hepatic metastatis and hypoproteinemia Doubled lasix  dose as blood pressure stable. Received IV albumin with paracentesis. Abdominal distention and leg swelling much improved.  Hypotension  on admission with saddle PE. Off pressors. BP elevated. Increased lasix dose.  PAF Converted to sinus rhythm. Cardiology was consulted by [facility].  Chr diarrhea  improved with lomotil  generalized weakness with failure tothrive Secondary to above issues. PT eval  Anemia  secondary to malignancy and  chemo. Monitor.  DVT Prophylaxis: IV heaprin  DIET: REGULAR  Code Status: full code Family Communication: wife at bedside Disposition Plan: home  possibly in next 2-3 days if tolerating PT.   Consultants:  PCCM  Oncology    Procedures:  US paracentesis  Antibiotics: In ED  HPI/Subjective: Patient seen and examined . Abdominal distention and leg swelling much improved after paracentesis done yesterday. Denies any symptoms.  Objective: Filed Vitals:   10/06/15 0411  BP: 170/85  Pulse: 69  Temp: 98.1 F (36.7 C)  Resp: 18    Intake/Output Summary (Last 24 hours) at 10/06/15 1436 Last data filed at 10/06/15 1300  Gross per 24 hour  Intake 1162.29 ml  Output   6800 ml  Net -5637.71 ml   Filed Weights   10/02/15 1042 10/02/15 1558 10/06/15 0411  Weight: 113.399 kg (250 lb) 122.7 kg (270 lb 8.1 oz) 112.5 kg (248 lb 0.3 oz)    Exam:   General:  NAD, fatigued  HEENT: pallor+, moist mucosa  Chest: clear b/l   CVS: NS1&S2  GI: Minimal abdominal distention. Nontender, bowel sounds present  Musculoskeletal: 1+ pitting edema B/L (improved after paracentesis)  CNS: alert and oriented.   Data Reviewed: Basic Metabolic Panel:  Recent Labs Lab 10/02/15 1045 10/03/15 0535 10/04/15 1440 10/05/15 0445  NA 139 137 137 140  K 3.7 3.7 3.4* 3.5  CL 110 108 108 108  CO2 19* 22 23 25   GLUCOSE 90 106* 147* 110*  BUN 17 19 17 16   CREATININE 0.92 0.83 0.79 0.79  CALCIUM 7.9* 7.4* 7.7* 7.8*  MG  --   --  1.4*  --    Liver Function Tests:  Recent Labs Lab 10/02/15 1045 10/03/15  0535  AST 26 22  ALT 9* 9*  ALKPHOS 258* 216*  BILITOT 1.6* 1.2  PROT 5.4* 5.2*  ALBUMIN 1.8* 1.7*   No results for input(s): LIPASE, AMYLASE in the last 168 hours. No results for input(s): AMMONIA in the last 168 hours. CBC:  Recent Labs Lab 10/02/15 1045 10/02/15 1624 10/03/15 1625 10/04/15 0435 10/05/15 0445 10/06/15 0519  WBC 1.4* 2.9* 4.6 3.5* 4.5 4.4   NEUTROABS 0.5* 1.5*  --   --   --   --   HGB 10.3* 9.8* 10.2* 9.5* 10.1* 9.8*  HCT 33.0* 31.6* 32.7* 31.0* 32.8* 31.4*  MCV 89.2 88.5 88.9 88.8 88.2 88.2  PLT 178 158 170 154 176 150   Cardiac Enzymes: No results for input(s): CKTOTAL, CKMB, CKMBINDEX, TROPONINI in the last 168 hours. BNP (last 3 results) No results for input(s): BNP in the last 8760 hours.  ProBNP (last 3 results) No results for input(s): PROBNP in the last 8760 hours.  CBG:  Recent Labs Lab 10/05/15 0706 10/05/15 1220 10/05/15 1647 10/06/15 0829 10/06/15 1247  GLUCAP 108* 98 103* 99 113*    Recent Results (from the past 240 hour(s))  Blood Culture (routine x 2)     Status: None (Preliminary result)   Collection Time: 10/02/15 10:50 AM  Result Value Ref Range Status   Specimen Description BLOOD CHEST PORT  3 ML IN YELLOW BOTTLE  Final   Special Requests NONE  Final   Culture   Final    NO GROWTH 4 DAYS Performed at [facility]    Report Status PENDING  Incomplete  Blood Culture (routine x 2)     Status: None (Preliminary result)   Collection Time: 10/02/15 11:00 AM  Result Value Ref Range Status   Specimen Description BLOOD LEFT HAND  1 ML IN YELLOW BOTTLE  Final   Special Requests NONE  Final   Culture   Final    NO GROWTH 4 DAYS Performed at [facility]    Report Status PENDING  Incomplete  Urine culture     Status: None   Collection Time: 10/02/15  1:45 PM  Result Value Ref Range Status   Specimen Description URINE, CLEAN CATCH  Final   Special Requests Immunocompromised  Final   Culture   Final    NO GROWTH 1 DAY Performed at [facility]    Report Status 10/03/2015 FINAL  Final  MRSA PCR Screening     Status: None   Collection Time: 10/02/15  3:50 PM  Result Value Ref Range Status   MRSA by PCR NEGATIVE NEGATIVE Final    Comment:        The GeneXpert MRSA Assay (FDA approved for NASAL specimens only), is one component of a comprehensive MRSA  colonization surveillance program. It is not intended to diagnose MRSA infection nor to guide or monitor treatment for MRSA infections.      Studies: US Paracentesis  10/05/2015   INDICATION: Metastatic colon carcinoma, recent PE/DVT, ascites. Request is made for diagnostic and therapeutic paracentesis up to 6 liters.  EXAM: ULTRASOUND-GUIDED DIAGNOSTIC AND THERAPEUTIC PARACENTESIS  COMPARISON:  None.  MEDICATIONS: None.  COMPLICATIONS: None immediate  TECHNIQUE: Informed written consent was obtained from the patient after a discussion of the risks, benefits and alternatives to treatment. A timeout was performed prior to the initiation of the procedure.  Initial ultrasound scanning demonstrates a large amount of ascites within the left lower abdominal quadrant. The left lower abdomen was  prepped and draped in the usual sterile fashion. 1% lidocaine was used for local anesthesia. Under direct ultrasound guidance, a 19 gauge, 10-cm, Yueh catheter was introduced. An ultrasound image was saved for documentation purposed. The paracentesis was performed. The catheter was removed and a dressing was applied. The patient tolerated the procedure well without immediate post procedural complication.  FINDINGS: A total of approximately 6 liters of yellow fluid was removed. Samples were sent to the laboratory as requested by the clinical team.  IMPRESSION: Successful ultrasound-guided diagnostic and therapeutic paracentesis yielding 6 liters of peritoneal fluid.  Read by: Rowe Robert, PA-C   Electronically Signed   By: Jerilynn Mages.  Shick M.D.   On: 10/05/2015 16:24    Scheduled Meds: . antiseptic oral rinse  7 mL Mouth Rinse BID  . feeding supplement (ENSURE ENLIVE)  237 mL Oral BID BM  . furosemide  40 mg Intravenous BID  . [START ON 10/07/2015] Rivaroxaban  15 mg Oral BID WC   Continuous Infusions: . sodium chloride 10 mL/hr at 10/06/15 0626  . heparin 1,850 Units/hr (10/06/15 0353)       Time spent: 25  minutes    Anica Alcaraz, [place]  [facility] Pager [phone]. If 7PM-7AM, please contact night-coverage at [email or web address], password [facility] 10/06/2015, 2:36 PM  LOS: 4 days

## 2015-10-06 NOTE — Progress Notes (Signed)
OT Cancellation Note  Patient Details Name: Jaime [name] MRN: [id number] DOB: Mar 21, 1956   Cancelled Treatment:    Reason Eval/Treat Not Completed: Other (comment) Per nursing, pt with a recheck of heparin level soon (note IV out and restarted). Will await therapeutic heparin level before initiating therapy.   Jules Schick  [phone] 10/06/2015, 12:37 PM

## 2015-10-06 NOTE — Progress Notes (Signed)
ANTICOAGULATION CONSULT NOTE - Follow Up Consult  Pharmacy Consult for Heparin, Xarelto Indication: pulmonary embolus and DVT  No Known Allergies  Patient Measurements: Height: 6\' 3"  (190.5 cm) Weight: 248 lb 0.3 oz (112.5 kg) IBW/kg (Calculated) : 84.5 Heparin Dosing Weight: 108 kg  Vital Signs: Temp: 98.1 F (36.7 C) (10/06 0411) Temp Source: Oral (10/06 0411) BP: 170/85 mmHg (10/06 0411) Pulse Rate: 69 (10/06 0411)  Labs:  Recent Labs  10/04/15 0435 10/04/15 1440 10/05/15 0445  10/05/15 2219 10/06/15 0519 10/06/15 1418  HGB 9.5*  --  10.1*  --   --  9.8*  --   HCT 31.0*  --  32.8*  --   --  31.4*  --   PLT 154  --  176  --   --  150  --   HEPARINUNFRC 0.41  --  0.20*  < > 0.35 0.28* 0.22*  CREATININE  --  0.79 0.79  --   --   --   --   < > = values in this interval not displayed.  Estimated Creatinine Clearance: 132.9 mL/min (by C-G formula based on Cr of 0.79).  Medications:  Infusions:  . sodium chloride 10 mL/hr at 10/06/15 0626  . heparin 1,850 Units/hr (10/06/15 0353)    Assessment: 54 yoM with colorectal cancer currently undergoing chemotherapy (FOLFOX q14d) presents on 10/2 with weakness, hypotension. Lactic acid elevated, code sepsis called, and started on broad spectrum antibiotics. He was also found to have RV dilation on cardiac [place]; CT confirmed saddle PE and Dopplers revealed bilateral DVTs.  Pharmacy was consulted to start Heparin drip, then transition to [place] on 10/7.  Today, 10/06/2015:  Heparin level: 0.22, subtherapeutic  CBC:  Hgb low/stable at 9.8, Plt remain WNL.  Rn reports IV access was interrupted early this morning about 0330, with subsequent low HL.  Heparin was bolused and resumed at previous rate.  No further IV complications, bleeding or adverse events reported.  Goal of Therapy:  Heparin level 0.3-0.7 units/ml Monitor platelets by anticoagulation protocol: Yes   Plan:   Give heparin 1500 units bolus IV x 1  Increase  to heparin IV infusion at 2000 units/hr  Heparin level 6 hours after rate change  Daily heparin level and CBC  Continue to monitor H&H and platelets  Transition to Xarelto on 10/07/15 AM.  10/7 at 0800, stop Heparin and start Xarelto 15 mg BID with meals x21 days, then change to 20mg  once daily with supper.  Pharmacy to provide education prior to discharge.   Gretta Arab PharmD, BCPS Pager [phone] 10/06/2015 3:00 PM

## 2015-10-06 NOTE — Progress Notes (Signed)
PT Cancellation Note  Patient Details Name: Jonovan Boedecker MRN: [id number] DOB: 1956-06-07   Cancelled Treatment:    Reason Eval/Treat Not Completed: Medical issues which prohibited therapy (heparin level remains subtherapeutic this afternoon at 0.22)   Mosetta Ferdinand,KATHrine E 10/06/2015, 2:52 PM Carmelia Bake, PT, DPT 10/06/2015 Pager: [phone]

## 2015-10-07 LAB — GLUCOSE, CAPILLARY
GLUCOSE-CAPILLARY: 94 mg/dL (ref 65–99)
GLUCOSE-CAPILLARY: 126 mg/dL — AB (ref 65–99)
Glucose-Capillary: 162 mg/dL — ABNORMAL HIGH (ref 65–99)

## 2015-10-07 LAB — CULTURE, BLOOD (ROUTINE X 2)
CULTURE: NO GROWTH
Culture: NO GROWTH

## 2015-10-07 LAB — HEPARIN LEVEL (UNFRACTIONATED): Heparin Unfractionated: 0.39 IU/mL (ref 0.30–0.70)

## 2015-10-07 NOTE — Evaluation (Signed)
Physical Therapy Evaluation Patient Details Name: Jaime [name] MRN: [id number] DOB: 1956/05/05 Today's Date: 10/07/2015   History of Present Illness  59 yo male admitted with acute PE, bil DVT LEs. Hx of stage IV colon cancer, DM, HTN  Clinical Impression  On eval, pt required Min assist +2 for safety-walked ~10 feet in room with RW. Pt refused to ambulate farther/into hallway on today. Pt is fearful of falling-requires encouragement and assurances throughout session. Wife present and assisted with encouraging pt. Will continue to follow and progress activity as able. May need to consider wheelchair. Encouraged pt to get OOB with nursing over weekend.     Follow Up Recommendations Home health PT;Supervision/Assistance - 24 hour    Equipment Recommendations  Wheelchair  (depending on progress)    Recommendations for Other Services       Precautions / Restrictions Precautions Precautions: Fall Restrictions Weight Bearing Restrictions: No      Mobility  Bed Mobility Overal bed mobility: Needs Assistance Bed Mobility: Supine to Sit     Supine to sit: HOB elevated;Min guard     General bed mobility comments: close guard for safety. Increased time.   Transfers Overall transfer level: Needs assistance Equipment used: Rolling walker (2 wheeled) Transfers: Sit to/from Stand Sit to Stand: Min assist;+2 physical assistance;+2 safety/equipment;From elevated surface         General transfer comment: Assist to rise, stabilize, control descent. VCs safety, technique, hand placement.   Ambulation/Gait Ambulation/Gait assistance: Min assist;+2 physical assistance;+2 safety/equipment Ambulation Distance (Feet): 10 Feet Assistive device: Rolling walker (2 wheeled) Gait Pattern/deviations: Decreased stride length;Step-through pattern;Trunk flexed     General Gait Details: Pt walked short distance in room. Pt refused to ambulate farther/out into hallway. Assist to stabilize. Pt  is feaful of falling.   Stairs            Wheelchair Mobility    Modified Rankin (Stroke Patients Only)       Balance Overall balance assessment: Needs assistance         Standing balance support: Bilateral upper extremity supported;During functional activity Standing balance-Leahy Scale: Poor                               Pertinent Vitals/Pain Pain Assessment: No/denies pain    Home Living Family/patient expects to be discharged to:: Private residence Living Arrangements: Spouse/significant other   Type of Home: House Home Access: Stairs to enter;Ramped entrance     Home Layout: One level Home Equipment: Environmental consultant - 2 wheels;Cane - single point;Bedside commode;Shower seat      Prior Function           Comments: using RW     Hand Dominance        Extremity/Trunk Assessment   Upper Extremity Assessment: Defer to OT evaluation           Lower Extremity Assessment: Generalized weakness;RLE deficits/detail;LLE deficits/detail RLE Deficits / Details: edema LLE Deficits / Details: edema  Cervical / Trunk Assessment: Kyphotic  Communication   Communication: No difficulties  Cognition Arousal/Alertness: Awake/alert Behavior During Therapy: Anxious (fearful of falling) Overall Cognitive Status: Within Functional Limits for tasks assessed                      General Comments      Exercises General Exercises - Lower Extremity Long Arc Quad: AROM;Both;10 reps;Seated      Assessment/Plan    PT Assessment  Patient needs continued PT services  PT Diagnosis Difficulty walking;Generalized weakness   PT Problem List Decreased strength;Decreased activity tolerance;Decreased balance;Decreased mobility;Decreased knowledge of use of DME  PT Treatment Interventions DME instruction;Gait training;Functional mobility training;Therapeutic activities;Patient/family education;Balance training;Therapeutic exercise   PT Goals (Current  goals can be found in the Care Plan section) Acute Rehab PT Goals Patient Stated Goal: to get stronger PT Goal Formulation: With patient/family Time For Goal Achievement: 10/21/15 Potential to Achieve Goals: Good    Frequency Min 3X/week   Barriers to discharge        Co-evaluation               End of Session   Activity Tolerance: Patient tolerated treatment well Patient left: in chair;with call bell/phone within reach;with family/visitor present           Time: 5093-2671 PT Time Calculation (min) (ACUTE ONLY): 23 min   Charges:   PT Evaluation $Initial PT Evaluation Tier I: 1 Procedure PT Treatments $Gait Training: 8-22 mins   PT G Codes:        Weston Anna, MPT Pager: [phone]

## 2015-10-07 NOTE — Progress Notes (Signed)
ANTICOAGULATION CONSULT NOTE - Follow Up Consult  Pharmacy Consult for Heparin, Xarelto Indication: pulmonary embolus and DVT  No Known Allergies  Patient Measurements: Height: 6\' 3"  (190.5 cm) Weight: 248 lb 0.3 oz (112.5 kg) IBW/kg (Calculated) : 84.5 Heparin Dosing Weight: 108 kg  Vital Signs: Temp: 98 F (36.7 C) (10/06 2048) Temp Source: Oral (10/06 2048) BP: 155/91 mmHg (10/06 2048) Pulse Rate: 79 (10/06 2048)  Labs:  Recent Labs  10/04/15 0435 10/04/15 1440 10/05/15 0445  10/06/15 0519 10/06/15 1418 10/06/15 2103 10/07/15 0300  HGB 9.5*  --  10.1*  --  9.8*  --   --   --   HCT 31.0*  --  32.8*  --  31.4*  --   --   --   PLT 154  --  176  --  150  --   --   --   HEPARINUNFRC 0.41  --  0.20*  < > 0.28* 0.22* 0.40 0.39  CREATININE  --  0.79 0.79  --   --   --   --   --   < > = values in this interval not displayed.  Estimated Creatinine Clearance: 132.9 mL/min (by C-G formula based on Cr of 0.79).  Medications:  Infusions:  . sodium chloride 10 mL/hr at 10/06/15 0626  . heparin 2,000 Units/hr (10/06/15 1744)    Assessment: 52 yoM with colorectal cancer currently undergoing chemotherapy (FOLFOX q14d) presents on 10/2 with weakness, hypotension. Lactic acid elevated, code sepsis called, and started on broad spectrum antibiotics. He was also found to have RV dilation on cardiac [place]; CT confirmed saddle PE and Dopplers revealed bilateral DVTs.  Pharmacy was consulted to start Heparin drip, then transition to [place] on 10/7.  10/06/15  Heparin level: 0.40, therapeutic with infusion at 2000 units/hr  CBC:  Hgb low/stable at 9.8, Plt remain WNL.  Today, 10/07/15  0300 HL=0.39, on 2000 units/hr, no problems reported.   Goal of Therapy:  Heparin level 0.3-0.7 units/ml Monitor platelets by anticoagulation protocol: Yes   Plan:   Continue heparin IV infusion at 2000 units/hr  Transition to Xarelto on 10/07/15 AM  10/7 at 0800, stop Heparin and start  Xarelto 15 mg BID with meals x21 days, then change to 20mg  once daily with supper.  Pharmacy to provide education prior to discharge.    Dorrene German 10/07/2015 3:47 AM

## 2015-10-07 NOTE — Progress Notes (Signed)
[facility] PROGRESS NOTE  Jaime [name] HYW:[id number] DOB: March 28, 1956 DOA: 10/02/2015 PCP:  Velna Hatchet, MD  Brief narrative 59 year old male with stage IV colon cancer diagnosed in July 16 started on chemotherapy with FOLFOX, added Avastin on 08/30/2015 with last chemotherapy about 3 weeks prior to admission who presented to the ED with increased weakness for the past 2 days and increased left leg swelling 2 weeks duration. Patient was tachycardic and hypotensive in the ED and was given empiric antibiotic with concern for sepsis. However no clinical source of infection was found and a CT angiogram of the chest showed saddle PE. Patient was admitted to ICU with septic shock and initiated levophed in the ED. He was also found to have bilateral lower leg DVT. Patient started on IV heparin and transferred to hospitalist service on 10/4.  Assessment/Plan: Saddle PE with bilateral lower leg extremity DVT Risk factor includes underlying malignancy. On IV heparin for 5 days and then transitioned to oral Xarelto this am. Hemodynamically stable. He will possibly need lifelong  anticoagulation.    metastatic stage IV colon cancer  multiple large hepatic metastasis with ascites. . Underwent therapeutic paracentesis 6 L yellow fluid removed on 10/5 Abdominal distention and leg swellings much improved. Peritoneal fluid cytology pending.  Abdominal ascites with anasarca  secondary to hepatic metastatis and hypoproteinemia Doubled lasix  dose as blood pressure stable. Received IV albumin with paracentesis. Abdominal distention and leg swelling much improved. Needs Lasix upon discharge.  Hypotension  on admission with saddle PE. Off pressors. BP elevated. Increased lasix dose.  PAF Converted to sinus rhythm. Cardiology was consulted by [facility].  Chr diarrhea  improved with lomotil  generalized weakness with failure tothrive Secondary to above issues. Has not been out of bed for several  days. PT evaluation.  Anemia  secondary to malignancy and chemo. Monitor.  DVT Prophylaxis: IV heaprin  DIET: REGULAR  Code Status: full code Family Communication: wife at bedside Disposition Plan: Home possibly tomorrow if has better mobility    Consultants:  PCCM  Oncology    Procedures:  US paracentesis  CT angiogram chest  Doppler lower extremities  Antibiotics: In ED  HPI/Subjective: Patient seen and examined . Denies any symptoms except for weakness.  Objective: Filed Vitals:   10/07/15 1000  BP: 109/75  Pulse: 82  Temp:   Resp:     Intake/Output Summary (Last 24 hours) at 10/07/15 1303 Last data filed at 10/07/15 0906  Gross per 24 hour  Intake 947.85 ml  Output   2250 ml  Net -1302.15 ml   Filed Weights   10/02/15 1558 10/06/15 0411 10/07/15 0500  Weight: 122.7 kg (270 lb 8.1 oz) 112.5 kg (248 lb 0.3 oz) 110 kg (242 lb 8.1 oz)    Exam:   General:  NAD, fatigued  HEENT: pallor+, moist mucosa  Chest: clear b/l   CVS: NS1&S2  GI: Minimal abdominal distention (improved). Nontender, bowel sounds present  Musculoskeletal: 1+ pitting edema B/L (improved after paracentesis)  CNS: alert and oriented.   Data Reviewed: Basic Metabolic Panel:  Recent Labs Lab 10/02/15 1045 10/03/15 0535 10/04/15 1440 10/05/15 0445  NA 139 137 137 140  K 3.7 3.7 3.4* 3.5  CL 110 108 108 108  CO2 19* 22 23 25   GLUCOSE 90 106* 147* 110*  BUN 17 19 17 16   CREATININE 0.92 0.83 0.79 0.79  CALCIUM 7.9* 7.4* 7.7* 7.8*  MG  --   --  1.4*  --    Liver  Function Tests:  Recent Labs Lab 10/02/15 1045 10/03/15 0535  AST 26 22  ALT 9* 9*  ALKPHOS 258* 216*  BILITOT 1.6* 1.2  PROT 5.4* 5.2*  ALBUMIN 1.8* 1.7*   No results for input(s): LIPASE, AMYLASE in the last 168 hours. No results for input(s): AMMONIA in the last 168 hours. CBC:  Recent Labs Lab 10/02/15 1045 10/02/15 1624 10/03/15 1625 10/04/15 0435 10/05/15 0445 10/06/15 0519   WBC 1.4* 2.9* 4.6 3.5* 4.5 4.4  NEUTROABS 0.5* 1.5*  --   --   --   --   HGB 10.3* 9.8* 10.2* 9.5* 10.1* 9.8*  HCT 33.0* 31.6* 32.7* 31.0* 32.8* 31.4*  MCV 89.2 88.5 88.9 88.8 88.2 88.2  PLT 178 158 170 154 176 150   Cardiac Enzymes: No results for input(s): CKTOTAL, CKMB, CKMBINDEX, TROPONINI in the last 168 hours. BNP (last 3 results) No results for input(s): BNP in the last 8760 hours.  ProBNP (last 3 results) No results for input(s): PROBNP in the last 8760 hours.  CBG:  Recent Labs Lab 10/06/15 0829 10/06/15 1247 10/06/15 1652 10/07/15 0725 10/07/15 1219  GLUCAP 99 113* 135* 94 126*    Recent Results (from the past 240 hour(s))  Blood Culture (routine x 2)     Status: None   Collection Time: 10/02/15 10:50 AM  Result Value Ref Range Status   Specimen Description BLOOD CHEST PORT  3 ML IN YELLOW BOTTLE  Final   Special Requests NONE  Final   Culture   Final    NO GROWTH 5 DAYS Performed at [facility]    Report Status 10/07/2015 FINAL  Final  Blood Culture (routine x 2)     Status: None   Collection Time: 10/02/15 11:00 AM  Result Value Ref Range Status   Specimen Description BLOOD LEFT HAND  1 ML IN YELLOW BOTTLE  Final   Special Requests NONE  Final   Culture   Final    NO GROWTH 5 DAYS Performed at [facility]    Report Status 10/07/2015 FINAL  Final  Urine culture     Status: None   Collection Time: 10/02/15  1:45 PM  Result Value Ref Range Status   Specimen Description URINE, CLEAN CATCH  Final   Special Requests Immunocompromised  Final   Culture   Final    NO GROWTH 1 DAY Performed at [facility]    Report Status 10/03/2015 FINAL  Final  MRSA PCR Screening     Status: None   Collection Time: 10/02/15  3:50 PM  Result Value Ref Range Status   MRSA by PCR NEGATIVE NEGATIVE Final    Comment:        The GeneXpert MRSA Assay (FDA approved for NASAL specimens only), is one component of a comprehensive MRSA  colonization surveillance program. It is not intended to diagnose MRSA infection nor to guide or monitor treatment for MRSA infections.      Studies: US Paracentesis  10/05/2015   INDICATION: Metastatic colon carcinoma, recent PE/DVT, ascites. Request is made for diagnostic and therapeutic paracentesis up to 6 liters.  EXAM: ULTRASOUND-GUIDED DIAGNOSTIC AND THERAPEUTIC PARACENTESIS  COMPARISON:  None.  MEDICATIONS: None.  COMPLICATIONS: None immediate  TECHNIQUE: Informed written consent was obtained from the patient after a discussion of the risks, benefits and alternatives to treatment. A timeout was performed prior to the initiation of the procedure.  Initial ultrasound scanning demonstrates a large amount of ascites within the left lower  abdominal quadrant. The left lower abdomen was prepped and draped in the usual sterile fashion. 1% lidocaine was used for local anesthesia. Under direct ultrasound guidance, a 19 gauge, 10-cm, Yueh catheter was introduced. An ultrasound image was saved for documentation purposed. The paracentesis was performed. The catheter was removed and a dressing was applied. The patient tolerated the procedure well without immediate post procedural complication.  FINDINGS: A total of approximately 6 liters of yellow fluid was removed. Samples were sent to the laboratory as requested by the clinical team.  IMPRESSION: Successful ultrasound-guided diagnostic and therapeutic paracentesis yielding 6 liters of peritoneal fluid.  Read by: Rowe Robert, PA-C   Electronically Signed   By: Jerilynn Mages.  Shick M.D.   On: 10/05/2015 16:24    Scheduled Meds: . antiseptic oral rinse  7 mL Mouth Rinse BID  . feeding supplement (ENSURE ENLIVE)  237 mL Oral BID BM  . furosemide  40 mg Intravenous BID  . Rivaroxaban  15 mg Oral BID WC   Continuous Infusions: . sodium chloride 10 mL/hr at 10/07/15 1028       Time spent: 25 minutes    Jaime [name], [place]  [facility] Pager [phone].  If 7PM-7AM, please contact night-coverage at [email or web address], password [facility] 10/07/2015, 1:03 PM  LOS: 5 days

## 2015-10-07 NOTE — Progress Notes (Signed)
IP PROGRESS NOTE  Subjective:  No pain or nausea. Eating breakfast. He has not been out of bed.  Objective: Vital signs in last 24 hours: Blood pressure 152/84, pulse 72, temperature 97.7 F (36.5 C), temperature source Oral, resp. rate 18, height 6\' 3"  (1.905 m), weight 242 lb 8.1 oz (110 kg), SpO2 98 %.  Intake/Output from previous day: 10/06 0701 - 10/07 0700 In: 707.9 [I.V.:707.9] Out: 4700 [Urine:4700]  Physical Exam: Cardiovascular: Regular rate and rhythm Lungs: Clear anteriorly, no respiratory distress  Abdomen: Distended Extremities: Pitting edema throughout the legs bilaterally, left greater than right-improved   Portacath/PICC-without erythema  Lab Results:  Recent Labs  10/05/15 0445 10/06/15 0519  WBC 4.5 4.4  HGB 10.1* 9.8*  HCT 32.8* 31.4*  PLT 176 150    BMET  Recent Labs  10/04/15 1440 10/05/15 0445  NA 137 140  K 3.4* 3.5  CL 108 108  CO2 23 25  GLUCOSE 147* 110*  BUN 17 16  CREATININE 0.79 0.79  CALCIUM 7.7* 7.8*    Medications: I have reviewed the patient's current medications.  Assessment/Plan:  1. 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, status post a colonoscopy 07/21/2015 confirming a sigmoid colon mass  Staging CT scans 07/13/2015 and 07/14/2015 confirmed extensive liver metastases, lung nodules, a right pleural effusion, and porta hepatis lymphadenopathy  Cycle 1 FOLFOX (chemotherapy doses reduced and bolus 5-FU eliminated) 08/02/2015  Cycle 2 FOLFOX (chemotherapy doses reduced and bolus 5-FU eliminated) 08/16/2015  Cycle 3 FOLFOX (Avastin added, bolus 5-FU introduced, and infusional 5-FU dose escalated) 08/30/2015  Cycle 4 FOLFOX plus Avastin 09/13/2015  Cycle 5 held on 09/27/2015 due to mild neutropenia, recent diarrhea.  CT of the chest 10/02/2015, compared to a CT from 07/13/2015-no significant change in lung nodules or liver metastases  2. Obstructive jaundice, likely secondary to porta hepatis  lymphadenopathy, status post placement of a bile duct stent on 07/14/2015  3. Microcytic anemia secondary to the colon mass-improved  4. Diabetes  5. Failure to thrive secondary to metastatic colon cancer  6. Port-A-Cath placement 08/01/2015  7. Nausea following cycle 1 and cycle 2 FOLFOX-emend added with cycle 3  8. Diarrhea following cycle 4 FOLFOX/Avastin.  9. Neutropenia following cycle 4 FOLFOX/Avastin. Neulasta to be added with cycle 5.  10.  Admission 10/02/2015 with bilateral DVTs and pulmonary emboli-maintained on heparin, scheduled to begin Xarelto 10/07/2015   He continues to undergo diuresis. The leg edema is significantly improved. He needs to begin work with physical therapy and ambulate as tolerated. He will need skilled nursing facility placement if he cannot ambulate. The plan is to resume systemic chemotherapy as an outpatient next week.   Recommendations: 1. Begin Xarelto 2. Follow-up peritoneal fluid cytology-pending this morning  3. Increase ambulation as tolerated, physical therapy evaluation 4. I will schedule outpatient follow-up at the Cancer center for next week. Please call oncology over the weekend as needed     LOS: 5 days   [place], [place]  10/07/2015, 8:25 AM

## 2015-10-07 NOTE — Progress Notes (Signed)
OT Cancellation Note  Patient Details Name: Jaime [name] MRN: [id number] DOB: 1956/10/02   Cancelled Treatment:    Reason Eval/Treat Not Completed: Other (comment) pt currently eating and wanting to wait until wife here to have therapy session.will check back as schedule allows.  [place], [place] 10/07/2015, 1:12 PM

## 2015-10-07 NOTE — Progress Notes (Signed)
PT Cancellation Note  Patient Details Name: Jaime [name] MRN: [id number] DOB: 1956-03-21   Cancelled Treatment:    Reason Eval/Treat Not Completed: Pt requested PT check back later this afternoon when wife is present. Will check back as schedule permits.    Weston Anna, MPT Pager: [phone]

## 2015-10-07 NOTE — Progress Notes (Signed)
ANTICOAGULATION CONSULT NOTE - Follow Up Consult  Pharmacy Consult for Xarelto Indication: pulmonary embolus and DVT  No Known Allergies  Patient Measurements: Height: 6\' 3"  (190.5 cm) Weight: 242 lb 8.1 oz (110 kg) IBW/kg (Calculated) : 84.5 Heparin Dosing Weight: 108 kg  Vital Signs: Temp: 97.7 F (36.5 C) (10/07 0526) Temp Source: Oral (10/07 0526) BP: 152/84 mmHg (10/07 0526) Pulse Rate: 72 (10/07 0526)  Labs:  Recent Labs  10/04/15 1440 10/05/15 0445  10/06/15 0519 10/06/15 1418 10/06/15 2103 10/07/15 0300  HGB  --  10.1*  --  9.8*  --   --   --   HCT  --  32.8*  --  31.4*  --   --   --   PLT  --  176  --  150  --   --   --   HEPARINUNFRC  --  0.20*  < > 0.28* 0.22* 0.40 0.39  CREATININE 0.79 0.79  --   --   --   --   --   < > = values in this interval not displayed.  Estimated Creatinine Clearance: 131.5 mL/min (by C-G formula based on Cr of 0.79).  Medications:  Infusions:  . sodium chloride 10 mL/hr at 10/06/15 9767    Assessment: 22 yoM with colorectal cancer currently undergoing chemotherapy (FOLFOX q14d) presents on 10/2 with weakness, hypotension. Lactic acid elevated, code sepsis called, and started on broad spectrum antibiotics. He was also found to have RV dilation on cardiac [place]; CT confirmed saddle PE and Dopplers revealed bilateral DVTs.  Pharmacy was consulted to start Heparin drip, then transition to [place] on 10/7.  Today, 10/07/2015:  Heparin level: 0.39, therapeutic  CBC:  Hgb low/stable at 9.8, Plt remain WNL (34/1)  No IV complications, bleeding or adverse events reported.  Patient education completed on 10/7, discount card provided to patient.  Confirmed that patient's regular pharmacy, [facility], has starter pack in stock and reserved for Jaime [name]  Goal of Therapy:  Heparin level 0.3-0.7 units/ml Monitor platelets by anticoagulation protocol: Yes   Plan:   Transition to Xarelto on 10/07/15 AM.  10/7 at 0800,  stop Heparin   10/7 at 0800 Start Xarelto 15 mg BID with meals x21 days, then change to 20mg  once daily with supper.   Gretta Arab PharmD, BCPS Pager [phone] 10/07/2015 8:33 AM

## 2015-10-07 NOTE — Care Management Note (Signed)
Case Management Note  Patient Details  Name: Jaime [name] MRN: [id number] Date of Birth: 06/14/1956  Subjective/Objective: Following for OT eval, pt reports may need Taller Commode seat as the one he has is not tall enough. Deferred his questions to OT. PT recommends HHPT. Wife will be available to assist pt. Pt prefers [facility] as he had services with them prior to admission. Called Cyril Mourning [phone]) and made her aware that he would be resuming services.                    Action/Plan:CM will follow for final disposition and additional discharge needs when actually discharged.   Expected Discharge Date:  10/07/15               Expected Discharge Plan:  [place]  In-House Referral:     Discharge planning Services  CM Consult  Post Acute Care Choice:  Durable Medical Equipment Choice offered to:  Patient, Spouse  DME Arranged:    DME Agency:  [place] Arranged:  PT (Had HHPT pta) [place] Agency:  [place]  Status of Service:  Completed, signed off  Medicare Important Message Given:    Date Medicare IM Given:    Medicare IM give by:    Date Additional Medicare IM Given:    Additional Medicare Important Message give by:     If discussed at [place] of Stay Meetings, dates discussed:    Additional Comments:  Delrae Sawyers, RN 10/07/2015, 3:20 PM

## 2015-10-07 NOTE — Progress Notes (Signed)
Pt is being transitioned to Xarelto. Will provide with 30 D coupon. Xarelto started this AM after 5Days IV Heparin. PT eval pending as pt is extremely weak and deconditioned. Wife is at home and supportive. Possible Discharge Saturday. CM will continue to follow. There was some question about cancellation of Insurance on Thursday. Will check with pt to see resolution.

## 2015-10-08 DIAGNOSIS — E440 Moderate protein-calorie malnutrition: Secondary | ICD-10-CM

## 2015-10-08 DIAGNOSIS — D638 Anemia in other chronic diseases classified elsewhere: Secondary | ICD-10-CM

## 2015-10-08 LAB — GLUCOSE, CAPILLARY
GLUCOSE-CAPILLARY: 107 mg/dL — AB (ref 65–99)
GLUCOSE-CAPILLARY: 157 mg/dL — AB (ref 65–99)
Glucose-Capillary: 159 mg/dL — ABNORMAL HIGH (ref 65–99)

## 2015-10-08 MED ORDER — FUROSEMIDE 40 MG PO TABS
40.0000 mg | ORAL_TABLET | Freq: Two times a day (BID) | ORAL | Status: DC
  Administered 2015-10-09 – 2015-10-12 (×7): 40 mg via ORAL
  Filled 2015-10-08 (×7): qty 1

## 2015-10-08 NOTE — Evaluation (Addendum)
Occupational Therapy Evaluation Patient Details Name: Jaime [name] MRN: [id number] DOB: 02/13/1956 Today's Date: 10/08/2015    History of Present Illness 59 yo male admitted with acute PE, bil DVT LEs. Hx of stage IV colon cancer, DM, HTN   Clinical Impression   Patient presenting with deconditioning which impacts his ability to perform ADLs and functional mobility. Patient independent to mod I PTA. Patient currently functioning at an overall supervision level for UB ADLs, mod assist level for bed mobility, min/mod assist level for sito/from stands (according to PT), and up to max assist for LB ADLs. Patient will benefit from acute OT to increase overall independence in the areas of ADLs, functional mobility, and overall safety in order to safely discharge home with [facility] and wife.   Pt stated he wants a higher BSC, unsure if able to get a higher BSC. Most BSC's adjust to a 22 inch height. Pt reports that 22 inchs is not high enough for him. Will continue to work with patient and wife on this.     Follow Up Recommendations  Home health OT;Supervision/Assistance - 24 hour    Equipment Recommendations  None recommended by OT    Recommendations for Other Services  None at this time   Precautions / Restrictions Precautions Precautions: Fall Restrictions Weight Bearing Restrictions: No    Mobility Bed Mobility Overal bed mobility: Needs Assistance Bed Mobility: Supine to Sit     Supine to sit: HOB elevated;Mod assist     General bed mobility comments: Mod assist for trunk control. Pt with posterior lean and reaching for wife or therapist to sit EOB.   Transfers General transfer comment: did not occur, pt refused    Balance Overall balance assessment: Needs assistance Sitting-balance support: No upper extremity supported;Feet supported Sitting balance-Leahy Scale: Fair    ADL Overall ADL's : Needs assistance/impaired Eating/Feeding: Set up;Sitting   Grooming: Set  up;Sitting   Upper Body Bathing: Set up;Sitting   Lower Body Bathing: Moderate assistance;Sit to/from stand   Upper Body Dressing : Set up;Sitting   Lower Body Dressing: Maximal assistance;Sit to/from Health and safety inspector Details (indicate cue type and reason): did not occur, pt refused   Toileting - Clothing Manipulation Details (indicate cue type and reason): did not occur, pt refused   Tub/Shower Transfer Details (indicate cue type and reason): did not occur, pt refused   General ADL Comments: Pt refused to stand with this therapist. Pt, however, willing to sit EOB and work on tasks. Pt engaged in bed mobility with mod assist and sat EOB. Encouraged pt to sit EOB during each meal (at least 3x per day). Edcuated pt on BUE exercsies, encouraged wife to bring in her 2.5 lb dumbells. Encouraged pt to do this exercises while sitting on the EOB.     Pertinent Vitals/Pain Pain Assessment: No/denies pain   Extremity/Trunk Assessment Upper Extremity Assessment Upper Extremity Assessment: Generalized weakness   Lower Extremity Assessment Lower Extremity Assessment: Defer to PT evaluation   Cervical / Trunk Assessment Cervical / Trunk Assessment: Kyphotic   Communication Communication Communication: No difficulties   Cognition Arousal/Alertness: Awake/alert Behavior During Therapy: Anxious Overall Cognitive Status: Within Functional Limits for tasks assessed      Exercises  See under exercise TAB in doc flowsheets or navigator           Home Living Family/patient expects to be discharged to:: Private residence Living Arrangements: Spouse/significant other Available Help at Discharge: Family;Available 24 hours/day Type of Home: House  Home Access: Stairs to enter;Ramped entrance     Home Layout: One level     Bathroom Shower/Tub: Walk-in shower;Curtain   Bathroom Toilet: Handicapped height     Home Equipment: Environmental consultant - 2 wheels;Cane - single point;Bedside  commode;Shower seat   Prior Functioning/Environment Level of Independence: Independent with assistive device(s)  Comments: using RW    OT Diagnosis: Generalized weakness   OT Problem List: Decreased strength;Decreased activity tolerance;Impaired balance (sitting and/or standing);Decreased safety awareness;Decreased knowledge of use of DME or AE   OT Treatment/Interventions: Self-care/ADL training;Therapeutic exercise;Energy conservation;DME and/or AE instruction;Therapeutic activities;Patient/family education;Balance training    OT Goals(Current goals can be found in the care plan section) Acute Rehab OT Goals Patient Stated Goal: to get stronger OT Goal Formulation: With patient/family Time For Goal Achievement: 10/22/15 Potential to Achieve Goals: Good ADL Goals Pt Will Transfer to Toilet: bedside commode;ambulating;with supervision Pt/caregiver will Perform Home Exercise Program: Increased strength;Both right and left upper extremity;With theraband;With Supervision;With written HEP provided (theraband vs weighted dumbells) Additional ADL Goal #1: Pt will perform sit to/from stands with supervision using RW prn  OT Frequency: Min 2X/week   Barriers to D/C: None known at this time   End of Session Nurse Communication: Other (comment) (notified RN of therapists recommendation for pt to sit EOB for meals (at least 3x per day). And notified RN that pt left seated EOB and aware to call for assistance once ready to lay back down. )  Activity Tolerance: Patient tolerated treatment well Patient left: in bed;with call bell/phone within reach;with family/visitor present (seated EOB)   Time: 1339-1400 OT Time Calculation (min): 21 min Charges:  OT General Charges $OT Visit: 1 Procedure OT Evaluation $Initial OT Evaluation Tier I: 1 Procedure  Sederick Jacobsen , MS, OTR/L, CLT Pager: [phone]  10/08/2015, 3:15 PM

## 2015-10-08 NOTE — Progress Notes (Addendum)
Patient ID: Jaime [name], male   DOB: [birth year], 59 y.o.   MRN: [id number] [facility] PROGRESS NOTE  Jaime [name] LGX:[id number] DOB: December 27, 1956 DOA: 10/02/2015 PCP: Velna Hatchet, MD  Brief narrative:    59 year old male with past medical history of stage IV colon cancer diagnosed 07/2015, started on chemotherapy with FOLFOX, added Avastin on 08/30/2015 with last chemotherapy about 3 weeks prior to this admission who presented to [facility] ED 10/02/2015 with worsening left leg swelling for past 2 weeks prior to this admission.   LE doppler demonstrated extensive, occlusive, acute DVT throughout the bilateral lower extremities, beginning in the posterior tibial vein and the peroneal vein and coursing through to the popliteal, femoral, and common femoral veins. His CT angio chest demonstrated saddle PE. He ws also hypotensive and tachycardic. Pt treated initially for septic shock and has required levophed in ED. Source of infection not identified.   Anticipated discharge: By 10/10.  Assessment/Plan:    Principal Problem: Saddle PE with bilateral lower leg extremity DVT - Likely due to malignancy - No on xarelto - No reports of bleeding   Active Problems: Metastatic stage IV colon cancer with liver metastases and ascites / Pulmonary metastases  - S/P therapeutic paracentesis on 10/5 with 6 L yellow fluid removed  - Peritoneal fluid cytology did not reveal malignant cells.  Chronic diastolic CHF - 2 D ECHO on this admission showed grade 1 diastolic dysfunction with preserved EF - He is on lasix  40 mg IV BID; will change to PO regimen   Atrial fibrillation - CHADS vasc score low - On ac with xaerlto for PE - Rate controlled  - Converted to sinus rhythm. Cardiology has seen the pt in consultation.   Generalized weakness / Failure to thrive / Moderate protein calorie malnutrition  - Likely from malignancy, pulmonary embolism and DVT - PT has seen the pt in consultation, recommend  HH PT. - Continue nutritional supplementation   Anemia of chronic disease  - secondary to history of malignancy  - Hemoglobin stable  - No reports of bleeding   DVT Prophylaxis  - On AC with xarelto   Code Status: Full.  Family Communication:  plan of care discussed with the patient Disposition Plan: Home when stable.   IV access:  Peripheral IV  Procedures and diagnostic studies:    US Paracentesis 10/05/2015  Successful ultrasound-guided diagnostic and therapeutic paracentesis yielding 6 liters of peritoneal fluid.  Read by: Rowe Robert, PA-C   Electronically Signed   By: Jerilynn Mages.  Shick M.D.   On: 10/05/2015 16:24   Ct Angio Chest Pe W/cm &/or Wo Cm 10/02/2015  1. New central and segmental bilateral pulmonary emboli with equivocal suggestion of right heart strain due to submassive (intermediate risk) PE. 2. Large amount of abdominal ascites, significantly increased from prior study. 3. Progression of pulmonary and mediastinal metastatic disease.  Dg Chest Port 1 View 10/02/2015  Hypoventilation with bibasilar atelectasis. Multiple lung nodules noted from prior chest CT.     Bilateral DVT 10/02/2015 There is extensive, occlusive, acute DVT throughout the bilateral lower extremities, beginning in the posterior tibial vein and the peroneal vein and coursing through to the popliteal, femoral, and common femoral veins.  Medical Consultants:  PCCM Oncology  Other Consultants:  None   Leisa Lenz, MD  [facility] Pager [phone]  Time spent in minutes: 25 minutes  If 7PM-7AM, please contact night-coverage [email or web address] Password [email or web address] 10/08/2015, 5:21 PM   LOS: 6 days    HPI/Subjective: No  acute overnight events. Patient reports feeling weak.   Objective: Filed Vitals:   10/07/15 1347 10/07/15 2054 10/08/15 0422 10/08/15 1337  BP: 140/90 129/76 133/84 136/87  Pulse: 78 86 75 82  Temp: 97.9 F (36.6 C) 97.8 F (36.6 C) 98 F (36.7 C) 98.4 F (36.9 C)  TempSrc: Oral  Oral Oral Oral  Resp: 18 18 16 16   Height:      Weight:   109.7 kg (241 lb 13.5 oz)   SpO2: 97% 100% 97% 99%    Intake/Output Summary (Last 24 hours) at 10/08/15 1721 Last data filed at 10/08/15 1500  Gross per 24 hour  Intake   2040 ml  Output   1700 ml  Net    340 ml    Exam:   General:  Pt is alert, follows commands appropriately, not in acute distress  Cardiovascular: Regular rate and rhythm, S1/S2, no murmurs  Respiratory: Clear to auscultation bilaterally, no wheezing, no crackles, no rhonchi  Abdomen: Soft, non tender, non distended, bowel sounds present  Extremities: LE pitting edema (+1), pulses DP and PT palpable bilaterally  Neuro: Grossly nonfocal  Data Reviewed: Basic Metabolic Panel:  Recent Labs Lab 10/02/15 1045 10/03/15 0535 10/04/15 1440 10/05/15 0445  NA 139 137 137 140  K 3.7 3.7 3.4* 3.5  CL 110 108 108 108  CO2 19* 22 23 25   GLUCOSE 90 106* 147* 110*  BUN 17 19 17 16   CREATININE 0.92 0.83 0.79 0.79  CALCIUM 7.9* 7.4* 7.7* 7.8*  MG  --   --  1.4*  --    Liver Function Tests:  Recent Labs Lab 10/02/15 1045 10/03/15 0535  AST 26 22  ALT 9* 9*  ALKPHOS 258* 216*  BILITOT 1.6* 1.2  PROT 5.4* 5.2*  ALBUMIN 1.8* 1.7*   No results for input(s): LIPASE, AMYLASE in the last 168 hours. No results for input(s): AMMONIA in the last 168 hours. CBC:  Recent Labs Lab 10/02/15 1045 10/02/15 1624 10/03/15 1625 10/04/15 0435 10/05/15 0445 10/06/15 0519  WBC 1.4* 2.9* 4.6 3.5* 4.5 4.4  NEUTROABS 0.5* 1.5*  --   --   --   --   HGB 10.3* 9.8* 10.2* 9.5* 10.1* 9.8*  HCT 33.0* 31.6* 32.7* 31.0* 32.8* 31.4*  MCV 89.2 88.5 88.9 88.8 88.2 88.2  PLT 178 158 170 154 176 150   Cardiac Enzymes: No results for input(s): CKTOTAL, CKMB, CKMBINDEX, TROPONINI in the last 168 hours. BNP: Invalid input(s): POCBNP CBG:  Recent Labs Lab 10/07/15 1219 10/07/15 1713 10/08/15 0746 10/08/15 1204 10/08/15 1640  GLUCAP 126* 162* 107* 159* 157*     Blood Culture (routine x 2)     Status: None   Collection Time: 10/02/15 10:50 AM  Result Value Ref Range Status   Specimen Description BLOOD CHEST PORT  3 ML IN YELLOW BOTTLE  Final   Special Requests NONE  Final   Culture   Final    NO GROWTH 5 DAYS    Report Status 10/07/2015 FINAL  Final  Blood Culture (routine x 2)     Status: None   Collection Time: 10/02/15 11:00 AM  Result Value Ref Range Status   Specimen Description BLOOD LEFT HAND  1 ML IN YELLOW BOTTLE  Final   Special Requests NONE  Final   Culture   Final    NO GROWTH 5 DAYS    Report Status 10/07/2015 FINAL  Final  Urine culture     Status: None  Collection Time: 10/02/15  1:45 PM  Result Value Ref Range Status   Specimen Description URINE, CLEAN CATCH  Final   Special Requests Immunocompromised  Final   Culture   Final    NO GROWTH 1 DAY    Report Status 10/03/2015 FINAL  Final  MRSA PCR Screening     Status: None   Collection Time: 10/02/15  3:50 PM  Result Value Ref Range Status   MRSA by PCR NEGATIVE NEGATIVE Final     Scheduled Meds: . antiseptic oral rinse  7 mL Mouth Rinse BID  . feeding supplement (ENSURE ENLIVE)  237 mL Oral BID BM  . furosemide  40 mg Intravenous BID  . Rivaroxaban  15 mg Oral BID WC   Continuous Infusions: . sodium chloride 10 mL/hr at 10/07/15 1028

## 2015-10-09 ENCOUNTER — Encounter (HOSPITAL_COMMUNITY): Payer: Self-pay | Admitting: Internal Medicine

## 2015-10-09 DIAGNOSIS — I5032 Chronic diastolic (congestive) heart failure: Secondary | ICD-10-CM

## 2015-10-09 DIAGNOSIS — R188 Other ascites: Secondary | ICD-10-CM

## 2015-10-09 DIAGNOSIS — D638 Anemia in other chronic diseases classified elsewhere: Secondary | ICD-10-CM | POA: Diagnosis present

## 2015-10-09 HISTORY — DX: Chronic diastolic (congestive) heart failure: I50.32

## 2015-10-09 LAB — GLUCOSE, CAPILLARY
GLUCOSE-CAPILLARY: 97 mg/dL (ref 65–99)
GLUCOSE-CAPILLARY: 134 mg/dL — AB (ref 65–99)
GLUCOSE-CAPILLARY: 155 mg/dL — AB (ref 65–99)

## 2015-10-09 NOTE — Progress Notes (Signed)
Paged materials for prevalon boots and pressure redistribution pad for chair x2. No response so far. Will notify night shift nurse to pass along to day shift nurse on 10/10.  Rosine Beat, RN

## 2015-10-09 NOTE — Clinical Social Work Note (Signed)
CSW met with pt and his wife at bedside to assess for discharge services.  Pt's wife stated that she was taking pt home and could take time off from work to accommodate the 24 hour supervision of PT recommendation.  CSW faxed pt information to SNF's in case family changes mind in the morning we will have who can accept with the insurance if any.  Dede Query, LCSW [place] Worker - Weekend Coverage cell #: [phone]

## 2015-10-09 NOTE — Progress Notes (Signed)
Continues to be a total care, weak and deconditioned.   States at home he usually could walk around by himself and at times used a walker.

## 2015-10-09 NOTE — Progress Notes (Signed)
Physical Therapy Treatment Patient Details Name: Jaime [name] MRN: [id number] DOB: April 27, 1956 Today's Date: 10/09/2015    History of Present Illness 59 yo male admitted with acute PE, bil DVT LEs. Hx of stage IV colon cancer, DM, HTN    PT Comments    MAX encouragement for pt to participate/progress activity! Pt is not very motivated to improve strength or activity tolerance. Mod assist +2 to rise from low surfaces. Walked ~45 feet with RW and Min assist +2 for safety. Continue to recommend wheelchair at this time. May need to consider ambulance/PTAR transport home if pt continues to have difficulty standing.   Follow Up Recommendations  Home health PT;Supervision/Assistance - 24 hour     Equipment Recommendations  Wheelchair (measurements PT)    Recommendations for Other Services OT consult     Precautions / Restrictions Precautions Precautions: Fall Restrictions Weight Bearing Restrictions: No    Mobility  Bed Mobility Overal bed mobility: Needs Assistance Bed Mobility: Sit to Supine     Sit to Supine: Mod assist Sit to supine: HOB elevated   General bed mobility comments: assist for bil LEs onto bed. Increased time. VCs safety, technique, hand placement  Transfers Overall transfer level: Needs assistance Equipment used: Rolling walker (2 wheeled) Transfers: Sit to/from [facility] Sit to Stand: Mod assist;+2 physical assistance;+2 safety/equipment Stand pivot transfers: Min guard       General transfer comment: Mod assist  +2 from low surfaces - recliner. Multimodal cues for safety, technique, hand/foot placement. Increased time. stand pivot from [facility] to bed with rW  Ambulation/Gait Ambulation/Gait assistance: Min assist;+2 safety/equipment Ambulation Distance (Feet): 45 Feet Assistive device: Rolling walker (2 wheeled) Gait Pattern/deviations: Step-through pattern;Decreased stride length;Trunk flexed     General Gait Details: MAX  encouragement for pt to progress distance! +2 for safety. slow gait speed.    Stairs            Wheelchair Mobility    Modified Rankin (Stroke Patients Only)       Balance           Standing balance support: Bilateral upper extremity supported;During functional activity Standing balance-Leahy Scale: Poor                      Cognition Arousal/Alertness: Awake/alert Behavior During Therapy: Anxious Overall Cognitive Status: Within Functional Limits for tasks assessed                      Exercises      General Comments        Pertinent Vitals/Pain Pain Assessment: No/denies pain    Home Living                      Prior Function            PT Goals (current goals can now be found in the care plan section) Progress towards PT goals: Progressing toward goals (slowly)    Frequency  Min 3X/week    PT Plan Current plan remains appropriate    Co-evaluation             End of Session Equipment Utilized During Treatment: Gait belt Activity Tolerance: Patient tolerated treatment well Patient left: in bed;with call bell/phone within reach;with family/visitor present     Time: 1410-1443 PT Time Calculation (min) (ACUTE ONLY): 33 min  Charges:  $Gait Training: 8-22 mins $Therapeutic Activity: 8-22 mins  G Codes:      Weston Anna, MPT Pager: [phone]

## 2015-10-09 NOTE — Progress Notes (Addendum)
Patient ID: Jaime [name], male   DOB: 24-Mar-1956, 59 y.o.   MRN: [id number] [facility] PROGRESS NOTE  Jaime [name] OTL:[id number] DOB: 18-Jan-1956 DOA: 10/02/2015 PCP: Velna Hatchet, MD  Brief narrative:    59 year old male with past medical history of stage IV colon cancer diagnosed 07/2015, started on chemotherapy with FOLFOX, added Avastin on 08/30/2015 with last chemotherapy about 3 weeks prior to this admission who presented to [facility] ED 10/02/2015 with worsening left leg swelling for past 2 weeks prior to this admission.   LE doppler demonstrated extensive, occlusive, acute DVT throughout the bilateral lower extremities, beginning in the posterior tibial vein and the peroneal vein and coursing through to the popliteal, femoral, and common femoral veins. His CT angio chest demonstrated saddle PE. He ws also hypotensive and tachycardic. Pt treated initially for septic shock and has required levophed in ED. Source of infection not identified.   Anticipated discharge: 10/10.  Assessment/Plan:    Principal Problem: Saddle PE with bilateral lower leg extremity DVT - Malignancy likely contributory - Continue AC with xarelto    Active Problems: Metastatic stage IV colon cancer with liver metastases and ascites / Pulmonary metastases  - Pt had therapeutic paracentesis on 10/5 with 6 L yellow fluid removed  - Please note that the peritoneal fluid cytology did not show malignant cells.  Chronic diastolic CHF - 2 D ECHO done on this admission showed grade 1 diastolic dysfunction with preserved EF - Continue lasix BID - Weight trend in past 7 2hours: 110 kg --> 109.7 kg --> 105.5 kg  Atrial fibrillation - CHADS vasc score 2 accounting for age ad CHF - Converted to sinus rhythm.  - On AC for PE - No need for rate control meds  Generalized weakness / Failure to thrive / Moderate protein calorie malnutrition  - In the context of malignancy, pulmonary embolism and DVT - PT recommends  HH PT. - We will continue nutritional supplementation   Anemia of chronic disease  - Secondary to malignancy  - Hemoglobin stable   Pressure ulcer, stage 2 - WOC consulted    DVT Prophylaxis  - Continue AC with xarelto    Code Status: Full.  Family Communication:  plan of care discussed with the patient Disposition Plan: Home 10/10   IV access:  Peripheral IV  Procedures and diagnostic studies:    US Paracentesis 10/05/2015  Successful ultrasound-guided diagnostic and therapeutic paracentesis yielding 6 liters of peritoneal fluid.  Read by: Rowe Robert, PA-C   Electronically Signed   By: Jerilynn Mages.  Shick M.D.   On: 10/05/2015 16:24   Ct Angio Chest Pe W/cm &/or Wo Cm 10/02/2015  1. New central and segmental bilateral pulmonary emboli with equivocal suggestion of right heart strain due to submassive (intermediate risk) PE. 2. Large amount of abdominal ascites, significantly increased from prior study. 3. Progression of pulmonary and mediastinal metastatic disease.  Dg Chest Port 1 View 10/02/2015  Hypoventilation with bibasilar atelectasis. Multiple lung nodules noted from prior chest CT.     Bilateral DVT 10/02/2015 There is extensive, occlusive, acute DVT throughout the bilateral lower extremities, beginning in the posterior tibial vein and the peroneal vein and coursing through to the popliteal, femoral, and common femoral veins.  Medical Consultants:  PCCM Oncology  Other Consultants:  None   Leisa Lenz, MD  [facility] Pager [phone]  Time spent in minutes: 15 minutes  If 7PM-7AM, please contact night-coverage [email or web address] Password [email or web address] 10/09/2015, 1:17 PM   LOS: 7 days  HPI/Subjective: No acute overnight events. Patient reports feeling only little better since yesterday.   Objective: Filed Vitals:   10/08/15 0422 10/08/15 1337 10/08/15 2046 10/09/15 0537  BP: 133/84 136/87 129/81 138/83  Pulse: 75 82 82 79  Temp: 98 F (36.7 C) 98.4 F (36.9 C)  97.9 F (36.6 C) 98.2 F (36.8 C)  TempSrc: Oral Oral Oral Axillary  Resp: 16 16 18 18   Height:      Weight: 109.7 kg (241 lb 13.5 oz)   105.5 kg (232 lb 9.4 oz)  SpO2: 97% 99% 98% 95%    Intake/Output Summary (Last 24 hours) at 10/09/15 1317 Last data filed at 10/09/15 1230  Gross per 24 hour  Intake 1663.83 ml  Output   2425 ml  Net -761.17 ml    Exam:   General:  Pt is alert, not in acute distress  Cardiovascular: Rate controlled, (+) S1, S2  Respiratory: No wheezing, no crackles, no rhonchi  Abdomen: (+) BS, non tender  Extremities: palpable pulses, LE pitting edema (+1)  Neuro: No focal deficits   Data Reviewed: Basic Metabolic Panel:  Recent Labs Lab 10/03/15 0535 10/04/15 1440 10/05/15 0445  NA 137 137 140  K 3.7 3.4* 3.5  CL 108 108 108  CO2 22 23 25   GLUCOSE 106* 147* 110*  BUN 19 17 16   CREATININE 0.83 0.79 0.79  CALCIUM 7.4* 7.7* 7.8*  MG  --  1.4*  --    Liver Function Tests:  Recent Labs Lab 10/03/15 0535  AST 22  ALT 9*  ALKPHOS 216*  BILITOT 1.2  PROT 5.2*  ALBUMIN 1.7*   No results for input(s): LIPASE, AMYLASE in the last 168 hours. No results for input(s): AMMONIA in the last 168 hours. CBC:  Recent Labs Lab 10/02/15 1624 10/03/15 1625 10/04/15 0435 10/05/15 0445 10/06/15 0519  WBC 2.9* 4.6 3.5* 4.5 4.4  NEUTROABS 1.5*  --   --   --   --   HGB 9.8* 10.2* 9.5* 10.1* 9.8*  HCT 31.6* 32.7* 31.0* 32.8* 31.4*  MCV 88.5 88.9 88.8 88.2 88.2  PLT 158 170 154 176 150   Cardiac Enzymes: No results for input(s): CKTOTAL, CKMB, CKMBINDEX, TROPONINI in the last 168 hours. BNP: Invalid input(s): POCBNP CBG:  Recent Labs Lab 10/08/15 0746 10/08/15 1204 10/08/15 1640 10/09/15 0744 10/09/15 1200  GLUCAP 107* 159* 157* 97 134*    Blood Culture (routine x 2)     Status: None   Collection Time: 10/02/15 10:50 AM  Result Value Ref Range Status   Specimen Description BLOOD CHEST PORT  3 ML IN YELLOW BOTTLE  Final    Special Requests NONE  Final   Culture   Final    NO GROWTH 5 DAYS    Report Status 10/07/2015 FINAL  Final  Blood Culture (routine x 2)     Status: None   Collection Time: 10/02/15 11:00 AM  Result Value Ref Range Status   Specimen Description BLOOD LEFT HAND  1 ML IN YELLOW BOTTLE  Final   Special Requests NONE  Final   Culture   Final    NO GROWTH 5 DAYS    Report Status 10/07/2015 FINAL  Final  Urine culture     Status: None   Collection Time: 10/02/15  1:45 PM  Result Value Ref Range Status   Specimen Description URINE, CLEAN CATCH  Final   Special Requests Immunocompromised  Final   Culture   Final    NO  GROWTH 1 DAY    Report Status 10/03/2015 FINAL  Final  MRSA PCR Screening     Status: None   Collection Time: 10/02/15  3:50 PM  Result Value Ref Range Status   MRSA by PCR NEGATIVE NEGATIVE Final     Scheduled Meds: . antiseptic oral rinse  7 mL Mouth Rinse BID  . feeding supplement (ENSURE ENLIVE)  237 mL Oral BID BM  . furosemide  40 mg Oral BID  . Rivaroxaban  15 mg Oral BID WC   Continuous Infusions: . sodium chloride 10 mL/hr at 10/07/15 1028

## 2015-10-09 NOTE — Consult Note (Signed)
WOC wound consult note Reason for Consult: Stage 2 Pressure injury at coccyx; wife had been treating at home with hydrocolloid dressings. Wound type:Pressure plus moisture from incontinence (IAD) Pressure Ulcer POA: Yes Measurement: 3.5cm x 6cm area with two distinct area of partial thickness tissue loss the largest of which measures 2cm x 3cm x 0.2cm Wound bed: Pink, moist wound bed with scant serous exudate and rolled epidermis at the periphery Drainage (amount, consistency, odor) As described above Periwound:Intact with blanching erythema.  Bilateral hips with discoloration (brown) but blanchable and no break in the integument. Dressing procedure/placement/frequency:Bilateralk hips to continue with soft silicone foam dressings.  I will remove dressings to the coccyx and sacrum as periodic smalls stools are soiling and causing the dressing to be removed frequently. I will implement a therapeutic mattress with low air loss feature for microclimate management and to reduce friction and shear forces, we will cleanse and cover the affected area with a clear moisture barrier cream. Bilateral pressure redistribution heel boots are provided while he is in bed due to the edema and excess weight placed on the heels as well as a pressure redistribution chair pad for OOB use. Patient ad wife are in agreement with POC. [facility] nursing team will not follow, but will remain available to this patient, the nursing and medical team.  Please re-consult if needed. Thanks, Maudie Flakes, MSN, RN, [place], [place], [place] [phone])

## 2015-10-10 DIAGNOSIS — L8990 Pressure ulcer of unspecified site, unspecified stage: Secondary | ICD-10-CM

## 2015-10-10 DIAGNOSIS — Z7901 Long term (current) use of anticoagulants: Secondary | ICD-10-CM

## 2015-10-10 LAB — BASIC METABOLIC PANEL
ANION GAP: 7 (ref 5–15)
BUN: 14 mg/dL (ref 6–20)
CALCIUM: 7.5 mg/dL — AB (ref 8.9–10.3)
CO2: 28 mmol/L (ref 22–32)
CREATININE: 0.7 mg/dL (ref 0.61–1.24)
Chloride: 102 mmol/L (ref 101–111)
GFR calc Af Amer: >60 mL/min (ref >60)
GLUCOSE: 111 mg/dL — AB (ref 65–99)
Potassium: 2.4 mmol/L — CL (ref 3.5–5.1)
Sodium: 137 mmol/L (ref 135–145)

## 2015-10-10 LAB — GLUCOSE, CAPILLARY
GLUCOSE-CAPILLARY: 107 mg/dL — AB (ref 65–99)
GLUCOSE-CAPILLARY: 196 mg/dL — AB (ref 65–99)
Glucose-Capillary: 147 mg/dL — ABNORMAL HIGH (ref 65–99)

## 2015-10-10 LAB — CBC
HCT: 32.4 % — ABNORMAL LOW (ref 39.0–52.0)
Hemoglobin: 10.2 g/dL — ABNORMAL LOW (ref 13.0–17.0)
MCH: 27.3 pg (ref 26.0–34.0)
MCHC: 31.5 g/dL (ref 30.0–36.0)
MCV: 86.9 fL (ref 78.0–100.0)
PLATELETS: 190 10*3/uL (ref 150–400)
RBC: 3.73 MIL/uL — ABNORMAL LOW (ref 4.22–5.81)
RDW: 17.6 % — AB (ref 11.5–15.5)
WBC: 8.4 10*3/uL (ref 4.0–10.5)

## 2015-10-10 MED ORDER — MAGNESIUM SULFATE 2 GM/50ML IV SOLN
2.0000 g | Freq: Four times a day (QID) | INTRAVENOUS | Status: AC
  Administered 2015-10-10 (×2): 2 g via INTRAVENOUS
  Filled 2015-10-10 (×2): qty 50

## 2015-10-10 MED ORDER — POTASSIUM CHLORIDE CRYS ER 20 MEQ PO TBCR
60.0000 meq | EXTENDED_RELEASE_TABLET | Freq: Once | ORAL | Status: AC
  Administered 2015-10-10: 60 meq via ORAL
  Filled 2015-10-10: qty 3

## 2015-10-10 MED ORDER — POTASSIUM CHLORIDE CRYS ER 20 MEQ PO TBCR
40.0000 meq | EXTENDED_RELEASE_TABLET | Freq: Four times a day (QID) | ORAL | Status: AC
  Administered 2015-10-10 (×2): 40 meq via ORAL
  Filled 2015-10-10 (×2): qty 2

## 2015-10-10 NOTE — Progress Notes (Signed)
Patient ID: Jaime [name], male   DOB: 22-Jan-1956, 59 y.o.   MRN: [id number] [facility] PROGRESS NOTE  Jakhai Fant HQI:[id number] DOB: [birth year] DOA: 10/02/2015 PCP: Velna Hatchet, MD   HPI/Subjective: No complaints since yesterday, still feels weak. Potassium is 2.4, we'll replace with oral supplements, check potassium in a.m. Patient was about to be discharged. Because of low potassium, will replete today and check an a.m.  Brief narrative:    59 year old male with past medical history of stage IV colon cancer diagnosed 07/2015, started on chemotherapy with FOLFOX, added Avastin on 08/30/2015 with last chemotherapy about 3 weeks prior to this admission who presented to [facility] ED 10/02/2015 with worsening left leg swelling for past 2 weeks prior to this admission.   LE doppler demonstrated extensive, occlusive, acute DVT throughout the bilateral lower extremities, beginning in the posterior tibial vein and the peroneal vein and coursing through to the popliteal, femoral, and common femoral veins. His CT angio chest demonstrated saddle PE. He ws also hypotensive and tachycardic. Pt treated initially for septic shock and has required levophed in ED. Source of infection not identified.   Anticipated discharge: 10/11.  Assessment/Plan:    Principal Problem: Saddle PE with bilateral lower leg extremity DVT - Malignancy likely contributory - Continue AC with xarelto   Active Problems: Metastatic stage IV colon cancer with liver metastases and ascites / Pulmonary metastases  - Pt had therapeutic paracentesis on 10/5 with 6 L yellow fluid removed  - Please note that the peritoneal fluid cytology did not show malignant cells.  Chronic diastolic CHF - 2 D ECHO done on this admission showed grade 1 diastolic dysfunction with preserved EF - Continue lasix BID - Weight trend in past 7 2hours: 110 kg --> 109.7 kg --> 105.5 kg  Atrial fibrillation - CHADS vasc score 2 accounting for age  ad CHF - Converted to sinus rhythm.  - On AC for PE - No need for rate control meds  Generalized weakness / Failure to thrive / Moderate protein calorie malnutrition  - In the context of malignancy, pulmonary embolism and DVT - PT recommends HH PT. - We will continue nutritional supplementation   Anemia of chronic disease  - Secondary to malignancy  - Hemoglobin stable   Pressure ulcer, stage 2 - WOC consulted   Hypokalemia -Replete with oral supplements, check potassium in a.m. likely secondary to diuresis.  Hypomagnesemia -Magnesium was 1.4 about a week ago, replete with IV supplements check magnesium in a.m.   DVT Prophylaxis  - Continue AC with xarelto    Code Status: Full.  Family Communication:  plan of care discussed with the patient Disposition Plan: Home 10/10   IV access:  Peripheral IV  Procedures and diagnostic studies:    US Paracentesis 10/05/2015  Successful ultrasound-guided diagnostic and therapeutic paracentesis yielding 6 liters of peritoneal fluid.  Read by: Rowe Robert, PA-C   Electronically Signed   By: Jerilynn Mages.  Shick M.D.   On: 10/05/2015 16:24   Ct Angio Chest Pe W/cm &/or Wo Cm 10/02/2015  1. New central and segmental bilateral pulmonary emboli with equivocal suggestion of right heart strain due to submassive (intermediate risk) PE. 2. Large amount of abdominal ascites, significantly increased from prior study. 3. Progression of pulmonary and mediastinal metastatic disease.  Dg Chest Port 1 View 10/02/2015  Hypoventilation with bibasilar atelectasis. Multiple lung nodules noted from prior chest CT.     Bilateral DVT 10/02/2015 There is extensive, occlusive, acute DVT throughout the bilateral lower  extremities, beginning in the posterior tibial vein and the peroneal vein and coursing through to the popliteal, femoral, and common femoral veins.  Medical Consultants:  [facility] Oncology  Other Consultants:  None   Birdie Hopes, MD  [facility] Pager [phone]  Time spent in minutes: 15 minutes  If 7PM-7AM, please contact night-coverage [email or web address] Password [email or web address] 10/10/2015, 4:08 PM   LOS: 8 days      Objective: Filed Vitals:   10/09/15 1340 10/09/15 2014 10/10/15 0623 10/10/15 1403  BP: 131/77 133/80 132/88 125/83  Pulse: 86 83 84 92  Temp: 97.9 F (36.6 C) 98.1 F (36.7 C) 98.5 F (36.9 C) 99.2 F (37.3 C)  TempSrc: Oral Oral Oral Oral  Resp: 16 16 16 16   Height:      Weight:   105.4 kg (232 lb 5.8 oz)   SpO2: 100% 99% 96% 99%    Intake/Output Summary (Last 24 hours) at 10/10/15 1608 Last data filed at 10/10/15 1300  Gross per 24 hour  Intake 1557.84 ml  Output    950 ml  Net 607.84 ml    Exam:   General:  Pt is alert, not in acute distress  Cardiovascular: Rate controlled, (+) S1, S2  Respiratory: No wheezing, no crackles, no rhonchi  Abdomen: (+) BS, non tender  Extremities: palpable pulses, LE pitting edema (+1)  Neuro: No focal deficits   Data Reviewed: Basic Metabolic Panel:  Recent Labs Lab 10/04/15 1440 10/05/15 0445 10/10/15 0648  NA 137 140 137  K 3.4* 3.5 2.4*  CL 108 108 102  CO2 23 25 28   GLUCOSE 147* 110* 111*  BUN 17 16 14   CREATININE 0.79 0.79 0.70  CALCIUM 7.7* 7.8* 7.5*  MG 1.4*  --   --    Liver Function Tests: No results for input(s): AST, ALT, ALKPHOS, BILITOT, PROT, ALBUMIN in the last 168 hours. No results for input(s): LIPASE, AMYLASE in the last 168 hours. No results for input(s): AMMONIA in the last 168 hours. CBC:  Recent Labs Lab 10/03/15 1625 10/04/15 0435 10/05/15 0445 10/06/15 0519 10/10/15 0648  WBC 4.6 3.5* 4.5 4.4 8.4  HGB 10.2* 9.5* 10.1* 9.8* 10.2*  HCT 32.7* 31.0* 32.8* 31.4* 32.4*  MCV 88.9 88.8 88.2 88.2 86.9  PLT 170 154 176 150 190   Cardiac Enzymes: No results for input(s): CKTOTAL, CKMB, CKMBINDEX, TROPONINI in the last 168 hours. BNP: Invalid input(s): POCBNP CBG:  Recent Labs Lab 10/09/15 0744  10/09/15 1200 10/09/15 1727 10/10/15 0723 10/10/15 1134  GLUCAP 97 134* 155* 107* 196*    Blood Culture (routine x 2)     Status: None   Collection Time: 10/02/15 10:50 AM  Result Value Ref Range Status   Specimen Description BLOOD CHEST PORT  3 ML IN YELLOW BOTTLE  Final   Special Requests NONE  Final   Culture   Final    NO GROWTH 5 DAYS    Report Status 10/07/2015 FINAL  Final  Blood Culture (routine x 2)     Status: None   Collection Time: 10/02/15 11:00 AM  Result Value Ref Range Status   Specimen Description BLOOD LEFT HAND  1 ML IN YELLOW BOTTLE  Final   Special Requests NONE  Final   Culture   Final    NO GROWTH 5 DAYS    Report Status 10/07/2015 FINAL  Final  Urine culture     Status: None   Collection Time: 10/02/15  1:45 PM  Result Value Ref Range  Status   Specimen Description URINE, CLEAN CATCH  Final   Special Requests Immunocompromised  Final   Culture   Final    NO GROWTH 1 DAY    Report Status 10/03/2015 FINAL  Final  MRSA PCR Screening     Status: None   Collection Time: 10/02/15  3:50 PM  Result Value Ref Range Status   MRSA by PCR NEGATIVE NEGATIVE Final     Scheduled Meds: . antiseptic oral rinse  7 mL Mouth Rinse BID  . feeding supplement (ENSURE ENLIVE)  237 mL Oral BID BM  . furosemide  40 mg Oral BID  . Rivaroxaban  15 mg Oral BID WC   Continuous Infusions: . sodium chloride 10 mL/hr at 10/07/15 1028

## 2015-10-10 NOTE — Clinical Social Work Note (Signed)
Clinical Social Work Assessment  Patient Details  Name: Jaime [name] MRN: [id number] Date of Birth: June 22, 1956  Date of referral:  10/09/15               Reason for consult:  Facility Placement                Permission sought to share information with:  Family Supports Permission granted to share information::  Yes, Verbal Permission Granted  Name::     Wife, Javaughn Opdahl  Agency::     Relationship::     Contact Information:     Housing/Transportation Living arrangements for the past 2 months:  Single Family Home Source of Information:  Spouse, Patient Patient Interpreter Needed:  None Criminal Activity/Legal Involvement Pertinent to Current Situation/Hospitalization:  No - Comment as needed Significant Relationships:  Spouse Lives with:  Spouse Do you feel safe going back to the place where you live?  No (wife expresses concern that she cannot care for patient at home due to lifting/assistance w ambulation/transfers needed) Need for family participation in patient care:  Yes (Comment) (Wife asked to call SNF to discuss bed offer)  Care giving concerns:  Wife concerned she may not be able to manage patient care needs at home, concerned about lifting/transfers.  Wants short term rehab placement, pt agreeable but wants to return home.     Social Worker assessment / plan:    Employment status:  Therapist, music:  Managed Care PT Recommendations:  [place] / Referral to community resources:  [place]  Patient/Family's Response to care:  Patient stoic, wants to return to former function, concerned about falls/weakness.  Willing to go to SNF.  Patient/Family's Understanding of and Emotional Response to Diagnosis, Current Treatment, and Prognosis:  New diagnosis of cancer, previously active/working.  Wants to work on rehab in order to regain strength, would prefer to return home, but understands wife may not be able to  handle increased care needs.    Emotional Assessment Appearance:  Appears stated age Attitude/Demeanor/Rapport:  Other (calm, cooperative) Affect (typically observed):  Accepting, Adaptable, Pleasant Orientation:  Oriented to Self, Oriented to Place, Oriented to  Time, Oriented to Situation Alcohol / Substance use:  Never Used Psych involvement (Current and /or in the community):  No (Comment)  Discharge Needs  Concerns to be addressed:  Discharge Planning Concerns Readmission within the last 30 days:  No Current discharge risk:  Dependent with Mobility Barriers to Discharge:  Lewis, Rudd, LCSW 10/10/2015, 1:09 PM

## 2015-10-10 NOTE — Progress Notes (Signed)
Occupational Therapy Treatment Patient Details Name: Jaime [name] MRN: [id number] DOB: 11-Apr-1956 Today's Date: 10/10/2015    History of present illness 59 yo male admitted with acute PE, bil DVT LEs. Hx of stage IV colon cancer, DM, HTN   OT comments  Will continue to research extra tall BSC  Follow Up Recommendations  Home health OT;Supervision/Assistance - 24 hour    Equipment Recommendations  3 in 1 bedside comode (higher one- currenty researching)    Recommendations for Other Services      Precautions / Restrictions Precautions Precautions: Fall       Mobility Bed Mobility Overal bed mobility: Needs Assistance                Transfers              did not perform            ADL Overall ADL's : Needs assistance/impaired Eating/Feeding: Set up;Bed level Eating/Feeding Details (indicate cue type and reason): HOB raised                                   General ADL Comments: Pt refused to stand.  Pt agreed to BUE exercise.  Pt also very interested in a higher BSC. OT called several home medical companies to inquire.  Only option thus far is a bariatric that is very expensive but is one inch higher                Cognition   Behavior During Therapy: WFL for tasks assessed/performed Overall Cognitive Status: Within Functional Limits for tasks assessed                         Exercises General Exercises - Upper Extremity Shoulder Flexion: AROM;Strengthening;Both;10 reps Shoulder Extension: AROM;Strengthening;Both;10 reps Elbow Flexion: AROM;Strengthening;10 reps Elbow Extension: AROM;Strengthening;10 reps   Shoulder Instructions       General Comments      Pertinent Vitals/ Pain       Pain Assessment: 0-10 Pain Score: 0-No pain         Frequency Min 2X/week     Progress Toward Goals  OT Goals(current goals can now be found in the care plan section)  Progress towards OT goals: Progressing toward  goals  Acute Rehab OT Goals Patient Stated Goal: to get stronger OT Goal Formulation: With patient/family  Plan         End of Session     Activity Tolerance Patient tolerated treatment well   Patient Left with call bell/phone within reach;with family/visitor present;in chair (seated EOB)   Nurse Communication Other (comment) (notified RN of therapists recommendation for pt to sit EOB for meals (at least 3x per day). And notified RN that pt left seated EOB and aware to call for assistance once ready to lay back down. )        Time: 2297-9892 OT Time Calculation (min): 27 min  Charges: OT General Charges $OT Visit: 1 Procedure OT Treatments $Self Care/Home Management : 23-37 mins  Travon Crochet, Thereasa Parkin 10/10/2015, 12:46 PM

## 2015-10-10 NOTE — Progress Notes (Signed)
IP PROGRESS NOTE  Subjective:  He reports ambulating in the hall. He is eating breakfast.  Objective: Vital signs in last 24 hours: Blood pressure 125/83, pulse 92, temperature 99.2 F (37.3 C), temperature source Oral, resp. rate 16, height 6\' 3"  (1.905 m), weight 232 lb 5.8 oz (105.4 kg), SpO2 99 %.  Intake/Output from previous day: 10/09 0701 - 10/10 0700 In: 1314.8 [P.O.:1074; I.V.:240.8] Out: 1000 [Urine:1000]  Physical Exam: Cardiovascular: Regular rate and rhythm Lungs: Clear anteriorly, no respiratory distress  Abdomen: Distended, nontender Extremities: Pitting edema throughout the legs bilaterally, left greater than right-improved   Portacath/PICC-without erythema  Lab Results:  Recent Labs  10/10/15 0648  WBC 8.4  HGB 10.2*  HCT 32.4*  PLT 190    BMET  Recent Labs  10/10/15 0648  NA 137  K 2.4*  CL 102  CO2 28  GLUCOSE 111*  BUN 14  CREATININE 0.70  CALCIUM 7.5*    Medications: I have reviewed the patient's current medications.  Assessment/Plan:  1. 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, status post a colonoscopy 07/21/2015 confirming a sigmoid colon mass  Staging CT scans 07/13/2015 and 07/14/2015 confirmed extensive liver metastases, lung nodules, a right pleural effusion, and porta hepatis lymphadenopathy  Cycle 1 FOLFOX (chemotherapy doses reduced and bolus 5-FU eliminated) 08/02/2015  Cycle 2 FOLFOX (chemotherapy doses reduced and bolus 5-FU eliminated) 08/16/2015  Cycle 3 FOLFOX (Avastin added, bolus 5-FU introduced, and infusional 5-FU dose escalated) 08/30/2015  Cycle 4 FOLFOX plus Avastin 09/13/2015  Cycle 5 held on 09/27/2015 due to mild neutropenia, recent diarrhea.  CT of the chest 10/02/2015, compared to a CT from 07/13/2015-no significant change in lung nodules or liver metastases  Diagnostic/therapeutic paracentesis 10/05/2015-negative cytology  2. Obstructive jaundice, likely secondary to porta hepatis  lymphadenopathy, status post placement of a bile duct stent on 07/14/2015  3. Microcytic anemia secondary to the colon mass-improved  4. Diabetes  5. Failure to thrive secondary to metastatic colon cancer  6. Port-A-Cath placement 08/01/2015  7. Nausea following cycle 1 and cycle 2 FOLFOX-emend added with cycle 3  8. Diarrhea following cycle 4 FOLFOX/Avastin.  9. Neutropenia following cycle 4 FOLFOX/Avastin. Neulasta to be added with cycle 5.  10.  Admission 10/02/2015 with bilateral DVTs and pulmonary emboli-maintained on heparin, scheduled to begin Xarelto 10/07/2015   He appears unchanged. He needs to continue physical therapy in a rehabilitation facility. We will schedule the next cycle of chemotherapy for later this week.  Recommendations: 1. Continue Xarelto 3. Continue physical therapy 3. Outpatient follow-up and the next cycle of chemotherapy will be scheduled at the [facility] for later this week     LOS: 8 days   [place]  10/10/2015, 2:31 PM

## 2015-10-10 NOTE — Progress Notes (Signed)
CRITICAL VALUE ALERT  Critical value received:  K+ 2.4  Date of notification:  10/10/15   Time of notification:  0740  Critical value read back: Yes   Nurse who received alert:  Elwin Sleight, RN   MD notified (1st page): [facility]   Time of first page:  [phone]  MD notified (2nd page):  Time of second page:  Responding MD:  Hartford Poli   Time MD responded: [phone]

## 2015-10-10 NOTE — Progress Notes (Signed)
ANTICOAGULATION CONSULT NOTE - Follow Up Consult  Pharmacy Consult for Xarelto Indication: pulmonary embolus and DVT  No Known Allergies  Patient Measurements: Height: 6\' 3"  (190.5 cm) Weight: 232 lb 5.8 oz (105.4 kg) IBW/kg (Calculated) : 84.5 Heparin Dosing Weight: 108 kg  Vital Signs: Temp: 98.5 F (36.9 C) (10/10 0623) Temp Source: Oral (10/10 0623) BP: 132/88 mmHg (10/10 0623) Pulse Rate: 84 (10/10 0623)  Labs:  Recent Labs  10/10/15 0648  HGB 10.2*  HCT 32.4*  PLT 190  CREATININE 0.70    Estimated Creatinine Clearance: 129 mL/min (by C-G formula based on Cr of 0.7).  Medications:  Infusions:  . sodium chloride 10 mL/hr at 10/07/15 1028    Assessment: 67 yoM with colorectal cancer currently undergoing chemotherapy (FOLFOX q14d) presents on 10/2 with weakness, hypotension. Lactic acid elevated, code sepsis called, and started on broad spectrum antibiotics. He was also found to have RV dilation on cardiac [place]; CT confirmed saddle PE and Dopplers revealed bilateral DVTs.  Pharmacy was consulted to start Heparin drip, then transition to [place] on 10/7.  Today, 10/10/2015:  SCr WNL, CrCl>100 ml/min  CBC:  Hgb low/stable at 10.2, Plt remain WNL  No bleeding or adverse events reported.  Patient education completed on 10/7, discount card provided to patient.  Confirmed that patient's regular pharmacy, [facility], has starter pack in stock and reserved for Jaime [name]  Goal of Therapy:  VTE Treatment   Plan:  Continue Xarelto 15 mg BID with meals x 21 days, then on 10/28/15 change to 20mg  once daily with supper.   Hershal Coria, PharmD, BCPS Pager: [phone] 10/10/2015 7:57 AM

## 2015-10-10 NOTE — Clinical Social Work Placement (Addendum)
   CLINICAL SOCIAL WORK PLACEMENT  NOTE  Date:  10/10/2015  Patient Details  Name: Jaime [name] MRN: [id number] Date of Birth: [birth year]  Clinical Social Work is seeking post-discharge placement for this patient at the [place] level of care (*CSW will initial, date and re-position this form in  chart as items are completed):  Yes   Patient/family provided with [place] Work Department's list of facilities offering this level of care within the geographic area requested by the patient (or if unable, by the patient's family).  Yes   Patient/family informed of their freedom to choose among providers that offer the needed level of care, that participate in Medicare, Medicaid or managed care program needed by the patient, have an available bed and are willing to accept the patient.  Yes   Patient/family informed of [facility]'s ownership interest in [facility] and [facility], as well as of the fact that they are under no obligation to receive care at these facilities.  PASRR submitted to EDS on 10/10/15     PASRR number received on 10/10/15     Existing PASRR number confirmed on       FL2 transmitted to all facilities in geographic area requested by pt/family on 10/09/15     FL2 transmitted to all facilities within larger geographic area on       Patient informed that his/her managed care company has contracts with or will negotiate with certain facilities, including the following:   (Yes)     Yes   Patient/family informed of bed offers received.  Patient chooses bed at  [facility])     Physician recommends and patient chooses bed at      Patient to be transferred to   [facility] on   10/12/2015   Patient to be transferred to facility by  [facility]     Patient family notified on   10/12/2015 of transfer.  Name of family member notified:    wife- [facility]    PHYSICIAN       Additional Comment:     _______________________________________________  Eduard Clos, MSW, LCSWA  [phone]

## 2015-10-10 NOTE — Progress Notes (Signed)
Patient has [place], [phone] [id number].  Bed offers given, family encouraged to contact [facility] to discuss (no other facilities offered bed).  SNF will need to seek insurance authorization.  Edwyna Shell, LCSW Clinical Social Worker

## 2015-10-10 NOTE — Progress Notes (Signed)
PT Cancellation Note  Patient Details Name: Jaime [name] MRN: [id number] DOB: 1956-07-07   Cancelled Treatment:    Reason Eval/Treat Not Completed: 2nd attempt for PT tx session-pt declined due to not feeling well/nausea. Will check back another day.    Weston Anna, MPT Pager: [phone]

## 2015-10-10 NOTE — Progress Notes (Signed)
PT Cancellation Note  Patient Details Name: Jaime [name] MRN: [id number] DOB: 1956-10-28   Cancelled Treatment:    Reason Eval/Treat Not Completed: Attempted Pt tx session-OOB to BSc/recliner-pt refused at this time. Requested to be placed on bedpan instead despite encouragement from therapist/nurse. Will check back if schedule allows.  Note: may need to consider ST rehab placement especially if pt continues to decline attempts at [place] with therapy. Thanks.    Weston Anna, MPT Pager: [phone]

## 2015-10-11 ENCOUNTER — Encounter: Payer: Self-pay | Admitting: *Deleted

## 2015-10-11 ENCOUNTER — Other Ambulatory Visit: Payer: Self-pay | Admitting: *Deleted

## 2015-10-11 ENCOUNTER — Encounter: Payer: Managed Care, Other (non HMO) | Admitting: Nutrition

## 2015-10-11 ENCOUNTER — Telehealth: Payer: Self-pay | Admitting: Oncology

## 2015-10-11 ENCOUNTER — Ambulatory Visit: Payer: Managed Care, Other (non HMO)

## 2015-10-11 ENCOUNTER — Ambulatory Visit: Payer: Managed Care, Other (non HMO) | Admitting: Oncology

## 2015-10-11 ENCOUNTER — Other Ambulatory Visit: Payer: Managed Care, Other (non HMO)

## 2015-10-11 DIAGNOSIS — C189 Malignant neoplasm of colon, unspecified: Secondary | ICD-10-CM

## 2015-10-11 DIAGNOSIS — E876 Hypokalemia: Secondary | ICD-10-CM

## 2015-10-11 LAB — GLUCOSE, CAPILLARY
Glucose-Capillary: 122 mg/dL — ABNORMAL HIGH (ref 65–99)
Glucose-Capillary: 129 mg/dL — ABNORMAL HIGH (ref 65–99)

## 2015-10-11 LAB — BASIC METABOLIC PANEL
Anion gap: 7 (ref 5–15)
BUN: 14 mg/dL (ref 6–20)
CHLORIDE: 102 mmol/L (ref 101–111)
CO2: 28 mmol/L (ref 22–32)
Calcium: 7.6 mg/dL — ABNORMAL LOW (ref 8.9–10.3)
Creatinine, Ser: 0.69 mg/dL (ref 0.61–1.24)
GFR calc Af Amer: >60 mL/min (ref >60)
GFR calc non Af Amer: >60 mL/min (ref >60)
GLUCOSE: 114 mg/dL — AB (ref 65–99)
POTASSIUM: 3.3 mmol/L — AB (ref 3.5–5.1)
Sodium: 137 mmol/L (ref 135–145)

## 2015-10-11 LAB — MAGNESIUM: MAGNESIUM: 1.8 mg/dL (ref 1.7–2.4)

## 2015-10-11 MED ORDER — TRAMADOL HCL 50 MG PO TABS: 50.0000 mg | ORAL_TABLET | Freq: Four times a day (QID) | ORAL | Status: DC | PRN

## 2015-10-11 MED ORDER — RIVAROXABAN 15 MG PO TABS: 15.0000 mg | ORAL_TABLET | Freq: Two times a day (BID) | ORAL | Status: DC

## 2015-10-11 MED ORDER — FUROSEMIDE 40 MG PO TABS: 40.0000 mg | ORAL_TABLET | Freq: Two times a day (BID) | ORAL | Status: DC

## 2015-10-11 MED ORDER — CETYLPYRIDINIUM CHLORIDE 0.05 % MT LIQD: 7.0000 mL | Freq: Two times a day (BID) | OROMUCOSAL | Status: AC

## 2015-10-11 MED ORDER — ENSURE ENLIVE PO LIQD: 237.0000 mL | Freq: Two times a day (BID) | ORAL | Status: AC

## 2015-10-11 MED ORDER — POTASSIUM CHLORIDE CRYS ER 20 MEQ PO TBCR
40.0000 meq | EXTENDED_RELEASE_TABLET | Freq: Once | ORAL | Status: AC
  Administered 2015-10-11: 40 meq via ORAL
  Filled 2015-10-11: qty 2

## 2015-10-11 NOTE — Progress Notes (Signed)
Received call from [facility] regarding pump instructions. As left on her VM previously today, the following information for chemotherapy pump was discussed for nursing facility, per their request:  5FU chemo pump will be infusing via PAC over 46 hrs continuously at low dose.  Pt does not need to be on any precautions, nor does it require private room; however, due to patient being immunocompromised, this would be ideal to prevent infection. Pump has phone number with serial number in case pump begins to beep or if troubleshooting is required.  If beeping, line should be checked to ensure there aren't any kinks; if so, when kinks are removed, beeping will start. No need to push any buttons; pump is locked at a level where rate cannot be adjusted.  If pump begins to beep and says "empty" on pump screen, hold stop button until stopped, then on/off button to turn off (this is okay to do, as it does not affect the rate, and if beeping empty, infusion is complete, even if there is some medication left in bag).  Any other instructions should be obtained from infusion pump manufacturers, or they may call our office at [phone].  Pt should have been given a chemo spill kit in the beginning of treatment; if he or his wife still has it, this should stay with nursing facility.  If another spill kit is needed, wife can come by infusion and obtain one, as the spill kit has instructions and supplies needed to handle spill in rare case that the chemotherapy line is broken.  Marcie Bal made aware that this note has been typed and will call if additional information is needed. Left message at [phone]

## 2015-10-11 NOTE — Telephone Encounter (Signed)
Per alexis we can cancel pof as dr Benay Spice did not realize that patient was already on schedule

## 2015-10-11 NOTE — Discharge Summary (Addendum)
Physician Discharge Summary  Jaime [name] QJF:[id number] DOB: [birth year] DOA: 10/02/2015  PCP: Velna Hatchet, MD  Admit date: 10/02/2015 Discharge date: 10/12/2015  Recommendations for Outpatient Follow-up:  1. Continue xarelto on discharge (started 10/7 and needs to be on BID regimen for 21 days (through 10/28), then continue once day regimen 20 mg daily    WOC wound consult note Stage 2 Pressure injury at coccyx; wife had been treating at home with hydrocolloid dressings. Wound type:Pressure plus moisture from incontinence (IAD) Measurement: 3.5cm x 6cm area with two distinct area of partial thickness tissue loss the largest of which measures 2cm x 3cm x 0.2cm Wound bed: Pink, moist wound bed with scant serous exudate and rolled epidermis at the periphery Periwound:Intact with blanching erythema. Bilateral hips with discoloration (brown) but blanchable and no break in the integument. Dressing procedure/placement/frequency:Bilateralk hips to continue with soft silicone foam dressings. Implement a therapeutic mattress with low air loss feature for microclimate management and to reduce friction and shear forces. Cleanse and cover the affected area with a clear moisture barrier cream. Bilateral pressure redistribution heel boots are provided while he is in bed due to the edema and excess weight placed on the heels as well as a pressure redistribution chair pad for OOB use.   Discharge Diagnoses:  Principal Problem:   Acute saddle pulmonary embolism with acute cor pulmonale (HCC) Active Problems:   Malnutrition of moderate degree (HCC)   Colon cancer (HCC)   PAF (paroxysmal atrial fibrillation) (HCC)   Ascites   Chronic diastolic congestive heart failure (HCC)   Anemia of chronic disease    Discharge Condition: stable   Diet recommendation: as tolerated   History of present illness:  59 year old male with past medical history of stage IV colon cancer diagnosed 07/2015, started  on chemotherapy with FOLFOX, added Avastin on 08/30/2015 with last chemotherapy about 3 weeks prior to this admission who presented to [facility] ED 10/02/2015 with worsening left leg swelling for past 2 weeks prior to this admission.   LE doppler demonstrated extensive, occlusive, acute DVT throughout the bilateral lower extremities, beginning in the posterior tibial vein and the peroneal vein and coursing through to the popliteal, femoral, and common femoral veins. His CT angio chest demonstrated saddle PE. He ws also hypotensive and tachycardic. Pt treated initially for septic shock and has required levophed in ED. Source of infection not identified.   Hospital Course:    Assessment/Plan:    Principal Problem: Saddle PE with bilateral lower leg extremity DVT - Secondary to malignancy - Continue xarelto on discharge   Active Problems: Metastatic stage IV colon cancer with liver metastases and ascites / Pulmonary metastases  - S/P therapeutic paracentesis on 10/5 with 6 L yellow fluid removed  - Peritoneal fluid cytology did not show malignant cells.  Chronic diastolic CHF - 2 D ECHO on this admission showed grade 1 diastolic dysfunction with preserved EF - Continue lasix 40 mg twice a day   Atrial fibrillation - CHADS vasc score 2 accounting for age ad CHF - Continue xarelto BID through 10/28 then once a day 20 mg   Generalized weakness / Failure to thrive / Moderate protein calorie malnutrition  - In the context of malignancy, pulmonary embolism and DVT - Continue nutritional supplementation  - SNF on discharge   Anemia of chronic disease  - Secondary to malignancy  - No current indications for transfusion   Pressure ulcer, stage 2, coccyx  - WOC consulted during hospital stay  - Silicone  foam dressing ordered   Hypokalemia - Due to lasix - Supplemented   Hypomagnesemia -Supplemented     DVT Prophylaxis  - Continue xarelto on discharge    Code Status: Full.   Family Communication: plan of care discussed with the patient and his wife at the bedside    IV access:  Peripheral IV  Procedures and diagnostic studies:   US Paracentesis 10/05/2015 Successful ultrasound-guided diagnostic and therapeutic paracentesis yielding 6 liters of peritoneal fluid. Read by: Rowe Robert, PA-C Electronically Signed By: Jerilynn Mages. Shick M.D. On: 10/05/2015 16:24   Ct Angio Chest Pe W/cm &/or Wo Cm 10/02/2015 1. New central and segmental bilateral pulmonary emboli with equivocal suggestion of right heart strain due to submassive (intermediate risk) PE. 2. Large amount of abdominal ascites, significantly increased from prior study. 3. Progression of pulmonary and mediastinal metastatic disease.  Dg Chest Port 1 View 10/02/2015 Hypoventilation with bibasilar atelectasis. Multiple lung nodules noted from prior chest CT.   Bilateral DVT 10/02/2015 There is extensive, occlusive, acute DVT throughout the bilateral lower extremities, beginning in the posterior tibial vein and the peroneal vein and coursing through to the popliteal, femoral, and common femoral veins.  Medical Consultants:  PCCM Oncology  Other Consultants:  None  Signed:  Leisa Lenz, MD  [facility] 10/11/2015, 4:39 PM  Pager #: [phone]  Time spent in minutes: more than 30 minutes   Discharge Exam: Filed Vitals:   10/11/15 1639  BP:   Pulse:   Temp: 98.7 F (37.1 C)  Resp:    Filed Vitals:   10/11/15 0500 10/11/15 0540 10/11/15 1316 10/11/15 1639  BP:  142/88 130/94   Pulse:  74 97   Temp:  97.8 F (36.6 C) 99.4 F (37.4 C) 98.7 F (37.1 C)  TempSrc:  Oral Oral Oral  Resp:  18 18   Height:      Weight: 104.917 kg (231 lb 4.8 oz)     SpO2:  97% 100%     General: Pt is alert, follows commands appropriately, not in acute distress Cardiovascular: Regular rate and rhythm, S1/S2 appreciated Respiratory: Clear to auscultation bilaterally, no wheezing,  no crackles, no rhonchi Abdominal: Soft, non tender, non distended, bowel sounds +, no guarding Extremities: no cyanosis, pulses palpable bilaterally DP and PT Neuro: Grossly nonfocal  Discharge Instructions  Discharge Instructions    Call MD for:  difficulty breathing, headache or visual disturbances    Complete by:  As directed      Call MD for:  persistant dizziness or light-headedness    Complete by:  As directed      Call MD for:  persistant nausea and vomiting    Complete by:  As directed      Call MD for:  severe uncontrolled pain    Complete by:  As directed      Diet - low sodium heart healthy    Complete by:  As directed      Increase activity slowly    Complete by:  As directed             Medication List    STOP taking these medications        ferrous sulfate 325 (65 FE) MG tablet     potassium chloride SA 20 MEQ tablet  Commonly known as:  K-DUR,KLOR-CON     PRESCRIPTION MEDICATION      TAKE these medications        antiseptic oral rinse 0.05 % Liqd solution  Commonly  known as:  CPC / CETYLPYRIDINIUM CHLORIDE 0.05%  7 mLs by Mouth Rinse route 2 (two) times daily.     diphenoxylate-atropine 2.5-0.025 MG tablet  Commonly known as:  LOMOTIL  Take 1 tablet by mouth 4 (four) times daily as needed for diarrhea or loose stools.     feeding supplement (ENSURE ENLIVE) Liqd  Take 237 mLs by mouth 2 (two) times daily between meals.     furosemide 40 MG tablet  Commonly known as:  LASIX  Take 1 tablet (40 mg total) by mouth 2 (two) times daily.     lidocaine-prilocaine cream  Commonly known as:  EMLA  Apply small amount of cream over port area 1-2 hours prior to treatment and cover with plastic wrap.  DO NOT RUB IN     ondansetron 8 MG tablet  Commonly known as:  ZOFRAN  Take 1 tablet (8 mg total) by mouth every 8 (eight) hours as needed for nausea or vomiting.     potassium chloride 20 MEQ packet  Commonly known as:  KLOR-CON  Take 20 mEq by mouth  daily.     prochlorperazine 10 MG tablet  Commonly known as:  COMPAZINE  TAKE 1 TABLET (10 MG TOTAL) BY MOUTH EVERY 6 (SIX) HOURS AS NEEDED FOR NAUSEA OR VOMITING.     Rivaroxaban 15 MG Tabs tablet  Commonly known as:  XARELTO  Take 1 tablet (15 mg total) by mouth 2 (two) times daily with a meal.     traMADol 50 MG tablet  Commonly known as:  ULTRAM  Take 1 tablet (50 mg total) by mouth every 6 (six) hours as needed for moderate pain.           Follow-up Information    Follow up with Velna Hatchet, MD. Schedule an appointment as soon as possible for a visit in 1 week.   Specialty:  Internal Medicine   Why:  Follow up appt after recent hospitalization   Contact information:   [place] [address] [phone]        The results of significant diagnostics from this hospitalization (including imaging, microbiology, ancillary and laboratory) are listed below for reference.    Significant Diagnostic Studies: Ct Angio Chest Pe W/cm &/or Wo Cm  10/02/2015   CLINICAL DATA:  Weakness. Hypotension. 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 Has htn, dm.  EXAM: CT ANGIOGRAPHY CHEST WITH CONTRAST  TECHNIQUE: Multidetector CT imaging of the chest was performed using the standard protocol during bolus administration of intravenous contrast. Multiplanar CT image reconstructions and MIPs were obtained to evaluate the vascular anatomy.  CONTRAST:  165mL OMNIPAQUE IOHEXOL 350 MG/ML SOLN  COMPARISON:  07/14/2015  FINDINGS: Contrast injection via right IJ power port. RV/LV ratio 1.05, borderline increased. However, the RV is less dilated than seen on prior study. There is a saddle embolus at the bifurcation of the main pulmonary artery with central emboli in the right upper lobe pulmonary artery and left lower lobe pulmonary artery branches, and more peripheral segmental emboli in lingular, left upper lobe, and right lower lobe  branches.  Moderate right and tiny left pleural effusions.  Progressive sub carinal adenopathy. Subcentimeter precarinal node and small anterior mediastinal nodes. No definite hilar adenopathy.  12 mm lingular pulmonary nodule. Multiple left lower lobe pulmonary nodules, largest in the lateral basal segment measured and 16 mm, increased from previous exam. Dependent atelectasis in the right lower lobe, with several pulmonary nodules  still conspicuous. Multiple small pulmonary nodules in both upper lobes largely stable. Thoracic spine and sternum intact. Multiple large hepatic metastases. There is a large amount of abdominal ascites, significantly increased from previous exam.  Review of the MIP images confirms the above findings.  IMPRESSION: 1. New central and segmental bilateral pulmonary emboli with equivocal suggestion of right heart strain due to submassive (intermediate risk) PE. 2. Large amount of abdominal ascites, significantly increased from prior study. 3. Progression of pulmonary and mediastinal metastatic disease.  Critical Value/emergent results were called by telephone at the time of interpretation on 10/02/2015 at 12:37 pm to Dr. Deno Etienne , who verbally acknowledged these results.   Electronically Signed   By: Lucrezia Europe M.D.   On: 10/02/2015 12:38   US Paracentesis  10/05/2015   INDICATION: Metastatic colon carcinoma, recent PE/DVT, ascites. Request is made for diagnostic and therapeutic paracentesis up to 6 liters.  EXAM: ULTRASOUND-GUIDED DIAGNOSTIC AND THERAPEUTIC PARACENTESIS  COMPARISON:  None.  MEDICATIONS: None.  COMPLICATIONS: None immediate  TECHNIQUE: Informed written consent was obtained from the patient after a discussion of the risks, benefits and alternatives to treatment. A timeout was performed prior to the initiation of the procedure.  Initial ultrasound scanning demonstrates a large amount of ascites within the left lower abdominal quadrant. The left lower abdomen was prepped  and draped in the usual sterile fashion. 1% lidocaine was used for local anesthesia. Under direct ultrasound guidance, a 19 gauge, 10-cm, Yueh catheter was introduced. An ultrasound image was saved for documentation purposed. The paracentesis was performed. The catheter was removed and a dressing was applied. The patient tolerated the procedure well without immediate post procedural complication.  FINDINGS: A total of approximately 6 liters of yellow fluid was removed. Samples were sent to the laboratory as requested by the clinical team.  IMPRESSION: Successful ultrasound-guided diagnostic and therapeutic paracentesis yielding 6 liters of peritoneal fluid.  Read by: Rowe Robert, PA-C   Electronically Signed   By: Jerilynn Mages.  Shick M.D.   On: 10/05/2015 16:24   Dg Chest Port 1 View  10/02/2015   CLINICAL DATA:  Weakness.  Metastatic cancer.  EXAM: PORTABLE CHEST 1 VIEW  COMPARISON:  Chest CT 07/14/2015  FINDINGS: Port-A-Cath tip in the SVC.  Hypoventilation with decreased lung volume. Right lower lobe atelectasis. Mild left lower lobe atelectasis also noted.  Multiple lung nodules are noted on the prior CT. These are small and not well seen on the chest x-ray.  IMPRESSION: Hypoventilation with bibasilar atelectasis. Multiple lung nodules noted from prior chest CT.   Electronically Signed   By: Franchot Gallo M.D.   On: 10/02/2015 11:47    Microbiology: Recent Results (from the past 240 hour(s))  Blood Culture (routine x 2)     Status: None   Collection Time: 10/02/15 10:50 AM  Result Value Ref Range Status   Specimen Description BLOOD CHEST PORT  3 ML IN YELLOW BOTTLE  Final   Special Requests NONE  Final   Culture   Final    NO GROWTH 5 DAYS Performed at [facility]    Report Status 10/07/2015 FINAL  Final  Blood Culture (routine x 2)     Status: None   Collection Time: 10/02/15 11:00 AM  Result Value Ref Range Status   Specimen Description BLOOD LEFT HAND  1 ML IN YELLOW BOTTLE  Final    Special Requests NONE  Final   Culture   Final    NO GROWTH 5 DAYS  Performed at [facility]    Report Status 10/07/2015 FINAL  Final  Urine culture     Status: None   Collection Time: 10/02/15  1:45 PM  Result Value Ref Range Status   Specimen Description URINE, CLEAN CATCH  Final   Special Requests Immunocompromised  Final   Culture   Final    NO GROWTH 1 DAY Performed at [facility]    Report Status 10/03/2015 FINAL  Final  MRSA PCR Screening     Status: None   Collection Time: 10/02/15  3:50 PM  Result Value Ref Range Status   MRSA by PCR NEGATIVE NEGATIVE Final    Comment:        The GeneXpert MRSA Assay (FDA approved for NASAL specimens only), is one component of a comprehensive MRSA colonization surveillance program. It is not intended to diagnose MRSA infection nor to guide or monitor treatment for MRSA infections.      Labs: Basic Metabolic Panel:  Recent Labs Lab 10/05/15 0445 10/10/15 0648 10/11/15 0458  NA 140 137 137  K 3.5 2.4* 3.3*  CL 108 102 102  CO2 25 28 28   GLUCOSE 110* 111* 114*  BUN 16 14 14   CREATININE 0.79 0.70 0.69  CALCIUM 7.8* 7.5* 7.6*  MG  --   --  1.8   Liver Function Tests: No results for input(s): AST, ALT, ALKPHOS, BILITOT, PROT, ALBUMIN in the last 168 hours. No results for input(s): LIPASE, AMYLASE in the last 168 hours. No results for input(s): AMMONIA in the last 168 hours. CBC:  Recent Labs Lab 10/05/15 0445 10/06/15 0519 10/10/15 0648  WBC 4.5 4.4 8.4  HGB 10.1* 9.8* 10.2*  HCT 32.8* 31.4* 32.4*  MCV 88.2 88.2 86.9  PLT 176 150 190   Cardiac Enzymes: No results for input(s): CKTOTAL, CKMB, CKMBINDEX, TROPONINI in the last 168 hours. BNP: BNP (last 3 results) No results for input(s): BNP in the last 8760 hours.  ProBNP (last 3 results) No results for input(s): PROBNP in the last 8760 hours.  CBG:  Recent Labs Lab 10/10/15 0723 10/10/15 1134 10/10/15 1645 10/11/15 0721  10/11/15 1313  GLUCAP 107* 196* 147* 122* 129*

## 2015-10-11 NOTE — Progress Notes (Signed)
CSW is working with [facility] to provide specifics related to the "chemo pump".  CSW has also provided clinicals to [facility] for SNF auth- wife contacted CSW this morning and we are tentative for tomorrow for transfer to [facility] SNF bed.  Eduard Clos, MSW, [place]

## 2015-10-11 NOTE — Progress Notes (Signed)
PT Cancellation Note  Patient Details Name: Jaime [name] MRN: [id number] DOB: 02-May-1956   Cancelled Treatment:     Pt refused any attempts to participate.  Offered the bathroom.  Offered the recliner.  Asked if we could just sit on the side of the bed.  Pt stated "I am not getting up today".  "The Doctor said I could stay in bed".  "I am leaving tomorrow".     Nathanial Rancher 10/11/2015, 3:46 PM

## 2015-10-11 NOTE — Care Management Note (Signed)
Case Management Note  Patient Details  Name: Jaime [name] MRN: [id number] Date of Birth: 30-Oct-1956  Subjective/Objective: Per MD, pale,f/u hgb. patient not medically stable for d/c today.will d/c in am.                   Action/Plan:d/c am SNF.   Expected Discharge Date:                 Expected Discharge Plan:  Skilled Nursing Facility  In-House Referral:     Discharge planning Services  CM Consult  Post Acute Care Choice:  Durable Medical Equipment Choice offered to:  Patient, Spouse  DME Arranged:    DME Agency:     HH Arranged:   (Had HHPT pta) [place] Agency:     Status of Service:  Completed, signed off  Medicare Important Message Given:    Date Medicare IM Given:    Medicare IM give by:    Date Additional Medicare IM Given:    Additional Medicare Important Message give by:     If discussed at [place] of Stay Meetings, dates discussed:    Additional Comments:  Dessa Phi, RN 10/11/2015, 1:07 PM

## 2015-10-12 DIAGNOSIS — L8992 Pressure ulcer of unspecified site, stage 2: Secondary | ICD-10-CM

## 2015-10-12 LAB — CBC
HCT: 33.6 % — ABNORMAL LOW (ref 39.0–52.0)
HEMOGLOBIN: 10.5 g/dL — AB (ref 13.0–17.0)
MCH: 27.7 pg (ref 26.0–34.0)
MCHC: 31.3 g/dL (ref 30.0–36.0)
MCV: 88.7 fL (ref 78.0–100.0)
PLATELETS: 237 10*3/uL (ref 150–400)
RBC: 3.79 MIL/uL — AB (ref 4.22–5.81)
RDW: 17.9 % — ABNORMAL HIGH (ref 11.5–15.5)
WBC: 9.6 10*3/uL (ref 4.0–10.5)

## 2015-10-12 LAB — GLUCOSE, CAPILLARY
Glucose-Capillary: 99 mg/dL (ref 65–99)
Glucose-Capillary: 157 mg/dL — ABNORMAL HIGH (ref 65–99)

## 2015-10-12 LAB — BASIC METABOLIC PANEL
Anion gap: 5 (ref 5–15)
BUN: 14 mg/dL (ref 6–20)
CHLORIDE: 101 mmol/L (ref 101–111)
CO2: 29 mmol/L (ref 22–32)
Calcium: 7.9 mg/dL — ABNORMAL LOW (ref 8.9–10.3)
Creatinine, Ser: 0.6 mg/dL — ABNORMAL LOW (ref 0.61–1.24)
GFR calc Af Amer: >60 mL/min (ref >60)
GFR calc non Af Amer: >60 mL/min (ref >60)
GLUCOSE: 110 mg/dL — AB (ref 65–99)
POTASSIUM: 3.7 mmol/L (ref 3.5–5.1)
Sodium: 135 mmol/L (ref 135–145)

## 2015-10-12 LAB — CEA: CEA: 4294 ng/mL — AB (ref 0.0–4.7)

## 2015-10-12 MED ORDER — HEPARIN SOD (PORK) LOCK FLUSH 100 UNIT/ML IV SOLN
500.0000 [IU] | INTRAVENOUS | Status: AC | PRN
  Administered 2015-10-12: 500 [IU]

## 2015-10-12 NOTE — Progress Notes (Addendum)
Discussed transfer with  Representative Lorenso Courier)  at [place]

## 2015-10-12 NOTE — Progress Notes (Signed)
Pt seen and examined at the bedside Pt medically stable for discharge to SNF. No changes in management since 10/11/2015. Please refer to discharge summary done 10/11/2015.  Leisa Lenz [facility] [phone]

## 2015-10-12 NOTE — Plan of Care (Signed)
Problem: Phase I Progression Outcomes Goal: OOB as tolerated unless otherwise ordered Outcome: Not Met (add Reason) Pt has been bed bound on air mattress, generalized weakness. Pt dc'd to rehab

## 2015-10-12 NOTE — Progress Notes (Signed)
Patient for d/c today to SNF bed at [facility] rec'd. WIfe and patient agreeable to this plan- wife somewhat tearful about "leaving him" and not being with him when he has chemo- support provided- she is able to take off work with an Event organiser and plans to use it as needed. Plan transfer via EMS. Eduard Clos, MSW, [place]

## 2015-10-14 ENCOUNTER — Other Ambulatory Visit: Payer: Self-pay | Admitting: Nurse Practitioner

## 2015-10-16 ENCOUNTER — Other Ambulatory Visit: Payer: Self-pay | Admitting: Oncology

## 2015-10-17 ENCOUNTER — Telehealth: Payer: Self-pay | Admitting: Oncology

## 2015-10-17 NOTE — Telephone Encounter (Signed)
Returned call to wife re confirming appointments for 61/68 - duplicate lab/flush cxd.

## 2015-10-18 ENCOUNTER — Other Ambulatory Visit: Payer: Managed Care, Other (non HMO)

## 2015-10-18 ENCOUNTER — Other Ambulatory Visit (HOSPITAL_COMMUNITY)
Admission: RE | Admit: 2015-10-18 | Discharge: 2015-10-18 | Disposition: A | Discharge status: Home / Self Care | Payer: Managed Care, Other (non HMO) | Source: Ambulatory Visit | Attending: Family Medicine | Admitting: Family Medicine

## 2015-10-18 ENCOUNTER — Encounter (HOSPITAL_COMMUNITY): Payer: Self-pay | Admitting: Emergency Medicine

## 2015-10-18 ENCOUNTER — Inpatient Hospital Stay (HOSPITAL_COMMUNITY)
Admission: EM | Admit: 2015-10-18 | Discharge: 2015-11-01 | DRG: 374 | Disposition: A | Discharge status: Hospice | Payer: Managed Care, Other (non HMO) | Attending: Internal Medicine | Admitting: Internal Medicine

## 2015-10-18 ENCOUNTER — Emergency Department (HOSPITAL_COMMUNITY): Payer: Managed Care, Other (non HMO)

## 2015-10-18 ENCOUNTER — Other Ambulatory Visit (HOSPITAL_BASED_OUTPATIENT_CLINIC_OR_DEPARTMENT_OTHER): Payer: Managed Care, Other (non HMO)

## 2015-10-18 ENCOUNTER — Ambulatory Visit (HOSPITAL_BASED_OUTPATIENT_CLINIC_OR_DEPARTMENT_OTHER): Payer: Managed Care, Other (non HMO) | Admitting: Oncology

## 2015-10-18 ENCOUNTER — Ambulatory Visit (HOSPITAL_BASED_OUTPATIENT_CLINIC_OR_DEPARTMENT_OTHER): Payer: Managed Care, Other (non HMO)

## 2015-10-18 ENCOUNTER — Ambulatory Visit: Payer: Managed Care, Other (non HMO) | Admitting: Nutrition

## 2015-10-18 VITALS — BP 60/36 | HR 102 | Resp 18 | Ht 75.0 in

## 2015-10-18 DIAGNOSIS — I959 Hypotension, unspecified: Secondary | ICD-10-CM

## 2015-10-18 DIAGNOSIS — R197 Diarrhea, unspecified: Secondary | ICD-10-CM

## 2015-10-18 DIAGNOSIS — K521 Toxic gastroenteritis and colitis: Secondary | ICD-10-CM | POA: Diagnosis present

## 2015-10-18 DIAGNOSIS — Z66 Do not resuscitate: Secondary | ICD-10-CM | POA: Diagnosis not present

## 2015-10-18 DIAGNOSIS — D509 Iron deficiency anemia, unspecified: Secondary | ICD-10-CM | POA: Diagnosis present

## 2015-10-18 DIAGNOSIS — I82403 Acute embolism and thrombosis of unspecified deep veins of lower extremity, bilateral: Secondary | ICD-10-CM | POA: Diagnosis not present

## 2015-10-18 DIAGNOSIS — T451X5A Adverse effect of antineoplastic and immunosuppressive drugs, initial encounter: Secondary | ICD-10-CM | POA: Diagnosis present

## 2015-10-18 DIAGNOSIS — C189 Malignant neoplasm of colon, unspecified: Secondary | ICD-10-CM

## 2015-10-18 DIAGNOSIS — N179 Acute kidney failure, unspecified: Secondary | ICD-10-CM | POA: Diagnosis present

## 2015-10-18 DIAGNOSIS — I110 Hypertensive heart disease with heart failure: Secondary | ICD-10-CM | POA: Diagnosis present

## 2015-10-18 DIAGNOSIS — R531 Weakness: Secondary | ICD-10-CM | POA: Diagnosis present

## 2015-10-18 DIAGNOSIS — I480 Paroxysmal atrial fibrillation: Secondary | ICD-10-CM | POA: Diagnosis present

## 2015-10-18 DIAGNOSIS — R188 Other ascites: Secondary | ICD-10-CM | POA: Diagnosis present

## 2015-10-18 DIAGNOSIS — E119 Type 2 diabetes mellitus without complications: Secondary | ICD-10-CM

## 2015-10-18 DIAGNOSIS — C787 Secondary malignant neoplasm of liver and intrahepatic bile duct: Secondary | ICD-10-CM

## 2015-10-18 DIAGNOSIS — R140 Abdominal distension (gaseous): Secondary | ICD-10-CM | POA: Diagnosis not present

## 2015-10-18 DIAGNOSIS — Z8249 Family history of ischemic heart disease and other diseases of the circulatory system: Secondary | ICD-10-CM

## 2015-10-18 DIAGNOSIS — Z9221 Personal history of antineoplastic chemotherapy: Secondary | ICD-10-CM

## 2015-10-18 DIAGNOSIS — D630 Anemia in neoplastic disease: Secondary | ICD-10-CM | POA: Diagnosis present

## 2015-10-18 DIAGNOSIS — E785 Hyperlipidemia, unspecified: Secondary | ICD-10-CM | POA: Diagnosis present

## 2015-10-18 DIAGNOSIS — R935 Abnormal findings on diagnostic imaging of other abdominal regions, including retroperitoneum: Secondary | ICD-10-CM | POA: Diagnosis present

## 2015-10-18 DIAGNOSIS — C187 Malignant neoplasm of sigmoid colon: Secondary | ICD-10-CM

## 2015-10-18 DIAGNOSIS — E440 Moderate protein-calorie malnutrition: Secondary | ICD-10-CM | POA: Diagnosis present

## 2015-10-18 DIAGNOSIS — N189 Chronic kidney disease, unspecified: Secondary | ICD-10-CM | POA: Diagnosis present

## 2015-10-18 DIAGNOSIS — D638 Anemia in other chronic diseases classified elsewhere: Secondary | ICD-10-CM | POA: Diagnosis present

## 2015-10-18 DIAGNOSIS — Z95828 Presence of other vascular implants and grafts: Secondary | ICD-10-CM

## 2015-10-18 DIAGNOSIS — J90 Pleural effusion, not elsewhere classified: Secondary | ICD-10-CM | POA: Diagnosis present

## 2015-10-18 DIAGNOSIS — C7800 Secondary malignant neoplasm of unspecified lung: Secondary | ICD-10-CM | POA: Diagnosis present

## 2015-10-18 DIAGNOSIS — R591 Generalized enlarged lymph nodes: Secondary | ICD-10-CM | POA: Diagnosis present

## 2015-10-18 DIAGNOSIS — D709 Neutropenia, unspecified: Secondary | ICD-10-CM | POA: Diagnosis present

## 2015-10-18 DIAGNOSIS — I2602 Saddle embolus of pulmonary artery with acute cor pulmonale: Secondary | ICD-10-CM | POA: Diagnosis present

## 2015-10-18 DIAGNOSIS — K5660 Unspecified intestinal obstruction: Secondary | ICD-10-CM | POA: Diagnosis not present

## 2015-10-18 DIAGNOSIS — I2699 Other pulmonary embolism without acute cor pulmonale: Secondary | ICD-10-CM

## 2015-10-18 DIAGNOSIS — L8990 Pressure ulcer of unspecified site, unspecified stage: Secondary | ICD-10-CM | POA: Diagnosis present

## 2015-10-18 DIAGNOSIS — E860 Dehydration: Secondary | ICD-10-CM | POA: Diagnosis not present

## 2015-10-18 DIAGNOSIS — I130 Hypertensive heart and chronic kidney disease with heart failure and stage 1 through stage 4 chronic kidney disease, or unspecified chronic kidney disease: Secondary | ICD-10-CM | POA: Diagnosis present

## 2015-10-18 DIAGNOSIS — K5669 Other intestinal obstruction: Secondary | ICD-10-CM | POA: Diagnosis present

## 2015-10-18 DIAGNOSIS — Z86711 Personal history of pulmonary embolism: Secondary | ICD-10-CM

## 2015-10-18 DIAGNOSIS — R571 Hypovolemic shock: Secondary | ICD-10-CM | POA: Diagnosis present

## 2015-10-18 DIAGNOSIS — Z6828 Body mass index (BMI) 28.0-28.9, adult: Secondary | ICD-10-CM

## 2015-10-18 DIAGNOSIS — Z7901 Long term (current) use of anticoagulants: Secondary | ICD-10-CM

## 2015-10-18 DIAGNOSIS — F419 Anxiety disorder, unspecified: Secondary | ICD-10-CM | POA: Diagnosis not present

## 2015-10-18 DIAGNOSIS — F411 Generalized anxiety disorder: Secondary | ICD-10-CM | POA: Diagnosis present

## 2015-10-18 DIAGNOSIS — K56609 Unspecified intestinal obstruction, unspecified as to partial versus complete obstruction: Secondary | ICD-10-CM

## 2015-10-18 DIAGNOSIS — E876 Hypokalemia: Secondary | ICD-10-CM | POA: Diagnosis not present

## 2015-10-18 DIAGNOSIS — I5032 Chronic diastolic (congestive) heart failure: Secondary | ICD-10-CM | POA: Diagnosis present

## 2015-10-18 DIAGNOSIS — Z452 Encounter for adjustment and management of vascular access device: Secondary | ICD-10-CM

## 2015-10-18 DIAGNOSIS — R112 Nausea with vomiting, unspecified: Secondary | ICD-10-CM

## 2015-10-18 DIAGNOSIS — E039 Hypothyroidism, unspecified: Secondary | ICD-10-CM | POA: Diagnosis present

## 2015-10-18 DIAGNOSIS — E7220 Disorder of urea cycle metabolism, unspecified: Secondary | ICD-10-CM | POA: Diagnosis present

## 2015-10-18 DIAGNOSIS — R601 Generalized edema: Secondary | ICD-10-CM | POA: Diagnosis not present

## 2015-10-18 DIAGNOSIS — Z79891 Long term (current) use of opiate analgesic: Secondary | ICD-10-CM

## 2015-10-18 DIAGNOSIS — E1122 Type 2 diabetes mellitus with diabetic chronic kidney disease: Secondary | ICD-10-CM | POA: Diagnosis present

## 2015-10-18 DIAGNOSIS — Z79899 Other long term (current) drug therapy: Secondary | ICD-10-CM

## 2015-10-18 DIAGNOSIS — F4329 Adjustment disorder with other symptoms: Secondary | ICD-10-CM | POA: Diagnosis not present

## 2015-10-18 DIAGNOSIS — R627 Adult failure to thrive: Secondary | ICD-10-CM | POA: Diagnosis present

## 2015-10-18 DIAGNOSIS — K56699 Other intestinal obstruction unspecified as to partial versus complete obstruction: Secondary | ICD-10-CM | POA: Diagnosis present

## 2015-10-18 DIAGNOSIS — Z515 Encounter for palliative care: Secondary | ICD-10-CM | POA: Diagnosis present

## 2015-10-18 DIAGNOSIS — Z806 Family history of leukemia: Secondary | ICD-10-CM | POA: Diagnosis not present

## 2015-10-18 DIAGNOSIS — F4320 Adjustment disorder, unspecified: Secondary | ICD-10-CM | POA: Diagnosis present

## 2015-10-18 DIAGNOSIS — K909 Intestinal malabsorption, unspecified: Secondary | ICD-10-CM | POA: Diagnosis not present

## 2015-10-18 DIAGNOSIS — R180 Malignant ascites: Secondary | ICD-10-CM | POA: Diagnosis not present

## 2015-10-18 LAB — COMPREHENSIVE METABOLIC PANEL (CC13)
ALBUMIN: 1.8 g/dL — AB (ref 3.5–5.0)
ALK PHOS: 367 U/L — AB (ref 40–150)
ALT: 14 U/L (ref 0–55)
AST: 26 U/L (ref 5–34)
Anion Gap: 12 mEq/L — ABNORMAL HIGH (ref 3–11)
BUN: 36.8 mg/dL — AB (ref 7.0–26.0)
CO2: 20 mEq/L — ABNORMAL LOW (ref 22–29)
CREATININE: 1.7 mg/dL — AB (ref 0.7–1.3)
Calcium: 8.6 mg/dL (ref 8.4–10.4)
Chloride: 102 mEq/L (ref 98–109)
EGFR: 44 mL/min/{1.73_m2} — ABNORMAL LOW (ref >90)
Glucose: 126 mg/dl (ref 70–140)
Potassium: 3.7 mEq/L (ref 3.5–5.1)
Sodium: 134 mEq/L — ABNORMAL LOW (ref 136–145)
Total Bilirubin: 2.57 mg/dL — ABNORMAL HIGH (ref 0.20–1.20)
Total Protein: 6.2 g/dL — ABNORMAL LOW (ref 6.4–8.3)

## 2015-10-18 LAB — CBC WITH DIFFERENTIAL/PLATELET
BASO%: 0.2 % (ref 0.0–2.0)
Basophils Absolute: 0.1 10*3/uL (ref 0.0–0.1)
EOS%: 0.2 % (ref 0.0–7.0)
Eosinophils Absolute: 0.1 10*3/uL (ref 0.0–0.5)
HEMATOCRIT: 34.1 % — AB (ref 38.4–49.9)
HEMOGLOBIN: 11 g/dL — AB (ref 13.0–17.1)
LYMPH#: 1.9 10*3/uL (ref 0.9–3.3)
LYMPH%: 8.2 % — ABNORMAL LOW (ref 14.0–49.0)
MCH: 28.3 pg (ref 27.2–33.4)
MCHC: 32.3 g/dL (ref 32.0–36.0)
MCV: 87.7 fL (ref 79.3–98.0)
MONO#: 1.9 10*3/uL — AB (ref 0.1–0.9)
MONO%: 8 % (ref 0.0–14.0)
NEUT%: 83.4 % — AB (ref 39.0–75.0)
NEUTROS ABS: 19.4 10*3/uL — AB (ref 1.5–6.5)
NRBC: 0 % (ref 0–0)
Platelets: 402 10*3/uL — ABNORMAL HIGH (ref 140–400)
RBC: 3.89 10*6/uL — ABNORMAL LOW (ref 4.20–5.82)
RDW: 17.6 % — ABNORMAL HIGH (ref 11.0–14.6)
WBC: 23.2 10*3/uL — AB (ref 4.0–10.3)

## 2015-10-18 LAB — I-STAT CG4 LACTIC ACID, ED: LACTIC ACID, VENOUS: 1.15 mmol/L (ref 0.5–2.0)

## 2015-10-18 LAB — C DIFFICILE QUICK SCREEN W PCR REFLEX
C DIFFICILE (CDIFF) INTERP: NEGATIVE
C DIFFICILE (CDIFF) TOXIN: NEGATIVE
C DIFFICLE (CDIFF) ANTIGEN: NEGATIVE

## 2015-10-18 MED ORDER — PIPERACILLIN-TAZOBACTAM 3.375 G IVPB 30 MIN
3.3750 g | INTRAVENOUS | Status: AC
  Administered 2015-10-18: 3.375 g via INTRAVENOUS

## 2015-10-18 MED ORDER — MORPHINE SULFATE (PF) 2 MG/ML IV SOLN
2.0000 mg | INTRAVENOUS | Status: DC | PRN
  Administered 2015-10-20 – 2015-11-01 (×9): 2 mg via INTRAVENOUS
  Filled 2015-10-18 (×9): qty 1

## 2015-10-18 MED ORDER — SODIUM CHLORIDE 0.9 % IV BOLUS (SEPSIS)
1000.0000 mL | Freq: Once | INTRAVENOUS | Status: AC
  Administered 2015-10-18: 1000 mL via INTRAVENOUS

## 2015-10-18 MED ORDER — IOHEXOL 300 MG/ML  SOLN
50.0000 mL | Freq: Once | INTRAMUSCULAR | Status: AC | PRN
  Administered 2015-10-18: 50 mL via ORAL

## 2015-10-18 MED ORDER — PIPERACILLIN-TAZOBACTAM 3.375 G IVPB
3.3750 g | Freq: Once | INTRAVENOUS | Status: DC
Start: 2015-10-18 — End: 2015-10-18
  Filled 2015-10-18: qty 50

## 2015-10-18 MED ORDER — SODIUM CHLORIDE 0.9 % IV SOLN
INTRAVENOUS | Status: DC
  Administered 2015-10-18 – 2015-10-21 (×4): via INTRAVENOUS
  Administered 2015-10-21: 75 mL/h via INTRAVENOUS
  Administered 2015-10-21: 01:00:00 via INTRAVENOUS

## 2015-10-18 MED ORDER — SODIUM CHLORIDE 0.9 % IV SOLN
500.0000 mL | Freq: Once | INTRAVENOUS | Status: AC
  Administered 2015-10-18: 500 mL via INTRAVENOUS

## 2015-10-18 MED ORDER — CETYLPYRIDINIUM CHLORIDE 0.05 % MT LIQD
7.0000 mL | Freq: Two times a day (BID) | OROMUCOSAL | Status: DC
  Administered 2015-10-19 – 2015-11-01 (×19): 7 mL via OROMUCOSAL

## 2015-10-18 MED ORDER — ONDANSETRON HCL 4 MG/2ML IJ SOLN
4.0000 mg | Freq: Four times a day (QID) | INTRAMUSCULAR | Status: DC | PRN
  Administered 2015-10-20 – 2015-10-24 (×4): 4 mg via INTRAVENOUS
  Filled 2015-10-18 (×4): qty 2

## 2015-10-18 MED ORDER — SODIUM CHLORIDE 0.9 % IJ SOLN
10.0000 mL | INTRAMUSCULAR | Status: DC | PRN
  Administered 2015-10-18: 10 mL via INTRAVENOUS
  Filled 2015-10-18: qty 10

## 2015-10-18 MED ORDER — PIPERACILLIN-TAZOBACTAM 3.375 G IVPB
3.3750 g | Freq: Three times a day (TID) | INTRAVENOUS | Status: DC
Start: 2015-10-19 — End: 2015-10-19
  Administered 2015-10-19 (×2): 3.375 g via INTRAVENOUS
  Filled 2015-10-18 (×2): qty 50

## 2015-10-18 NOTE — Patient Instructions (Signed)

## 2015-10-18 NOTE — ED Provider Notes (Signed)
CSN: [id number]     Arrival date & time 10/18/15  1745 History   First MD Initiated Contact with Patient 10/18/15 1758     Chief Complaint  Patient presents with  . Weakness  . Diarrhea     (Consider location/radiation/quality/duration/timing/severity/associated sxs/prior Treatment) HPI  59 year old male presents as a transfer from the [facility] with hypotension. Patient was recently admitted and discharged for a very large pulmonary embolism. He denies any current chest complaints. He has been feeling weak and had increased diarrhea since yesterday. Diarrhea is not a new thing for him, typically takes Lomotil but he has not done this yet. Is having multiple loose watery stools with no blood. Denies abdominal pain or fevers. When he would to the cancer center his blood pressure was 60 systolic. He is given 1 L IV fluids and his blood pressure came up into the 80s and he feels much better. He is still feeling somewhat weak though. They tested for C. difficile and this was negative. CBC and CMP were also evaluated and he has acute kidney injury (Cr 0.6 and now 1.7). Sent to the ER to be admitted.  Past Medical History  Diagnosis Date  . Diabetes mellitus without complication ([place])   . Hypertension   . Metastatic carcinoma involving liver with unknown primary site [facility])   . Hyperlipidemia   . Chronic diastolic congestive heart failure ([place]) 10/09/2015   Past Surgical History  Procedure Laterality Date  . I&d extremity Left 06/19/2013    Procedure: IRRIGATION AND DEBRIDEMENT EXTREMITY;  Surgeon: Newt Minion, MD;  Location: [place];  Service: Orthopedics;  Laterality: Left;  . I&d extremity Left 07/02/2013    Procedure: IRRIGATION AND DEBRIDEMENT EXTREMITY with placement of antibiotic beads and application of wound vac;  Surgeon: Newt Minion, MD;  Location: [place];  Service: Orthopedics;  Laterality: Left;  Irrigation and Debridement Left Foot, Place Beads, Wound VAC  . 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 Ercp N/A 07/14/2015    Procedure: ENDOSCOPIC RETROGRADE CHOLANGIOPANCREATOGRAPHY (ERCP);  Surgeon: Ladene Artist, MD;  Location: [facility] ENDOSCOPY;  Service: Endoscopy;  Laterality: N/A;   Family History  Problem Relation Age of Onset  . Heart failure Mother   . Heart attack Mother   . Leukemia Father   . Colon cancer Neg Hx    Social History  Substance Use Topics  . Smoking status: Never Smoker   . Smokeless tobacco: Never Used  . Alcohol Use: 0.0 oz/week    0 Standard drinks or equivalent per week     Comment: social drinker    Review of Systems  Constitutional: Positive for fatigue. Negative for fever.  Respiratory: Negative for shortness of breath.   Cardiovascular: Positive for leg swelling. Negative for chest pain.  Gastrointestinal: Positive for diarrhea. Negative for nausea, vomiting, abdominal pain and abdominal distention.  Genitourinary: Negative for dysuria.  Neurological: Positive for weakness.  All other systems reviewed and are negative.     Allergies  Review of patient's allergies indicates no known allergies.  Home Medications   Prior to Admission medications   Medication Sig Start Date End Date Taking? Authorizing Provider  levothyroxine (SYNTHROID, LEVOTHROID) 75 MCG tablet Take 75 mcg by mouth daily before breakfast.   Yes Historical Provider, MD  Amino Acids-Protein Hydrolys (FEEDING SUPPLEMENT, PRO-STAT SUGAR FREE 64,)  LIQD Take 30 mLs by mouth 2 (two) times daily.    Historical Provider, MD  antiseptic oral rinse (CPC / CETYLPYRIDINIUM CHLORIDE 0.05%) 0.05 % LIQD solution 7 mLs by Mouth Rinse route 2 (two) times daily. 10/11/15   Robbie Lis, MD  diphenoxylate-atropine (LOMOTIL) 2.5-0.025 MG per tablet Take 1 tablet by mouth 4 (four) times daily as needed for diarrhea or loose stools. 09/27/15    Owens Shark, NP  ergocalciferol (VITAMIN D2) 50000 UNITS capsule Take 50,000 Units by mouth 2 (two) times a week. Wed and Sat    Historical Provider, MD  feeding supplement, ENSURE ENLIVE, (ENSURE ENLIVE) LIQD Take 237 mLs by mouth 2 (two) times daily between meals. 10/11/15   Robbie Lis, MD  furosemide (LASIX) 40 MG tablet Take 1 tablet (40 mg total) by mouth 2 (two) times daily. 10/11/15   Robbie Lis, MD  lidocaine-prilocaine (EMLA) cream Apply small amount of cream over port area 1-2 hours prior to treatment and cover with plastic wrap.  DO NOT RUB IN 07/26/15   Ladell Pier, MD  Multiple Vitamin (MULTIVITAMIN) tablet Take 1 tablet by mouth daily. DECUBIVITE for nutritional support    Historical Provider, MD  ondansetron (ZOFRAN) 8 MG tablet Take 1 tablet (8 mg total) by mouth every 8 (eight) hours as needed for nausea or vomiting. 07/26/15   Ladell Pier, MD  potassium chloride (KLOR-CON) 20 MEQ packet Take 20 mEq by mouth daily. 09/13/15   Owens Shark, NP  prochlorperazine (COMPAZINE) 10 MG tablet TAKE 1 TABLET (10 MG TOTAL) BY MOUTH EVERY 6 (SIX) HOURS AS NEEDED FOR NAUSEA OR VOMITING. 09/23/15   Ladell Pier, MD  Rivaroxaban (XARELTO) 15 MG TABS tablet Take 1 tablet (15 mg total) by mouth 2 (two) times daily with a meal. 10/11/15   Robbie Lis, MD  traMADol (ULTRAM) 50 MG tablet Take 1 tablet (50 mg total) by mouth every 6 (six) hours as needed for moderate pain. 10/11/15   Robbie Lis, MD   BP 91/57 mmHg  Pulse 86  Temp(Src) 97.8 F (36.6 C) (Oral)  Resp 14  SpO2 98% Physical Exam  Constitutional: He is oriented to person, place, and time. He appears well-developed and well-nourished.  HENT:  Head: Normocephalic and atraumatic.  Right Ear: External ear normal.  Left Ear: External ear normal.  Nose: Nose normal.  Eyes: Right eye exhibits no discharge. Left eye exhibits no discharge.  Neck: Neck supple.  Cardiovascular: Normal rate, regular rhythm, normal heart  sounds and intact distal pulses.   Pulmonary/Chest: Effort normal and breath sounds normal.  Abdominal: Soft. He exhibits no distension. There is no tenderness.  Musculoskeletal: He exhibits edema (bilateral lower extremity edema).  Neurological: He is alert and oriented to person, place, and time.  Skin: Skin is warm and dry. There is pallor.  Nursing note and vitals reviewed.   ED Course  Procedures (including critical care time) Labs Review Labs Reviewed  STOOL CULTURE  OVA AND PARASITE EXAMINATION  CULTURE, BLOOD (ROUTINE X 2)  CULTURE, BLOOD (ROUTINE X 2)  I-STAT CG4 LACTIC ACID, ED    Imaging Review No results found. I have personally reviewed and evaluated these images and lab results as part of my medical decision-making.   EKG Interpretation None      MDM   Final diagnoses:  Acute kidney injury ([place])    Labs from earlier in the day at oncologist office have been reviewed. After IV  fluids in the ER his blood pressure is now better with a systolic blood pressure in the low 100s. He clinically feels better. Given his acute kidney injury he will need admission for further fluids and testing. Most likely this is all prerenal from significant diarrhea. With his significant elevated white blood cell count, hospitalist Dr. Dreama Saa requests Zosyn and his CT of his abdomen without contrast for evaluation of colitis. Admit to stepdown.    Sherwood Gambler, MD 10/18/15 2022

## 2015-10-18 NOTE — ED Notes (Signed)
Pt presents from [facility] c/o weakness and diarrhea all day.  Agricultural consultant from [employer] reports approx 10 episodes of diarrhea since his arrival earlier today.  C-Diff was negative.  Pt was also hypotensive with pressure ~60/40, after 1L fluid bolus it increased to ~90/50.  Pt is undergoing treatment for colon CA, right port is already accessed.  Blood cultures have not been drawn.

## 2015-10-18 NOTE — Progress Notes (Signed)
ANTIBIOTIC CONSULT NOTE - INITIAL  Pharmacy Consult for zosyn Indication: rule out sepsis  No Known Allergies  Patient Measurements: weight 106 kg, height 75 inches    Vital Signs: Temp: 97.8 F (36.6 C) (10/18 1756) Temp Source: Oral (10/18 1756) BP: 103/63 mmHg (10/18 1940) Pulse Rate: 82 (10/18 1940) Intake/Output from previous day:   Intake/Output from this shift:    Labs:  Recent Labs  10/18/15 0842 10/18/15 0843  WBC 23.2*  --   HGB 11.0*  --   PLT 402*  --   CREATININE  --  1.7*   Estimated Creatinine Clearance: 60.8 mL/min (by C-G formula based on Cr of 1.7). No results for input(s): VANCOTROUGH, VANCOPEAK, VANCORANDOM, GENTTROUGH, GENTPEAK, GENTRANDOM, TOBRATROUGH, TOBRAPEAK, TOBRARND, AMIKACINPEAK, AMIKACINTROU, AMIKACIN in the last 72 hours.   Microbiology: Recent Results (from the past 720 hour(s))  Blood Culture (routine x 2)     Status: None   Collection Time: 10/02/15 10:50 AM  Result Value Ref Range Status   Specimen Description BLOOD CHEST PORT  3 ML IN YELLOW BOTTLE  Final   Special Requests NONE  Final   Culture   Final    NO GROWTH 5 DAYS Performed at [facility]    Report Status 10/07/2015 FINAL  Final  Blood Culture (routine x 2)     Status: None   Collection Time: 10/02/15 11:00 AM  Result Value Ref Range Status   Specimen Description BLOOD LEFT HAND  1 ML IN YELLOW BOTTLE  Final   Special Requests NONE  Final   Culture   Final    NO GROWTH 5 DAYS Performed at [facility]    Report Status 10/07/2015 FINAL  Final  Urine culture     Status: None   Collection Time: 10/02/15  1:45 PM  Result Value Ref Range Status   Specimen Description URINE, CLEAN CATCH  Final   Special Requests Immunocompromised  Final   Culture   Final    NO GROWTH 1 DAY Performed at [facility]    Report Status 10/03/2015 FINAL  Final  MRSA PCR Screening     Status: None   Collection Time: 10/02/15  3:50 PM  Result Value Ref Range  Status   MRSA by PCR NEGATIVE NEGATIVE Final    Comment:        The GeneXpert MRSA Assay (FDA approved for NASAL specimens only), is one component of a comprehensive MRSA colonization surveillance program. It is not intended to diagnose MRSA infection nor to guide or monitor treatment for MRSA infections.   C difficile quick scan w PCR reflex     Status: None   Collection Time: 10/18/15 11:32 AM  Result Value Ref Range Status   C Diff antigen NEGATIVE NEGATIVE Final   C Diff toxin NEGATIVE NEGATIVE Final   C Diff interpretation Negative for toxigenic C. difficile  Final    Medical History: Past Medical History  Diagnosis Date  . Diabetes mellitus without complication ([place])   . Hypertension   . Metastatic carcinoma involving liver with unknown primary site [facility])   . Hyperlipidemia   . Chronic diastolic congestive heart failure ([place]) 10/09/2015    Assessment: Patient's a 59 y.o M with colon cancer currently undergoing chemotherapy treatment who presented to [facility] ED on 10/18 from the cancer center d/t to hypotension and worsening of diarrhea.  In the ED, he was found to have elevated WBC.  To start zosyn for broad empiric coverage.  -  afeb, wbc 23.2, scr 1.7 (crcl~47 N) - LA on 10/18 2.23  10/18 zosyn>>  10/18 bcx x2:  10/18 cdiff antigen and toxin (-)  Plan:  - zosyn 3.375 gm IV x1 over 30 minutes, then 3.375 gm IV q8h over 4 hours  Marquese Burkland P 10/18/2015,8:37 PM

## 2015-10-18 NOTE — ED Notes (Signed)
Pt requesting medication for diarrhea; MD notified.

## 2015-10-18 NOTE — Patient Instructions (Signed)

## 2015-10-18 NOTE — Progress Notes (Signed)
BP after 500 ml of NS is 82/60 manually. Dr. Benay Spice notified and order received from MD to give pt an addition 500 ml of NS.

## 2015-10-18 NOTE — Progress Notes (Signed)
Nutrition follow-up completed with patient during chemotherapy for metastatic colon cancer. Patient had hospital admission and then was transferred to [facility] skilled nursing facility. Noted patient has a stage II pressure ulcer on his coccyx. Paracentesis on October 5 removed 6 L of fluid. Weight documented on October 12 was 233.2 pounds.  Nutrition diagnosis: Food and nutrition related knowledge deficit continues.  Intervention:  Patient educated to continue high-protein foods. Patient encouraged to continue glycemic control for improved healing. Encouraged patient to consume oral nutrition supplements for additional calories and protein. Teach back method was used.  Monitoring, evaluation, goals: Patient will tolerate adequate calories with increased protein to promote healing.  Next visit: No follow-up has been scheduled as patient will see RD nursing home.  I will be available to patient as needed.  **Disclaimer: This note was dictated with voice recognition software. Similar sounding words can inadvertently be transcribed and this note may contain transcription errors which may not have been corrected upon publication of note.**

## 2015-10-18 NOTE — ED Notes (Signed)
Attempted report, nurse occupied with another pt and asked for return call.

## 2015-10-18 NOTE — ED Notes (Signed)
Attempted report again, RN is in a pt's room and asked to call back in 5-10 mins.

## 2015-10-18 NOTE — Progress Notes (Signed)
[place] OFFICE PROGRESS NOTE   Diagnosis: Colon cancer  INTERVAL HISTORY:   Jaime [name] was discharged from the hospital on 10/11/2015 after an admission with bilateral DVTs and pulmonary embolism. He is now in a skilled nursing facility. He reports participating in daily physical therapy for the last 4 days. He developed diarrhea beginning on 10/16/2015. This has progressed. He reports 4 diarrhea stools already this morning. He has developed anorexia over the past few days. No other complaint.  Objective:  Vital signs in last 24 hours:  Blood pressure 60/36, pulse 102, resp. rate 18, height 6\' 3"  (1.905 m), SpO2 100 %.    HEENT: No thrush or ulcers Resp: Lungs clear bilaterally Cardio: Regular rate and rhythm, tachycardia GI: Mildly distended, nontender, no mass Vascular: Pitting edema throughout the left greater than right leg Neuro: 3-4/5 strength in the legs bilaterally     Portacath/PICC-without erythema  Lab Results:  Lab Results  Component Value Date   WBC 23.2* 10/18/2015   HGB 11.0* 10/18/2015   HCT 34.1* 10/18/2015   MCV 87.7 10/18/2015   PLT 402* 10/18/2015   NEUTROABS 19.4* 10/18/2015      Lab Results  Component Value Date   CEA 4294.0* 10/11/2015    Imaging:  No results found.  Medications: I have reviewed the patient's current medications.  Assessment/Plan: 1. 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, status post a colonoscopy 07/21/2015 confirming a sigmoid colon mass  Staging CT scans 07/13/2015 and 07/14/2015 confirmed extensive liver metastases, lung nodules, a right pleural effusion, and porta hepatis lymphadenopathy  Cycle 1 FOLFOX (chemotherapy doses reduced and bolus 5-FU eliminated) 08/02/2015  Cycle 2 FOLFOX (chemotherapy doses reduced and bolus 5-FU eliminated) 08/16/2015  Cycle 3 FOLFOX (Avastin added, bolus 5-FU introduced, and infusional 5-FU dose escalated) 08/30/2015  Cycle 4 FOLFOX plus Avastin  09/13/2015  Cycle 5 held on 09/27/2015 due to mild neutropenia, recent diarrhea.  CT of the chest 10/02/2015, compared to a CT from 07/13/2015-no significant change in lung nodules or liver metastases  Diagnostic/therapeutic paracentesis 10/05/2015-negative cytology  CEA lower on 10/11/2015  2. Obstructive jaundice, likely secondary to porta hepatis lymphadenopathy, status post placement of a bile duct stent on 07/14/2015  3. Microcytic anemia secondary to the colon mass-improved  4. Diabetes  5. Failure to thrive secondary to metastatic colon cancer  6. Port-A-Cath placement 08/01/2015  7. Nausea following cycle 1 and cycle 2 FOLFOX-emend added with cycle 3  8. Diarrhea following cycle 4 FOLFOX/Avastin.  9. Neutropenia following cycle 4 FOLFOX/Avastin. Neulasta to be added with cycle 5.  10. Admission 10/02/2015 with bilateral DVTs and pulmonary emboli-maintained on Xarelto 10/07/2015  11.  Diarrhea-he is at high risk for C. difficile colitis, we will obtain a C. difficile study today  12.  Hypotension-likely secondary to dehydration   Disposition:  Jaime [name] has metastatic colon cancer. He currently resides in a rehabilitation facility to undergo physical therapy. He presents today with diarrhea and hypotension. I suspect he is dehydrated. We will check the stool for C. difficile and administer IV fluids. If his clinical status improves he will be discharged to the skilled nursing facility. If the blood pressure does not improve significantly and he continues to have frequent diarrhea we will arrange for hospital admission.    Jaime Coder, MD  10/18/2015  2:20 PM   Addendum: I assessed Jaime [name] again at approximately 4 PM. He continues to have frequent diarrhea. He is more alert and the hypotension has partially  improved. A stool sample has been submitted for C. difficile analysis. We will ask the hospitalist service to admit him for further  evaluation and management of the diarrhea and hypotension.  I discussed CPR and ACLS issues with Jaime [name] and his wife. He has not decided on a CODE STATUS. We will continue this discussion wall he is in the hospital.  Julieanne Manson, M.D.

## 2015-10-18 NOTE — H&P (Signed)
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 He said his diarrhea has been getting worse since last night with profuse watery diarrhea that has not been associated with any abdominal pain, hematochezia, tenesmus, nausea or vomiting. But he reported loss of appetite/anorexia for the last few days. No fever or chills. No other complaints. He was seen today by Dr.Sherril (oncology) and was found to be hypotensive with poor response to fluid in clinic. He was sent to the ER for further workup. C.diff PCR was neg.   Review of Systems:  CONSTITUTIONAL:    No fever or chills. Eyes:  No visual changes.  No eye pain.  No eye discharge.   ENT:    No epistaxis.  No sinus pain.  No sore throat.  No ear pain.  No congestion. RESPIRATORY:  No cough.  No wheeze.  No hemoptysis.  No shortness of breath. CARDIOVASCULAR:  No chest pains.  No palpitations. GASTROINTESTINAL:  No abdominal pain.  No nausea or vomiting.  +diarrhea .  No hematemesis.  No hematochezia.  No melena. GENITOURINARY:  No urgency.  No frequency.  No dysuria.  No hematuria.  No obstructive symptoms.  No discharge.  No pain.  No significant abnormal bleeding. MUSCULOSKELETAL:  No musculoskeletal pain.  No joint swelling.  No arthritis. NEUROLOGICAL:  No confusion.  No weakness. No headache. No seizure. PSYCHIATRIC:  No depression. No anxiety. No suicidal ideation. SKIN:  No rashes.  No lesions.  No wounds. ENDOCRINE:  No unexplained weight loss.  No polydipsia.  No polyuria.  No polyphagia. HEMATOLOGIC:  No anemia.  No purpura.  No petechiae.  No  bleeding.  ALLERGIC AND IMMUNOLOGIC:  No pruritus.  No swelling Other:  Past Medical and Surgical History:   Past Medical History  Diagnosis Date  . Diabetes mellitus without complication ([place])   . Hypertension   . Metastatic carcinoma involving liver with unknown primary site [facility])   . Hyperlipidemia   . Chronic diastolic congestive heart failure ([place]) 10/09/2015   Past Surgical History  Procedure Laterality Date  . I&d extremity Left 06/19/2013    Procedure: IRRIGATION AND DEBRIDEMENT EXTREMITY;  Surgeon: Newt Minion, MD;  Location: [place];  Service: Orthopedics;  Laterality: Left;  . I&d extremity Left 07/02/2013    Procedure: IRRIGATION AND DEBRIDEMENT EXTREMITY with placement of antibiotic beads and application of wound vac;  Surgeon: Newt Minion, MD;  Location: [place];  Service: Orthopedics;  Laterality: Left;  Irrigation and Debridement Left Foot, Place Beads, Wound VAC  . Amputation Left 01/08/2014    Procedure: AMPUTATION RAY;  Surgeon: Newt Minion, MD;  Location: [place];  Service: Orthopedics;  Laterality: Left;  Left Foot 5th Ray Amputation  . Ercp N/A 07/14/2015    Procedure: ENDOSCOPIC RETROGRADE CHOLANGIOPANCREATOGRAPHY (ERCP);  Surgeon: Ladene Artist, MD;  Location: [facility] END[facility];  Service: Endoscopy;  Laterality: N/A;    Social History:   reports that he has never smoked. He has never used smokeless tobacco. He reports that he drinks alcohol. He reports that he does not use illicit drugs.  No Known Allergies  Family History  Problem Relation Age of Onset  . Heart failure Mother   . Heart attack Mother   . Leukemia Father   . Colon cancer Neg Hx       Prior to Admission medications   Medication Sig Start Date End Date Taking? Authorizing Provider  levothyroxine (SYNTHROID, LEVOTHROID) 75 MCG tablet Take 75 mcg by mouth daily before breakfast.   Yes Historical Provider, MD  Amino Acids-Protein Hydrolys (FEEDING SUPPLEMENT, PRO-STAT SUGAR FREE 64,) LIQD Take 30  mLs by mouth 2 (two) times daily.    Historical Provider, MD  antiseptic oral rinse (CPC / CETYLPYRIDINIUM CHLORIDE 0.05%) 0.05 % LIQD solution 7 mLs by Mouth Rinse route 2 (two) times daily. 10/11/15   Robbie Lis, MD  diphenoxylate-atropine (LOMOTIL) 2.5-0.025 MG per tablet Take 1 tablet by mouth 4 (four) times daily as needed for diarrhea or loose stools. 09/27/15   Owens Shark, NP  ergocalciferol (VITAMIN D2) 50000 UNITS capsule Take 50,000 Units by mouth 2 (two) times a week. Wed and Sat    Historical Provider, MD  feeding supplement, ENSURE ENLIVE, (ENSURE ENLIVE) LIQD Take 237 mLs by mouth 2 (two) times daily between meals. 10/11/15   Robbie Lis, MD  furosemide (LASIX) 40 MG tablet Take 1 tablet (40 mg total) by mouth 2 (two) times daily. 10/11/15   Robbie Lis, MD  lidocaine-prilocaine (EMLA) cream Apply small amount of cream over port area 1-2 hours prior to treatment and cover with plastic wrap.  DO NOT RUB IN 07/26/15   Ladell Pier, MD  Multiple Vitamin (MULTIVITAMIN) tablet Take 1 tablet by mouth daily. DECUBIVITE for nutritional support    Historical Provider, MD  ondansetron (ZOFRAN) 8 MG tablet Take 1 tablet (8 mg total) by mouth every 8 (eight) hours as needed for nausea or vomiting. 07/26/15   Ladell Pier, MD  potassium chloride (KLOR-CON) 20 MEQ packet Take 20 mEq by mouth daily. 09/13/15   Owens Shark, NP  prochlorperazine (COMPAZINE) 10 MG tablet TAKE 1 TABLET (10 MG TOTAL) BY MOUTH EVERY 6 (SIX) HOURS AS NEEDED FOR NAUSEA OR VOMITING. 09/23/15   Ladell Pier, MD  Rivaroxaban (XARELTO) 15 MG TABS tablet Take 1 tablet (15 mg total) by mouth 2 (two) times daily with a meal. 10/11/15   Robbie Lis, MD  traMADol (ULTRAM) 50 MG tablet Take 1 tablet (50 mg total) by mouth every 6 (six) hours as needed for moderate pain. 10/11/15   Robbie Lis, MD    Physical Exam: BP 103/63 mmHg  Pulse 82  Temp(Src) 97.8 F (36.6 C) (Oral)  Resp 19  SpO2 99%  GENERAL : Well  developed, well nourished, alert and cooperative, and appears to be in no acute distress. HEAD: normocephalic. EYES: PERRL, EOMI. EARS:hearing grossly intact. NOSE: No nasal discharge. THROAT: Oral cavity and pharynx normal. NECK: Neck supple CARDIAC: Normal S1 and S2. No S3, S4 or murmurs. Rhythm is regular. + peripheral edema. LUNGS: Clear to auscultation ABDOMEN: Hyporeactive bowel sounds. Soft, distended, nontender. No guarding or rebound.  EXTREMITIES: No significant deformity or joint abnormality. + bilateral LE edema.  NEUROLOGICAL: The mental examination revealed the patient was oriented to person, place, and time.CN II-XII intact. Strength and sensation symmetric and intact throughout.  SKIN: bruises on arms, LE edema, Bed sore ( refused ; examined by nurse; not oozing). PSYCHIATRIC:  The patient was able to demonstrate good judgement and reason, without hallucinations, abnormal affect or abnormal behaviors during the examination.  Patient is not suicidal.          Labs on Admission:  Reviewed.   Radiological Exams on Admission: No results found.   Assessment/Plan  Hypotension :  -  Differential definitely include sepsis due to intraabdominal etiology: leukocytosis/neutrophilia, acute onset.      will cover with Zosyn. Will not do Flagyl as C.diff PCR was neg but will repeat it. Will also check CMV PCR, Norovirus      PCR in stool , Ova and Parasite including Giardia/Cryptospordium.  - Could be due to severe dehydration due to diarrhea.  - Responded well to IVF 2 L, will continue 150 cc/hr  Diarrhea:  - Ddx include : colitis ( workup as above), will check CT scan.  - Differential is : due to chemo , worsened by starting lasix.  - hold imodium for now.  - GI consult in am.  - Keep NPO for now  Colon cancer stage IV: - s/p Chemo - Onc consult in am  DVT/PE: Continue Xarelto in am.   AKI:  likely prerenal Continue IVF.  DVT prophylaxis: SCDs, on Xarelto.  Code  Status: Discussed with wife/patient. They want full code for now awaiting further discussion with Onc.  Disposition Plan: Pam Drown M.D [facility]

## 2015-10-18 NOTE — Progress Notes (Signed)
Pt discharged to ED with Ubaldo Glassing RN and Erline Levine RN, tx via wheelchair to ED. Report given to [facility] RN at bedside. Wife accompanied also with pt's personal wheelchair.

## 2015-10-18 NOTE — Progress Notes (Signed)
Patient has recently been admitted and discharged from the hospital from 10/02 to 10/12 with a PE. Patient then transferred to [facility] for rehab.  Patient now presents to Ed with acute kidney injury rule out sepsis.

## 2015-10-19 ENCOUNTER — Encounter: Payer: Self-pay | Admitting: *Deleted

## 2015-10-19 DIAGNOSIS — R571 Hypovolemic shock: Secondary | ICD-10-CM | POA: Diagnosis present

## 2015-10-19 DIAGNOSIS — E039 Hypothyroidism, unspecified: Secondary | ICD-10-CM | POA: Diagnosis present

## 2015-10-19 DIAGNOSIS — C187 Malignant neoplasm of sigmoid colon: Principal | ICD-10-CM

## 2015-10-19 DIAGNOSIS — I959 Hypotension, unspecified: Secondary | ICD-10-CM

## 2015-10-19 DIAGNOSIS — R197 Diarrhea, unspecified: Secondary | ICD-10-CM | POA: Diagnosis present

## 2015-10-19 DIAGNOSIS — E860 Dehydration: Secondary | ICD-10-CM

## 2015-10-19 DIAGNOSIS — N179 Acute kidney failure, unspecified: Secondary | ICD-10-CM | POA: Insufficient documentation

## 2015-10-19 DIAGNOSIS — J90 Pleural effusion, not elsewhere classified: Secondary | ICD-10-CM

## 2015-10-19 DIAGNOSIS — E440 Moderate protein-calorie malnutrition: Secondary | ICD-10-CM

## 2015-10-19 DIAGNOSIS — I5032 Chronic diastolic (congestive) heart failure: Secondary | ICD-10-CM

## 2015-10-19 DIAGNOSIS — C787 Secondary malignant neoplasm of liver and intrahepatic bile duct: Secondary | ICD-10-CM

## 2015-10-19 DIAGNOSIS — I480 Paroxysmal atrial fibrillation: Secondary | ICD-10-CM

## 2015-10-19 LAB — GLUCOSE, CAPILLARY
GLUCOSE-CAPILLARY: 114 mg/dL — AB (ref 65–99)
GLUCOSE-CAPILLARY: 139 mg/dL — AB (ref 65–99)

## 2015-10-19 LAB — CBC WITH DIFFERENTIAL/PLATELET
BASOS ABS: 0 10*3/uL (ref 0.0–0.1)
BASOS PCT: 0 %
EOS ABS: 0.2 10*3/uL (ref 0.0–0.7)
EOS PCT: 1 %
HCT: 31.9 % — ABNORMAL LOW (ref 39.0–52.0)
Hemoglobin: 10.2 g/dL — ABNORMAL LOW (ref 13.0–17.0)
Lymphocytes Relative: 8 %
Lymphs Abs: 1.2 10*3/uL (ref 0.7–4.0)
MCH: 28.5 pg (ref 26.0–34.0)
MCHC: 32 g/dL (ref 30.0–36.0)
MCV: 89.1 fL (ref 78.0–100.0)
Monocytes Absolute: 1.1 10*3/uL — ABNORMAL HIGH (ref 0.1–1.0)
Monocytes Relative: 7 %
Neutro Abs: 13.6 10*3/uL — ABNORMAL HIGH (ref 1.7–7.7)
Neutrophils Relative %: 84 %
PLATELETS: 335 10*3/uL (ref 150–400)
RBC: 3.58 MIL/uL — AB (ref 4.22–5.81)
RDW: 17.6 % — ABNORMAL HIGH (ref 11.5–15.5)
WBC: 16.1 10*3/uL — AB (ref 4.0–10.5)

## 2015-10-19 LAB — COMPREHENSIVE METABOLIC PANEL
ALT: 13 U/L — AB (ref 17–63)
AST: 22 U/L (ref 15–41)
Albumin: 1.8 g/dL — ABNORMAL LOW (ref 3.5–5.0)
Alkaline Phosphatase: 293 U/L — ABNORMAL HIGH (ref 38–126)
Anion gap: 11 (ref 5–15)
BILIRUBIN TOTAL: 2 mg/dL — AB (ref 0.3–1.2)
BUN: 40 mg/dL — AB (ref 6–20)
CO2: 17 mmol/L — ABNORMAL LOW (ref 22–32)
CREATININE: 1.69 mg/dL — AB (ref 0.61–1.24)
Calcium: 8 mg/dL — ABNORMAL LOW (ref 8.9–10.3)
Chloride: 105 mmol/L (ref 101–111)
GFR calc Af Amer: 49 mL/min — ABNORMAL LOW (ref >60)
GFR, EST NON AFRICAN AMERICAN: 42 mL/min — AB (ref >60)
Glucose, Bld: 116 mg/dL — ABNORMAL HIGH (ref 65–99)
POTASSIUM: 3.1 mmol/L — AB (ref 3.5–5.1)
Sodium: 133 mmol/L — ABNORMAL LOW (ref 135–145)
TOTAL PROTEIN: 5.8 g/dL — AB (ref 6.5–8.1)

## 2015-10-19 LAB — URINALYSIS, ROUTINE W REFLEX MICROSCOPIC
Glucose, UA: NEGATIVE mg/dL
Hgb urine dipstick: NEGATIVE
KETONES UR: NEGATIVE mg/dL
NITRITE: POSITIVE — AB
Protein, ur: NEGATIVE mg/dL
Specific Gravity, Urine: 1.025 (ref 1.005–1.030)
UROBILINOGEN UA: 1 mg/dL (ref 0.0–1.0)
pH: 5 (ref 5.0–8.0)

## 2015-10-19 LAB — OVA AND PARASITE EXAMINATION: OVA AND PARASITES: NONE SEEN

## 2015-10-19 LAB — URINE MICROSCOPIC-ADD ON

## 2015-10-19 LAB — MRSA PCR SCREENING: MRSA BY PCR: NEGATIVE

## 2015-10-19 MED ORDER — LEVOTHYROXINE SODIUM 75 MCG PO TABS
75.0000 ug | ORAL_TABLET | Freq: Every day | ORAL | Status: DC
  Administered 2015-10-19 – 2015-10-23 (×5): 75 ug via ORAL
  Filled 2015-10-19 (×6): qty 1

## 2015-10-19 MED ORDER — RIVAROXABAN 20 MG PO TABS: 20.0000 mg | ORAL_TABLET | Freq: Every day | ORAL | Status: DC

## 2015-10-19 MED ORDER — LOPERAMIDE HCL 2 MG PO CAPS
2.0000 mg | ORAL_CAPSULE | Freq: Three times a day (TID) | ORAL | Status: DC
Start: 2015-10-19 — End: 2015-10-21
  Administered 2015-10-19 – 2015-10-21 (×8): 2 mg via ORAL
  Filled 2015-10-19 (×10): qty 1

## 2015-10-19 MED ORDER — LOPERAMIDE HCL 2 MG PO CAPS
2.0000 mg | ORAL_CAPSULE | ORAL | Status: DC | PRN
  Filled 2015-10-19: qty 1

## 2015-10-19 MED ORDER — RIVAROXABAN 15 MG PO TABS
15.0000 mg | ORAL_TABLET | Freq: Two times a day (BID) | ORAL | Status: DC
  Administered 2015-10-19 – 2015-10-23 (×10): 15 mg via ORAL
  Filled 2015-10-19 (×12): qty 1

## 2015-10-19 NOTE — Progress Notes (Signed)
Late entry addendum: Called the emergency department charge nurse on behalf of Dr. Benay Spice.  Brief history report were given to the emergency department charge nurse prior to the patient being transported to the emergency department.  The cancer Center nurse.

## 2015-10-19 NOTE — Progress Notes (Signed)
IP PROGRESS NOTE  Subjective:   He reports feeling better today. The diarrhea has slowed.  Objective: Vital signs in last 24 hours: Blood pressure 109/74, pulse 89, temperature 97.4 F (36.3 C), temperature source Oral, resp. rate 17, height 6\' 4"  (1.93 m), weight 240 lb 11.9 oz (109.2 kg), SpO2 100 %.  Intake/Output from previous day: 10/18 0701 - 10/19 0700 In: 2297 [P.O.:240; I.V.:1200; IV Piggyback:50] Out: 850 [Urine:200; Stool:650]  Physical Exam:  HEENT: No thrush Lungs: Clear anteriorly Cardiac: Regular rate and rhythm Abdomen: Markedly distended, no hepatomegaly Extremities: Pitting edema throughout the left greater than right leg   Portacath/PICC-without erythema  Lab Results:  Recent Labs  10/18/15 0842 10/19/15 0355  WBC 23.2* 16.1*  HGB 11.0* 10.2*  HCT 34.1* 31.9*  PLT 402* 335    BMET  Recent Labs  10/18/15 0843 10/19/15 0355  NA 134* 133*  K 3.7 3.1*  CL  --  105  CO2 20* 17*  GLUCOSE 126 116*  BUN 36.8* 40*  CREATININE 1.7* 1.69*  CALCIUM 8.6 8.0*    Studies/Results: Ct Abdomen Pelvis Wo Contrast  10/18/2015  CLINICAL DATA:  Acute onset of nausea, vomiting and diarrhea. Recently diagnosed pulmonary embolus. Status post chemotherapy for colorectal adenocarcinoma. Initial encounter. EXAM: CT ABDOMEN AND PELVIS WITHOUT CONTRAST TECHNIQUE: Multidetector CT imaging of the abdomen and pelvis was performed following the standard protocol without IV contrast. COMPARISON:  CT of the abdomen and pelvis performed 07/13/2015 FINDINGS: Scattered nodules of varying size are noted at the lung bases, compatible with metastatic disease. Trace bilateral pleural effusions are seen, right greater than left. Extensive masses are again noted throughout the liver, measuring up to 14.6 cm in size. These appear mildly worsened from the prior study. Minimally decreased attenuation at the inferior aspect of the spleen may also reflect metastatic disease. Cholelithiasis  is noted; vicarious contrast excretion is seen in the gallbladder. The gallbladder is not well assessed due to surrounding ascites. Moderate to large volume ascites is noted throughout the abdomen and pelvis. This is increased from the prior study. There is increasing diffuse omental edema, with slight nodularity, raising question for mild omental caking. The pancreas is unremarkable in appearance. A 1.9 cm nodule is noted at the right adrenal gland. The left adrenal gland is unremarkable in appearance. The kidneys are unremarkable in appearance. There is no evidence of hydronephrosis. No renal or ureteral stones are seen. Nonspecific perinephric stranding is noted bilaterally. There is distention of the cecum to 10.5 cm; the colon is largely filled with fluid. There is mildly irregular wall thickening along the descending colon, and a persistent 5.2 x 3.3 cm mass is noted at the mid sigmoid colon. Findings likely reflect some degree of dysmotility. The small bowel is unremarkable in appearance. The stomach is within normal limits. No acute vascular abnormalities are seen. Mild scattered calcification is noted along the abdominal aorta and its branches. The bladder is mildly distended and grossly unremarkable. The prostate remains normal in size. No inguinal lymphadenopathy is seen. Diffuse soft tissue edema about the abdomen and pelvis likely reflects mild anasarca. No acute osseous abnormalities are identified. IMPRESSION: 1. Distention of the cecum to 10.5 cm. The colon is largely filled with fluid. Mildly irregular wall thickening along the descending colon, and persistent 5.2 x 3.3 cm mass at the mid sigmoid colon. Bowel distention likely reflects some degree of dysmotility. 2. Worsening moderate to large volume ascites within the abdomen and pelvis. Increasing diffuse omental edema, with slight  nodularity, raising question for mild omental caking. 3. Extensive masses throughout the liver, measuring up to 14.6  cm in size, appear mildly worsened from the prior study. Minimally decreased attenuation at the inferior aspect of the spleen may also reflect metastatic disease. 4. 1.9 cm right adrenal nodule noted; this has decreased mildly in size. 5. Scattered nodules of varying size at the lung bases, more prominent than on the prior study and compatible with metastatic disease. Trace bilateral pleural effusions, right greater than left. 6. Cholelithiasis; gallbladder otherwise grossly unremarkable. 7. Mild scattered calcification along the abdominal aorta and its branches. 8. Diffuse soft tissue edema about the abdomen and pelvis likely reflects mild anasarca. Electronically Signed   By: Garald Balding M.D.   On: 10/18/2015 22:50    Medications: I have reviewed the patient's current medications.  Assessment/Plan: 1. 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, status post a colonoscopy 07/21/2015 confirming a sigmoid colon mass  Staging CT scans 07/13/2015 and 07/14/2015 confirmed extensive liver metastases, lung nodules, a right pleural effusion, and porta hepatis lymphadenopathy  Cycle 1 FOLFOX (chemotherapy doses reduced and bolus 5-FU eliminated) 08/02/2015  Cycle 2 FOLFOX (chemotherapy doses reduced and bolus 5-FU eliminated) 08/16/2015  Cycle 3 FOLFOX (Avastin added, bolus 5-FU introduced, and infusional 5-FU dose escalated) 08/30/2015  Cycle 4 FOLFOX plus Avastin 09/13/2015  Cycle 5 held on 09/27/2015 due to mild neutropenia, recent diarrhea.  CT of the chest 10/02/2015, compared to a CT from 07/13/2015-no significant change in lung nodules or liver metastases  Diagnostic/therapeutic paracentesis 10/05/2015-negative cytology  CEA lower on 10/11/2015  CT 10/18/2015 with mild progression of liver metastases and lung lesions  2. Obstructive jaundice, likely secondary to porta hepatis lymphadenopathy, status post placement of a bile duct stent on 07/14/2015  3. Microcytic anemia secondary  to the colon mass-improved  4. Diabetes  5. Failure to thrive secondary to metastatic colon cancer  6. Port-A-Cath placement 08/01/2015  7. Nausea following cycle 1 and cycle 2 FOLFOX-emend added with cycle 3  8. Diarrhea following cycle 4 FOLFOX/Avastin.  9. Neutropenia following cycle 4 FOLFOX/Avastin. Neulasta to be added with cycle 5.  10. Admission 10/02/2015 with bilateral DVTs and pulmonary emboli-maintained on Xarelto 10/07/2015  11. Diarrhea-C. difficile negative, potentially related to the colon tumor  12. Hypotension-likely secondary to dehydration, improved  He was admitted yesterday with hypotension secondary to dehydration. The dehydration was likely related to diarrhea while taking furosemide. He appears improved today.  Mr. Rho has advanced metastatic colon cancer. He has completed 4 cycles of systemic therapy, but has not been able to undergo treatment for the past month secondary to hospitalization with pulmonary emboli and now diarrhea. He will not be a candidate for further chemotherapy unless his performance status improves. His wife is not present this morning. I plan to discuss comfort/hospice care with Mr. Louque and his wife tomorrow.  Recommendations: 1. Continue supportive care, decrease IV fluids if diarrhea stops 2. Increase diet and ambulation as tolerated 3. Antidiarrhea medications 4. I will discuss goals of care with Mr. Sabo on 10/20/2015    LOS: 1 day   Spurgeon Gancarz  10/19/2015, 12:58 PM

## 2015-10-19 NOTE — Progress Notes (Signed)
Initial Nutrition Assessment  DOCUMENTATION CODES:   Not applicable  INTERVENTION:  - Will order supplements per pt preference with diet advancement - RD will continue to monitor for needs  NUTRITION DIAGNOSIS:   Increased nutrient needs related to catabolic illness, cancer and cancer related treatments as evidenced by estimated needs.  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 He said his diarrhea has been getting worse since last night with profuse watery diarrhea that has not been associated with any abdominal pain, hematochezia, tenesmus, nausea or vomiting. But he reported loss of appetite/anorexia for the last few days. No fever or chills. No other complaints. He was seen today by Dr.Sherril (oncology) and was found to be hypotensive with poor response to fluid in clinic. He was sent to the ER for further workup. C.diff PCR was neg.   Pt seen for MST. BMI indicates overweight status. Pt has been NPO since admission; will monitor for diet advancement and order supplements per pt preference at that time. Pt states that since yesterday his appetite has been fluctuating. He denies abdominal pain or nausea yesterday or today.  Physical assessment unable to be done as RN and tech in the room to take pt off of bedpan. Unable to state malnutrition at this time although pt may meet criteria.  Pt seen by [place] RD yesterday in the chemo infusion room. Per her note, pt had paracentesis 10/05/15 with 6 L removed and weight 10/12/15 was 233 lbs which shows 7 lb weight gain in 7  days. Pt was educated on high-protein foods at that time and for good glycemic control for wound healing. Per chart review, pt has lost 18 lbs (7% body weight) in the past 1 month; this is likely partly fluid related.   Not able to meet needs while NPO. Medications reviewed. Labs reviewed; Na: 133 mmol/L, K: 3.1 mmol/L, BUN/creatinine elevated, Ca: 8 mg/dL, GFR: 42.   Diet Order:  Diet NPO time specified Except for: Sips with Meds  Skin:  Wound (see comment) (Stage 2 sacral pressure ulcer)  Last BM:  10/18  Height:   Ht Readings from Last 1 Encounters:  10/18/15 6\' 4"  (1.93 m)    Weight:   Wt Readings from Last 1 Encounters:  10/18/15 240 lb 11.9 oz (109.2 kg)    Ideal Body Weight:  91.82 kg (kg)  BMI:  Body mass index is 29.32 kg/(m^2).  Estimated Nutritional Needs:   Kcal:  2400-2600  Protein:  110-120 grams  Fluid:  2.2-2.5 L/day  EDUCATION NEEDS:   No education needs identified at this time     Jarome Matin, RD, LDN Inpatient Clinical Dietitian Pager # [phone] After hours/weekend pager # [phone]

## 2015-10-19 NOTE — Progress Notes (Signed)
PROGRESS NOTE  Jaime [name] [id number] DOB: June 26, 1956 DOA: 10/18/2015 PCP: Velna Hatchet, MD  HPI/Recap of past 44 hours: 59 year old male with past medical history of paroxysmal atrial fibrillation, colon cancer status post chemotherapy and pulmonary embolus admitted on 10/18 for hypovolemic shock that has been ongoing for the past few weeks secondary to continued diarrhea. Initial blood pressure with a systolic of 60, but lactic acid level normal. Patient noted to have elevated white blood cell count of 24 and a creatinine of 1.7. Started on IV fluids plus Zosyn and placed in stepdown unit. C. difficile cultures negative. Patient feeling better today. No abdominal pain or nausea. Appetite slowly improving  Assessment/Plan: Principal Problem:   Hypovolemic shock (HCC) causing hypotension and secondary acute kidney injury: No evidence of sepsis given normal lactic acidosis. Hypotension secondary to diarrhea and continued Lasix use. Creatinine slightly improving, recheck labs in the morning Active Problems:   Malnutrition of moderate degree (HCC)   Colon cancer [facility]): Being followed by oncology   PAF (paroxysmal atrial fibrillation) ([place]): Currently in normal sinus rhythm, already on anticoagulation   Acute saddle pulmonary embolism with acute cor pulmonale [facility]): Have restarted xarelto   Chronic diastolic congestive heart failure ([place]): Continue to need IV fluids, given stable blood pressure, will decrease rate. No current signs of volume overload   Anemia of chronic disease: Secondary to cancer. Continue iron     Diarrhea: Unclear etiology. No signs of infection, final stool cultures pending. C. difficile negative. Suspect maybe enteritis? Have stop Zosyn. Continue to monitor. We'll start antidiarrheal agent  Edema: Lasix on hold until renal function better  Code Status: Full code  Family Communication: Wife at the bedside  Disposition Plan: Improving, transfer to floor,  anticipate discharge likely within next 48 hours   Consultants:  Oncology  Procedures:  None  Antibiotics:  Zosyn 10/18-10/19   Objective: BP 109/74 mmHg  Pulse 89  Temp(Src) 97.4 F (36.3 C) (Oral)  Resp 17  Ht 6\' 4"  (1.93 m)  Wt 109.2 kg (240 lb 11.9 oz)  BMI 29.32 kg/m2  SpO2 100%  Intake/Output Summary (Last 24 hours) at 10/19/15 1507 Last data filed at 10/19/15 1200  Gross per 24 hour  Intake   2300 ml  Output   1050 ml  Net   1250 ml   Filed Weights   10/18/15 2300  Weight: 109.2 kg (240 lb 11.9 oz)    Exam:   General:  Alert and oriented 3, no acute distress  Cardiovascular: Regular rate and rhythm, S1-S2  Respiratory: Clear to auscultation bilaterally  Abdomen: Soft, nontender, nondistended, positive bowel sounds  Musculoskeletal: 2+ pitting edema from the knees down-chronic   Data Reviewed: Basic Metabolic Panel:  Recent Labs Lab 10/18/15 0843 10/19/15 0355  NA 134* 133*  K 3.7 3.1*  CL  --  105  CO2 20* 17*  GLUCOSE 126 116*  BUN 36.8* 40*  CREATININE 1.7* 1.69*  CALCIUM 8.6 8.0*   Liver Function Tests:  Recent Labs Lab 10/18/15 0843 10/19/15 0355  AST 26 22  ALT 14 13*  ALKPHOS 367* 293*  BILITOT 2.57* 2.0*  PROT 6.2* 5.8*  ALBUMIN 1.8* 1.8*   No results for input(s): LIPASE, AMYLASE in the last 168 hours. No results for input(s): AMMONIA in the last 168 hours. CBC:  Recent Labs Lab 10/18/15 0842 10/19/15 0355  WBC 23.2* 16.1*  NEUTROABS 19.4* 13.6*  HGB 11.0* 10.2*  HCT 34.1* 31.9*  MCV 87.7 89.1  PLT 402* 335  Cardiac Enzymes:   No results for input(s): CKTOTAL, CKMB, CKMBINDEX, TROPONINI in the last 168 hours. BNP (last 3 results) No results for input(s): BNP in the last 8760 hours.  ProBNP (last 3 results) No results for input(s): PROBNP in the last 8760 hours.  CBG:  Recent Labs Lab 10/19/15 1053  GLUCAP 114*    Recent Results (from the past 240 hour(s))  C difficile quick scan w PCR  reflex     Status: None   Collection Time: 10/18/15 11:32 AM  Result Value Ref Range Status   C Diff antigen NEGATIVE NEGATIVE Final   C Diff toxin NEGATIVE NEGATIVE Final   C Diff interpretation Negative for toxigenic C. difficile  Final  Ova and parasite examination     Status: None   Collection Time: 10/18/15 11:32 AM  Result Value Ref Range Status   Specimen Description STOOL  Final   Special Requests NONE  Final   Ova and parasites   Final    NO OVA OR PARASITES SEEN Performed at [facility]    Report Status 10/19/2015 FINAL  Final  MRSA PCR Screening     Status: None   Collection Time: 10/18/15 11:12 PM  Result Value Ref Range Status   MRSA by PCR NEGATIVE NEGATIVE Final    Comment:        The GeneXpert MRSA Assay (FDA approved for NASAL specimens only), is one component of a comprehensive MRSA colonization surveillance program. It is not intended to diagnose MRSA infection nor to guide or monitor treatment for MRSA infections.      Studies: Ct Abdomen Pelvis Wo Contrast  10/18/2015  CLINICAL DATA:  Acute onset of nausea, vomiting and diarrhea. Recently diagnosed pulmonary embolus. Status post chemotherapy for colorectal adenocarcinoma. Initial encounter. EXAM: CT ABDOMEN AND PELVIS WITHOUT CONTRAST TECHNIQUE: Multidetector CT imaging of the abdomen and pelvis was performed following the standard protocol without IV contrast. COMPARISON:  CT of the abdomen and pelvis performed 07/13/2015 FINDINGS: Scattered nodules of varying size are noted at the lung bases, compatible with metastatic disease. Trace bilateral pleural effusions are seen, right greater than left. Extensive masses are again noted throughout the liver, measuring up to 14.6 cm in size. These appear mildly worsened from the prior study. Minimally decreased attenuation at the inferior aspect of the spleen may also reflect metastatic disease. Cholelithiasis is noted; vicarious contrast excretion is seen in  the gallbladder. The gallbladder is not well assessed due to surrounding ascites. Moderate to large volume ascites is noted throughout the abdomen and pelvis. This is increased from the prior study. There is increasing diffuse omental edema, with slight nodularity, raising question for mild omental caking. The pancreas is unremarkable in appearance. A 1.9 cm nodule is noted at the right adrenal gland. The left adrenal gland is unremarkable in appearance. The kidneys are unremarkable in appearance. There is no evidence of hydronephrosis. No renal or ureteral stones are seen. Nonspecific perinephric stranding is noted bilaterally. There is distention of the cecum to 10.5 cm; the colon is largely filled with fluid. There is mildly irregular wall thickening along the descending colon, and a persistent 5.2 x 3.3 cm mass is noted at the mid sigmoid colon. Findings likely reflect some degree of dysmotility. The small bowel is unremarkable in appearance. The stomach is within normal limits. No acute vascular abnormalities are seen. Mild scattered calcification is noted along the abdominal aorta and its branches. The bladder is mildly distended and grossly unremarkable. The prostate  remains normal in size. No inguinal lymphadenopathy is seen. Diffuse soft tissue edema about the abdomen and pelvis likely reflects mild anasarca. No acute osseous abnormalities are identified. IMPRESSION: 1. Distention of the cecum to 10.5 cm. The colon is largely filled with fluid. Mildly irregular wall thickening along the descending colon, and persistent 5.2 x 3.3 cm mass at the mid sigmoid colon. Bowel distention likely reflects some degree of dysmotility. 2. Worsening moderate to large volume ascites within the abdomen and pelvis. Increasing diffuse omental edema, with slight nodularity, raising question for mild omental caking. 3. Extensive masses throughout the liver, measuring up to 14.6 cm in size, appear mildly worsened from the prior  study. Minimally decreased attenuation at the inferior aspect of the spleen may also reflect metastatic disease. 4. 1.9 cm right adrenal nodule noted; this has decreased mildly in size. 5. Scattered nodules of varying size at the lung bases, more prominent than on the prior study and compatible with metastatic disease. Trace bilateral pleural effusions, right greater than left. 6. Cholelithiasis; gallbladder otherwise grossly unremarkable. 7. Mild scattered calcification along the abdominal aorta and its branches. 8. Diffuse soft tissue edema about the abdomen and pelvis likely reflects mild anasarca. Electronically Signed   By: Garald Balding M.D.   On: 10/18/2015 22:50    Scheduled Meds: . antiseptic oral rinse  7 mL Mouth Rinse BID  . levothyroxine  75 mcg Oral QAC breakfast  . loperamide  2 mg Oral TID  . Rivaroxaban  15 mg Oral BID WC  . [START ON 10/29/2015] rivaroxaban  20 mg Oral Q supper    Continuous Infusions: . sodium chloride 75 mL/hr at 10/19/15 1048     Time spent: 35 minutes  [place] [facility] Pager [phone]. If 7PM-7AM, please contact night-coverage at [email or web address], password [facility] 10/19/2015, 3:07 PM  LOS: 1 day

## 2015-10-19 NOTE — Care Management Note (Signed)
Case Management Note  Patient Details  Name: Carmin Dibartolo MRN: [id number] Date of Birth: 06-Nov-1956  Subjective/Objective:      Hypovolemia due to diarrhea              Action/Plan:Date: October 19, 2015 Chart reviewed for concurrent status and case management needs. Will continue to follow patient for changes and needs: Velva Harman, RN, BSN, [place]   [phone]   Expected Discharge Date:   (UNKNOWN)               Expected Discharge Plan:  [place]  In-House Referral:  Clinical Social Work  Discharge planning Services  CM Consult  Post Acute Care Choice:  NA Choice offered to:  NA  DME Arranged:    DME Agency:     HH Arranged:    [place] Agency:     Status of Service:  In process, will continue to follow  Medicare Important Message Given:    Date Medicare IM Given:    Medicare IM give by:    Date Additional Medicare IM Given:    Additional Medicare Important Message give by:     If discussed at [place] of Stay Meetings, dates discussed:    Additional Comments:  Leeroy Cha, RN 10/19/2015, 10:42 AM

## 2015-10-19 NOTE — Progress Notes (Signed)
[place] Work  Clinical Social Work met with patient's spouse in [employer].  Patient's had questions regarding disability process and best options for patient at this time.  The patient was recently receiving short term disability and is hoping that long term disability through his employer will begin soon.  CSW and patient/family discussed social security disability options in chemo room.  Patient and patient's spouse shared they are very overwhelmed at this time due to the severity of patient's illness.  CSW and patient's spouse completed application for disability assistance through [facility].  CSW provided brief emotional support and encouraged patient/spouse to follow up with Support Team.  Polo Riley, MSW, LCSW, OSW-C Clinical Social Worker [place] [phone]

## 2015-10-20 ENCOUNTER — Ambulatory Visit: Payer: Managed Care, Other (non HMO)

## 2015-10-20 DIAGNOSIS — L8990 Pressure ulcer of unspecified site, unspecified stage: Secondary | ICD-10-CM | POA: Diagnosis present

## 2015-10-20 LAB — CBC
HCT: 28.6 % — ABNORMAL LOW (ref 39.0–52.0)
HEMOGLOBIN: 9.2 g/dL — AB (ref 13.0–17.0)
MCH: 28.6 pg (ref 26.0–34.0)
MCHC: 32.2 g/dL (ref 30.0–36.0)
MCV: 88.8 fL (ref 78.0–100.0)
Platelets: 244 10*3/uL (ref 150–400)
RBC: 3.22 MIL/uL — AB (ref 4.22–5.81)
RDW: 17.6 % — ABNORMAL HIGH (ref 11.5–15.5)
WBC: 7.4 10*3/uL (ref 4.0–10.5)

## 2015-10-20 LAB — BASIC METABOLIC PANEL
ANION GAP: 8 (ref 5–15)
BUN: 40 mg/dL — ABNORMAL HIGH (ref 6–20)
CHLORIDE: 108 mmol/L (ref 101–111)
CO2: 19 mmol/L — ABNORMAL LOW (ref 22–32)
Calcium: 7.9 mg/dL — ABNORMAL LOW (ref 8.9–10.3)
Creatinine, Ser: 1.57 mg/dL — ABNORMAL HIGH (ref 0.61–1.24)
GFR, EST AFRICAN AMERICAN: 54 mL/min — AB (ref >60)
GFR, EST NON AFRICAN AMERICAN: 46 mL/min — AB (ref >60)
Glucose, Bld: 132 mg/dL — ABNORMAL HIGH (ref 65–99)
POTASSIUM: 2.8 mmol/L — AB (ref 3.5–5.1)
SODIUM: 135 mmol/L (ref 135–145)

## 2015-10-20 LAB — GLUCOSE, CAPILLARY
Glucose-Capillary: 148 mg/dL — ABNORMAL HIGH (ref 65–99)
Glucose-Capillary: 182 mg/dL — ABNORMAL HIGH (ref 65–99)

## 2015-10-20 LAB — GIARDIA/CRYPTOSPORIDIUM SCREEN(EIA)
Cryptosporidium Screen (EIA): NEGATIVE
Giardia Screen - EIA: NEGATIVE

## 2015-10-20 LAB — CMV (CYTOMEGALOVIRUS) DNA ULTRAQUANT, PCR
CMV DNA QUANT: NEGATIVE [IU]/mL
LOG10 CMV QN DNA PL: UNDETERMINED {Log_IU}/mL

## 2015-10-20 MED ORDER — DIPHENOXYLATE-ATROPINE 2.5-0.025 MG PO TABS: 1.0000 | ORAL_TABLET | Freq: Four times a day (QID) | ORAL | Status: DC | PRN

## 2015-10-20 NOTE — Care Management Note (Signed)
Case Management Note  Patient Details  Name: Jaime [name] MRN: [id number] Date of Birth: 04-23-1956  Subjective/Objective:  Transfer from SDU. AKI, hypotension, diarrhea, GI panel, flexiseal. From SNF. May need PT cons.                 Action/Plan:d/c plan SNF.   Expected Discharge Date:   (UNKNOWN)               Expected Discharge Plan:  Skilled Nursing Facility  In-House Referral:  Clinical Social Work  Discharge planning Services  CM Consult  Post Acute Care Choice:  NA Choice offered to:  NA  DME Arranged:    DME Agency:     HH Arranged:    [place] Agency:     Status of Service:  In process, will continue to follow  Medicare Important Message Given:    Date Medicare IM Given:    Medicare IM give by:    Date Additional Medicare IM Given:    Additional Medicare Important Message give by:     If discussed at [place] of Stay Meetings, dates discussed:    Additional Comments:  Dessa Phi, RN 10/20/2015, 3:38 PM

## 2015-10-20 NOTE — Progress Notes (Signed)
PROGRESS NOTE  Jaime [name] [id number] DOB: 17-Dec-1956 DOA: 10/18/2015 PCP: Velna Hatchet, MD  HPI/Recap of past 9 hours: 59 year old male with past medical history of paroxysmal atrial fibrillation, colon cancer status post chemotherapy and pulmonary embolus admitted on 10/18 for hypovolemic shock that has been ongoing for the past few weeks secondary to continued diarrhea. Initial blood pressure with a systolic of 60, but lactic acid level normal. Patient noted to have elevated white blood cell count of 24 and a creatinine of 1.7. Started on IV fluids plus Zosyn and placed in stepdown unit. C. difficile cultures negative.  Transferred out of unit by 10/19.  Today patient doing okay. Less diarrhea, although still occurring despite scheduled and when necessary Imodium. More appetite. No pain. White count now normal.   Assessment/Plan: Principal Problem:   Hypovolemic shock (HCC) causing hypotension and secondary acute kidney injury: No evidence of sepsis given normal lactic acidosis. Hypotension secondary to diarrhea and continued Lasix use. Creatinine continues to improve, continuing IV fluids. Active Problems:   Malnutrition of moderate degree (HCC)   Colon cancer [facility]): Being followed by oncology   PAF (paroxysmal atrial fibrillation) ([place]): Currently in normal sinus rhythm, already on anticoagulation   Acute saddle pulmonary embolism with acute cor pulmonale [facility]): Have restarted xarelto   Chronic diastolic congestive heart failure ([place]): Continue to need IV fluids, given stable blood pressure, will decrease rate. No current signs of volume overload   Anemia of chronic disease: Secondary to cancer. Continue iron     Diarrhea: Unclear etiology. No signs of infection, final stool cultures pending. C. difficile negative. Suspect maybe enteritis? Leukocytosis now normal. Stopped antibiotics after first day. On when necessary plus scheduled Imodium  Edema: Lasix on hold until  renal function better  Code Status: Full code  Family Communication: Left message with wife  Disposition Plan: Hoping to discharge once diarrhea resolves, possibly next few days   Consultants:  Oncology  Procedures:  None  Antibiotics:  Zosyn 10/18-10/19   Objective: BP 100/60 mmHg  Pulse 83  Temp(Src) 97.2 F (36.2 C) (Oral)  Resp 18  Ht 6\' 4"  (1.93 m)  Wt 109.2 kg (240 lb 11.9 oz)  BMI 29.32 kg/m2  SpO2 99%  Intake/Output Summary (Last 24 hours) at 10/20/15 1245 Last data filed at 10/20/15 0700  Gross per 24 hour  Intake 2243.75 ml  Output    150 ml  Net 2093.75 ml   Filed Weights   10/18/15 2300  Weight: 109.2 kg (240 lb 11.9 oz)    Exam:   General:  Alert and oriented 3, somewhat sleepy  Cardiovascular: Regular rate and rhythm, S1-S2  Respiratory: Clear to auscultation bilaterally  Abdomen: Soft, nontender, nondistended, moderate bowel sounds  Musculoskeletal: 2+ pitting edema from the knees down-chronic   Data Reviewed: Basic Metabolic Panel:  Recent Labs Lab 10/18/15 0843 10/19/15 0355 10/20/15 0730  NA 134* 133* 135  K 3.7 3.1* 2.8*  CL  --  105 108  CO2 20* 17* 19*  GLUCOSE 126 116* 132*  BUN 36.8* 40* 40*  CREATININE 1.7* 1.69* 1.57*  CALCIUM 8.6 8.0* 7.9*   Liver Function Tests:  Recent Labs Lab 10/18/15 0843 10/19/15 0355  AST 26 22  ALT 14 13*  ALKPHOS 367* 293*  BILITOT 2.57* 2.0*  PROT 6.2* 5.8*  ALBUMIN 1.8* 1.8*   No results for input(s): LIPASE, AMYLASE in the last 168 hours. No results for input(s): AMMONIA in the last 168 hours. CBC:  Recent Labs Lab  10/18/15 0842 10/19/15 0355 10/20/15 0730  WBC 23.2* 16.1* 7.4  NEUTROABS 19.4* 13.6*  --   HGB 11.0* 10.2* 9.2*  HCT 34.1* 31.9* 28.6*  MCV 87.7 89.1 88.8  PLT 402* 335 244   Cardiac Enzymes:   No results for input(s): CKTOTAL, CKMB, CKMBINDEX, TROPONINI in the last 168 hours. BNP (last 3 results) No results for input(s): BNP in the last 8760  hours.  ProBNP (last 3 results) No results for input(s): PROBNP in the last 8760 hours.  CBG:  Recent Labs Lab 10/19/15 1053 10/19/15 2221 10/20/15 0750 10/20/15 1210  GLUCAP 114* 139* 148* 182*    Recent Results (from the past 240 hour(s))  C difficile quick scan w PCR reflex     Status: None   Collection Time: 10/18/15 11:32 AM  Result Value Ref Range Status   C Diff antigen NEGATIVE NEGATIVE Final   C Diff toxin NEGATIVE NEGATIVE Final   C Diff interpretation Negative for toxigenic C. difficile  Final  Stool culture     Status: None (Preliminary result)   Collection Time: 10/18/15 11:32 AM  Result Value Ref Range Status   Specimen Description STOOL  Final   Special Requests NONE  Final   Culture   Final    NO SUSPICIOUS COLONIES, CONTINUING TO HOLD Performed at [facility]    Report Status PENDING  Incomplete  Ova and parasite examination     Status: None   Collection Time: 10/18/15 11:32 AM  Result Value Ref Range Status   Specimen Description STOOL  Final   Special Requests NONE  Final   Ova and parasites   Final    NO OVA OR PARASITES SEEN Performed at [facility]    Report Status 10/19/2015 FINAL  Final  MRSA PCR Screening     Status: None   Collection Time: 10/18/15 11:12 PM  Result Value Ref Range Status   MRSA by PCR NEGATIVE NEGATIVE Final    Comment:        The GeneXpert MRSA Assay (FDA approved for NASAL specimens only), is one component of a comprehensive MRSA colonization surveillance program. It is not intended to diagnose MRSA infection nor to guide or monitor treatment for MRSA infections.      Studies: No results found.  Scheduled Meds: . antiseptic oral rinse  7 mL Mouth Rinse BID  . levothyroxine  75 mcg Oral QAC breakfast  . loperamide  2 mg Oral TID  . Rivaroxaban  15 mg Oral BID WC  . [START ON 10/29/2015] rivaroxaban  20 mg Oral Q supper    Continuous Infusions: . sodium chloride 100 mL/hr at 10/20/15  1238     Time spent: 15 minutes  [place] [facility] Pager [phone]. If 7PM-7AM, please contact night-coverage at [email or web address], password [facility] 10/20/2015, 12:45 PM  LOS: 2 days

## 2015-10-20 NOTE — Progress Notes (Signed)
IP PROGRESS NOTE  Subjective:   He continues to have diarrhea. No other complaint.  Objective: Vital signs in last 24 hours: Blood pressure 100/60, pulse 83, temperature 97.2 F (36.2 C), temperature source Oral, resp. rate 18, height 6\' 4"  (1.93 m), weight 240 lb 11.9 oz (109.2 kg), SpO2 99 %.  Intake/Output from previous day: 10/19 0701 - 10/20 0700 In: 3103.8 [P.O.:720; I.V.:2333.8; IV Piggyback:50] Out: 350 [Urine:150; Stool:200]  Physical Exam:  HEENT: No thrush  Abdomen: Markedly distended, the liver edge is palpable in the right upper abdomen, loose tan stool in the rectal tube Extremities: Pitting edema throughout the left greater than right leg   Portacath/PICC-without erythema  Lab Results:  Recent Labs  10/19/15 0355 10/20/15 0730  WBC 16.1* 7.4  HGB 10.2* 9.2*  HCT 31.9* 28.6*  PLT 335 244    BMET  Recent Labs  10/19/15 0355 10/20/15 0730  NA 133* 135  K 3.1* 2.8*  CL 105 108  CO2 17* 19*  GLUCOSE 116* 132*  BUN 40* 40*  CREATININE 1.69* 1.57*  CALCIUM 8.0* 7.9*    Studies/Results: Ct Abdomen Pelvis Wo Contrast  10/18/2015  CLINICAL DATA:  Acute onset of nausea, vomiting and diarrhea. Recently diagnosed pulmonary embolus. Status post chemotherapy for colorectal adenocarcinoma. Initial encounter. EXAM: CT ABDOMEN AND PELVIS WITHOUT CONTRAST TECHNIQUE: Multidetector CT imaging of the abdomen and pelvis was performed following the standard protocol without IV contrast. COMPARISON:  CT of the abdomen and pelvis performed 07/13/2015 FINDINGS: Scattered nodules of varying size are noted at the lung bases, compatible with metastatic disease. Trace bilateral pleural effusions are seen, right greater than left. Extensive masses are again noted throughout the liver, measuring up to 14.6 cm in size. These appear mildly worsened from the prior study. Minimally decreased attenuation at the inferior aspect of the spleen may also reflect metastatic disease.  Cholelithiasis is noted; vicarious contrast excretion is seen in the gallbladder. The gallbladder is not well assessed due to surrounding ascites. Moderate to large volume ascites is noted throughout the abdomen and pelvis. This is increased from the prior study. There is increasing diffuse omental edema, with slight nodularity, raising question for mild omental caking. The pancreas is unremarkable in appearance. A 1.9 cm nodule is noted at the right adrenal gland. The left adrenal gland is unremarkable in appearance. The kidneys are unremarkable in appearance. There is no evidence of hydronephrosis. No renal or ureteral stones are seen. Nonspecific perinephric stranding is noted bilaterally. There is distention of the cecum to 10.5 cm; the colon is largely filled with fluid. There is mildly irregular wall thickening along the descending colon, and a persistent 5.2 x 3.3 cm mass is noted at the mid sigmoid colon. Findings likely reflect some degree of dysmotility. The small bowel is unremarkable in appearance. The stomach is within normal limits. No acute vascular abnormalities are seen. Mild scattered calcification is noted along the abdominal aorta and its branches. The bladder is mildly distended and grossly unremarkable. The prostate remains normal in size. No inguinal lymphadenopathy is seen. Diffuse soft tissue edema about the abdomen and pelvis likely reflects mild anasarca. No acute osseous abnormalities are identified. IMPRESSION: 1. Distention of the cecum to 10.5 cm. The colon is largely filled with fluid. Mildly irregular wall thickening along the descending colon, and persistent 5.2 x 3.3 cm mass at the mid sigmoid colon. Bowel distention likely reflects some degree of dysmotility. 2. Worsening moderate to large volume ascites within the abdomen and pelvis. Increasing  diffuse omental edema, with slight nodularity, raising question for mild omental caking. 3. Extensive masses throughout the liver,  measuring up to 14.6 cm in size, appear mildly worsened from the prior study. Minimally decreased attenuation at the inferior aspect of the spleen may also reflect metastatic disease. 4. 1.9 cm right adrenal nodule noted; this has decreased mildly in size. 5. Scattered nodules of varying size at the lung bases, more prominent than on the prior study and compatible with metastatic disease. Trace bilateral pleural effusions, right greater than left. 6. Cholelithiasis; gallbladder otherwise grossly unremarkable. 7. Mild scattered calcification along the abdominal aorta and its branches. 8. Diffuse soft tissue edema about the abdomen and pelvis likely reflects mild anasarca. Electronically Signed   By: Garald Balding M.D.   On: 10/18/2015 22:50    Medications: I have reviewed the patient's current medications.  Assessment/Plan: 1. 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, status post a colonoscopy 07/21/2015 confirming a sigmoid colon mass  Staging CT scans 07/13/2015 and 07/14/2015 confirmed extensive liver metastases, lung nodules, a right pleural effusion, and porta hepatis lymphadenopathy  Cycle 1 FOLFOX (chemotherapy doses reduced and bolus 5-FU eliminated) 08/02/2015  Cycle 2 FOLFOX (chemotherapy doses reduced and bolus 5-FU eliminated) 08/16/2015  Cycle 3 FOLFOX (Avastin added, bolus 5-FU introduced, and infusional 5-FU dose escalated) 08/30/2015  Cycle 4 FOLFOX plus Avastin 09/13/2015  Cycle 5 held on 09/27/2015 due to mild neutropenia, recent diarrhea.  CT of the chest 10/02/2015, compared to a CT from 07/13/2015-no significant change in lung nodules or liver metastases  Diagnostic/therapeutic paracentesis 10/05/2015-negative cytology  CEA lower on 10/11/2015  CT 10/18/2015 with mild progression of liver metastases and lung lesions  2. Obstructive jaundice, likely secondary to porta hepatis lymphadenopathy, status post placement of a bile duct stent on 07/14/2015  3.  Microcytic anemia secondary to the colon mass-improved  4. Diabetes  5. Failure to thrive secondary to metastatic colon cancer  6. Port-A-Cath placement 08/01/2015  7. Nausea following cycle 1 and cycle 2 FOLFOX-emend added with cycle 3  8. Diarrhea following cycle 4 FOLFOX/Avastin.  9. Neutropenia following cycle 4 FOLFOX/Avastin. Neulasta to be added with cycle 5.  10. Admission 10/02/2015 with bilateral DVTs and pulmonary emboli-maintained on Xarelto 10/07/2015  11. Diarrhea-C. difficile negative, persistent  12. Hypotension-likely secondary to dehydration, improved  He appears more alert compared to hospital admission. The hypotension improved with hydration. He continues to have frequent diarrhea despite Imodium.  Mr. Velez has advanced metastatic colon cancer. He would like to continue palliative systemic chemotherapy when the diarrhea improves. I discussed CPR and ACLS issues with him again this morning. His wife is not present and he would like to wait until she is here to make a CODE STATUS decision. I explained that I am concerned his performance status may not return to a point where chemotherapy is appropriate..  Recommendations: 1. Continue supportive care, replete potassium 2. Increase diet and ambulation as tolerated 3. Add Lomotil, consider tincture of opium or octreotide for refractory diarrhea 4. Continue goals of care discussion when his wife is present  LOS: 2 days   [place], [place]  10/20/2015, 9:30 AM

## 2015-10-21 ENCOUNTER — Inpatient Hospital Stay (HOSPITAL_COMMUNITY): Payer: Managed Care, Other (non HMO)

## 2015-10-21 DIAGNOSIS — R935 Abnormal findings on diagnostic imaging of other abdominal regions, including retroperitoneum: Secondary | ICD-10-CM

## 2015-10-21 DIAGNOSIS — R571 Hypovolemic shock: Secondary | ICD-10-CM

## 2015-10-21 DIAGNOSIS — K909 Intestinal malabsorption, unspecified: Secondary | ICD-10-CM

## 2015-10-21 LAB — GI PATHOGEN PANEL BY PCR, STOOL
C DIFFICILE TOXIN A/B: NOT DETECTED
CAMPYLOBACTER BY PCR: NOT DETECTED
CRYPTOSPORIDIUM BY PCR: NOT DETECTED
E COLI 0157 BY PCR: NOT DETECTED
E coli (ETEC) LT/ST: NOT DETECTED
E coli (STEC): NOT DETECTED
G LAMBLIA BY PCR: NOT DETECTED
Norovirus GI/GII: NOT DETECTED
ROTAVIRUS A BY PCR: NOT DETECTED
SALMONELLA BY PCR: NOT DETECTED
Shigella by PCR: NOT DETECTED

## 2015-10-21 LAB — CBC
HEMATOCRIT: 28.8 % — AB (ref 39.0–52.0)
Hemoglobin: 9 g/dL — ABNORMAL LOW (ref 13.0–17.0)
MCH: 27.8 pg (ref 26.0–34.0)
MCHC: 31.3 g/dL (ref 30.0–36.0)
MCV: 88.9 fL (ref 78.0–100.0)
Platelets: 246 10*3/uL (ref 150–400)
RBC: 3.24 MIL/uL — ABNORMAL LOW (ref 4.22–5.81)
RDW: 17.6 % — AB (ref 11.5–15.5)
WBC: 6.9 10*3/uL (ref 4.0–10.5)

## 2015-10-21 LAB — BASIC METABOLIC PANEL
Anion gap: 9 (ref 5–15)
BUN: 38 mg/dL — AB (ref 6–20)
CHLORIDE: 109 mmol/L (ref 101–111)
CO2: 19 mmol/L — ABNORMAL LOW (ref 22–32)
Calcium: 7.8 mg/dL — ABNORMAL LOW (ref 8.9–10.3)
Creatinine, Ser: 1.18 mg/dL (ref 0.61–1.24)
GFR calc Af Amer: >60 mL/min (ref >60)
GFR calc non Af Amer: >60 mL/min (ref >60)
GLUCOSE: 113 mg/dL — AB (ref 65–99)
POTASSIUM: 2.9 mmol/L — AB (ref 3.5–5.1)
Sodium: 137 mmol/L (ref 135–145)

## 2015-10-21 LAB — GLUCOSE, CAPILLARY: Glucose-Capillary: 118 mg/dL — ABNORMAL HIGH (ref 65–99)

## 2015-10-21 MED ORDER — POTASSIUM CHLORIDE CRYS ER 20 MEQ PO TBCR
40.0000 meq | EXTENDED_RELEASE_TABLET | ORAL | Status: AC
  Administered 2015-10-21 (×2): 40 meq via ORAL
  Filled 2015-10-21 (×2): qty 2

## 2015-10-21 MED ORDER — POTASSIUM CHLORIDE CRYS ER 20 MEQ PO TBCR
40.0000 meq | EXTENDED_RELEASE_TABLET | ORAL | Status: DC
Start: 2015-10-21 — End: 2015-10-21

## 2015-10-21 NOTE — Clinical Social Work Note (Signed)
Clinical Social Work Assessment  Patient Details  Name: Jaime [name] MRN: [id number] Date of Birth: September 06, 1956  Date of referral:  10/21/15               Reason for consult:  Discharge Planning, Facility Placement                Permission sought to share information with:  Facility Art therapist granted to share information::  Yes, Verbal Permission Granted  Name::        Agency::     Relationship::     Contact Information:     Housing/Transportation Living arrangements for the past 2 months:  [place], [place] of Information:  Patient, Spouse Patient Interpreter Needed:  None Criminal Activity/Legal Involvement Pertinent to Current Situation/Hospitalization:  No - Comment as needed Significant Relationships:  Spouse Lives with:  Facility Resident, Spouse Do you feel safe going back to the place where you live?  Yes Need for family participation in patient care:  Yes (Comment)  Care giving concerns:  Spouse is unable to provided needed care at d/c.    Social Worker assessment / plan:  Pt hospitalized on 10/02/15 with multiple medical concerns including Hypovolemic shock and diarrhea ( unknown etiology ). Pt was d/c to [facility] on 10/12/15 from [facility] for ST Rehab. CSW met with pt / spouse to assist with d/c planning. Pt / spouse feel continued rehab will be needed at d/c and would like pt to return to [facility]. SNF contacted and d/c plan confirmed pending [employer] insurance authorization received prior to d/c. Clinicals sent to SNF for review. PT eval is pending and will be sent to SNF when available.  Employment status:  [employer] information:  Managed Care PT Recommendations:  Not assessed at this time Information / Referral to community resources:  [place]  Patient/Family's Response to care:  Pt agrees with plan to return to SNF at d/c.   Patient/Family's Understanding of and Emotional  Response to Diagnosis, Current Treatment, and Prognosis:  Pt / spouse are aware of pt's medical status. Pt/ spouse are aware that pt needs to work with PT in order for SNF to request prior authorization for admission. Pt is reluctant to work with PT, at this time, due to diarrhea / rectal  tube. Pt encouraged to try to work with PT. Spouse will also offer pt encouragement. Both pt / spouse are frustrated with medical status. Spouse is concerned regarding d/c planning and needing insurance authorization, and pt declining to work with PT today. Support / reassurance provided to pt / spouse.  Emotional Assessment Appearance:  Appears stated age (Pt reports he is uncomfortable.) Attitude/Demeanor/Rapport:  Other Affect (typically observed):  Frustrated Orientation:  Oriented to Self, Oriented to Place, Oriented to  Time, Oriented to Situation Alcohol / Substance use:  Not Applicable Psych involvement (Current and /or in the community):  No (Comment)  Discharge Needs  Concerns to be addressed:  Discharge Planning Concerns Readmission within the last 30 days:  Yes Current discharge risk:  None Barriers to Discharge:  No Barriers Identified   Luretha Rued, [place] 10/21/2015, 2:19 PM

## 2015-10-21 NOTE — Progress Notes (Signed)
PT Cancellation Note  Patient Details Name: Arn Mcomber MRN: [id number] DOB: [birth year]   Cancelled Treatment:    Reason Eval/Treat Not Completed: Patient declined, no reason specified   Deisha Stull,KATHrine E 10/21/2015, 11:06 AM Carmelia Bake, PT, DPT 10/21/2015 Pager: [phone]

## 2015-10-21 NOTE — Progress Notes (Signed)
PROGRESS NOTE  Jaime [name] NWG:[id number] DOB: 1956/09/19 DOA: 10/18/2015 PCP: Velna Hatchet, MD  HPI/Recap of past 38 hours: 59 year old male with past medical history of paroxysmal atrial fibrillation, colon cancer status post chemotherapy and pulmonary embolus admitted on 10/18 for hypovolemic shock that has been ongoing for the past few weeks secondary to continued diarrhea. Initial blood pressure with a systolic of 60, but lactic acid level normal. Patient noted to have elevated white blood cell count of 24 and a creatinine of 1.7. Started on IV fluids plus Zosyn and placed in stepdown unit. C. difficile cultures negative.  Transferred out of unit by 10/19.  He is alert, trying to eat lunch. Denies abdominal pain. He has rectal tube with only small amount of stool  Assessment/Plan:   Hypovolemic shock (HCC) causing hypotension and secondary acute kidney injury: No evidence of sepsis given normal lactic acidosis. Hypotension secondary to diarrhea and continued Lasix use. -Creatinine continues to improve, continuing IV fluids.    Diarrhea: Unclear etiology. No signs of infection, final stool cultures pending. C. difficile negative. Suspect maybe enteritis? Leukocytosis now normal. Stopped antibiotics after first day. On when necessary plus scheduled Imodium.  -CT abdomen: cecum dilation. Moderate ascites, wall thickening a long the descending colon, persist 5.2 x 3.3 Mass.  -still with diarrhea, cecal dilation on CT scan, stool culture pending. I will consult GI.     Hypokalemia; replete orally.     Malnutrition of moderate degree (HCC)    Colon cancer [facility]): Being followed by oncology    PAF (paroxysmal atrial fibrillation) ([place]): Currently in normal sinus rhythm, already on anticoagulation    Acute saddle pulmonary embolism with acute cor pulmonale ([place]): Continue with xarelto    Chronic diastolic congestive heart failure ([place]): Continue with  IV fluids.     Anemia of  chronic disease: Secondary to cancer. Continue iron    Edema: Lasix on hold until renal function better  Code Status: Full code  Family Communication: Lcare discussed with patient and Dr Sherill/   Disposition Plan: Hoping to discharge once diarrhea resolves, possibly next few days   Consultants:  Oncology  Procedures:  None  Antibiotics:  Zosyn 10/18-10/19   Objective: BP 124/82 mmHg  Pulse 81  Temp(Src) 97.6 F (36.4 C) (Oral)  Resp 18  Ht 6\' 4"  (1.93 m)  Wt 109.2 kg (240 lb 11.9 oz)  BMI 29.32 kg/m2  SpO2 99%  Intake/Output Summary (Last 24 hours) at 10/21/15 1238 Last data filed at 10/21/15 0644  Gross per 24 hour  Intake      0 ml  Output   1225 ml  Net  -1225 ml   Filed Weights   10/18/15 2300  Weight: 109.2 kg (240 lb 11.9 oz)    Exam:   General:  Alert and oriented 3,   Cardiovascular: Regular rate and rhythm, S1-S2  Respiratory: Clear to auscultation bilaterally  Abdomen: Soft, nontender, nondistended, moderate bowel sounds  Musculoskeletal: 2+ pitting edema from the knees down-chronic   Data Reviewed: Basic Metabolic Panel:  Recent Labs Lab 10/18/15 0843 10/19/15 0355 10/20/15 0730 10/21/15 0430  NA 134* 133* 135 137  K 3.7 3.1* 2.8* 2.9*  CL  --  105 108 109  CO2 20* 17* 19* 19*  GLUCOSE 126 116* 132* 113*  BUN 36.8* 40* 40* 38*  CREATININE 1.7* 1.69* 1.57* 1.18  CALCIUM 8.6 8.0* 7.9* 7.8*   Liver Function Tests:  Recent Labs Lab 10/18/15 0843 10/19/15 0355  AST 26 22  ALT 14 13*  ALKPHOS 367* 293*  BILITOT 2.57* 2.0*  PROT 6.2* 5.8*  ALBUMIN 1.8* 1.8*   No results for input(s): LIPASE, AMYLASE in the last 168 hours. No results for input(s): AMMONIA in the last 168 hours. CBC:  Recent Labs Lab 10/18/15 0842 10/19/15 0355 10/20/15 0730 10/21/15 0430  WBC 23.2* 16.1* 7.4 6.9  NEUTROABS 19.4* 13.6*  --   --   HGB 11.0* 10.2* 9.2* 9.0*  HCT 34.1* 31.9* 28.6* 28.8*  MCV 87.7 89.1 88.8 88.9  PLT 402* 335  244 246   Cardiac Enzymes:   No results for input(s): CKTOTAL, CKMB, CKMBINDEX, TROPONINI in the last 168 hours. BNP (last 3 results) No results for input(s): BNP in the last 8760 hours.  ProBNP (last 3 results) No results for input(s): PROBNP in the last 8760 hours.  CBG:  Recent Labs Lab 10/19/15 1053 10/19/15 2221 10/20/15 0750 10/20/15 1210 10/21/15 0757  GLUCAP 114* 139* 148* 182* 118*    Recent Results (from the past 240 hour(s))  C difficile quick scan w PCR reflex     Status: None   Collection Time: 10/18/15 11:32 AM  Result Value Ref Range Status   C Diff antigen NEGATIVE NEGATIVE Final   C Diff toxin NEGATIVE NEGATIVE Final   C Diff interpretation Negative for toxigenic C. difficile  Final  Stool culture     Status: None (Preliminary result)   Collection Time: 10/18/15 11:32 AM  Result Value Ref Range Status   Specimen Description STOOL  Final   Special Requests NONE  Final   Culture   Final    NO SUSPICIOUS COLONIES, CONTINUING TO HOLD Performed at [facility]    Report Status PENDING  Incomplete  Ova and parasite examination     Status: None   Collection Time: 10/18/15 11:32 AM  Result Value Ref Range Status   Specimen Description STOOL  Final   Special Requests NONE  Final   Ova and parasites   Final    NO OVA OR PARASITES SEEN Performed at [facility]    Report Status 10/19/2015 FINAL  Final  MRSA PCR Screening     Status: None   Collection Time: 10/18/15 11:12 PM  Result Value Ref Range Status   MRSA by PCR NEGATIVE NEGATIVE Final    Comment:        The GeneXpert MRSA Assay (FDA approved for NASAL specimens only), is one component of a comprehensive MRSA colonization surveillance program. It is not intended to diagnose MRSA infection nor to guide or monitor treatment for MRSA infections.   Culture, blood (routine x 2)     Status: None (Preliminary result)   Collection Time: 10/18/15 11:30 PM  Result Value Ref Range  Status   Specimen Description BLOOD RIGHT ARM  Final   Special Requests BOTTLES DRAWN AEROBIC AND ANAEROBIC 5ML  Final   Culture   Final    NO GROWTH 1 DAY Performed at [facility]    Report Status PENDING  Incomplete  Culture, blood (routine x 2)     Status: None (Preliminary result)   Collection Time: 10/19/15  3:55 AM  Result Value Ref Range Status   Specimen Description BLOOD LEFT ANTECUBITAL  Final   Special Requests BOTTLES DRAWN AEROBIC ONLY 5ML  Final   Culture   Final    NO GROWTH 1 DAY Performed at [facility]    Report Status PENDING  Incomplete     Studies:  No results found.  Scheduled Meds: . antiseptic oral rinse  7 mL Mouth Rinse BID  . levothyroxine  75 mcg Oral QAC breakfast  . loperamide  2 mg Oral TID  . potassium chloride  40 mEq Oral Q4H  . Rivaroxaban  15 mg Oral BID WC  . [START ON 10/29/2015] rivaroxaban  20 mg Oral Q supper    Continuous Infusions: . sodium chloride 100 mL/hr at 10/21/15 1055     Time spent: 15 minutes  Taryll Reichenberger, [place] [facility] Pager [phone]. If 7PM-7AM, please contact night-coverage at [email or web address], password [facility] 10/21/2015, 12:38 PM  LOS: 3 days

## 2015-10-21 NOTE — Consult Note (Signed)
Referring Provider:  [facility] Primary Care Physician:  Velna Hatchet, MD Primary Gastroenterologist:  Dr. Hilarie Fredrickson  Reason for Consultation:   diarrhea   HPI: Jaime [name] is a 59 y.o. male evaluated as an inpatient in July 2016 when he was admitted with obstructive jaundice and found to have metastatic cancer of unknown primary by CT with metastatic disease in the (on. He had elevated bilirubin and alkaline phosphatase indicative of obstruction and it was felt that some level of obstruction was likely due to intrahepatic cholestasis in the setting of liver tumor. He was also noted to have sigmoid thickening on CT and is never had a previous colonoscopy. ERCP 07/14/15: 1. 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 Proximal biliary dilation RECOMMENDATIONS: Await cytology and trend liver enzymes The cytology ([id number]) revealed malignant cells consistent with adenocarcinoma. The CA 19-9 and CEA returned markedly elevated.  He underwent a colonoscopy on 07/21/2015:ENDOSCOPIC IMPRESSION: Large circumferential mass as described above was found in the sigmoid colon; multiple biopsies of the lesion were performed Incomplete colonoscopy due to sigmoid narrowing caused by tumor RECOMMENDATIONS: 1. Await pathology results 2. Oncology follow-up Pathology:Colon, biopsy, sigmoid, mass - INVASIVE ADENOCARCINOMA.  He was subsequently evaluated by Dr. Benay Spice of oncology and FOLFOX chemotherapy was recommended. He has had 5 cycles the last of which was on 09/27/2015. His chemotherapy was complicated by diarrhea, nausea, and vomiting. He presented to the emergency room on October 2 with weakness and lower extremity swelling left greater than right, tachycardia, junction and was noted to have an acute pulmonary embolism.LE doppler demonstrated extensive, occlusive, acute DVT throughout the bilateral lower extremities, beginning  in the posterior tibial vein and the peroneal vein and coursing through to the popliteal, femoral, and common femoral veins. His CT angio chest demonstrated saddle PE. He was treated with Xarelto. He was discharged to rehabilitation facility on the 11th. He presented to the emergency room on the 18th worsening diarrhea and he is not associated with abdominal pain, nausea, vomiting, tenesmus, hematochezia, or melena. He did report that his appetite is diminished several days prior to admission. He evaluated earlier today by oncology and was noted to be hypotensive and sent to the ER for further workup. C. difficile PCR was negative. Stool for ova and parasites from this admission has been negative. Stool for Giardia and cryptosporidium has been negative as well.  Mr. Storlie had an abdominal pelvic CT on October 7 that showed distention of the cecum to 10.5 cm, the colon is largely filled with fluid. There is mildly irregular wall thickening along the descending colon, and a persistent 5.2 x 3 cm mass noted at the sigmoid. Findings likely reflect some degree of dysmotility. This polyp was unremarkable in appearance. The stomach is within normal limits. No acute vascular abnormalities are seen. Due to his diarrhea Lomotil was added to Imodium. Rectal tube was placed has only had a small amount of stool from this.  Past Medical History  Diagnosis Date  . Diabetes mellitus without complication ([place])   . Hypertension   . Metastatic carcinoma involving liver with unknown primary site [facility])   . Hyperlipidemia   . Chronic diastolic congestive heart failure ([place]) 10/09/2015    Past Surgical History  Procedure Laterality Date  . I&d extremity Left 06/19/2013    Procedure: IRRIGATION AND DEBRIDEMENT EXTREMITY;  Surgeon: Newt Minion, MD;  Location: [place];  Service: Orthopedics;  Laterality: Left;  . I&d extremity  Left 07/02/2013    Procedure: IRRIGATION AND DEBRIDEMENT EXTREMITY with placement of antibiotic  beads and application of wound vac;  Surgeon: Newt Minion, MD;  Location: [place];  Service: Orthopedics;  Laterality: Left;  Irrigation and Debridement Left Foot, Place Beads, Wound VAC  . Amputation Left 01/08/2014    Procedure: AMPUTATION RAY;  Surgeon: Newt Minion, MD;  Location: [place];  Service: Orthopedics;  Laterality: Left;  Left Foot 5th Ray Amputation  . Ercp N/A 07/14/2015    Procedure: ENDOSCOPIC RETROGRADE CHOLANGIOPANCREATOGRAPHY (ERCP);  Surgeon: Ladene Artist, MD;  Location[facility] ENDOSCOPY;  Service: Endoscopy;  Laterality: N/A;    Prior to Admission medications   Medication Sig Start Date End Date Taking? Authorizing Provider  Amino Acids-Protein Hydrolys (FEEDING SUPPLEMENT, PRO-STAT SUGAR FREE 64,) LIQD Take 30 mLs by mouth 2 (two) times daily.   Yes Historical Provider, MD  antiseptic oral rinse (CPC / CETYLPYRIDINIUM CHLORIDE 0.05%) 0.05 % LIQD solution 7 mLs by Mouth Rinse route 2 (two) times daily. 10/11/15  Yes Robbie Lis, MD  furosemide (LASIX) 40 MG tablet Take 1 tablet (40 mg total) by mouth 2 (two) times daily. 10/11/15  Yes Robbie Lis, MD  levothyroxine (SYNTHROID, LEVOTHROID) 75 MCG tablet Take 75 mcg by mouth daily before breakfast.   Yes Historical Provider, MD  Multiple Vitamins-Minerals (DECUBI-VITE PO) Take 1 capsule by mouth daily.   Yes Historical Provider, MD  Nutritional Supplements (NUTRITIONAL SHAKE PO) Take 120 mLs by mouth 2 (two) times daily. Med pass   Yes Historical Provider, MD  potassium chloride (KLOR-CON) 20 MEQ packet Take 20 mEq by mouth daily. 09/13/15  Yes Owens Shark, NP  Rivaroxaban (XARELTO) 15 MG TABS tablet Take 1 tablet (15 mg total) by mouth 2 (two) times daily with a meal. 10/11/15  Yes Robbie Lis, MD  rivaroxaban (XARELTO) 20 MG TABS tablet Take 20 mg by mouth daily with supper.   Yes Historical Provider, MD  diphenoxylate-atropine (LOMOTIL) 2.5-0.025 MG per tablet Take 1 tablet by mouth 4 (four) times daily as needed for  diarrhea or loose stools. 09/27/15   Owens Shark, NP  ergocalciferol (VITAMIN D2) 50000 UNITS capsule Take 50,000 Units by mouth 2 (two) times a week. Wed and Sat    Historical Provider, MD  feeding supplement, ENSURE ENLIVE, (ENSURE ENLIVE) LIQD Take 237 mLs by mouth 2 (two) times daily between meals. 10/11/15   Robbie Lis, MD  lidocaine-prilocaine (EMLA) cream Apply small amount of cream over port area 1-2 hours prior to treatment and cover with plastic wrap.  DO NOT RUB IN 07/26/15   Ladell Pier, MD  ondansetron (ZOFRAN) 8 MG tablet Take 1 tablet (8 mg total) by mouth every 8 (eight) hours as needed for nausea or vomiting. 07/26/15   Ladell Pier, MD  prochlorperazine (COMPAZINE) 10 MG tablet TAKE 1 TABLET (10 MG TOTAL) BY MOUTH EVERY 6 (SIX) HOURS AS NEEDED FOR NAUSEA OR VOMITING. 09/23/15   Ladell Pier, MD  traMADol (ULTRAM) 50 MG tablet Take 1 tablet (50 mg total) by mouth every 6 (six) hours as needed for moderate pain. 10/11/15   Robbie Lis, MD    Current Facility-Administered Medications  Medication Dose Route Frequency Provider Last Rate Last Dose  . 0.9 %  sodium chloride infusion   Intravenous Continuous Belkys A Regalado, MD 100 mL/hr at 10/21/15 1055    . antiseptic oral rinse (CPC / CETYLPYRIDINIUM CHLORIDE 0.05%) solution 7 mL  7 mL Mouth Rinse BID Gennaro Africa, MD   7 mL at 10/21/15 1000  . diphenoxylate-atropine (LOMOTIL) 2.5-0.025 MG per tablet 1-2 tablet  1-2 tablet Oral QID PRN Ladell Pier, MD      . levothyroxine (SYNTHROID, LEVOTHROID) tablet 75 mcg  75 mcg Oral QAC breakfast Annita Brod, MD   75 mcg at 10/21/15 [phone]  . loperamide (IMODIUM) capsule 2 mg  2 mg Oral TID Annita Brod, MD   2 mg at 10/21/15 1000  . loperamide (IMODIUM) capsule 2 mg  2 mg Oral PRN Annita Brod, MD      . morphine 2 MG/ML injection 2 mg  2 mg Intravenous Q3H PRN Gennaro Africa, MD   2 mg at 10/21/15 1055  . ondansetron (ZOFRAN) injection 4 mg  4 mg Intravenous Q6H PRN  Gennaro Africa, MD   4 mg at 10/20/15 2246  . potassium chloride SA (K-DUR,KLOR-CON) CR tablet 40 mEq  40 mEq Oral Q4H Belkys A Regalado, MD      . Rivaroxaban (XARELTO) tablet 15 mg  15 mg Oral BID WC Annita Brod, MD   15 mg at 10/21/15 0842  . [START ON 10/29/2015] rivaroxaban (XARELTO) tablet 20 mg  20 mg Oral Q supper Annita Brod, MD        Allergies as of 10/18/2015  . (No Known Allergies)    Family History  Problem Relation Age of Onset  . Heart failure Mother   . Heart attack Mother   . Leukemia Father   . Colon cancer Neg Hx     Social History   Social History  . Marital Status: Married    Spouse Name: N/A  . Number of Children: N/A  . Years of Education: N/A   Occupational History  . Not on file.   Social History Main Topics  . Smoking status: Never Smoker   . Smokeless tobacco: Never Used  . Alcohol Use: 0.0 oz/week    0 Standard drinks or equivalent per week     Comment: social drinker  . Drug Use: No  . Sexual Activity: Yes   Other Topics Concern  . Not on file   Social History Narrative   Lives with wife at home    Review of Systems: Gen: Denies any fever, chills, sweats CV: Denies chest pain, angina, palpitations, syncope, orthopnea, PND, peripheral edema, and claudication. Resp: Denies dyspnea at rest, dyspnea with exercise, cough, sputum, wheezing, coughing up blood, and pleurisy. GI: Denies vomiting blood, jaundice, and fecal incontinence.   Denies dysphagia or odynophagia. GU : Denies urinary burning, blood in urine, urinary frequency, urinary hesitancy, nocturnal urination, and urinary incontinence. MS: Denies joint pain, limitation of movement, and swelling, stiffness, low back pain, extremity pain. Denies muscle weakness, cramps, atrophy.  Derm: Denies rash, itching, dry skin, hives, moles, warts, or unhealing ulcers.  Psych: Denies  memory loss, suicidal ideation, hallucinations, paranoia, and confusion. Heme: Denies bruising,  bleeding, and enlarged lymph nodes. Neuro:  Denies any headaches, dizziness, paresthesias.   Physical Exam: Vital signs in last 24 hours: Temp:  [97.4 F (36.3 C)-97.9 F (36.6 C)] 97.9 F (36.6 C) (10/21 1437) Pulse Rate:  [66-85] 66 (10/21 1437) Resp:  [17-18] 17 (10/21 1437) BP: (105-124)/(68-82) 118/79 mmHg (10/21 1437) SpO2:  [99 %-100 %] 100 % (10/21 1437) Last BM Date: 10/20/15 General:   Alert,  Well-developed,pleasant and cooperative in NAD Head:  Normocephalic and atraumatic. Eyes:  Sclera clear, no icterus.  Conjunctiva pink. Ears:  Normal auditory acuity. Nose:  No deformity, discharge,  or lesions. Mouth:  No deformity or lesions.   Neck:  Supple; no masses or thyromegaly. Lungs:  Clear throughout to auscultation.   No wheezes, crackles, or rhonchi.  Heart:  Regular rate and rhythm; no murmurs, clicks, rubs,  or gallops. Abdomen:  Soft, mild to moderate distention, nontender BS active,nonpalp mass or hsm.   Rectal:  Deferred , rectal tube in place Msk:  Symmetrical without gross deformities. . Pulses:  Normal pulses noted. Extremities: 2-3+ pitting edema bilat eral lower extremities Neurologic:  Alert and  oriented x4;  grossly normal neurologically. Skin: Bed sore and buttocks, bruises on her arms and hands. Psych:  Alert and cooperative. Normal mood and affect.  Intake/Output from previous day: 10/20 0701 - 10/21 0700 In: 240 [P.O.:240] Out: 1225 [Urine:825; Stool:400] Intake/Output this shift: Total I/O In: -  Out: 350 [Urine:350]  Lab Results:  Recent Labs  10/19/15 0355 10/20/15 0730 10/21/15 0430  WBC 16.1* 7.4 6.9  HGB 10.2* 9.2* 9.0*  HCT 31.9* 28.6* 28.8*  PLT 335 244 246   BMET  Recent Labs  10/19/15 0355 10/20/15 0730 10/21/15 0430  NA 133* 135 137  K 3.1* 2.8* 2.9*  CL 105 108 109  CO2 17* 19* 19*  GLUCOSE 116* 132* 113*  BUN 40* 40* 38*  CREATININE 1.69* 1.57* 1.18  CALCIUM 8.0* 7.9* 7.8*   LFT  Recent Labs   10/19/15 0355  PROT 5.8*  ALBUMIN 1.8*  AST 22  ALT 13*  ALKPHOS 293*  BILITOT 2.0*    Studies/Results:    Vitals     Height Weight BMI (Calculated)    6\' 4"  (1.93 m) 240 lb 11.9 oz (109.2 kg) 29.4      Interpretation Summary     CLINICAL DATA: Acute onset of nausea, vomiting and diarrhea. Recently diagnosed pulmonary embolus. Status post chemotherapy for colorectal adenocarcinoma. Initial encounter.  EXAM: CT ABDOMEN AND PELVIS WITHOUT CONTRAST  TECHNIQUE: Multidetector CT imaging of the abdomen and pelvis was performed following the standard protocol without IV contrast.  COMPARISON: CT of the abdomen and pelvis performed 07/13/2015  FINDINGS: Scattered nodules of varying size are noted at the lung bases, compatible with metastatic disease. Trace bilateral pleural effusions are seen, right greater than left.  Extensive masses are again noted throughout the liver, measuring up to 14.6 cm in size. These appear mildly worsened from the prior study. Minimally decreased attenuation at the inferior aspect of the spleen may also reflect metastatic disease. Cholelithiasis is noted; vicarious contrast excretion is seen in the gallbladder. The gallbladder is not well assessed due to surrounding ascites.  Moderate to large volume ascites is noted throughout the abdomen and pelvis. This is increased from the prior study. There is increasing diffuse omental edema, with slight nodularity, raising question for mild omental caking.  The pancreas is unremarkable in appearance. A 1.9 cm nodule is noted at the right adrenal gland. The left adrenal gland is unremarkable in appearance.  The kidneys are unremarkable in appearance. There is no evidence of hydronephrosis. No renal or ureteral stones are seen. Nonspecific perinephric stranding is noted bilaterally.  There is distention of the cecum to 10.5 cm; the colon is largely filled with fluid. There is  mildly irregular wall thickening along the descending colon, and a persistent 5.2 x 3.3 cm mass is noted at the mid sigmoid colon. Findings likely reflect some degree of dysmotility.  The small bowel  is unremarkable in appearance. The stomach is within normal limits. No acute vascular abnormalities are seen. Mild scattered calcification is noted along the abdominal aorta and its branches.  The bladder is mildly distended and grossly unremarkable. The prostate remains normal in size. No inguinal lymphadenopathy is seen.  Diffuse soft tissue edema about the abdomen and pelvis likely reflects mild anasarca.  No acute osseous abnormalities are identified.  IMPRESSION: 1. Distention of the cecum to 10.5 cm. The colon is largely filled with fluid. Mildly irregular wall thickening along the descending colon, and persistent 5.2 x 3.3 cm mass at the mid sigmoid colon. Bowel distention likely reflects some degree of dysmotility. 2. Worsening moderate to large volume ascites within the abdomen and pelvis. Increasing diffuse omental edema, with slight nodularity, raising question for mild omental caking. 3. Extensive masses throughout the liver, measuring up to 14.6 cm in size, appear mildly worsened from the prior study. Minimally decreased attenuation at the inferior aspect of the spleen may also reflect metastatic disease. 4. 1.9 cm right adrenal nodule noted; this has decreased mildly in size. 5. Scattered nodules of varying size at the lung bases, more prominent than on the prior study and compatible with metastatic disease. Trace bilateral pleural effusions, right greater than left. 6. Cholelithiasis; gallbladder otherwise grossly unremarkable. 7. Mild scattered calcification along the abdominal aorta and its branches. 8. Diffuse soft tissue edema about the abdomen and pelvis likely reflects mild anasarca.   Electronically Signed  By: Garald Balding M.D.  On:  10/18/2015 22:50     IMPRESSION/PLAN:  59 year old male with metastatic colon cancer admitted with diarrhea of several weeks duration that started d during chemotherapy. C. difficile, ova and parasites, Giardia and cryptosporidium negative. CT reveals irregular wall thickening along the descending colon and a 5.2 x 3.3 cm mass at the midst sigmoid. Distention of the cecum to 10.5 cm. He also has large volume ascites within the abdomen with diffuse omental edema with nodularity raising the question for omental caking. Diarrhea may be in part due to chemotherapy but? Overflow diarrhea from mass/partial obstruction. Would discontinue Imodium and Lomotil and obtain abdominal films to reevaluate cecal distention.  check C. difficile PCR as quick scan was done.  Keep nothing by mouth. Pending results of films, may consider surgical consult for near her impending obstruction.  Hypotension. Was likely due to severe diarrhea. Atrial fibrillation. On anticoagulation. History of acute skeletal PE. On Xarelto Hypokalemia. Has received potassium replacement earlier today.  Will review with attending [place] to further recommendations.  Hvozdovic, Deloris Ping 10/21/2015,  Pager [phone]  Mon-Fri 8a-5p [phone] after 5p, weekends, holidays  GI ATTENDING  History,labs,x-rays reviewed. Agree with comprehensive consultation note as outlined above.Complicated and unfortunate gentleman with advanced widespread metastatic colon cancer. Diarrhea most likely secondary to chemotherapy. High grade sigmoid colon cancer worrisome for impending obstruction, but follow up films tonight look ok. Appears to have intraperitoneal disease in addition to liver and porta hepatis mets. Recommend IV hydration, correction of electrolytes and diet as tolerated. Low dose antidiarrheals ok since follow up films look ok. If colonic obstruction were to develop, may need palliative diverting colostomy .Not sure how much real benefit, at this  point, to be derived from chemotherapy (would defer to Dr. Benay Spice). Would strongly recommend palliative and comfort care measures.  Docia Chuck. Geri Seminole., M.D. [facility] Division of Gastroenterology

## 2015-10-22 DIAGNOSIS — R601 Generalized edema: Secondary | ICD-10-CM

## 2015-10-22 DIAGNOSIS — R935 Abnormal findings on diagnostic imaging of other abdominal regions, including retroperitoneum: Secondary | ICD-10-CM | POA: Insufficient documentation

## 2015-10-22 LAB — STOOL CULTURE

## 2015-10-22 LAB — NOROVIRUS GROUP 1 & 2 BY PCR, STOOL
Norovirus 1 by PCR: NEGATIVE
Norovirus 2  by PCR: NEGATIVE

## 2015-10-22 MED ORDER — SACCHAROMYCES BOULARDII 250 MG PO CAPS
250.0000 mg | ORAL_CAPSULE | Freq: Two times a day (BID) | ORAL | Status: DC
Start: 2015-10-22 — End: 2015-10-24
  Administered 2015-10-22 – 2015-10-23 (×4): 250 mg via ORAL
  Filled 2015-10-22 (×5): qty 1

## 2015-10-22 MED ORDER — SODIUM CHLORIDE 0.9 % IJ SOLN
10.0000 mL | INTRAMUSCULAR | Status: DC | PRN
  Administered 2015-10-23: 20 mL
  Administered 2015-10-24 – 2015-11-01 (×7): 10 mL
  Filled 2015-10-22 (×8): qty 40

## 2015-10-22 MED ORDER — METRONIDAZOLE 250 MG PO TABS
250.0000 mg | ORAL_TABLET | Freq: Four times a day (QID) | ORAL | Status: DC
  Administered 2015-10-22 – 2015-10-23 (×7): 250 mg via ORAL
  Filled 2015-10-22 (×10): qty 1

## 2015-10-22 MED ORDER — FUROSEMIDE 40 MG PO TABS
40.0000 mg | ORAL_TABLET | Freq: Two times a day (BID) | ORAL | Status: DC
  Administered 2015-10-22 – 2015-10-23 (×4): 40 mg via ORAL
  Filled 2015-10-22 (×4): qty 1

## 2015-10-22 NOTE — Evaluation (Signed)
Physical Therapy Evaluation Patient Details Name: Jaime [name] MRN: [id number] DOB: [birth year] Today's Date: 10/22/2015   History of Present Illness  Patient is a 59 year old male with history of stage IV colorectal adeocarcinoma diagnosed in July 2016 s/p 5 cycles of Chemotherapy, last on 7/90/24 that was complicated by recurrent diarrhea, nausea and vomiting with a recent diagnosis of DVT/PE on 10/11/15. He said his diarrhea has been getting worse since last night with profuse watery diarrhea that has not been associated with any abdominal pain, hematochezia, tenesmus, nausea or vomiting. But he reported loss of appetite/anorexia for  few days. . No other complaints. He was seen today by Dr.Sherril (oncology) and was found to be hypotensive with poor response to fluid in clinic. He was sent to the ER for further workup 10/18/15. C.diff PCR was   Clinical Impression  PATIENT WAS eager to begin mobility today. Still c/o discomfort from removal of the flexiseal. Patient will benefit from PT to address problems listed below.    Follow Up Recommendations SNF;Supervision/Assistance - 24 hour    Equipment Recommendations       Recommendations for Other Services OT consult     Precautions / Restrictions Precautions Precautions: Fall      Mobility  Bed Mobility Overal bed mobility: Needs Assistance Bed Mobility: Supine to Sit;Sit to Supine     Supine to sit: Mod assist Sit to supine: Max assist;+2 for physical assistance;+2 for safety/equipment   General bed mobility comments: assist for bil LEs onto bed. Increased time. VCs safety, technique, hand placement  Transfers Overall transfer level: Needs assistance Equipment used: Rolling walker (2 wheeled) Transfers: Sit to/from Stand Sit to Stand: Mod assist;+2 physical assistance;+2 safety/equipment         General transfer comment: Mod assist  +2 from lraiased bed x 2 trials. Stood x 10 secs at most each. forward flexed  posture, cues to push down through the RW, tends to pull up on it.Cues for hand and for safety, technique, hand/foot placement. Increased time. stand pivot from [facility] to bed with rW  Ambulation/Gait                Stairs            Wheelchair Mobility    Modified Rankin (Stroke Patients Only)       Balance Overall balance assessment: Needs assistance Sitting-balance support: No upper extremity supported;Feet supported Sitting balance-Leahy Scale: Fair     Standing balance support: Bilateral upper extremity supported Standing balance-Leahy Scale: Poor                               Pertinent Vitals/Pain Pain Assessment: Faces Faces Pain Scale: Hurts even more Pain Location: rectum post flexiseal removal Pain Intervention(s): Limited activity within patient's tolerance    Home Living Family/patient expects to be discharged to:: Skilled nursing facility Living Arrangements: Spouse/significant other Available Help at Discharge: Family;Available 24 hours/day Type of Home: House Home Access: Stairs to enter;Ramped entrance Entrance Stairs-Rails: Right;Left Entrance Stairs-Number of Steps: 2 Home Layout: One level Home Equipment: Walker - 2 wheels;Cane - single point;Bedside commode;Shower seat      Prior Function           Comments: has been at [facility] x 1 week     Hand Dominance        Extremity/Trunk Assessment   Upper Extremity Assessment: Generalized weakness  Lower Extremity Assessment: Generalized weakness;RLE deficits/detail;LLE deficits/detail RLE Deficits / Details: edema LLE Deficits / Details: edema  Cervical / Trunk Assessment: Kyphotic  Communication      Cognition Arousal/Alertness: Awake/alert Behavior During Therapy: WFL for tasks assessed/performed;Flat affect Overall Cognitive Status: Within Functional Limits for tasks assessed                      General Comments      Exercises         Assessment/Plan    PT Assessment Patient needs continued PT services  PT Diagnosis Generalized weakness;Difficulty walking   PT Problem List Decreased strength;Decreased range of motion;Decreased activity tolerance;Decreased balance;Decreased mobility;Decreased knowledge of precautions;Decreased safety awareness;Decreased knowledge of use of DME;Pain  PT Treatment Interventions DME instruction;Gait training;Functional mobility training;Therapeutic activities;Patient/family education;Balance training;Therapeutic exercise   PT Goals (Current goals can be found in the Care Plan section) Acute Rehab PT Goals Patient Stated Goal: to get stronger PT Goal Formulation: With patient/family Time For Goal Achievement: 11/05/15 Potential to Achieve Goals: Good    Frequency Min 3X/week   Barriers to discharge        Co-evaluation               End of Session   Activity Tolerance: Patient limited by fatigue Patient left: in bed;with call bell/phone within reach;with bed alarm set;with family/visitor present Nurse Communication: Mobility status         Time: 1341-1420 PT Time Calculation (min) (ACUTE ONLY): 39 min   Charges:   PT Evaluation $Initial PT Evaluation Tier I: 1 Procedure PT Treatments $Therapeutic Exercise: 8-22 mins $Therapeutic Activity: 8-22 mins   PT G Codes:        Claretha Cooper 10/22/2015, 3:32 PM Tresa Endo PT [phone]

## 2015-10-22 NOTE — Progress Notes (Signed)
IP PROGRESS NOTE  Subjective:   The diarrhea has slowed significantly. No other complaint.  Objective: Vital signs in last 24 hours: Blood pressure 101/64, pulse 91, temperature 97.6 F (36.4 C), temperature source Oral, resp. rate 20, height 6\' 4"  (1.93 m), weight 240 lb 11.9 oz (109.2 kg), SpO2 100 %.  Intake/Output from previous day: 2023/11/15 0701 - 10/22 0700 In: 1925 [P.O.:50; I.V.:1875] Out: 875 [Urine:875]  Physical Exam:  HEENT: No thrush Lungs: Clear anteriorly Cardiac: Regular rate and rhythm Abdomen: Markedly distended, nontender Extremities: Pitting edema throughout the left greater than right leg   Portacath/PICC-without erythema  Lab Results:  Recent Labs  10/20/15 0730 Nov 15, 2015 0430  WBC 7.4 6.9  HGB 9.2* 9.0*  HCT 28.6* 28.8*  PLT 244 246    BMET  Recent Labs  10/20/15 0730 Nov 15, 2015 0430  NA 135 137  K 2.8* 2.9*  CL 108 109  CO2 19* 19*  GLUCOSE 132* 113*  BUN 40* 38*  CREATININE 1.57* 1.18  CALCIUM 7.9* 7.8*    Studies/Results: Dg Abd 2 Views  November 15, 2015  CLINICAL DATA:  Abdominal distension, non gaseous, hypertension, diabetes mellitus, metastatic carcinoma of unknown primary EXAM: ABDOMEN - 2 VIEW COMPARISON:  CT abdomen and pelvis 10/18/2015 FINDINGS: Food debris and gas within stomach. Stool and gas throughout nondistended colon. CBD stent. Probable calcified gallstones projecting over RIGHT twelfth rib. Air-filled upper normal caliber small bowel loops mid abdomen. No bowel wall thickening or free intraperitoneal air. Degenerative disc disease changes thoracolumbar spine. Osseous demineralization. No urinary tract calcifications. IMPRESSION: CBD stent. Probable calcified gallstones. Nonobstructive bowel gas pattern. Electronically Signed   By: Lavonia Dana M.D.   On: 2015-11-15 17:56    Medications: I have reviewed the patient's current medications.  Assessment/Plan: 1. 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, status post a  colonoscopy 07/21/2015 confirming a sigmoid colon mass  Staging CT scans 07/13/2015 and 07/14/2015 confirmed extensive liver metastases, lung nodules, a right pleural effusion, and porta hepatis lymphadenopathy  Cycle 1 FOLFOX (chemotherapy doses reduced and bolus 5-FU eliminated) 08/02/2015  Cycle 2 FOLFOX (chemotherapy doses reduced and bolus 5-FU eliminated) 08/16/2015  Cycle 3 FOLFOX (Avastin added, bolus 5-FU introduced, and infusional 5-FU dose escalated) 08/30/2015  Cycle 4 FOLFOX plus Avastin 09/13/2015  Cycle 5 held on 09/27/2015 due to mild neutropenia, recent diarrhea.  CT of the chest 10/02/2015, compared to a CT from 07/13/2015-no significant change in lung nodules or liver metastases  Diagnostic/therapeutic paracentesis 10/05/2015-negative cytology  CEA lower on 10/11/2015  CT 10/18/2015 with mild progression of liver metastases and lung lesions  2. Obstructive jaundice, likely secondary to porta hepatis lymphadenopathy, status post placement of a bile duct stent on 07/14/2015  3. Microcytic anemia secondary to the colon mass-improved  4. Diabetes  5. Failure to thrive secondary to metastatic colon cancer  6. Port-A-Cath placement 08/01/2015  7. Nausea following cycle 1 and cycle 2 FOLFOX-emend added with cycle 3  8. Diarrhea following cycle 4 FOLFOX/Avastin.  9. Neutropenia following cycle 4 FOLFOX/Avastin. Neulasta to be added with cycle 5.  10. Admission 10/02/2015 with bilateral DVTs and pulmonary emboli-maintained on Xarelto 10/07/2015  11. Diarrhea-C. difficile negative, etiology unclear, improved  12. Hypotension-likely secondary to dehydration, improved  He is alert. The diarrhea has slowed. The etiology of the diarrhea remains unclear. This may be related to overflow diarrhea from the sigmoid colon mass, though he does not appear to have a significant clinical obstruction. I do not think the diarrhea secondary to chemotherapy. I  discussed CODE STATUS with him again this morning. He would like me to return and discuss this when his wife is present later this morning. I explained he will not be able to receive further chemotherapy if his performance status does not improve.  Recommendations: 1. Continue antidiarrhea agents as needed 2.  out of bed, begin physical therapy 3. Decrease IV fluids, begin gentle diuresis for anasarca 4. Continue goals of care discussion when his wife is present  LOS: 4 days   [place], [place]  10/22/2015, 8:28 AM

## 2015-10-22 NOTE — Progress Notes (Signed)
Patient ID: Jaime [name], male   DOB: Mar 12, 1956, 59 y.o.   MRN: [id number]    Progress Note   Subjective  Feels Ok- no pain, wants rectal tube out but afraid he will develop diarrhea again.  GI path panel negative Rectal tube- very little liquid yellow stool in bag KUB- non obstructive gas pattern    Objective   Vital signs in last 24 hours: Temp:  [97.6 F (36.4 C)-97.9 F (36.6 C)] 97.6 F (36.4 C) (10/22 0524) Pulse Rate:  [66-91] 91 (10/22 0524) Resp:  [17-20] 20 (10/22 0524) BP: (91-118)/(59-79) 101/64 mmHg (10/22 0524) SpO2:  [100 %] 100 % (10/22 0524) Last BM Date: 10/20/15 General:    Chronically ill appearing WM in NAD Heart:  Regular rate and rhythm; no murmurs Lungs: Respirations even and unlabored, lungs CTA bilaterally Abdomen:  Soft, nontender and nondistended, large. Normal bowel sounds. Extremities:  Without edema. Neurologic:  Alert and oriented,  grossly normal neurologically. Psych:  Cooperative. Normal mood and affect.  Intake/Output from previous day: 11-16-23 0701 - 10/22 0700 In: 1925 [P.O.:50; I.V.:1875] Out: 875 [Urine:875] Intake/Output this shift:    Lab Results:  Recent Labs  10/20/15 0730 16-Nov-2015 0430  WBC 7.4 6.9  HGB 9.2* 9.0*  HCT 28.6* 28.8*  PLT 244 246   BMET  Recent Labs  10/20/15 0730 Nov 16, 2015 0430  NA 135 137  K 2.8* 2.9*  CL 108 109  CO2 19* 19*  GLUCOSE 132* 113*  BUN 40* 38*  CREATININE 1.57* 1.18  CALCIUM 7.9* 7.8*   LFT No results for input(s): PROT, ALBUMIN, AST, ALT, ALKPHOS, BILITOT, BILIDIR, IBILI in the last 72 hours. PT/INR No results for input(s): LABPROT, INR in the last 72 hours.  Studies/Results: Dg Abd 2 Views  16-Nov-2015  CLINICAL DATA:  Abdominal distension, non gaseous, hypertension, diabetes mellitus, metastatic carcinoma of unknown primary EXAM: ABDOMEN - 2 VIEW COMPARISON:  CT abdomen and pelvis 10/18/2015 FINDINGS: Food debris and gas within stomach. Stool and gas throughout  nondistended colon. CBD stent. Probable calcified gallstones projecting over RIGHT twelfth rib. Air-filled upper normal caliber small bowel loops mid abdomen. No bowel wall thickening or free intraperitoneal air. Degenerative disc disease changes thoracolumbar spine. Osseous demineralization. No urinary tract calcifications. IMPRESSION: CBD stent. Probable calcified gallstones. Nonobstructive bowel gas pattern. Electronically Signed   By: Lavonia Dana M.D.   On: Nov 16, 2015 17:56       Assessment / Plan:    #1  59 yo  Male with widely metastatic colon cancer , undergoing chemo but last chemo  5 weeks ago - with severe diarrhea on admit with dehydration . Diarrhea has significantly decreased   large colon tumor present which may be causing at least some partial obstruction though plain films ok. He also has ascites ,probable omental disease Infectious workup negative Pt has been in a rehab facility-high risk for cdiff though studies negative #2 recent PE #3 hypokalemia #4  Afib #5 AKI #6 malnutrition  Plan; start soft low residue diet D/C rectal tube  Start course of empiric Flagyl 250 qid  Start probiotic -use Florastor BID here Monitor- hold antidiarrheals today  see how he does.     Principal Problem:   Hypovolemic shock (HCC) Active Problems:   Malnutrition of moderate degree (HCC)   Colon cancer (HCC)   PAF (paroxysmal atrial fibrillation) (HCC)   Acute saddle pulmonary embolism with acute cor pulmonale (HCC)   Chronic diastolic congestive heart failure (HCC)   Anemia of chronic disease  AKI (acute kidney injury) ([place])   Hypotension   Diarrhea   Hypothyroidism   Acute kidney injury ([place])   Pressure ulcer     LOS: 4 days   Amy [place]  10/22/2015, 8:49 AM  GI ATTENDING  Interval history data reviewed. Agree with interval progress note as outlined. Continue with supportive measures. Treatable entities being treated.  Docia Chuck. Geri [place]., M.D. [facility] Division of Gastroenterology

## 2015-10-22 NOTE — Progress Notes (Signed)
PROGRESS NOTE  Jaime [name] MBT:[id number] DOB: 1956-08-27 DOA: 10/18/2015 PCP: Velna Hatchet, MD  HPI/Recap of past 39 hours: 59 year old male with past medical history of paroxysmal atrial fibrillation, colon cancer status post chemotherapy and pulmonary embolus admitted on 10/18 for hypovolemic shock that has been ongoing for the past few weeks secondary to continued diarrhea. Initial blood pressure with a systolic of 60, but lactic acid level normal. Patient noted to have elevated white blood cell count of 24 and a creatinine of 1.7. Started on IV fluids plus Zosyn and placed in stepdown unit. C. difficile cultures negative.  Transferred out of unit by 10/19.  Patient continues to improve. Finally starting to have much less diarrhea. Rectal tube removed. Working well with physical therapy.   Assessment/Plan: Principal Problem:   Hypovolemic shock (HCC) causing hypotension and secondary acute kidney injury: No evidence of sepsis given normal lactic acidosis. Hypotension secondary to diarrhea and continued Lasix use. Creatinine now normalized. IV fluids discontinued and Lasix started. Active Problems:   Malnutrition of moderate degree (HCC)   Colon cancer [facility]): Being followed by oncology, no plans for further chemotherapy unless functional status improves   PAF (paroxysmal atrial fibrillation) ([place]): Currently in normal sinus rhythm, already on anticoagulation   Acute saddle pulmonary embolism with acute cor pulmonale ([place]): Have restarted xarelto   Chronic diastolic congestive heart failure ([place]): IV fluids now discontinued and rate Lasix restarted   Anemia of chronic disease: Secondary to cancer. Continue iron     Diarrhea: Unclear etiology. No signs of infection, still cultures and C. difficile negative. Suspect maybe enteritis? Leukocytosis now normal. Stopped antibiotics after first day. Much improved  Edema: Have restarted Lasix  Code Status: Full code  Family  Communication: Left message with wife  Disposition Plan: Back to skilled nursing Monday or Tuesday   Consultants:  Oncology  Gastroenterology  Procedures:  None  Antibiotics:  Zosyn 10/18-10/19   Objective: BP 111/70 mmHg  Pulse 92  Temp(Src) 97.4 F (36.3 C) (Oral)  Resp 20  Ht 6\' 4"  (1.93 m)  Wt 109.2 kg (240 lb 11.9 oz)  BMI 29.32 kg/m2  SpO2 100%  Intake/Output Summary (Last 24 hours) at 10/22/15 1521 Last data filed at 10/22/15 0600  Gross per 24 hour  Intake   1325 ml  Output    525 ml  Net    800 ml   Filed Weights   10/18/15 2300  Weight: 109.2 kg (240 lb 11.9 oz)    Exam:   General:  Alert and oriented 3, fatigued  Cardiovascular: Regular rate and rhythm, S1-S2  Respiratory: Clear to auscultation bilaterally  Abdomen: Soft, nontender, nondistended, moderate bowel sounds  Musculoskeletal: 2-3+ pitting edema from the knees down  Data Reviewed: Basic Metabolic Panel:  Recent Labs Lab 10/18/15 0843 10/19/15 0355 10/20/15 0730 10/21/15 0430  NA 134* 133* 135 137  K 3.7 3.1* 2.8* 2.9*  CL  --  105 108 109  CO2 20* 17* 19* 19*  GLUCOSE 126 116* 132* 113*  BUN 36.8* 40* 40* 38*  CREATININE 1.7* 1.69* 1.57* 1.18  CALCIUM 8.6 8.0* 7.9* 7.8*   Liver Function Tests:  Recent Labs Lab 10/18/15 0843 10/19/15 0355  AST 26 22  ALT 14 13*  ALKPHOS 367* 293*  BILITOT 2.57* 2.0*  PROT 6.2* 5.8*  ALBUMIN 1.8* 1.8*   No results for input(s): LIPASE, AMYLASE in the last 168 hours. No results for input(s): AMMONIA in the last 168 hours. CBC:  Recent Labs Lab  10/18/15 0842 10/19/15 0355 10/20/15 0730 10/21/15 0430  WBC 23.2* 16.1* 7.4 6.9  NEUTROABS 19.4* 13.6*  --   --   HGB 11.0* 10.2* 9.2* 9.0*  HCT 34.1* 31.9* 28.6* 28.8*  MCV 87.7 89.1 88.8 88.9  PLT 402* 335 244 246   Cardiac Enzymes:   No results for input(s): CKTOTAL, CKMB, CKMBINDEX, TROPONINI in the last 168 hours. BNP (last 3 results) No results for input(s): BNP  in the last 8760 hours.  ProBNP (last 3 results) No results for input(s): PROBNP in the last 8760 hours.  CBG:  Recent Labs Lab 10/19/15 1053 10/19/15 2221 10/20/15 0750 10/20/15 1210 10/21/15 0757  GLUCAP 114* 139* 148* 182* 118*    Recent Results (from the past 240 hour(s))  C difficile quick scan w PCR reflex     Status: None   Collection Time: 10/18/15 11:32 AM  Result Value Ref Range Status   C Diff antigen NEGATIVE NEGATIVE Final   C Diff toxin NEGATIVE NEGATIVE Final   C Diff interpretation Negative for toxigenic C. difficile  Final  Stool culture     Status: None (Preliminary result)   Collection Time: 10/18/15 11:32 AM  Result Value Ref Range Status   Specimen Description STOOL  Final   Special Requests NONE  Final   Culture   Final    NO SUSPICIOUS COLONIES, CONTINUING TO HOLD Performed at [facility]    Report Status PENDING  Incomplete  Ova and parasite examination     Status: None   Collection Time: 10/18/15 11:32 AM  Result Value Ref Range Status   Specimen Description STOOL  Final   Special Requests NONE  Final   Ova and parasites   Final    NO OVA OR PARASITES SEEN Performed at [facility]    Report Status 10/19/2015 FINAL  Final  MRSA PCR Screening     Status: None   Collection Time: 10/18/15 11:12 PM  Result Value Ref Range Status   MRSA by PCR NEGATIVE NEGATIVE Final    Comment:        The GeneXpert MRSA Assay (FDA approved for NASAL specimens only), is one component of a comprehensive MRSA colonization surveillance program. It is not intended to diagnose MRSA infection nor to guide or monitor treatment for MRSA infections.   Culture, blood (routine x 2)     Status: None (Preliminary result)   Collection Time: 10/18/15 11:30 PM  Result Value Ref Range Status   Specimen Description BLOOD RIGHT ARM  Final   Special Requests BOTTLES DRAWN AEROBIC AND ANAEROBIC 5ML  Final   Culture   Final    NO GROWTH 3 DAYS Performed  at [facility]    Report Status PENDING  Incomplete  Culture, blood (routine x 2)     Status: None (Preliminary result)   Collection Time: 10/19/15  3:55 AM  Result Value Ref Range Status   Specimen Description BLOOD LEFT ANTECUBITAL  Final   Special Requests BOTTLES DRAWN AEROBIC ONLY 5ML  Final   Culture   Final    NO GROWTH 3 DAYS Performed at [facility]    Report Status PENDING  Incomplete     Studies: Dg Abd 2 Views  10/21/2015  CLINICAL DATA:  Abdominal distension, non gaseous, hypertension, diabetes mellitus, metastatic carcinoma of unknown primary EXAM: ABDOMEN - 2 VIEW COMPARISON:  CT abdomen and pelvis 10/18/2015 FINDINGS: Food debris and gas within stomach. Stool and gas throughout nondistended colon.  CBD stent. Probable calcified gallstones projecting over RIGHT twelfth rib. Air-filled upper normal caliber small bowel loops mid abdomen. No bowel wall thickening or free intraperitoneal air. Degenerative disc disease changes thoracolumbar spine. Osseous demineralization. No urinary tract calcifications. IMPRESSION: CBD stent. Probable calcified gallstones. Nonobstructive bowel gas pattern. Electronically Signed   By: Lavonia Dana M.D.   On: 10/21/2015 17:56    Scheduled Meds: . antiseptic oral rinse  7 mL Mouth Rinse BID  . furosemide  40 mg Oral BID  . levothyroxine  75 mcg Oral QAC breakfast  . metroNIDAZOLE  250 mg Oral 4 times per day  . Rivaroxaban  15 mg Oral BID WC  . [START ON 10/29/2015] rivaroxaban  20 mg Oral Q supper  . saccharomyces boulardii  250 mg Oral BID    Continuous Infusions:     Time spent: 15 minutes  [facility] Pager [phone]. If 7PM-7AM, please contact night-coverage at [email or web address], password [facility] 10/22/2015, 3:21 PM  LOS: 4 days

## 2015-10-23 LAB — BASIC METABOLIC PANEL
Anion gap: 8 (ref 5–15)
BUN: 34 mg/dL — AB (ref 6–20)
CO2: 18 mmol/L — ABNORMAL LOW (ref 22–32)
CREATININE: 0.97 mg/dL (ref 0.61–1.24)
Calcium: 7.9 mg/dL — ABNORMAL LOW (ref 8.9–10.3)
Chloride: 112 mmol/L — ABNORMAL HIGH (ref 101–111)
GFR calc Af Amer: >60 mL/min (ref >60)
GLUCOSE: 122 mg/dL — AB (ref 65–99)
POTASSIUM: 2.6 mmol/L — AB (ref 3.5–5.1)
SODIUM: 138 mmol/L (ref 135–145)

## 2015-10-23 LAB — MAGNESIUM: Magnesium: 1.6 mg/dL — ABNORMAL LOW (ref 1.7–2.4)

## 2015-10-23 MED ORDER — POTASSIUM CHLORIDE CRYS ER 20 MEQ PO TBCR: 30.0000 meq | EXTENDED_RELEASE_TABLET | Freq: Every day | ORAL | Status: DC

## 2015-10-23 MED ORDER — POTASSIUM CHLORIDE CRYS ER 20 MEQ PO TBCR
40.0000 meq | EXTENDED_RELEASE_TABLET | Freq: Three times a day (TID) | ORAL | Status: AC
  Administered 2015-10-23 (×3): 40 meq via ORAL
  Filled 2015-10-23 (×2): qty 2

## 2015-10-23 NOTE — Progress Notes (Signed)
PROGRESS NOTE  Real Cona HFW:[id number] DOB: October 06, 1956 DOA: 10/18/2015 PCP: Velna Hatchet, MD  HPI/Recap of past 50 hours: 59 year old male with past medical history of paroxysmal atrial fibrillation, colon cancer status post chemotherapy and pulmonary embolus admitted on 10/18 for hypovolemic shock that has been ongoing for the past few weeks secondary to continued diarrhea. Initial blood pressure with a systolic of 60, but lactic acid level normal. Patient noted to have elevated white blood cell count of 24 and a creatinine of 1.7. Started on IV fluids plus Zosyn and placed in stepdown unit. C. difficile cultures negative.  Transferred out of unit by 10/19.  Patient continues to improve. No diarrhea today. Potassium levels depleted and replaced. Oncology spoke with patient and wife yesterday to change CODE STATUS to DO NOT RESUSCITATE.   Assessment/Plan: Principal Problem:   Hypovolemic shock (HCC) causing hypotension and secondary acute kidney injury: No evidence of sepsis given normal lactic acidosis. Hypotension secondary to diarrhea and continued Lasix use. Creatinine now normalized. IV fluids discontinued and Lasix started. Replacing potassium accordingly Active Problems:   Malnutrition of moderate degree (HCC)   Colon cancer [facility]): Being followed by oncology, no plans for further chemotherapy unless functional status improves. Changed to DO NOT RESUSCITATE   PAF (paroxysmal atrial fibrillation) ([place]): Currently in normal sinus rhythm, already on anticoagulation   Acute saddle pulmonary embolism with acute cor pulmonale [facility]): Have restarted xarelto   Chronic diastolic congestive heart failure ([place]): IV fluids now discontinued and rate Lasix restarted   Anemia of chronic disease: Secondary to cancer. Continue iron     Diarrhea: Unclear etiology. No signs of infection, still cultures and C. difficile negative. Suspect maybe enteritis? Leukocytosis now normal. Stopped  antibiotics after first day. Much improved  Edema: Have restarted Lasix, slow response  Code Status: Now DO NOT RESUSCITATE  Family Communication: Left message with wife  Disposition Plan: Back to skilled nursing Monday or Tuesday once better diuresed   Consultants:  Oncology  Gastroenterology  Procedures:  None  Antibiotics:  Zosyn 10/18-10/19   Objective: BP 99/65 mmHg  Pulse 88  Temp(Src) 97.4 F (36.3 C) (Oral)  Resp 18  Ht 6\' 4"  (1.93 m)  Wt 115.6 kg (254 lb 13.6 oz)  BMI 31.03 kg/m2  SpO2 100%  Intake/Output Summary (Last 24 hours) at 10/23/15 1506 Last data filed at 10/23/15 0500  Gross per 24 hour  Intake    720 ml  Output   1100 ml  Net   -380 ml   Filed Weights   10/18/15 2300 10/23/15 0427  Weight: 109.2 kg (240 lb 11.9 oz) 115.6 kg (254 lb 13.6 oz)    Exam:   General:  Alert and oriented 3, no acute distress  Cardiovascular: Regular rate and rhythm, S1-S2  Respiratory: Clear to auscultation bilaterally  Abdomen: Soft, nontender, nondistended, moderate bowel sounds  Musculoskeletal: 3+ pitting edema from the knees down  Data Reviewed: Basic Metabolic Panel:  Recent Labs Lab 10/18/15 0843 10/19/15 0355 10/20/15 0730 10/21/15 0430 10/23/15 1035  NA 134* 133* 135 137 138  K 3.7 3.1* 2.8* 2.9* 2.6*  CL  --  105 108 109 112*  CO2 20* 17* 19* 19* 18*  GLUCOSE 126 116* 132* 113* 122*  BUN 36.8* 40* 40* 38* 34*  CREATININE 1.7* 1.69* 1.57* 1.18 0.97  CALCIUM 8.6 8.0* 7.9* 7.8* 7.9*   Liver Function Tests:  Recent Labs Lab 10/18/15 0843 10/19/15 0355  AST 26 22  ALT 14 13*  ALKPHOS  367* 293*  BILITOT 2.57* 2.0*  PROT 6.2* 5.8*  ALBUMIN 1.8* 1.8*   No results for input(s): LIPASE, AMYLASE in the last 168 hours. No results for input(s): AMMONIA in the last 168 hours. CBC:  Recent Labs Lab 10/18/15 0842 10/19/15 0355 10/20/15 0730 10/21/15 0430  WBC 23.2* 16.1* 7.4 6.9  NEUTROABS 19.4* 13.6*  --   --   HGB 11.0*  10.2* 9.2* 9.0*  HCT 34.1* 31.9* 28.6* 28.8*  MCV 87.7 89.1 88.8 88.9  PLT 402* 335 244 246   Cardiac Enzymes:   No results for input(s): CKTOTAL, CKMB, CKMBINDEX, TROPONINI in the last 168 hours. BNP (last 3 results) No results for input(s): BNP in the last 8760 hours.  ProBNP (last 3 results) No results for input(s): PROBNP in the last 8760 hours.  CBG:  Recent Labs Lab 10/19/15 1053 10/19/15 2221 10/20/15 0750 10/20/15 1210 10/21/15 0757  GLUCAP 114* 139* 148* 182* 118*    Recent Results (from the past 240 hour(s))  C difficile quick scan w PCR reflex     Status: None   Collection Time: 10/18/15 11:32 AM  Result Value Ref Range Status   C Diff antigen NEGATIVE NEGATIVE Final   C Diff toxin NEGATIVE NEGATIVE Final   C Diff interpretation Negative for toxigenic C. difficile  Final  Stool culture     Status: None   Collection Time: 10/18/15 11:32 AM  Result Value Ref Range Status   Specimen Description STOOL  Final   Special Requests NONE  Final   Culture   Final    NO SALMONELLA, SHIGELLA, CAMPYLOBACTER, YERSINIA, OR E.COLI 0157:H7 ISOLATED Performed at [facility]    Report Status 10/22/2015 FINAL  Final  Ova and parasite examination     Status: None   Collection Time: 10/18/15 11:32 AM  Result Value Ref Range Status   Specimen Description STOOL  Final   Special Requests NONE  Final   Ova and parasites   Final    NO OVA OR PARASITES SEEN Performed at [facility]    Report Status 10/19/2015 FINAL  Final  MRSA PCR Screening     Status: None   Collection Time: 10/18/15 11:12 PM  Result Value Ref Range Status   MRSA by PCR NEGATIVE NEGATIVE Final    Comment:        The GeneXpert MRSA Assay (FDA approved for NASAL specimens only), is one component of a comprehensive MRSA colonization surveillance program. It is not intended to diagnose MRSA infection nor to guide or monitor treatment for MRSA infections.   Culture, blood (routine x 2)      Status: None (Preliminary result)   Collection Time: 10/18/15 11:30 PM  Result Value Ref Range Status   Specimen Description BLOOD RIGHT ARM  Final   Special Requests BOTTLES DRAWN AEROBIC AND ANAEROBIC 5ML  Final   Culture   Final    NO GROWTH 4 DAYS Performed at [facility]    Report Status PENDING  Incomplete  Culture, blood (routine x 2)     Status: None (Preliminary result)   Collection Time: 10/19/15  3:55 AM  Result Value Ref Range Status   Specimen Description BLOOD LEFT ANTECUBITAL  Final   Special Requests BOTTLES DRAWN AEROBIC ONLY 5ML  Final   Culture   Final    NO GROWTH 4 DAYS Performed at [facility]    Report Status PENDING  Incomplete     Studies: No results found.  Scheduled Meds: . antiseptic oral rinse  7 mL Mouth Rinse BID  . furosemide  40 mg Oral BID  . levothyroxine  75 mcg Oral QAC breakfast  . metroNIDAZOLE  250 mg Oral 4 times per day  . [START ON 10/24/2015] potassium chloride  30 mEq Oral Daily  . potassium chloride  40 mEq Oral TID  . Rivaroxaban  15 mg Oral BID WC  . [START ON 10/29/2015] rivaroxaban  20 mg Oral Q supper  . saccharomyces boulardii  250 mg Oral BID    Continuous Infusions:     Time spent: 15 minutes  [place] [facility] Pager [phone]. If 7PM-7AM, please contact night-coverage at [email or web address], password [facility] 10/23/2015, 3:06 PM  LOS: 5 days

## 2015-10-23 NOTE — Progress Notes (Signed)
Patient ID: Jaime [name], male   DOB: 05/10/1956, 59 y.o.   MRN: [id number]    Progress Note   Subjective  Discouraged today-no c/o pain, no Bm's since rectal tube out-was able to eat yesterday    Objective   Vital signs in last 24 hours: Temp:  [97.4 F (36.3 C)-98.6 F (37 C)] 98.2 F (36.8 C) (10/23 0427) Pulse Rate:  [92-97] 93 (10/23 0427) Resp:  [18] 18 (10/23 0427) BP: (92-111)/(59-70) 98/60 mmHg (10/23 0513) SpO2:  [98 %-100 %] 98 % (10/23 0427) Weight:  [254 lb 13.6 oz (115.6 kg)] 254 lb 13.6 oz (115.6 kg) (10/23 0427) Last BM Date: 10/22/15 General:  Chronically ill appearing  white male in NAD Heart:  Regular rate and rhythm; no murmurs Lungs: Respirations even and unlabored, lungs CTA bilaterally Abdomen:  Soft, ,more distended today, somewhat tympanitic, nontender, Bs+ Extremities:  LLe edema. Neurologic:  Alert and oriented,  grossly normal neurologically. Psych:  Cooperative. Normal mood and affect.  Intake/Output from previous day: 10/22 0701 - 10/23 0700 In: 720 [P.O.:720] Out: 1100 [Urine:1100] Intake/Output this shift:    Lab Results:  Recent Labs  2015/10/27 0430  WBC 6.9  HGB 9.0*  HCT 28.8*  PLT 246   BMET  Recent Labs  10-27-2015 0430  NA 137  K 2.9*  CL 109  CO2 19*  GLUCOSE 113*  BUN 38*  CREATININE 1.18  CALCIUM 7.8*   LFT No results for input(s): PROT, ALBUMIN, AST, ALT, ALKPHOS, BILITOT, BILIDIR, IBILI in the last 72 hours. PT/INR No results for input(s): LABPROT, INR in the last 72 hours.  Studies/Results: Dg Abd 2 Views  10-27-15  CLINICAL DATA:  Abdominal distension, non gaseous, hypertension, diabetes mellitus, metastatic carcinoma of unknown primary EXAM: ABDOMEN - 2 VIEW COMPARISON:  CT abdomen and pelvis 10/18/2015 FINDINGS: Food debris and gas within stomach. Stool and gas throughout nondistended colon. CBD stent. Probable calcified gallstones projecting over RIGHT twelfth rib. Air-filled upper normal caliber small  bowel loops mid abdomen. No bowel wall thickening or free intraperitoneal air. Degenerative disc disease changes thoracolumbar spine. Osseous demineralization. No urinary tract calcifications. IMPRESSION: CBD stent. Probable calcified gallstones. Nonobstructive bowel gas pattern. Electronically Signed   By: Lavonia Dana M.D.   On: 10/27/2015 17:56       Assessment / Plan:     #1 59 yo male with widely metastatic colon cancer admitted with diarrhea and dehydration. Last chemo about 5 weeks ago Infectious workup negative- empirically started Flagyl 250 QID and florastor He may have component of obstruction contributing as known left colon mass persists- he is a bit distended today after resuming diet and d/c rectal tube yesterday- obstructive component may declare itself- observe on current regimen for now #2 malnutrition #3 recent PE #4 AKI- resolving #5 PAF  Principal Problem:   Hypovolemic shock ([place]) Active Problems:   Malnutrition of moderate degree (HCC)   Colon cancer (HCC)   PAF (paroxysmal atrial fibrillation) ([place])   Acute saddle pulmonary embolism with acute cor pulmonale (HCC)   Chronic diastolic congestive heart failure (HCC)   Anemia of chronic disease   AKI (acute kidney injury) ([place])   Hypotension   Diarrhea   Hypothyroidism   Acute kidney injury ([place])   Pressure ulcer   Abnormal x-ray of abdomen     LOS: 5 days   Amy Esterwood  10/23/2015, 8:37 AM  GI ATTENDING  Interval history data reviewed. Patient seen and examined personally. Wife in room. Agree with interval  progress note as outlined above. No further diarrhea. Abdomen slightly distended bowel sounds and no tenderness. No new recommendations from GI standpoint. We're available if needed in the future. Thank you  Docia Chuck. Geri [place]., M.D. [facility] Division of Gastroenterology

## 2015-10-23 NOTE — Progress Notes (Signed)
  Paged attending MD to make aware of critical lab: Potassium 2.6 called in per lab

## 2015-10-24 ENCOUNTER — Inpatient Hospital Stay (HOSPITAL_COMMUNITY): Payer: Managed Care, Other (non HMO)

## 2015-10-24 DIAGNOSIS — L8990 Pressure ulcer of unspecified site, unspecified stage: Secondary | ICD-10-CM

## 2015-10-24 DIAGNOSIS — K56609 Unspecified intestinal obstruction, unspecified as to partial versus complete obstruction: Secondary | ICD-10-CM | POA: Diagnosis not present

## 2015-10-24 LAB — BASIC METABOLIC PANEL
Anion gap: 9 (ref 5–15)
BUN: 35 mg/dL — AB (ref 6–20)
CO2: 18 mmol/L — AB (ref 22–32)
Calcium: 8.2 mg/dL — ABNORMAL LOW (ref 8.9–10.3)
Chloride: 111 mmol/L (ref 101–111)
Creatinine, Ser: 1.02 mg/dL (ref 0.61–1.24)
GFR calc Af Amer: >60 mL/min (ref >60)
GLUCOSE: 139 mg/dL — AB (ref 65–99)
POTASSIUM: 3.2 mmol/L — AB (ref 3.5–5.1)
Sodium: 138 mmol/L (ref 135–145)

## 2015-10-24 LAB — CULTURE, BLOOD (ROUTINE X 2)
CULTURE: NO GROWTH
Culture: NO GROWTH

## 2015-10-24 MED ORDER — PROMETHAZINE HCL 25 MG/ML IJ SOLN
12.5000 mg | Freq: Four times a day (QID) | INTRAMUSCULAR | Status: DC | PRN
  Administered 2015-10-24 – 2015-10-30 (×4): 12.5 mg via INTRAVENOUS
  Filled 2015-10-24 (×4): qty 1

## 2015-10-24 MED ORDER — FUROSEMIDE 10 MG/ML IJ SOLN
20.0000 mg | Freq: Two times a day (BID) | INTRAMUSCULAR | Status: DC
  Administered 2015-10-24: 20 mg via INTRAVENOUS
  Filled 2015-10-24: qty 2

## 2015-10-24 MED ORDER — METRONIDAZOLE 500 MG PO TABS
500.0000 mg | ORAL_TABLET | Freq: Three times a day (TID) | ORAL | Status: DC
  Filled 2015-10-24: qty 1

## 2015-10-24 MED ORDER — MAGNESIUM SULFATE 2 GM/50ML IV SOLN
2.0000 g | Freq: Once | INTRAVENOUS | Status: AC
  Administered 2015-10-24: 2 g via INTRAVENOUS
  Filled 2015-10-24: qty 50

## 2015-10-24 MED ORDER — LEVOTHYROXINE SODIUM 100 MCG IV SOLR
75.0000 ug | Freq: Every day | INTRAVENOUS | Status: DC
  Administered 2015-10-24 – 2015-10-31 (×8): 75 ug via INTRAVENOUS
  Filled 2015-10-24 (×10): qty 5

## 2015-10-24 MED ORDER — ENOXAPARIN SODIUM 120 MG/0.8ML ~~LOC~~ SOLN
115.0000 mg | Freq: Once | SUBCUTANEOUS | Status: AC
  Administered 2015-10-24: 115 mg via SUBCUTANEOUS
  Filled 2015-10-24: qty 0.8

## 2015-10-24 MED ORDER — KCL IN DEXTROSE-NACL 40-5-0.9 MEQ/L-%-% IV SOLN
INTRAVENOUS | Status: DC
  Administered 2015-10-24 – 2015-10-27 (×5): via INTRAVENOUS
  Filled 2015-10-24 (×6): qty 1000

## 2015-10-24 MED ORDER — ENOXAPARIN SODIUM 120 MG/0.8ML ~~LOC~~ SOLN
115.0000 mg | Freq: Two times a day (BID) | SUBCUTANEOUS | Status: DC
  Administered 2015-10-25 – 2015-10-29 (×8): 115 mg via SUBCUTANEOUS
  Filled 2015-10-24 (×10): qty 0.8

## 2015-10-24 MED ORDER — PROMETHAZINE HCL 25 MG/ML IJ SOLN
12.5000 mg | INTRAMUSCULAR | Status: AC
  Administered 2015-10-24: 12.5 mg via INTRAVENOUS
  Filled 2015-10-24: qty 1

## 2015-10-24 NOTE — Progress Notes (Signed)
PT Cancellation Note  Patient Details Name: Jaime [name] MRN: [id number] DOB: 12-15-1956   Cancelled Treatment:    Reason Eval/Treat Not Completed: Medical issues which prohibited therapy (per wife, patient feels very poorly today. )   Claretha Cooper 10/24/2015, 3:43 PM Tresa Endo PT [phone]

## 2015-10-24 NOTE — Progress Notes (Signed)
ANTICOAGULATION CONSULT NOTE - Initial Consult  Pharmacy Consult for Enoxaparin Indication: Hx Afib, PE, DVT  No Known Allergies  Patient Measurements: Height: 6\' 4"  (193 cm) Weight: 255 lb 11.7 oz (116 kg) IBW/kg (Calculated) : 86.8  Vital Signs: Temp: 97.9 F (36.6 C) (10/24 0535) Temp Source: Oral (10/24 0535) BP: 125/83 mmHg (10/24 0535) Pulse Rate: 90 (10/24 0535)  Labs:  Recent Labs  10/23/15 1035 10/24/15 0344  CREATININE 0.97 1.02    Estimated Creatinine Clearance: 107.3 mL/min (by C-G formula based on Cr of 1.02).   Medical History: Past Medical History  Diagnosis Date  . Diabetes mellitus without complication ([place])   . Hypertension   . Metastatic carcinoma involving liver with unknown primary site [facility])   . Hyperlipidemia   . Chronic diastolic congestive heart failure ([place]) 10/09/2015    Medications:  Scheduled:  . antiseptic oral rinse  7 mL Mouth Rinse BID  . furosemide  20 mg Intravenous Q12H  . levothyroxine  75 mcg Intravenous Daily  . metroNIDAZOLE  500 mg Oral 3 times per day   Infusions:  . dextrose 5 % and 0.9 % NaCl with KCl 40 mEq/L 50 mL/hr at 10/24/15 1120    Assessment: 34 yoM presented to ED on 10/18 from cancer center d/t hypotension and worsening of diarrhea. PMH significant for Atrial fibrillation, colon cancer currently undergoing chemotherapy treatment, and recent diagnosis of DVT and saddle PE (10/02/15) on Xarelto anticoagulation.  Xarelto was initially resumed on admission, but is now held for NPO status related to possible SBO.  Pharmacy is consulted to dose Lovenox.  Today, 10/24/2015: SCr 1.02 with CrCl > 100 ml/min (~ 78 ml/min N) CBC: last Hgb 9, Plt WNL (10/21) Currently, no invasive procedures planned.  Paracentesis only if NG tube cannot control N/V.  Currently, phenergan helping N/V.  Goal of Therapy:  Anti-Xa level 0.6-1 units/ml 4hrs after LMWH dose given Monitor platelets by anticoagulation protocol: Yes    Plan:   Enoxaparin 115 mg SQ BID  Follow up renal function and CBC  Follow up long-term anticoagulation plans.  Gretta Arab PharmD, BCPS Pager [phone] 10/24/2015 12:56 PM

## 2015-10-24 NOTE — Plan of Care (Signed)
Problem: Phase II Progression Outcomes Goal: Tolerating diet Outcome: Not Progressing Pt with N/V, unable to eat, prn antiemetics given

## 2015-10-24 NOTE — Progress Notes (Deleted)
Pt requesting to be able to take bipap off "I just can't stand it".  Education provided for rationale for use and pt verbalizes understanding.  Pt placed on 3l/m [place] and cont pulse ox and is sating 92%.  Will continue to monitor.

## 2015-10-24 NOTE — Care Management Note (Signed)
Case Management Note  Patient Details  Name: Jaime [name] MRN: [id number] Date of Birth: [birth year]  Subjective/Objective:  abd xray. DNR. Not progressing.MD to discuss possible palliative cons depending on diagnostic results. From SNF.                  Action/Plan:Monitor progress for d/c plans.   Expected Discharge Date:   (UNKNOWN)               Expected Discharge Plan:  Skilled Nursing Facility  In-House Referral:  Clinical Social Work  Discharge planning Services  CM Consult  Post Acute Care Choice:  NA Choice offered to:  NA  DME Arranged:    DME Agency:     HH Arranged:    [place] Agency:     Status of Service:  In process, will continue to follow  Medicare Important Message Given:    Date Medicare IM Given:    Medicare IM give by:    Date Additional Medicare IM Given:    Additional Medicare Important Message give by:     If discussed at [place] of Stay Meetings, dates discussed:    Additional Comments:  Dessa Phi, RN 10/24/2015, 10:46 AM

## 2015-10-24 NOTE — Progress Notes (Signed)
PROGRESS NOTE  Smayan Hackbart BMW:[id number] DOB: 09/14/1956 DOA: 10/18/2015 PCP: Velna Hatchet, MD  HPI/Recap of past 63 hours: 59 year old male with past medical history of paroxysmal atrial fibrillation, colon cancer status post chemotherapy and pulmonary embolus admitted on 10/18 for hypovolemic shock that has been ongoing for the past few weeks secondary to continued diarrhea. Initial blood pressure with a systolic of 60, but lactic acid level normal. Patient noted to have elevated white blood cell count of 24 and a creatinine of 1.7. Started on IV fluids plus Zosyn and placed in stepdown unit. C. difficile cultures negative.  Transferred out of unit by 10/19.  Over the next few days, patient slowly continued to improve. Diarrhea eventually slowed down and then resolved. Patient started becoming hypokalemic and hypomagnesemic by 10/23 requiring replacement. Oncology met with patient and wife to discuss long-term outcomes and patient's CODE STATUS changed to DO NOT RESUSCITATE.   Starting last night, patient continued to have episodes of abdominal discomfort and persistent vomiting. X-ray checked this morning noted early signs of small bowel obstruction. Patient changed to nothing by mouth and medication changed to IV.   Assessment/Plan: Principal Problem:   Hypovolemic shock (HCC) causing hypotension and secondary acute kidney injury: No evidence of sepsis given normal lactic acidosis. Hypotension secondary to diarrhea and continued Lasix use. Creatinine now normalized. IV fluids discontinued and Lasix initially started started. Lasix now on hold due to small bowel obstruction and trying to rehydrate. Replacing potassium accordingly with IV fluids Active Problems:   Malnutrition of moderate degree (HCC)   Colon cancer [facility]): Being followed by oncology, no plans for further chemotherapy unless functional status improves. Changed to DO NOT RESUSCITATE. Wife has asked palliative care consult  for goals of care   PAF (paroxysmal atrial fibrillation) ([place]): Currently in normal sinus rhythm, already on anticoagulation   Acute saddle pulmonary embolism with acute cor pulmonale [facility]): Have restarted xarelto   Chronic diastolic congestive heart failure [facility]): Patient now mildly volume overloaded, however given need for hydration for small bowel obstruction, have stopped Lasix and restarted gentle fluids with potassium   Anemia of chronic disease: Secondary to cancer. Continue iron     Diarrhea: Unclear etiology. No signs of infection, still cultures and C. difficile negative. Suspect maybe enteritis? Leukocytosis now normal. Stopped antibiotics after first day. Much improved  Edema: We'll diurese once IV fluids stopped  Code Status: Now DO NOT RESUSCITATE  Family Communication: Left message with wife  Disposition Plan: Likely here for several more days until small bowel obstruction resolved and diuresed   Consultants:  Oncology  Gastroenterology  Procedures:  None  Antibiotics:  Zosyn 10/18-10/19  By mouth Flagyl 10/22-10/24   Objective: BP 125/83 mmHg  Pulse 90  Temp(Src) 97.9 F (36.6 C) (Oral)  Resp 18  Ht 6' 4"  (1.93 m)  Wt 116 kg (255 lb 11.7 oz)  BMI 31.14 kg/m2  SpO2 100%  Intake/Output Summary (Last 24 hours) at 10/24/15 1504 Last data filed at 10/24/15 0933  Gross per 24 hour  Intake   1280 ml  Output    700 ml  Net    580 ml   Filed Weights   10/18/15 2300 10/23/15 0427 10/24/15 0500  Weight: 109.2 kg (240 lb 11.9 oz) 115.6 kg (254 lb 13.6 oz) 116 kg (255 lb 11.7 oz)    Exam:   General:  Somnolent following Phenergan  Cardiovascular: Regular rate and rhythm, S1-S2  Respiratory: Clear to auscultation bilaterally  Abdomen: Soft, nontender,  mild distention, scant bowel sounds  Musculoskeletal: 3+ pitting edema from the knees down  Data Reviewed: Basic Metabolic Panel:  Recent Labs Lab 10/19/15 0355 10/20/15 0730 10/21/15 0430  10/23/15 1030 10/23/15 1035 10/24/15 0344  NA 133* 135 137  --  138 138  K 3.1* 2.8* 2.9*  --  2.6* 3.2*  CL 105 108 109  --  112* 111  CO2 17* 19* 19*  --  18* 18*  GLUCOSE 116* 132* 113*  --  122* 139*  BUN 40* 40* 38*  --  34* 35*  CREATININE 1.69* 1.57* 1.18  --  0.97 1.02  CALCIUM 8.0* 7.9* 7.8*  --  7.9* 8.2*  MG  --   --   --  1.6*  --   --    Liver Function Tests:  Recent Labs Lab 10/18/15 0843 10/19/15 0355  AST 26 22  ALT 14 13*  ALKPHOS 367* 293*  BILITOT 2.57* 2.0*  PROT 6.2* 5.8*  ALBUMIN 1.8* 1.8*   No results for input(s): LIPASE, AMYLASE in the last 168 hours. No results for input(s): AMMONIA in the last 168 hours. CBC:  Recent Labs Lab 10/18/15 0842 10/19/15 0355 10/20/15 0730 10/21/15 0430  WBC 23.2* 16.1* 7.4 6.9  NEUTROABS 19.4* 13.6*  --   --   HGB 11.0* 10.2* 9.2* 9.0*  HCT 34.1* 31.9* 28.6* 28.8*  MCV 87.7 89.1 88.8 88.9  PLT 402* 335 244 246   Cardiac Enzymes:   No results for input(s): CKTOTAL, CKMB, CKMBINDEX, TROPONINI in the last 168 hours. BNP (last 3 results) No results for input(s): BNP in the last 8760 hours.  ProBNP (last 3 results) No results for input(s): PROBNP in the last 8760 hours.  CBG:  Recent Labs Lab 10/19/15 1053 10/19/15 2221 10/20/15 0750 10/20/15 1210 10/21/15 0757  GLUCAP 114* 139* 148* 182* 118*    Recent Results (from the past 240 hour(s))  C difficile quick scan w PCR reflex     Status: None   Collection Time: 10/18/15 11:32 AM  Result Value Ref Range Status   C Diff antigen NEGATIVE NEGATIVE Final   C Diff toxin NEGATIVE NEGATIVE Final   C Diff interpretation Negative for toxigenic C. difficile  Final  Stool culture     Status: None   Collection Time: 10/18/15 11:32 AM  Result Value Ref Range Status   Specimen Description STOOL  Final   Special Requests NONE  Final   Culture   Final    NO SALMONELLA, SHIGELLA, CAMPYLOBACTER, YERSINIA, OR E.COLI 0157:H7 ISOLATED Performed at [facility]    Report Status 10/22/2015 FINAL  Final  Ova and parasite examination     Status: None   Collection Time: 10/18/15 11:32 AM  Result Value Ref Range Status   Specimen Description STOOL  Final   Special Requests NONE  Final   Ova and parasites   Final    NO OVA OR PARASITES SEEN Performed at [facility]    Report Status 10/19/2015 FINAL  Final  MRSA PCR Screening     Status: None   Collection Time: 10/18/15 11:12 PM  Result Value Ref Range Status   MRSA by PCR NEGATIVE NEGATIVE Final    Comment:        The GeneXpert MRSA Assay (FDA approved for NASAL specimens only), is one component of a comprehensive MRSA colonization surveillance program. It is not intended to diagnose MRSA infection nor to guide or monitor treatment for MRSA infections.  Culture, blood (routine x 2)     Status: None   Collection Time: 10/18/15 11:30 PM  Result Value Ref Range Status   Specimen Description BLOOD RIGHT ARM  Final   Special Requests BOTTLES DRAWN AEROBIC AND ANAEROBIC 5ML  Final   Culture   Final    NO GROWTH 5 DAYS Performed at [facility]    Report Status 10/24/2015 FINAL  Final  Culture, blood (routine x 2)     Status: None   Collection Time: 10/19/15  3:55 AM  Result Value Ref Range Status   Specimen Description BLOOD LEFT ANTECUBITAL  Final   Special Requests BOTTLES DRAWN AEROBIC ONLY 5ML  Final   Culture   Final    NO GROWTH 5 DAYS Performed at [facility]    Report Status 10/24/2015 FINAL  Final     Studies: Dg Abd Portable 1v  10/24/2015  CLINICAL DATA:  Nausea and vomiting.  Abdominal distention. EXAM: PORTABLE ABDOMEN - 1 VIEW COMPARISON:  Single view of the abdomen 10/21/2015. FINDINGS: Since the prior examination, there has been marked increase in diffuse gaseous distention of small bowel. No gas is visualized in the colon. Common bile duct stent is noted. IMPRESSION: Marked increase and diffuse gaseous distention of small bowel  worrisome for small bowel obstruction. Electronically Signed   By: Inge Rise M.D.   On: 10/24/2015 10:11    Scheduled Meds: . antiseptic oral rinse  7 mL Mouth Rinse BID  . [START ON 10/25/2015] enoxaparin (LOVENOX) injection  115 mg Subcutaneous Q12H  . furosemide  20 mg Intravenous Q12H  . levothyroxine  75 mcg Intravenous Daily  . metroNIDAZOLE  500 mg Oral 3 times per day    Continuous Infusions: . dextrose 5 % and 0.9 % NaCl with KCl 40 mEq/L 50 mL/hr at 10/24/15 1120     Time spent: 35 minutes  [place] [facility] Pager [phone]. If 7PM-7AM, please contact night-coverage at [email or web address], password [facility] 10/24/2015, 3:04 PM  LOS: 6 days

## 2015-10-24 NOTE — Progress Notes (Signed)
IP PROGRESS NOTE  Subjective:   The diarrhea has resolved. He reports nausea and vomiting beginning yesterday. He complains of nausea this morning.  Objective: Vital signs in last 24 hours: Blood pressure 125/83, pulse 90, temperature 97.9 F (36.6 C), temperature source Oral, resp. rate 18, height 6\' 4"  (1.93 m), weight 255 lb 11.7 oz (116 kg), SpO2 100 %.  Intake/Output from previous day: 10/23 0701 - 10/24 0700 In: 1350 [P.O.:1350] Out: 400 [Urine:400]  Physical Exam:   Abdomen: Markedly distended, nontender Extremities: Pitting edema throughout the left greater than right leg   Portacath/PICC-without erythema   BMET  Recent Labs  10/23/15 1035 10/24/15 0344  NA 138 138  K 2.6* 3.2*  CL 112* 111  CO2 18* 18*  GLUCOSE 122* 139*  BUN 34* 35*  CREATININE 0.97 1.02  CALCIUM 7.9* 8.2*    Studies/Results: Dg Abd Portable 1v  10/24/2015  CLINICAL DATA:  Nausea and vomiting.  Abdominal distention. EXAM: PORTABLE ABDOMEN - 1 VIEW COMPARISON:  Single view of the abdomen 10/21/2015. FINDINGS: Since the prior examination, there has been marked increase in diffuse gaseous distention of small bowel. No gas is visualized in the colon. Common bile duct stent is noted. IMPRESSION: Marked increase and diffuse gaseous distention of small bowel worrisome for small bowel obstruction. Electronically Signed   By: Inge Rise M.D.   On: 10/24/2015 10:11    Medications: I have reviewed the patient's current medications.  Assessment/Plan: 1. 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, status post a colonoscopy 07/21/2015 confirming a sigmoid colon mass  Staging CT scans 07/13/2015 and 07/14/2015 confirmed extensive liver metastases, lung nodules, a right pleural effusion, and porta hepatis lymphadenopathy  Cycle 1 FOLFOX (chemotherapy doses reduced and bolus 5-FU eliminated) 08/02/2015  Cycle 2 FOLFOX (chemotherapy doses reduced and bolus 5-FU eliminated) 08/16/2015  Cycle  3 FOLFOX (Avastin added, bolus 5-FU introduced, and infusional 5-FU dose escalated) 08/30/2015  Cycle 4 FOLFOX plus Avastin 09/13/2015  Cycle 5 held on 09/27/2015 due to mild neutropenia, recent diarrhea.  CT of the chest 10/02/2015, compared to a CT from 07/13/2015-no significant change in lung nodules or liver metastases  Diagnostic/therapeutic paracentesis 10/05/2015-negative cytology  CEA lower on 10/11/2015  CT 10/18/2015 with mild progression of liver metastases and lung lesions  2. Obstructive jaundice, likely secondary to porta hepatis lymphadenopathy, status post placement of a bile duct stent on 07/14/2015  3. Microcytic anemia secondary to the colon mass-improved  4. Diabetes  5. Failure to thrive secondary to metastatic colon cancer  6. Port-A-Cath placement 08/01/2015  7. Nausea following cycle 1 and cycle 2 FOLFOX-emend added with cycle 3  8. Diarrhea following cycle 4 FOLFOX/Avastin.  9. Neutropenia following cycle 4 FOLFOX/Avastin. Neulasta to be added with cycle 5.  10. Admission 10/02/2015 with bilateral DVTs and pulmonary emboli-maintained on Xarelto 10/07/2015  11. Diarrhea-C. difficile negative, etiology unclear, resolved  12. Hypotension-likely secondary to dehydration, improved  13.  Nausea/vomiting-plain x-ray today suggestive of a small bowel obstruction  Mr. Nahar has developed a bowel obstruction. This may be secondary to carcinomatosis or the primary colon mass. I think we should continue supportive care measures and place an NG tube for refractory nausea. We can consider a repeat CT to look for a point of obstruction as he may be a candidate for a stent. Recommendations: 1. Continue anti-emetics, NG tube for refractory nausea 2. Repeat abdomen CT and consider GI consult if there is a point of obstruction amenable to a stent  I  discussed the situation with his wife in the afternoon on 10/24/2015.   LOS: 6 days    Jaime [name]  10/24/2015, 3:53 PM

## 2015-10-25 ENCOUNTER — Inpatient Hospital Stay (HOSPITAL_COMMUNITY): Payer: Managed Care, Other (non HMO)

## 2015-10-25 DIAGNOSIS — C187 Malignant neoplasm of sigmoid colon: Secondary | ICD-10-CM | POA: Insufficient documentation

## 2015-10-25 DIAGNOSIS — R188 Other ascites: Secondary | ICD-10-CM

## 2015-10-25 DIAGNOSIS — K56699 Other intestinal obstruction unspecified as to partial versus complete obstruction: Secondary | ICD-10-CM | POA: Insufficient documentation

## 2015-10-25 DIAGNOSIS — K5660 Unspecified intestinal obstruction: Secondary | ICD-10-CM

## 2015-10-25 DIAGNOSIS — Z515 Encounter for palliative care: Secondary | ICD-10-CM

## 2015-10-25 DIAGNOSIS — K5669 Other intestinal obstruction: Secondary | ICD-10-CM

## 2015-10-25 LAB — CBC
HCT: 31.2 % — ABNORMAL LOW (ref 39.0–52.0)
HEMOGLOBIN: 10.1 g/dL — AB (ref 13.0–17.0)
MCH: 28.9 pg (ref 26.0–34.0)
MCHC: 32.4 g/dL (ref 30.0–36.0)
MCV: 89.1 fL (ref 78.0–100.0)
PLATELETS: 348 10*3/uL (ref 150–400)
RBC: 3.5 MIL/uL — AB (ref 4.22–5.81)
RDW: 18 % — ABNORMAL HIGH (ref 11.5–15.5)
WBC: 9.4 10*3/uL (ref 4.0–10.5)

## 2015-10-25 LAB — BASIC METABOLIC PANEL
ANION GAP: 8 (ref 5–15)
BUN: 40 mg/dL — ABNORMAL HIGH (ref 6–20)
CALCIUM: 8.3 mg/dL — AB (ref 8.9–10.3)
CO2: 18 mmol/L — AB (ref 22–32)
Chloride: 113 mmol/L — ABNORMAL HIGH (ref 101–111)
Creatinine, Ser: 1.37 mg/dL — ABNORMAL HIGH (ref 0.61–1.24)
GFR, EST NON AFRICAN AMERICAN: 55 mL/min — AB (ref >60)
Glucose, Bld: 170 mg/dL — ABNORMAL HIGH (ref 65–99)
Potassium: 3.5 mmol/L (ref 3.5–5.1)
SODIUM: 139 mmol/L (ref 135–145)

## 2015-10-25 MED ORDER — ENSURE ENLIVE PO LIQD: 237.0000 mL | Freq: Two times a day (BID) | ORAL | Status: DC

## 2015-10-25 MED ORDER — IOHEXOL 300 MG/ML  SOLN
25.0000 mL | INTRAMUSCULAR | Status: AC
  Administered 2015-10-25: 25 mL via ORAL

## 2015-10-25 NOTE — Consult Note (Signed)
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 He said his diarrhea has been getting worse since last night with profuse watery diarrhea that has not been associated with any abdominal pain, hematochezia, tenesmus, nausea or vomiting. But he reported loss of appetite/anorexia for the last few days. No fever or chills. No other complaints. He was seen today by Dr.Sherril (oncology) and was found to be hypotensive with poor response to fluid in clinic. He was sent to the ER for further workup. C.diff PCR was neg.   Patient and family are faced with advanced directive decisions, limitation of treatment options, anticipatory care needs.  Patient and family struggle with the emotional and spiritual impacts of living with terminal disease.   This NP Wadie Lessen reviewed medical records, received report from team, assessed the patient and then meet at the patient's bedside along with his wife  to discuss diagnosis, prognosis, GOC,  and options.  A discussion was had today regarding advanced directives.    The difference between a aggressive medical intervention path  and a palliative comfort care path for this patient at this time was had.  Values and goals of care important to patient and family were attempted to be elicited.  Concept of Hospice and Palliative Care were  discussed    Questions and concerns addressed.  Family encouraged to call with questions or concerns.  PMT will continue to support holistically.   Primary Decision Maker: Patient with support of his wife     SUMMARY OF RECOMMENDATIONS  At this time patient and his family are open to all offered and available medical interventions to prolong life.  They await CT scan and input from GI for possible treatment options.  They are hopeful for improvement      Code Status/Advance Care Planning: DNR        Code Status Orders        Start     Ordered   10/23/15 1510  Do not attempt resuscitation (DNR)   Continuous    Question Answer Comment  In the event of cardiac or respiratory ARREST Do not call a "code blue"   In the event of cardiac or respiratory ARREST Do not perform Intubation, CPR, defibrillation or ACLS   In the event of cardiac or respiratory ARREST Use medication by any route, position, wound care, and other measures to relive pain and suffering. May use oxygen, suction and manual treatment of airway obstruction as needed for comfort.      10/23/15 1509    Advance Directive Documentation        Most Recent Value   Type of Advance Directive  Living will, Healthcare Power of Attorney   Pre-existing out of facility DNR order (yellow form or pink MOST form)     "MOST" Form in Place?          Symptom Management:   *           Nausea- Zofran 4 mg IV every  6 hrs prn  Bowel obstruction               -discussed comfort feeds/sips and chips/ swish and spit/   Palliative Prophylaxis:    Delirium Protocol, Frequent Pain Assessment and Oral Care   Psycho-social/Spiritual:  Support System: Strong Desire for further Chaplaincy support:no-strong community church support  Additional Recommendations: Education on Hospice  Prognosis: Pending decision regarding life prolonging intervetnions  Discharge Planning: Pending    Chief Complaint/ Primary  Diagnoses: Present on Admission:  . AKI (acute kidney injury) ([place]) . Hypotension . Hypovolemic shock ([place]) . PAF (paroxysmal atrial fibrillation) ([place]) . Malnutrition of moderate degree ([place]) . Colon cancer ([place]) . Chronic diastolic congestive heart failure ([place]) . Anemia of chronic disease . Acute saddle pulmonary embolism with acute cor pulmonale ([facility]) . Diarrhea . Hypothyroidism . Pressure ulcer  I have reviewed the medical record, interviewed the patient and family, and examined the patient. The following aspects are pertinent.  Past Medical History  Diagnosis Date  . Diabetes mellitus without complication ([place])   . Hypertension   . Metastatic carcinoma involving liver with unknown primary site [facility])   . Hyperlipidemia   . Chronic diastolic congestive heart failure ([place]) 10/09/2015   Social History   Social History  . Marital Status: Married    Spouse Name: N/A  . Number of Children: N/A  . Years of Education: N/A   Social History Main Topics  . Smoking status: Never Smoker   . Smokeless tobacco: Never Used  . Alcohol Use: 0.0 oz/week    0 Standard drinks or equivalent per week     Comment: social drinker  . Drug Use: No  . Sexual Activity: Yes   Other Topics Concern  . None   Social History Narrative   Lives with wife at home   Family History  Problem Relation Age of Onset  . Heart failure Mother   . Heart attack Mother   . Leukemia Father   . Colon cancer Neg Hx    Scheduled Meds: . antiseptic oral rinse  7 mL Mouth Rinse BID  . enoxaparin (LOVENOX) injection  115 mg Subcutaneous Q12H  . iohexol  25 mL Oral Q1 Hr x 2  . levothyroxine  75 mcg Intravenous Daily   Continuous Infusions: . dextrose 5 % and 0.9 % NaCl with KCl 40 mEq/L 50 mL/hr at 10/25/15 0538   PRN Meds:.morphine injection, ondansetron (ZOFRAN) IV, promethazine, sodium chloride Medications Prior to Admission:  Prior to Admission medications   Medication Sig Start Date End Date  Taking? Authorizing Provider  Amino Acids-Protein Hydrolys (FEEDING SUPPLEMENT, PRO-STAT SUGAR FREE 64,) LIQD Take 30 mLs by mouth 2 (two) times daily.   Yes Historical Provider, MD  antiseptic oral rinse (CPC / CETYLPYRIDINIUM CHLORIDE 0.05%) 0.05 % LIQD solution 7 mLs by Mouth Rinse route 2 (two) times daily. 10/11/15  Yes Robbie Lis, MD  furosemide (LASIX) 40 MG tablet Take 1 tablet (40 mg total) by mouth 2 (two) times daily. 10/11/15  Yes Robbie Lis, MD  levothyroxine (SYNTHROID, LEVOTHROID) 75 MCG tablet Take 75 mcg by mouth daily before breakfast.   Yes Historical Provider, MD  Multiple Vitamins-Minerals (DECUBI-VITE PO) Take 1 capsule by mouth daily.   Yes Historical Provider, MD  Nutritional Supplements (NUTRITIONAL SHAKE PO) Take 120 mLs by mouth 2 (two) times daily. Med pass   Yes Historical Provider, MD  potassium chloride (KLOR-CON) 20 MEQ packet Take 20 mEq by mouth daily. 09/13/15  Yes Owens Shark, NP  Rivaroxaban (XARELTO) 15 MG TABS tablet Take 1 tablet (15 mg total) by mouth 2 (two) times daily with a meal. 10/11/15  Yes Robbie Lis, MD  rivaroxaban (XARELTO) 20 MG TABS tablet Take 20 mg by mouth daily with supper.   Yes Historical Provider, MD  diphenoxylate-atropine (LOMOTIL) 2.5-0.025 MG per tablet Take 1 tablet by mouth 4 (four) times daily as needed for diarrhea or loose stools. 09/27/15   Owens Shark, NP  ergocalciferol (VITAMIN D2) 50000 UNITS capsule Take 50,000 Units by mouth 2 (two) times a week. Wed and Sat    Historical Provider, MD  feeding supplement, ENSURE ENLIVE, (ENSURE ENLIVE) LIQD Take 237 mLs by mouth 2 (two) times daily between meals. 10/11/15   Robbie Lis, MD  lidocaine-prilocaine (EMLA) cream Apply small amount of cream over port area 1-2 hours prior to treatment and cover with plastic wrap.  DO NOT RUB IN 07/26/15   Ladell Pier, MD  ondansetron (ZOFRAN) 8 MG tablet Take 1 tablet (8 mg total) by mouth every 8 (eight) hours as needed for nausea or  vomiting. 07/26/15   Ladell Pier, MD  prochlorperazine (COMPAZINE) 10 MG tablet TAKE 1 TABLET (10 MG TOTAL) BY MOUTH EVERY 6 (SIX) HOURS AS NEEDED FOR NAUSEA OR VOMITING. 09/23/15   Ladell Pier, MD  traMADol (ULTRAM) 50 MG tablet Take 1 tablet (50 mg total) by mouth every 6 (six) hours as needed for moderate pain. 10/11/15   Robbie Lis, MD   No Known Allergies CBC:    Component Value Date/Time   WBC 6.9 10/21/2015 0430   WBC 23.2* 10/18/2015 0842   HGB 9.0* 10/21/2015 0430   HGB 11.0* 10/18/2015 0842   HCT 28.8* 10/21/2015 0430   HCT 34.1* 10/18/2015 0842   PLT 246 10/21/2015 0430   PLT 402* 10/18/2015 0842   MCV 88.9 10/21/2015 0430   MCV 87.7 10/18/2015 0842   NEUTROABS 13.6* 10/19/2015 0355   NEUTROABS 19.4* 10/18/2015 0842   LYMPHSABS 1.2 10/19/2015 0355   LYMPHSABS 1.9 10/18/2015 0842   MONOABS 1.1* 10/19/2015 0355   MONOABS 1.9* 10/18/2015 0842   EOSABS 0.2 10/19/2015 0355   EOSABS 0.1 10/18/2015 0842   BASOSABS 0.0 10/19/2015 0355   BASOSABS 0.1 10/18/2015 0842   Comprehensive Metabolic Panel:    Component Value Date/Time   NA 139 10/25/2015 0438   NA 134* 10/18/2015 0843   K 3.5 10/25/2015 0438   K 3.7 10/18/2015 0843   CL 113* 10/25/2015 0438   CO2 18* 10/25/2015 0438   CO2 20* 10/18/2015 0843   BUN 40* 10/25/2015 0438   BUN 36.8* 10/18/2015 0843   CREATININE 1.37* 10/25/2015 0438   CREATININE 1.7* 10/18/2015 0843   GLUCOSE 170* 10/25/2015 0438   GLUCOSE 126 10/18/2015 0843   CALCIUM 8.3* 10/25/2015 0438   CALCIUM 8.6 10/18/2015 0843   AST 22 10/19/2015 0355   AST 26 10/18/2015 0843   ALT 13* 10/19/2015 0355   ALT 14 10/18/2015 0843   ALKPHOS 293* 10/19/2015 0355   ALKPHOS 367* 10/18/2015 0843   BILITOT 2.0* 10/19/2015 0355   BILITOT 2.57* 10/18/2015 0843   PROT 5.8* 10/19/2015 0355   PROT 6.2* 10/18/2015 0843   ALBUMIN 1.8* 10/19/2015 0355   ALBUMIN 1.8* 10/18/2015 0843    Review of Systems  Constitutional: Positive for appetite change  and fatigue.  HENT: Negative.        + temporal muscle wasting  Eyes:  Negative.   Respiratory: Negative.   Cardiovascular: Negative.   Genitourinary:       Ascites  Musculoskeletal:       Weakness  Neurological: Positive for weakness.       Lethargy    Physical Exam  Constitutional: He is oriented to person, place, and time.  Weak, lethargic, ill appearing  HENT:  + temporal muscle wasting  Cardiovascular: Normal rate, regular rhythm and normal heart sounds.   Respiratory:  Decreased in bases  GI: He exhibits distension.  Neurological: He is alert and oriented to person, place, and time.  Skin: Skin is warm and dry.    Vital Signs: BP 113/80 mmHg  Pulse 85  Temp(Src) 97.6 F (36.4 C) (Oral)  Resp 18  Ht 6\' 4"  (1.93 m)  Wt 116 kg (255 lb 11.7 oz)  BMI 31.14 kg/m2  SpO2 100% SpO2: Last BM Date: 10/23/15  O2 Device:SpO2: 100 % O2 Flow Rate: .  Intake/output summary:  Intake/Output Summary (Last 24 hours) at 10/25/15 1003 Last data filed at 10/25/15 0700  Gross per 24 hour  Intake 983.33 ml  Output    175 ml  Net 808.33 ml   LBM:  BMP Latest Ref Rng 10/25/2015 10/24/2015 10/23/2015  Glucose 65 - 99 mg/dL 170(H) 139(H) 122(H)  BUN 6 - 20 mg/dL 40(H) 35(H) 34(H)  Creatinine 0.61 - 1.24 mg/dL 1.37(H) 1.02 0.97  Sodium 135 - 145 mmol/L 139 138 138  Potassium 3.5 - 5.1 mmol/L 3.5 3.2(L) 2.6(LL)  Chloride 101 - 111 mmol/L 113(H) 111 112(H)  CO2 22 - 32 mmol/L 18(L) 18(L) 18(L)  Calcium 8.9 - 10.3 mg/dL 8.3(L) 8.2(L) 7.9(L)    Baseline Weight: Weight: 109.2 kg (240 lb 11.9 oz) Most recent weight: Weight: 116 kg (255 lb 11.7 oz)      Palliative Assessment/Data:  Flowsheet Rows        Most Recent Value   Intake Tab    Referral Department  Hospitalist   Unit at Time of Referral  Cardiac/Telemetry Unit   Palliative Care Primary Diagnosis  Cancer   Date Notified  10/24/15   Palliative Care Type  New Palliative care   Reason for referral  Clarify Goals of Care    Date of Admission  10/18/15   # of days IP prior to Palliative referral  6   Clinical Assessment    Psychosocial & Spiritual Assessment    Palliative Care Outcomes       Additional Data Reviewed: Recent Labs     10/24/15  0344  10/25/15  0438  NA  138  139  BUN  35*  40*  CREATININE  1.02  1.37*    Time In: 0900 Time Out: 1030 Time Total: 90 min Greater than 50%  of this time was spent counseling and coordinating care related to the above assessment and plan.   Signed by: Wadie Lessen, NP  Knox Royalty, NP  10/25/2015, 10:03 AM  Please contact Palliative Medicine Team phone at [phone] for questions and concerns.

## 2015-10-25 NOTE — Progress Notes (Signed)
Unable to get thru to [facility] to Dr. Benay Spice. To give ct results.

## 2015-10-25 NOTE — Progress Notes (Signed)
IP PROGRESS NOTE  Subjective:   He continues to have nausea, no emesis this morning. He reports passing flatus.  Objective: Vital signs in last 24 hours: Blood pressure 113/80, pulse 85, temperature 97.6 F (36.4 C), temperature source Oral, resp. rate 18, height 6\' 4"  (1.93 m), weight 255 lb 11.7 oz (116 kg), SpO2 100 %.  Intake/Output from previous day: 10/24 0701 - 10/25 0700 In: 1033.3 [I.V.:983.3; IV Piggyback:50] Out: 161 [Urine:175; Emesis/NG output:300]  Physical Exam:   Abdomen: Markedly distended, nontender Extremities: Pitting edema throughout the left greater than right leg    BMET  Recent Labs  10/24/15 0344 10/25/15 0438  NA 138 139  K 3.2* 3.5  CL 111 113*  CO2 18* 18*  GLUCOSE 139* 170*  BUN 35* 40*  CREATININE 1.02 1.37*  CALCIUM 8.2* 8.3*    Studies/Results: Dg Abd Portable 1v  10/25/2015  CLINICAL DATA:  Nausea, small-bowel obstruction EXAM: PORTABLE ABDOMEN - 1 VIEW COMPARISON:  Supine abdominal film of October 24, 2015 FINDINGS: There remain loops of mildly distended gas-filled small bowel throughout the mid and lower abdomen. There is a small amount of gas within the stomach. There is a stent present within the common bile duct. No free extraluminal gas collections are observed. There is no evidence of colonic dilation. IMPRESSION: Persistent distal small bowel obstructive pattern. Nasogastric suction may be useful. Electronically Signed   By: David  Martinique M.D.   On: 10/25/2015 07:24   Dg Abd Portable 1v  10/24/2015  CLINICAL DATA:  Nausea and vomiting.  Abdominal distention. EXAM: PORTABLE ABDOMEN - 1 VIEW COMPARISON:  Single view of the abdomen 10/21/2015. FINDINGS: Since the prior examination, there has been marked increase in diffuse gaseous distention of small bowel. No gas is visualized in the colon. Common bile duct stent is noted. IMPRESSION: Marked increase and diffuse gaseous distention of small bowel worrisome for small bowel  obstruction. Electronically Signed   By: Inge Rise M.D.   On: 10/24/2015 10:11    Medications: I have reviewed the patient's current medications.  Assessment/Plan: 1. 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, status post a colonoscopy 07/21/2015 confirming a sigmoid colon mass  Staging CT scans 07/13/2015 and 07/14/2015 confirmed extensive liver metastases, lung nodules, a right pleural effusion, and porta hepatis lymphadenopathy  Cycle 1 FOLFOX (chemotherapy doses reduced and bolus 5-FU eliminated) 08/02/2015  Cycle 2 FOLFOX (chemotherapy doses reduced and bolus 5-FU eliminated) 08/16/2015  Cycle 3 FOLFOX (Avastin added, bolus 5-FU introduced, and infusional 5-FU dose escalated) 08/30/2015  Cycle 4 FOLFOX plus Avastin 09/13/2015  Cycle 5 held on 09/27/2015 due to mild neutropenia, recent diarrhea.  CT of the chest 10/02/2015, compared to a CT from 07/13/2015-no significant change in lung nodules or liver metastases  Diagnostic/therapeutic paracentesis 10/05/2015-negative cytology  CEA lower on 10/11/2015  CT 10/18/2015 with mild progression of liver metastases and lung lesions  2. Obstructive jaundice, likely secondary to porta hepatis lymphadenopathy, status post placement of a bile duct stent on 07/14/2015  3. Microcytic anemia secondary to the colon mass-improved  4. Diabetes  5. Failure to thrive secondary to metastatic colon cancer  6. Port-A-Cath placement 08/01/2015  7. Nausea following cycle 1 and cycle 2 FOLFOX-emend added with cycle 3  8. Diarrhea following cycle 4 FOLFOX/Avastin.  9. Neutropenia following cycle 4 FOLFOX/Avastin. Neulasta to be added with cycle 5.  10. Admission 10/02/2015 with bilateral DVTs and pulmonary emboli-maintained on Xarelto 10/07/2015  11. Diarrhea-C. difficile negative, etiology unclear, resolved  12. Hypotension-likely  secondary to dehydration, improved  13.  Nausea/vomiting-plain x-ray   suggestive of a small bowel obstruction  Jaime [name] has has a persistent small bowel obstruction. The obstruction may be due to carcinomatosis or the primary colon tumor. I discussed the case with Dr. Carlean Purl this morning. He recommends proceeding with a CT. Recommendations: 1. Continue anti-emetics, NG tube for refractory nausea 2. CT of the abdomen with rectal contrast and oral contrast as tolerated today     LOS: 7 days   [place], Ripley Bogosian  10/25/2015, 9:48 AM

## 2015-10-25 NOTE — Progress Notes (Signed)
Patient discussed in unit rounds today- not medically stable for dc yet- CSW will update [facility].   Eduard Clos, MSW, [place]

## 2015-10-25 NOTE — Progress Notes (Signed)
Nutrition Follow-up  DOCUMENTATION CODES:   Not applicable  INTERVENTION:  - Will order supplements with diet advancement - RD will continue to monitor for needs  NUTRITION DIAGNOSIS:   Increased nutrient needs related to catabolic illness, cancer and cancer related treatments as evidenced by estimated needs. 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 He said his diarrhea has been getting worse since last night with profuse watery diarrhea that has not been associated with any abdominal pain, hematochezia, tenesmus, nausea or vomiting. But he reported loss of appetite/anorexia for the last few days. No fever or chills. No other complaints. He was seen today by Dr.Sherril (oncology) and was found to be hypotensive with poor response to fluid in clinic. He was sent to the ER for further workup. C.diff PCR was neg.   10/25 Per chart review, pt ate 100% breakfast on Regular diet 10/20. Diet order history as follows:  10/18 @ 2046: [place] 10/18 @ 5830: NPO 10/19 @ 9407: Regular diet 10/22 @ 0001: NPO 10/22 @ 0920: Soft diet 10/24 @ 1050: NPO  Per chart, pt began having nausea with persistent emesis 10/23. Pt states this was ongoing until last night but that he is feeling much better this AM; no nausea and denies emesis recently. He was trying to rest at time of RD visit and was not feeling well enough for discussion and requested that physical assessment not be done at this time; still unable to confirm severity of malnutrition but certainly feel that malnutrition is present.  Per MD note yesterday  (10/24) at 1504: X-ray checked this morning noted early signs of small bowel obstruction. Patient changed to nothing by mouth and medication changed to IV. Will continue to monitor for plan of care (POC) with findings.  Not able to meet needs while NPO. Medications reviewed. Labs reviewed; CBGs: 114-182 mg/dL, Cl: 113 mmol/L, BUN/creatinine elevated and trending up, Ca: 8.3 mg/dL.    10/19 - Pt has been NPO since admission; will monitor for diet advancement and order supplements per pt preference at that time.  - Pt states that since yesterday his appetite has been fluctuating. He denies abdominal pain or nausea yesterday or today. - Physical assessment unable to be done as RN and tech in the room to take pt off of bedpan. Unable to state malnutrition at this time although pt may meet criteria. - Pt seen by [place] RD yesterday in the chemo infusion room. Per her note, pt had paracentesis 10/05/15 with 6 L removed and weight 10/12/15 was 233 lbs which shows 7 lb weight gain in 7 days.  - Pt was educated on high-protein foods at that time and for good glycemic control for wound healing.  - Per chart review, pt has lost 18 lbs (7% body weight) in the past 1 month; this is likely partly fluid related.    Diet Order:  Diet NPO time specified  Skin:  Wound (see comment) (Stage 2 sacral pressure ulcer)  Last BM:  10/23  Height:   Ht Readings from Last 1 Encounters:  10/18/15 6\' 4"  (1.93 m)  Weight:   Wt Readings from Last 1 Encounters:  10/25/15 255 lb 11.7 oz (116 kg)    Ideal Body Weight:  91.82 kg (kg)  BMI:  Body mass index is 31.14 kg/(m^2).  Estimated Nutritional Needs:   Kcal:  2400-2600  Protein:  110-120 grams  Fluid:  2.2-2.5 L/day  EDUCATION NEEDS:   No education needs identified at this time      Jarome Matin, RD, LDN Inpatient Clinical Dietitian Pager # [phone] After hours/weekend pager # [phone]

## 2015-10-25 NOTE — Progress Notes (Signed)
PROGRESS NOTE  Jaime [name] XBJ:[id number] DOB: [birth year] DOA: 10/18/2015 PCP: Velna Hatchet, MD  HPI/Recap of past 16 hours: 59 year old male with past medical history of paroxysmal atrial fibrillation, colon cancer status post chemotherapy and pulmonary embolus admitted on 10/18 for hypovolemic shock that has been ongoing for the past few weeks secondary to continued diarrhea. Initial blood pressure with a systolic of 60, but lactic acid level normal. Patient noted to have elevated white blood cell count of 24 and a creatinine of 1.7. Started on IV fluids plus Zosyn and placed in stepdown unit. C. difficile cultures negative.  Transferred out of unit by 10/19.  Over the next few days, patient slowly continued to improve. Diarrhea eventually slowed down and then resolved. Patient started becoming hypokalemic and hypomagnesemic by 10/23 requiring replacement. Oncology met with patient and wife to discuss long-term outcomes and patient's CODE STATUS changed to DO NOT RESUSCITATE.   Starting last night, patient continued to have episodes of abdominal discomfort and persistent vomiting. X-ray checked this morning noted early signs of small bowel obstruction. Patient changed to nothing by mouth and medication changed to IV.   Assessment/Plan: Principal Problem:   Hypovolemic shock (HCC) causing hypotension and secondary acute kidney injury: No evidence of sepsis given normal lactic acidosis. Hypotension secondary to diarrhea and continued Lasix use. Creatinine now normalized. IV fluids discontinued and Lasix initially started started. Lasix now on hold due to small bowel obstruction and trying to rehydrate. Replacing potassium accordingly with IV fluids.    Small bowel obstruction: Oncology ordered a CT abd, shows SBO from the colon mass.  Gi re consulted by Dr Benay Spice.   Active Problems:   Malnutrition of moderate degree (HCC)   Colon cancer [facility]): Being followed by oncology, no plans  for further chemotherapy unless functional status improves. Changed to DO NOT RESUSCITATE. Wife has asked palliative care consult for goals of care.    PAF (paroxysmal atrial fibrillation) ([place]): Currently in normal sinus rhythm, already on anticoagulation.     Acute saddle pulmonary embolism with acute cor pulmonale [facility]): Have restarted xarelto    Chronic diastolic congestive heart failure [facility]): Patient now mildly volume overloaded, however given need for hydration for small bowel obstruction, have stopped Lasix and restarted gentle fluids with potassium, .     Anemia of chronic disease: Secondary to cancer. Continue iron     Diarrhea: Unclear etiology. No signs of infection, negative cultures and C. difficile negative. Suspect maybe enteritis? Leukocytosis now normal. Stopped antibiotics after first day. Much improved  Edema: We'll diurese once IV fluids stopped  Code Status: Now DO NOT RESUSCITATE  Family Communication: wife at bedside.   Disposition Plan: Likely here for several more days until small bowel obstruction resolved and diuresed   Consultants:  Oncology  Gastroenterology  Procedures:  CT ABDOMEN and pelvis.   Antibiotics:  Zosyn 10/18-10/19  By mouth Flagyl 10/22-10/24   Objective: BP 117/79 mmHg  Pulse 92  Temp(Src) 97.5 F (36.4 C) (Oral)  Resp 18  Ht _0  (1.93 m)  Wt 116 kg (255 lb 11.7 oz)  BMI 31.14 kg/m2  SpO2 100%  Intake/Output Summary (Last 24 hours) at 10/25/15 1713 Last data filed at 10/25/15 1500  Gross per 24 hour  Intake 1383.33 ml  Output    175 ml  Net 1208.33 ml   Filed Weights   10/23/15 0427 10/24/15 0500 10/25/15 0504  Weight: 115.6 kg (254 lb 13.6 oz) 116 kg (255 lb 11.7 oz) 116 kg (  255 lb 11.7 oz)    Exam:   General:  Alert and oriented, not in any distress.   Cardiovascular: Regular rate and rhythm, S1-S2  Respiratory: Clear to auscultation bilaterally, diminished at bases.   Abdomen: Soft, nontender,  moderate distention, good bowel sounds  Musculoskeletal: 3+ pitting edema from the knees down  Data Reviewed: Basic Metabolic Panel:  Recent Labs Lab 10/20/15 0730 10/21/15 0430 10/23/15 1030 10/23/15 1035 10/24/15 0344 10/25/15 0438  NA 135 137  --  138 138 139  K 2.8* 2.9*  --  2.6* 3.2* 3.5  CL 108 109  --  112* 111 113*  CO2 19* 19*  --  18* 18* 18*  GLUCOSE 132* 113*  --  122* 139* 170*  BUN 40* 38*  --  34* 35* 40*  CREATININE 1.57* 1.18  --  0.97 1.02 1.37*  CALCIUM 7.9* 7.8*  --  7.9* 8.2* 8.3*  MG  --   --  1.6*  --   --   --    Liver Function Tests:  Recent Labs Lab 10/19/15 0355  AST 22  ALT 13*  ALKPHOS 293*  BILITOT 2.0*  PROT 5.8*  ALBUMIN 1.8*   No results for input(s): LIPASE, AMYLASE in the last 168 hours. No results for input(s): AMMONIA in the last 168 hours. CBC:  Recent Labs Lab 10/19/15 0355 10/20/15 0730 10/21/15 0430 10/25/15 1000  WBC 16.1* 7.4 6.9 9.4  NEUTROABS 13.6*  --   --   --   HGB 10.2* 9.2* 9.0* 10.1*  HCT 31.9* 28.6* 28.8* 31.2*  MCV 89.1 88.8 88.9 89.1  PLT 335 244 246 348   Cardiac Enzymes:   No results for input(s): CKTOTAL, CKMB, CKMBINDEX, TROPONINI in the last 168 hours. BNP (last 3 results) No results for input(s): BNP in the last 8760 hours.  ProBNP (last 3 results) No results for input(s): PROBNP in the last 8760 hours.  CBG:  Recent Labs Lab 10/19/15 1053 10/19/15 2221 10/20/15 0750 10/20/15 1210 10/21/15 0757  GLUCAP 114* 139* 148* 182* 118*    Recent Results (from the past 240 hour(s))  C difficile quick scan w PCR reflex     Status: None   Collection Time: 10/18/15 11:32 AM  Result Value Ref Range Status   C Diff antigen NEGATIVE NEGATIVE Final   C Diff toxin NEGATIVE NEGATIVE Final   C Diff interpretation Negative for toxigenic C. difficile  Final  Stool culture     Status: None   Collection Time: 10/18/15 11:32 AM  Result Value Ref Range Status   Specimen Description STOOL  Final    Special Requests NONE  Final   Culture   Final    NO SALMONELLA, SHIGELLA, CAMPYLOBACTER, YERSINIA, OR E.COLI 0157:H7 ISOLATED Performed at [facility]    Report Status 10/22/2015 FINAL  Final  Ova and parasite examination     Status: None   Collection Time: 10/18/15 11:32 AM  Result Value Ref Range Status   Specimen Description STOOL  Final   Special Requests NONE  Final   Ova and parasites   Final    NO OVA OR PARASITES SEEN Performed at [facility]    Report Status 10/19/2015 FINAL  Final  MRSA PCR Screening     Status: None   Collection Time: 10/18/15 11:12 PM  Result Value Ref Range Status   MRSA by PCR NEGATIVE NEGATIVE Final    Comment:        The GeneXpert  MRSA Assay (FDA approved for NASAL specimens only), is one component of a comprehensive MRSA colonization surveillance program. It is not intended to diagnose MRSA infection nor to guide or monitor treatment for MRSA infections.   Culture, blood (routine x 2)     Status: None   Collection Time: 10/18/15 11:30 PM  Result Value Ref Range Status   Specimen Description BLOOD RIGHT ARM  Final   Special Requests BOTTLES DRAWN AEROBIC AND ANAEROBIC 5ML  Final   Culture   Final    NO GROWTH 5 DAYS Performed at [facility]    Report Status 10/24/2015 FINAL  Final  Culture, blood (routine x 2)     Status: None   Collection Time: 10/19/15  3:55 AM  Result Value Ref Range Status   Specimen Description BLOOD LEFT ANTECUBITAL  Final   Special Requests BOTTLES DRAWN AEROBIC ONLY 5ML  Final   Culture   Final    NO GROWTH 5 DAYS Performed at [facility]    Report Status 10/24/2015 FINAL  Final     Studies: Ct Abdomen Pelvis Wo Contrast  10/25/2015  CLINICAL DATA:  Evaluate for small bowel obstruction. Elevated white blood cell count. Vomiting. EXAM: CT ABDOMEN AND PELVIS WITHOUT CONTRAST TECHNIQUE: Multidetector CT imaging of the abdomen and pelvis was performed following the standard  protocol without IV contrast. COMPARISON:  10/18/2015 FINDINGS: Lower chest: There are small bilateral pleural effusions identified left greater than right. Overlying atelectasis noted. Pulmonary nodules are again noted compatible with metastatic disease. Hepatobiliary: Extensive liver metastasis is identified throughout both lobes of the liver involving greater than 70% of the liver parenchyma. This appears similar to previous exam. Gallstones are again noted. There is a stent within the common bile duct. Pancreas: Atrophy of the pancreas. Spleen: Normal appearance of the spleen. Adrenals/Urinary Tract: The left adrenal gland is normal. Similar appearance of right adrenal nodule measuring 2.1 cm, image 36 of series 2. Kidneys are unremarkable. Urinary bladder appears normal. Stomach/Bowel: Normal appearance of the stomach. Abnormal small and large bowel dilatation is again noted. Numerous small bowel fluid levels are identified. Colonic distention extends to the level of the sigmoid colon were there is a solid-appearing mass measuring approximately 4.7 cm, image 78/series 2. Enteric contrast material is identified within the rectum, likely from CT dated 10/18/2015. The rectum has a normal caliber. There is a rectal tube in place. Vascular/Lymphatic: Calcified atherosclerotic disease involves the abdominal aorta. No aneurysm. Pre aortic lymph node within the retroperitoneum measures 9 mm, image 48 of series 2. Similar to previous exam. No significant pelvic or inguinal adenopathy. Reproductive: Prostate gland and seminal vesicles difficult to visualize. Other: There is a marked amount of ascites within the abdomen and pelvis. Musculoskeletal: Lytic bone metastases involving the left iliac bone is again noted. This extends to the roof of the acetabulum and measures 5.6 cm, image 94 of series 2. Previously 5.6 cm. This may be of orthopedic significance. IMPRESSION: 1. Abnormal bowel distension. Favor partial  mechanical obstruction secondary to sigmoid colon mass. 2. Diffuse liver metastasis. 3. Pulmonary nodules worrisome for metastatic disease 4. Large lytic lesion within the left iliac bone is identified. This may be of orthopedic significance. 5. Marked amount of abdominal ascites. This may reflect liver dysfunction secondary to extensive metastatic disease and/or peritoneal metastasis. 6. These results will be called to the ordering clinician or representative by the Radiologist Assistant, and communication documented in the PACS or zVision Dashboard. Electronically Signed  By: Kerby Moors M.D.   On: 10/25/2015 15:30   Dg Abd Portable 1v  10/25/2015  CLINICAL DATA:  Nausea, small-bowel obstruction EXAM: PORTABLE ABDOMEN - 1 VIEW COMPARISON:  Supine abdominal film of October 24, 2015 FINDINGS: There remain loops of mildly distended gas-filled small bowel throughout the mid and lower abdomen. There is a small amount of gas within the stomach. There is a stent present within the common bile duct. No free extraluminal gas collections are observed. There is no evidence of colonic dilation. IMPRESSION: Persistent distal small bowel obstructive pattern. Nasogastric suction may be useful. Electronically Signed   By: David  Martinique M.D.   On: 10/25/2015 07:24    Scheduled Meds: . antiseptic oral rinse  7 mL Mouth Rinse BID  . enoxaparin (LOVENOX) injection  115 mg Subcutaneous Q12H  . levothyroxine  75 mcg Intravenous Daily    Continuous Infusions: . dextrose 5 % and 0.9 % NaCl with KCl 40 mEq/L 50 mL/hr at 10/25/15 0538     Time spent: 15 minutes  [facility] Pager [phone]. If 7PM-7AM, please contact night-coverage at [email or web address], password [facility] 10/25/2015, 5:13 PM  LOS: 7 days

## 2015-10-25 NOTE — Progress Notes (Signed)
   Patient Name: Jaime [name] Date of Encounter: 10/25/2015, 5:27 PM    Subjective  Asked to see him again by Dr. Benay Spice re: bowel obstruction. Jaime [name] has not vomited today - would like some water. Says + flatus.   Objective  BP 117/79 mmHg  Pulse 92  Temp(Src) 97.5 F (36.4 C) (Oral)  Resp 18  Ht 6\' 4"  (1.93 m)  Wt 255 lb 11.7 oz (116 kg)  BMI 31.14 kg/m2  SpO2 100% Asthenic and emaciated marked abd ascites, + BS w/ high pitched sounds- few. abd NT Awake, alert and oriented and appropriate mood/affect for situation Eyes anicteric  Data: CT scan abd pelvis today shows stricture in sigmoid colon with dilated colon and small bowel prximal  It is about 5 cm long and 40 cm from rectum.  Marked ascites also  I reviewed w/ radiology.    Assessment and Plan  1) Progressive St IV colon cancer of sigmoid Tx w/ chemoTx but has not had surgery - has sigmoid malignant stricture with bowel obstruction. 2) Massive ascites  I went over things with patient and wife. We have decided to pursue a large vol paracentesis tomorrow Cl liqs small amounts for now Reassess re: possible stent in sigmoid colon after paracentesis.  Gatha Mayer, MD, [facility] [phone] (pager) 10/25/2015 5:27 PM

## 2015-10-26 ENCOUNTER — Inpatient Hospital Stay (HOSPITAL_COMMUNITY): Payer: Managed Care, Other (non HMO)

## 2015-10-26 DIAGNOSIS — R531 Weakness: Secondary | ICD-10-CM | POA: Diagnosis present

## 2015-10-26 DIAGNOSIS — K56609 Unspecified intestinal obstruction, unspecified as to partial versus complete obstruction: Secondary | ICD-10-CM | POA: Insufficient documentation

## 2015-10-26 LAB — BASIC METABOLIC PANEL
ANION GAP: 7 (ref 5–15)
BUN: 42 mg/dL — ABNORMAL HIGH (ref 6–20)
CALCIUM: 8.3 mg/dL — AB (ref 8.9–10.3)
CO2: 17 mmol/L — ABNORMAL LOW (ref 22–32)
Chloride: 116 mmol/L — ABNORMAL HIGH (ref 101–111)
Creatinine, Ser: 1.43 mg/dL — ABNORMAL HIGH (ref 0.61–1.24)
GFR, EST NON AFRICAN AMERICAN: 52 mL/min — AB (ref >60)
GLUCOSE: 142 mg/dL — AB (ref 65–99)
Potassium: 3.9 mmol/L (ref 3.5–5.1)
SODIUM: 140 mmol/L (ref 135–145)

## 2015-10-26 LAB — CBC
HEMATOCRIT: 33.6 % — AB (ref 39.0–52.0)
HEMOGLOBIN: 10.7 g/dL — AB (ref 13.0–17.0)
MCH: 28.5 pg (ref 26.0–34.0)
MCHC: 31.8 g/dL (ref 30.0–36.0)
MCV: 89.6 fL (ref 78.0–100.0)
Platelets: 410 10*3/uL — ABNORMAL HIGH (ref 150–400)
RBC: 3.75 MIL/uL — ABNORMAL LOW (ref 4.22–5.81)
RDW: 18.1 % — AB (ref 11.5–15.5)
WBC: 9.9 10*3/uL (ref 4.0–10.5)

## 2015-10-26 LAB — MAGNESIUM: MAGNESIUM: 1.9 mg/dL (ref 1.7–2.4)

## 2015-10-26 NOTE — Progress Notes (Signed)
   10/26/15 1300  Clinical Encounter Type  Visited With Patient  Visit Type Initial;Spiritual support;Social support  Spiritual Encounters  Spiritual Needs (Introduction)  [place] visited briefly with pt to introduce; pt recently returned from procedure and was resting; [place] will consult with Palliative on collaboration as needed and will follow-up with pt and family for spiritual support. 1:42 PM Gwynn Burly

## 2015-10-26 NOTE — Progress Notes (Signed)
PROGRESS NOTE  Joshu Furukawa KGM:[id number] DOB: [birth year] DOA: 10/18/2015 PCP: Velna Hatchet, MD  HPI/Recap of past 1 hours: 59 year old male with past medical history of paroxysmal atrial fibrillation, colon cancer status post chemotherapy and pulmonary embolus admitted on 10/18 for hypovolemic shock that has been ongoing for the past few weeks secondary to continued diarrhea. Initial blood pressure with a systolic of 60, but lactic acid level normal. Patient noted to have elevated white blood cell count of 24 and a creatinine of 1.7. Started on IV fluids plus Zosyn and placed in stepdown unit. C. difficile cultures negative.  Transferred out of unit by 10/19.  Over the next few days, patient slowly continued to improve. Diarrhea eventually slowed down and then resolved. Patient started becoming hypokalemic and hypomagnesemic by 10/23 requiring replacement. Oncology met with patient and wife to discuss long-term outcomes and patient's CODE STATUS changed to DO NOT RESUSCITATE.    patient continued to have episodes of abdominal discomfort and persistent vomiting. X-ray checked this morning noted early signs of small bowel obstruction. Abdominal distention worsened and he underwent US paracentesis and 9.8 lit of fluid was removed. He was started on clears and he is able to pass flatulence and had two BM today.    Assessment/Plan: Principal Problem:   Hypovolemic shock (HCC) causing hypotension and secondary acute kidney injury: No evidence of sepsis given normal lactic acidosis. Hypotension secondary to diarrhea and continued Lasix use. Creatinine now normalized. IV fluids discontinued and Lasix initially started started. Lasix now on hold due to small bowel obstruction and trying to rehydrate. Replacing potassium accordingly with IV fluids.    Small bowel obstruction: Oncology ordered a CT abd, shows SBO from the colon mass.  Gi re consulted by Dr Benay Spice.  On clear liquid diet, able  to pass flatulence and had 2 BM on 10/26.   Active Problems:   Malnutrition of moderate degree (HCC)   Colon cancer [facility]): Being followed by oncology, no plans for further chemotherapy unless functional status improves. Changed to DO NOT RESUSCITATE. Wife has asked palliative care consult for goals of care. ? Colon stent, further recommendations as per oncology and gastroenterology.     PAF (paroxysmal atrial fibrillation) ([place]): Currently in normal sinus rhythm, already on anticoagulation.     Acute saddle pulmonary embolism with acute cor pulmonale [facility]): Have restarted xarelto    Chronic diastolic congestive heart failure [facility]): Patient now mildly volume overloaded, however given need for hydration for small bowel obstruction, have stopped Lasix and restarted gentle fluids with potassium, .     Anemia of chronic disease: Secondary to cancer. Continue iron     Diarrhea: Unclear etiology. No signs of infection, negative cultures and C. difficile negative. Suspect maybe enteritis? Leukocytosis now normal. Stopped antibiotics after first day. Much improved  Edema: We'll diurese once IV fluids stopped  Code Status: Now DO NOT RESUSCITATE  Family Communication: wife at bedside.   Disposition Plan: Likely here for several more days until small bowel obstruction resolved and diuresed   Consultants:  Oncology  Gastroenterology  Procedures:  CT ABDOMEN and pelvis.   Antibiotics:  Zosyn 10/18-10/19  By mouth Flagyl 10/22-10/24   Objective: BP 102/62 mmHg  Pulse 88  Temp(Src) 97.4 F (36.3 C) (Axillary)  Resp 16  Ht 6' 4"  (1.93 m)  Wt 117.6 kg (259 lb 4.2 oz)  BMI 31.57 kg/m2  SpO2 100%  Intake/Output Summary (Last 24 hours) at 10/26/15 1845 Last data filed at 10/26/15 1500  Gross  per 24 hour  Intake   1040 ml  Output    450 ml  Net    590 ml   Filed Weights   10/24/15 0500 10/25/15 0504 10/26/15 0503  Weight: 116 kg (255 lb 11.7 oz) 116 kg (255 lb 11.7 oz)  117.6 kg (259 lb 4.2 oz)    Exam:   General:  Alert and oriented, not in any distress.   Cardiovascular: Regular rate and rhythm, S1-S2  Respiratory: Clear to auscultation bilaterally, diminished at bases.   Abdomen: Soft, nontender, moderate distention, good bowel sounds  Musculoskeletal: 3+ pitting edema from the knees down  Data Reviewed: Basic Metabolic Panel:  Recent Labs Lab 10/21/15 0430 10/23/15 1030 10/23/15 1035 10/24/15 0344 10/25/15 0438 10/26/15 0437  NA 137  --  138 138 139 140  K 2.9*  --  2.6* 3.2* 3.5 3.9  CL 109  --  112* 111 113* 116*  CO2 19*  --  18* 18* 18* 17*  GLUCOSE 113*  --  122* 139* 170* 142*  BUN 38*  --  34* 35* 40* 42*  CREATININE 1.18  --  0.97 1.02 1.37* 1.43*  CALCIUM 7.8*  --  7.9* 8.2* 8.3* 8.3*  MG  --  1.6*  --   --   --  1.9   Liver Function Tests: No results for input(s): AST, ALT, ALKPHOS, BILITOT, PROT, ALBUMIN in the last 168 hours. No results for input(s): LIPASE, AMYLASE in the last 168 hours. No results for input(s): AMMONIA in the last 168 hours. CBC:  Recent Labs Lab 10/20/15 0730 10/21/15 0430 10/25/15 1000 10/26/15 0437  WBC 7.4 6.9 9.4 9.9  HGB 9.2* 9.0* 10.1* 10.7*  HCT 28.6* 28.8* 31.2* 33.6*  MCV 88.8 88.9 89.1 89.6  PLT 244 246 348 410*   Cardiac Enzymes:   No results for input(s): CKTOTAL, CKMB, CKMBINDEX, TROPONINI in the last 168 hours. BNP (last 3 results) No results for input(s): BNP in the last 8760 hours.  ProBNP (last 3 results) No results for input(s): PROBNP in the last 8760 hours.  CBG:  Recent Labs Lab 10/19/15 2221 10/20/15 0750 10/20/15 1210 10/21/15 0757  GLUCAP 139* 148* 182* 118*    Recent Results (from the past 240 hour(s))  C difficile quick scan w PCR reflex     Status: None   Collection Time: 10/18/15 11:32 AM  Result Value Ref Range Status   C Diff antigen NEGATIVE NEGATIVE Final   C Diff toxin NEGATIVE NEGATIVE Final   C Diff interpretation Negative for  toxigenic C. difficile  Final  Stool culture     Status: None   Collection Time: 10/18/15 11:32 AM  Result Value Ref Range Status   Specimen Description STOOL  Final   Special Requests NONE  Final   Culture   Final    NO SALMONELLA, SHIGELLA, CAMPYLOBACTER, YERSINIA, OR E.COLI 0157:H7 ISOLATED Performed at [facility]    Report Status 10/22/2015 FINAL  Final  Ova and parasite examination     Status: None   Collection Time: 10/18/15 11:32 AM  Result Value Ref Range Status   Specimen Description STOOL  Final   Special Requests NONE  Final   Ova and parasites   Final    NO OVA OR PARASITES SEEN Performed at [facility]    Report Status 10/19/2015 FINAL  Final  MRSA PCR Screening     Status: None   Collection Time: 10/18/15 11:12 PM  Result Value Ref  Range Status   MRSA by PCR NEGATIVE NEGATIVE Final    Comment:        The GeneXpert MRSA Assay (FDA approved for NASAL specimens only), is one component of a comprehensive MRSA colonization surveillance program. It is not intended to diagnose MRSA infection nor to guide or monitor treatment for MRSA infections.   Culture, blood (routine x 2)     Status: None   Collection Time: 10/18/15 11:30 PM  Result Value Ref Range Status   Specimen Description BLOOD RIGHT ARM  Final   Special Requests BOTTLES DRAWN AEROBIC AND ANAEROBIC 5ML  Final   Culture   Final    NO GROWTH 5 DAYS Performed at [facility]    Report Status 10/24/2015 FINAL  Final  Culture, blood (routine x 2)     Status: None   Collection Time: 10/19/15  3:55 AM  Result Value Ref Range Status   Specimen Description BLOOD LEFT ANTECUBITAL  Final   Special Requests BOTTLES DRAWN AEROBIC ONLY 5ML  Final   Culture   Final    NO GROWTH 5 DAYS Performed at [facility]    Report Status 10/24/2015 FINAL  Final     Studies: US Paracentesis  10/26/2015  INDICATION: Stage IV colon cancer, recurrent ascites. Request is made for large  volume therapeutic paracentesis up to 10 liters. EXAM: ULTRASOUND-GUIDED THERAPEUTIC PARACENTESIS COMPARISON:  Prior paracentesis on 10/05/2015 MEDICATIONS: None. COMPLICATIONS: None immediate TECHNIQUE: Informed written consent was obtained from the patient after a discussion of the risks, benefits and alternatives to treatment. A timeout was performed prior to the initiation of the procedure. Initial ultrasound scanning demonstrates a large amount of ascites within the left lower abdominal quadrant. The left lower abdomen was prepped and draped in the usual sterile fashion. 1% lidocaine was used for local anesthesia. Under direct ultrasound guidance, a 19 gauge, 10-cm, Yueh catheter was introduced. An ultrasound image was saved for documentation purposed. The paracentesis was performed. The catheter was removed and a dressing was applied. The patient tolerated the procedure well without immediate post procedural complication. FINDINGS: A total of approximately 9.8 liters of yellow fluid was removed. IMPRESSION: Successful ultrasound-guided therapeutic paracentesis yielding 9.8 liters of peritoneal fluid. Read by: Rowe Robert, PA-C Electronically Signed   By: Aletta Edouard M.D.   On: 10/26/2015 15:16    Scheduled Meds: . antiseptic oral rinse  7 mL Mouth Rinse BID  . enoxaparin (LOVENOX) injection  115 mg Subcutaneous Q12H  . levothyroxine  75 mcg Intravenous Daily    Continuous Infusions: . dextrose 5 % and 0.9 % NaCl with KCl 40 mEq/L 50 mL/hr at 10/26/15 1705     Time spent: 15 minutes  [place] [facility] Pager [phone]. If 7PM-7AM, please contact night-coverage at [email or web address], password [facility] 10/26/2015, 6:45 PM  LOS: 8 days

## 2015-10-26 NOTE — Procedures (Signed)
Ultrasound-guided therapeutic paracentesis performed yielding 9.8 L yellow fluid. No immediate complications.

## 2015-10-26 NOTE — Progress Notes (Signed)
PT Cancellation Note  Patient Details Name: Wynn Alldredge MRN: [id number] DOB: 08-30-1956   Cancelled Treatment:    Reason Eval/Treat Not Completed: Other (comment) (RN reports pt refusing most care this morning as he is anticipating a procedure today.) Will check back as schedule permits.   Jackline Castilla,KATHrine E 10/26/2015, 9:53 AM Carmelia Bake, PT, DPT 10/26/2015 Pager: [phone]

## 2015-10-27 ENCOUNTER — Inpatient Hospital Stay (HOSPITAL_COMMUNITY): Payer: Managed Care, Other (non HMO)

## 2015-10-27 DIAGNOSIS — I2699 Other pulmonary embolism without acute cor pulmonale: Secondary | ICD-10-CM

## 2015-10-27 DIAGNOSIS — D630 Anemia in neoplastic disease: Secondary | ICD-10-CM

## 2015-10-27 DIAGNOSIS — I82403 Acute embolism and thrombosis of unspecified deep veins of lower extremity, bilateral: Secondary | ICD-10-CM

## 2015-10-27 DIAGNOSIS — D701 Agranulocytosis secondary to cancer chemotherapy: Secondary | ICD-10-CM

## 2015-10-27 DIAGNOSIS — I2602 Saddle embolus of pulmonary artery with acute cor pulmonale: Secondary | ICD-10-CM

## 2015-10-27 DIAGNOSIS — F4329 Adjustment disorder with other symptoms: Secondary | ICD-10-CM

## 2015-10-27 DIAGNOSIS — R140 Abdominal distension (gaseous): Secondary | ICD-10-CM | POA: Insufficient documentation

## 2015-10-27 LAB — COMPREHENSIVE METABOLIC PANEL
ALK PHOS: 364 U/L — AB (ref 38–126)
ALT: 11 U/L — AB (ref 17–63)
AST: 23 U/L (ref 15–41)
Albumin: 1.7 g/dL — ABNORMAL LOW (ref 3.5–5.0)
Anion gap: 8 (ref 5–15)
BILIRUBIN TOTAL: 1.3 mg/dL — AB (ref 0.3–1.2)
BUN: 43 mg/dL — AB (ref 6–20)
CALCIUM: 8.2 mg/dL — AB (ref 8.9–10.3)
CO2: 19 mmol/L — ABNORMAL LOW (ref 22–32)
CREATININE: 1.45 mg/dL — AB (ref 0.61–1.24)
Chloride: 114 mmol/L — ABNORMAL HIGH (ref 101–111)
GFR, EST AFRICAN AMERICAN: 59 mL/min — AB (ref >60)
GFR, EST NON AFRICAN AMERICAN: 51 mL/min — AB (ref >60)
Glucose, Bld: 170 mg/dL — ABNORMAL HIGH (ref 65–99)
Potassium: 4.3 mmol/L (ref 3.5–5.1)
Sodium: 141 mmol/L (ref 135–145)
TOTAL PROTEIN: 5.6 g/dL — AB (ref 6.5–8.1)

## 2015-10-27 LAB — CBC
HCT: 36.2 % — ABNORMAL LOW (ref 39.0–52.0)
HEMOGLOBIN: 11.4 g/dL — AB (ref 13.0–17.0)
MCH: 28.3 pg (ref 26.0–34.0)
MCHC: 31.5 g/dL (ref 30.0–36.0)
MCV: 89.8 fL (ref 78.0–100.0)
PLATELETS: 379 10*3/uL (ref 150–400)
RBC: 4.03 MIL/uL — AB (ref 4.22–5.81)
RDW: 18 % — ABNORMAL HIGH (ref 11.5–15.5)
WBC: 12.1 10*3/uL — ABNORMAL HIGH (ref 4.0–10.5)

## 2015-10-27 MED ORDER — SODIUM CHLORIDE 0.9 % IV BOLUS (SEPSIS)
250.0000 mL | Freq: Once | INTRAVENOUS | Status: AC
  Administered 2015-10-27: 250 mL via INTRAVENOUS

## 2015-10-27 MED ORDER — SODIUM CHLORIDE 0.9 % IV SOLN
INTRAVENOUS | Status: DC
  Administered 2015-10-27 – 2015-10-28 (×4): via INTRAVENOUS

## 2015-10-27 MED ORDER — LORAZEPAM 2 MG/ML IJ SOLN
1.0000 mg | Freq: Once | INTRAMUSCULAR | Status: AC
  Administered 2015-10-27: 1 mg via INTRAVENOUS
  Filled 2015-10-27: qty 1

## 2015-10-27 MED ORDER — LORAZEPAM 2 MG/ML IJ SOLN
1.0000 mg | Freq: Four times a day (QID) | INTRAMUSCULAR | Status: DC | PRN
  Administered 2015-10-28 – 2015-11-01 (×5): 1 mg via INTRAVENOUS
  Filled 2015-10-27 (×5): qty 1

## 2015-10-27 MED ORDER — LIDOCAINE HCL 2 % EX GEL
1.0000 "application " | Freq: Once | CUTANEOUS | Status: AC
  Administered 2015-10-27: 1 via TOPICAL
  Filled 2015-10-27 (×2): qty 5

## 2015-10-27 NOTE — Progress Notes (Signed)
[facility] Progress Note  Subjective:  S/P paracentesis yesterday yielding 9.8 L yellow fluid.Abd film this morning:Scattered large and small bowel gas is noted. Small bowel dilatation consistent with obstruction is noted. This is similar to that seen on recent CT examination. Generalized increased density in the abdomen is noted related to a mild degree of ascites. No free air is seen. A biliary stent is noted.   Says he felt a little better last night but is very nauseous this morning and has vomited.   Objective:  Vital signs in last 24 hours: Temp:  [97 F (36.1 C)-97.7 F (36.5 C)] 97 F (36.1 C) (10/27 0358) Pulse Rate:  [88-100] 100 (10/27 0358) Resp:  [16-20] 20 (10/27 0358) BP: (90-108)/(55-70) 92/55 mmHg (10/27 0358) SpO2:  [98 %-100 %] 98 % (10/27 0358) Weight:  [235 lb 0.2 oz (106.6 kg)] 235 lb 0.2 oz (106.6 kg) (10/27 0358) Last BM Date: 10/27/15 (large amount of loose liquid stool) General:   Alert,  Well-developed,    in NAD Heart:  Regular rate and rhythm; no murmurs Pulm;lungs clear Abdomen:  Soft, nontender, mildly distended. Normal bowel sounds, without guarding, and without rebound.   Extremities:  3+ edema from knees down Neurologic:  Alert and  oriented x4;  grossly normal neurologically. Psych: Alert and cooperative. Normal mood and affect.  Intake/Output from previous day: 10/26 0701 - 10/27 0700 In: 1016.7 [P.O.:120; I.V.:896.7] Out: 200 [Urine:200] Intake/Output this shift:    Lab Results:  Recent Labs  10/25/15 1000 10/26/15 0437 10/27/15 0458  WBC 9.4 9.9 12.1*  HGB 10.1* 10.7* 11.4*  HCT 31.2* 33.6* 36.2*  PLT 348 410* 379   BMET  Recent Labs  10/25/15 0438 10/26/15 0437 10/27/15 0458  NA 139 140 141  K 3.5 3.9 4.3  CL 113* 116* 114*  CO2 18* 17* 19*  GLUCOSE 170* 142* 170*  BUN 40* 42* 43*  CREATININE 1.37* 1.43* 1.45*  CALCIUM 8.3* 8.3* 8.2*   LFT  Recent Labs  10/27/15 0458  PROT 5.6*  ALBUMIN  1.7*  AST 23  ALT 11*  ALKPHOS 364*  BILITOT 1.3*    Ct Abdomen Pelvis Wo Contrast  10/25/2015  CLINICAL DATA:  Evaluate for small bowel obstruction. Elevated white blood cell count. Vomiting. EXAM: CT ABDOMEN AND PELVIS WITHOUT CONTRAST TECHNIQUE: Multidetector CT imaging of the abdomen and pelvis was performed following the standard protocol without IV contrast. COMPARISON:  10/18/2015 FINDINGS: Lower chest: There are small bilateral pleural effusions identified left greater than right. Overlying atelectasis noted. Pulmonary nodules are again noted compatible with metastatic disease. Hepatobiliary: Extensive liver metastasis is identified throughout both lobes of the liver involving greater than 70% of the liver parenchyma. This appears similar to previous exam. Gallstones are again noted. There is a stent within the common bile duct. Pancreas: Atrophy of the pancreas. Spleen: Normal appearance of the spleen. Adrenals/Urinary Tract: The left adrenal gland is normal. Similar appearance of right adrenal nodule measuring 2.1 cm, image 36 of series 2. Kidneys are unremarkable. Urinary bladder appears normal. Stomach/Bowel: Normal appearance of the stomach. Abnormal small and large bowel dilatation is again noted. Numerous small bowel fluid levels are identified. Colonic distention extends to the level of the sigmoid colon were there is a solid-appearing mass measuring approximately 4.7 cm, image 78/series 2. Enteric contrast material is identified within the rectum, likely from CT dated 10/18/2015. The rectum has a normal caliber. There is a rectal tube in place. Vascular/Lymphatic: Calcified atherosclerotic  disease involves the abdominal aorta. No aneurysm. Pre aortic lymph node within the retroperitoneum measures 9 mm, image 48 of series 2. Similar to previous exam. No significant pelvic or inguinal adenopathy. Reproductive: Prostate gland and seminal vesicles difficult to visualize. Other: There is a  marked amount of ascites within the abdomen and pelvis. Musculoskeletal: Lytic bone metastases involving the left iliac bone is again noted. This extends to the roof of the acetabulum and measures 5.6 cm, image 94 of series 2. Previously 5.6 cm. This may be of orthopedic significance. IMPRESSION: 1. Abnormal bowel distension. Favor partial mechanical obstruction secondary to sigmoid colon mass. 2. Diffuse liver metastasis. 3. Pulmonary nodules worrisome for metastatic disease 4. Large lytic lesion within the left iliac bone is identified. This may be of orthopedic significance. 5. Marked amount of abdominal ascites. This may reflect liver dysfunction secondary to extensive metastatic disease and/or peritoneal metastasis. 6. These results will be called to the ordering clinician or representative by the Radiologist Assistant, and communication documented in the PACS or zVision Dashboard. Electronically Signed   By: Kerby Moors M.D.   On: 10/25/2015 15:30   US Paracentesis  10/26/2015  INDICATION: Stage IV colon cancer, recurrent ascites. Request is made for large volume therapeutic paracentesis up to 10 liters. EXAM: ULTRASOUND-GUIDED THERAPEUTIC PARACENTESIS COMPARISON:  Prior paracentesis on 10/05/2015 MEDICATIONS: None. COMPLICATIONS: None immediate TECHNIQUE: Informed written consent was obtained from the patient after a discussion of the risks, benefits and alternatives to treatment. A timeout was performed prior to the initiation of the procedure. Initial ultrasound scanning demonstrates a large amount of ascites within the left lower abdominal quadrant. The left lower abdomen was prepped and draped in the usual sterile fashion. 1% lidocaine was used for local anesthesia. Under direct ultrasound guidance, a 19 gauge, 10-cm, Yueh catheter was introduced. An ultrasound image was saved for documentation purposed. The paracentesis was performed. The catheter was removed and a dressing was applied. The  patient tolerated the procedure well without immediate post procedural complication. FINDINGS: A total of approximately 9.8 liters of yellow fluid was removed. IMPRESSION: Successful ultrasound-guided therapeutic paracentesis yielding 9.8 liters of peritoneal fluid. Read by: Rowe Robert, PA-C Electronically Signed   By: Aletta Edouard M.D.   On: 10/26/2015 15:16   Dg Abd 2 Views  10/27/2015  CLINICAL DATA:  Abdominal pain, known history of hepatic metastatic disease EXAM: ABDOMEN - 2 VIEW COMPARISON:  10/25/2015 FINDINGS: Scattered large and small bowel gas is noted. Small bowel dilatation consistent with obstruction is noted. This is similar to that seen on recent CT examination. Generalized increased density in the abdomen is noted related to a mild degree of ascites. No free air is seen. A biliary stent is noted. IMPRESSION: Changes consistent with bowel obstruction stable from the prior exam. Electronically Signed   By: Inez Catalina M.D.   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  2) Massive ascites--had paracentesis with removal of 9.8 liters fluid yesterday. Still nauseous, vomiting. Abd films today with scattered large and small bowel gas. Small bowel dilatation consistent with obstruction is noted. Will review with attending as to further plans, ? Stent in sigmoid?    LOS: 9 days   Hvozdovic, Vita Barley PA-C 10/27/2015, Pager [phone] Mon-Fri 8a-5p [phone] after 5p, weekends, holidays   GI Attending  I have also seen and assessed  the patient and agree with the advanced practitioner's assessment and plan. He is having diarrhea from now partial but still sig colonic obstruction. I have offered attempt at palliative colonic stent and will try that tomorrow. Will hold noon enoxaparin tomorrow and give after stent in. Also will try NG tube  to LIWS.  The risks and benefits as well as alternatives of endoscopic procedure(s) have been discussed and reviewed. All questions answered. The patient agrees to proceed.   Will also ask palliative care or care management to follow-up - hospice seems like a reasonable option for this patient at dc.  Gatha Mayer, MD, [facility] [phone] (pager) 10/27/2015 1:07 PM

## 2015-10-27 NOTE — Progress Notes (Signed)
Pt has had no urine output during the whole shift. This was the case yesterday. MD made aware. Callie Fielding RN

## 2015-10-27 NOTE — Progress Notes (Signed)
Daily Progress Note   Patient Name: Jaime [name]       Date: 10/27/2015 DOB: 29-Jun-1956  Age: 59 y.o. MRN#: [id number] Attending Physician: Hosie Poisson, MD Primary Care Physician: Velna Hatchet, MD Admit Date: 10/18/2015  Reason for Consultation/Follow-up: Establishing goals of care and Psychosocial/spiritual support  Subjective:     -continued conversation with patient and his wife regarding current medical situation, treatment options, symptom management.  -discussed concept of full comfort path  -offered emotional support empathtic listening  -questions and concerns addressed   Length of Stay: 9 days  Current Medications: Scheduled Meds:  . antiseptic oral rinse  7 mL Mouth Rinse BID  . enoxaparin (LOVENOX) injection  115 mg Subcutaneous Q12H  . levothyroxine  75 mcg Intravenous Daily    Continuous Infusions: . sodium chloride 100 mL/hr at 10/27/15 1327    PRN Meds: morphine injection, ondansetron (ZOFRAN) IV, promethazine, sodium chloride  Palliative Performance Scale: 20 % at best     Vital Signs: BP 92/55 mmHg  Pulse 100  Temp(Src) 97 F (36.1 C) (Axillary)  Resp 20  Ht 6\' 4"  (1.93 m)  Wt 106.6 kg (235 lb 0.2 oz)  BMI 28.62 kg/m2  SpO2 98% SpO2: SpO2: 98 % O2 Device: O2 Device: Not Delivered O2 Flow Rate:    Intake/output summary:  Intake/Output Summary (Last 24 hours) at 10/27/15 1335 Last data filed at 10/27/15 0357  Gross per 24 hour  Intake 896.67 ml  Output    200 ml  Net 696.67 ml   LBM: Last BM Date: 10/27/15 Baseline Weight: Weight: 109.2 kg (240 lb 11.9 oz) Most recent weight: Weight: 106.6 kg (235 lb 0.2 oz)  Physical Exam:           General: ill appearing,  HEENT: moist buccal membranes [id number] Resp: decreased in bases Abd: distended, firm Skin: warm and dry Neuro: lethargic, oriented X3   Additional Data Reviewed: Recent Labs     10/26/15  0437  10/27/15  0458  WBC  9.9  12.1*  HGB  10.7*  11.4*    PLT  410*  379  NA  140  141  BUN  42*  43*  CREATININE  1.43*  1.45*     Problem List:  Patient Active Problem List   Diagnosis Date Noted  . Bowel obstruction ([place])   . Weakness generalized   . Palliative care encounter 10/25/2015  . Cancer of sigmoid colon ([place])   . Colon stricture ([place])   . SBO (small bowel obstruction) ([place]) 10/24/2015  . Abnormal x-ray of abdomen   . Pressure ulcer 10/20/2015  . Hypovolemic shock ([place]) 10/19/2015  . Diarrhea 10/19/2015  . Hypothyroidism 10/19/2015  . Acute kidney injury ([place])   . AKI (acute kidney injury) ([place]) 10/18/2015  . Hypotension 10/18/2015  . Chronic diastolic congestive heart failure ([place]) 10/09/2015  . Anemia of chronic disease 10/09/2015  . Ascites   . PAF (paroxysmal atrial fibrillation) ([place]) 10/03/2015  . Acute saddle pulmonary embolism with acute cor pulmonale (H[facility])   . Colon cancer ([place]) 07/26/2015  . Malnutrition of moderate degree ([place]) 07/18/2015     Palliative Care Assessment & Plan    Code Status:  DNR  Goals of Care:  Open to all offered and available medical interventions to prolong life.  Remains hopeful for improvement and continued chemotherapy is possible Plan is to place rectal tube, NG tube for decompression and stent tomorrow  Desire for further Chaplaincy support: strong community church support  Symptom Management:  Anxiety: Ativan 1 Mg IV every 6 hrs prn/utilize prior to NG placement   Prognosis: < 2 weeks  5. Discharge Planning: Pending  Care plan was discussed with Dr Learta Codding and Dr Karleen Hampshire  Thank you for allowing the Palliative Medicine Team to assist in the care of this patient.   Time In:  1600 Time Out: 1635 Total Time 35 min Prolonged Time Billed  no    Greater than 50%  of this time was spent counseling and coordinating care related to the above assessment and plan.     Knox Royalty, NP  10/27/2015, 1:35 PM  Please contact Palliative Medicine Team phone at [phone]  for questions and concerns.

## 2015-10-27 NOTE — Progress Notes (Signed)
PT Cancellation Note  Patient Details Name: Jaime [name] MRN: [id number] DOB: 03-28-1956   Cancelled Treatment:    Reason Eval/Treat Not Completed: Patient at procedure or test/unavailable;Other. Pt out of room for procedure. Spoke with RN who reported pt was having loose stools and vomiting this am.  RN did not feel pt would participate well on today. Will check back another day. Thanks.

## 2015-10-27 NOTE — Care Management Note (Signed)
Case Management Note  Patient Details  Name: Jaime [name] MRN: [id number] Date of Birth: [birth year]  Subjective/Objective:  S/p paracentesis yesterday. For palliative colonic stent in am. Noted having loose stools,n/v.onc/gi/sx/palliative following.From SNF.                  Action/Plan:Current d/c plan return SNF.   Expected Discharge Date:   (UNKNOWN)               Expected Discharge Plan:  Skilled Nursing Facility  In-House Referral:  Clinical Social Work  Discharge planning Services  CM Consult  Post Acute Care Choice:  NA Choice offered to:  NA  DME Arranged:    DME Agency:     HH Arranged:    [place] Agency:     Status of Service:  In process, will continue to follow  Medicare Important Message Given:    Date Medicare IM Given:    Medicare IM give by:    Date Additional Medicare IM Given:    Additional Medicare Important Message give by:     If discussed at [place] of Stay Meetings, dates discussed:    Additional Comments:  Dessa Phi, RN 10/27/2015, 4:10 PM

## 2015-10-27 NOTE — Progress Notes (Signed)
IP PROGRESS NOTE  Subjective:   Nurse reports 5-6 large volume loose stools beginning during the night. He is also experiencing nausea/vomiting. Minimal oral intake.  Objective: Vital signs in last 24 hours: Blood pressure 92/55, pulse 100, temperature 97 F (36.1 C), temperature source Axillary, resp. rate 20, height 6\' 4"  (1.93 m), weight 235 lb 0.2 oz (106.6 kg), SpO2 98 %.  Intake/Output from previous day: 10/26 0701 - 10/27 0700 In: 1016.7 [P.O.:120; I.V.:896.7] Out: 200 [Urine:200]  Physical Exam:  He is awake. Follows commands. CV: Regular rate and rhythm. Abdomen: Distended. Bowel sounds present. Extremities: Pitting edema throughout the left greater than right leg.    BMET  Recent Labs  10/26/15 0437 10/27/15 0458  NA 140 141  K 3.9 4.3  CL 116* 114*  CO2 17* 19*  GLUCOSE 142* 170*  BUN 42* 43*  CREATININE 1.43* 1.45*  CALCIUM 8.3* 8.2*    Studies/Results: Ct Abdomen Pelvis Wo Contrast  10/25/2015  CLINICAL DATA:  Evaluate for small bowel obstruction. Elevated white blood cell count. Vomiting. EXAM: CT ABDOMEN AND PELVIS WITHOUT CONTRAST TECHNIQUE: Multidetector CT imaging of the abdomen and pelvis was performed following the standard protocol without IV contrast. COMPARISON:  10/18/2015 FINDINGS: Lower chest: There are small bilateral pleural effusions identified left greater than right. Overlying atelectasis noted. Pulmonary nodules are again noted compatible with metastatic disease. Hepatobiliary: Extensive liver metastasis is identified throughout both lobes of the liver involving greater than 70% of the liver parenchyma. This appears similar to previous exam. Gallstones are again noted. There is a stent within the common bile duct. Pancreas: Atrophy of the pancreas. Spleen: Normal appearance of the spleen. Adrenals/Urinary Tract: The left adrenal gland is normal. Similar appearance of right adrenal nodule measuring 2.1 cm, image 36 of series 2. Kidneys are  unremarkable. Urinary bladder appears normal. Stomach/Bowel: Normal appearance of the stomach. Abnormal small and large bowel dilatation is again noted. Numerous small bowel fluid levels are identified. Colonic distention extends to the level of the sigmoid colon were there is a solid-appearing mass measuring approximately 4.7 cm, image 78/series 2. Enteric contrast material is identified within the rectum, likely from CT dated 10/18/2015. The rectum has a normal caliber. There is a rectal tube in place. Vascular/Lymphatic: Calcified atherosclerotic disease involves the abdominal aorta. No aneurysm. Pre aortic lymph node within the retroperitoneum measures 9 mm, image 48 of series 2. Similar to previous exam. No significant pelvic or inguinal adenopathy. Reproductive: Prostate gland and seminal vesicles difficult to visualize. Other: There is a marked amount of ascites within the abdomen and pelvis. Musculoskeletal: Lytic bone metastases involving the left iliac bone is again noted. This extends to the roof of the acetabulum and measures 5.6 cm, image 94 of series 2. Previously 5.6 cm. This may be of orthopedic significance. IMPRESSION: 1. Abnormal bowel distension. Favor partial mechanical obstruction secondary to sigmoid colon mass. 2. Diffuse liver metastasis. 3. Pulmonary nodules worrisome for metastatic disease 4. Large lytic lesion within the left iliac bone is identified. This may be of orthopedic significance. 5. Marked amount of abdominal ascites. This may reflect liver dysfunction secondary to extensive metastatic disease and/or peritoneal metastasis. 6. These results will be called to the ordering clinician or representative by the Radiologist Assistant, and communication documented in the PACS or zVision Dashboard. Electronically Signed   By: Kerby Moors M.D.   On: 10/25/2015 15:30   US Paracentesis  10/26/2015  INDICATION: Stage IV colon cancer, recurrent ascites. Request is made for large  volume  therapeutic paracentesis up to 10 liters. EXAM: ULTRASOUND-GUIDED THERAPEUTIC PARACENTESIS COMPARISON:  Prior paracentesis on 10/05/2015 MEDICATIONS: None. COMPLICATIONS: None immediate TECHNIQUE: Informed written consent was obtained from the patient after a discussion of the risks, benefits and alternatives to treatment. A timeout was performed prior to the initiation of the procedure. Initial ultrasound scanning demonstrates a large amount of ascites within the left lower abdominal quadrant. The left lower abdomen was prepped and draped in the usual sterile fashion. 1% lidocaine was used for local anesthesia. Under direct ultrasound guidance, a 19 gauge, 10-cm, Yueh catheter was introduced. An ultrasound image was saved for documentation purposed. The paracentesis was performed. The catheter was removed and a dressing was applied. The patient tolerated the procedure well without immediate post procedural complication. FINDINGS: A total of approximately 9.8 liters of yellow fluid was removed. IMPRESSION: Successful ultrasound-guided therapeutic paracentesis yielding 9.8 liters of peritoneal fluid. Read by: Rowe Robert, PA-C Electronically Signed   By: Aletta Edouard M.D.   On: 10/26/2015 15:16   Dg Abd 2 Views  10/27/2015  CLINICAL DATA:  Abdominal pain, known history of hepatic metastatic disease EXAM: ABDOMEN - 2 VIEW COMPARISON:  10/25/2015 FINDINGS: Scattered large and small bowel gas is noted. Small bowel dilatation consistent with obstruction is noted. This is similar to that seen on recent CT examination. Generalized increased density in the abdomen is noted related to a mild degree of ascites. No free air is seen. A biliary stent is noted. IMPRESSION: Changes consistent with bowel obstruction stable from the prior exam. Electronically Signed   By: Inez Catalina M.D.   On: 10/27/2015 10:40    Medications: I have reviewed the patient's current medications.  Assessment/Plan: 1. 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, status post a colonoscopy 07/21/2015 confirming a sigmoid colon mass  Staging CT scans 07/13/2015 and 07/14/2015 confirmed extensive liver metastases, lung nodules, a right pleural effusion, and porta hepatis lymphadenopathy  Cycle 1 FOLFOX (chemotherapy doses reduced and bolus 5-FU eliminated) 08/02/2015  Cycle 2 FOLFOX (chemotherapy doses reduced and bolus 5-FU eliminated) 08/16/2015  Cycle 3 FOLFOX (Avastin added, bolus 5-FU introduced, and infusional 5-FU dose escalated) 08/30/2015  Cycle 4 FOLFOX plus Avastin 09/13/2015  Cycle 5 held on 09/27/2015 due to mild neutropenia, recent diarrhea.  CT of the chest 10/02/2015, compared to a CT from 07/13/2015-no significant change in lung nodules or liver metastases  Diagnostic/therapeutic paracentesis 10/05/2015-negative cytology  CEA lower on 10/11/2015  CT 10/18/2015 with mild progression of liver metastases and lung lesions  2. Obstructive jaundice, likely secondary to porta hepatis lymphadenopathy, status post placement of a bile duct stent on 07/14/2015  3. Microcytic anemia secondary to the colon mass-improved  4. Diabetes  5. Failure to thrive secondary to metastatic colon cancer  6. Port-A-Cath placement 08/01/2015  7. Nausea following cycle 1 and cycle 2 FOLFOX-emend added with cycle 3  8. Diarrhea following cycle 4 FOLFOX/Avastin.  9. Neutropenia following cycle 4 FOLFOX/Avastin. Neulasta to be added with cycle 5.  10. Admission 10/02/2015 with bilateral DVTs and pulmonary emboli-maintained on Xarelto 10/07/2015  11. Diarrhea-C. difficile negative, etiology unclear  12. Hypotension-likely secondary to dehydration, improved  13.  Nausea/vomiting-plain x-ray suggestive of a small bowel obstruction  14.   CT 10/25/2015 with abnormal bowel distention partial mechanical obstruction favored secondary to sigmoid colon mass. Diffuse liver metastasis. Pulmonary  nodules worrisome for metastatic disease. Large lytic lesion left iliac bone. Marked amount of ascites.  15.   Status post paracentesis 10/26/2015  with 9.8 L of fluid removed.  Jaime [name] is now experiencing large volume diarrhea. The plan is for a palliative colonic stent 10/28/2015.     LOS: 9 days   Ned Card  10/27/2015, 1:20 PM  Jaime [name] was interviewed and examined. He continues to have clinical evidence of a bowel obstruction with recurrent diarrhea and nausea/vomiting. He is being scheduled for placement of a sigmoid stent by Dr. Carlean Purl. He will undergo placement of an NG tube today.  I discussed the overall prognosis with Jaime [name] and his wife. He understands that he will not able to receive further chemotherapy the obstruction is not relieved and his performance status does not improve. We discussed the recommendation for Hospice care if his performance status remains poor despite placement of a colonic stent.

## 2015-10-27 NOTE — Progress Notes (Signed)
PROGRESS NOTE  Jaime [name] KRC:[id number] DOB: Jul 08, 1956 DOA: 10/18/2015 PCP: Velna Hatchet, MD  HPI/Recap of past 9 hours: 59 year old male with past medical history of paroxysmal atrial fibrillation, colon cancer status post chemotherapy and pulmonary embolus admitted on 10/18 for hypovolemic shock that has been ongoing for the past few weeks secondary to continued diarrhea. Initial blood pressure with a systolic of 60, but lactic acid level normal. Patient noted to have elevated white blood cell count of 24 and a creatinine of 1.7. Started on IV fluids plus Zosyn and placed in stepdown unit. C. difficile cultures negative.  Transferred out of unit by 10/19.  Over the next few days, patient slowly continued to improve. Diarrhea eventually slowed down and then resolved. Patient started becoming hypokalemic and hypomagnesemic by 10/23 requiring replacement. Oncology met with patient and wife to discuss long-term outcomes and patient's CODE STATUS changed to DO NOT RESUSCITATE.    patient continued to have episodes of abdominal discomfort and persistent vomiting. X-ray checked this morning noted early signs of small bowel obstruction. Abdominal distention worsened and he underwent US paracentesis and 9.8 lit of fluid was removed. He was started on clears and he is able to pass flatulence and had two BM today.    Assessment/Plan: Principal Problem:   Hypovolemic shock (HCC) causing hypotension and secondary acute kidney injury: No evidence of sepsis given normal lactic acidosis. Hypotension secondary to diarrhea and continued Lasix use. Creatinine now normalized. IV fluids discontinued and Lasix initially started started. Lasix now on hold due to small bowel obstruction and trying to rehydrate. Replacing potassium accordingly with IV fluids.    Small bowel obstruction: Oncology ordered a CT abd, shows SBO from the colon mass.  Gi re consulted by Dr Benay Spice.  On clear liquid diet, able  to pass flatulence and started on profuse diarrhea requiring rectal tube.   Active Problems:   Malnutrition of moderate degree (HCC)   Colon cancer [facility]): Being followed by oncology, no plans for further chemotherapy unless functional status improves. Changed to DO NOT RESUSCITATE. Wife has asked palliative care consult for goals of care. ? Colon stent, further recommendations as per oncology and gastroenterology.  Possibly tomorrow.     PAF (paroxysmal atrial fibrillation) ([place]): Currently in normal sinus rhythm, already on anticoagulation.     Acute saddle pulmonary embolism with acute cor pulmonale [facility]): Have restarted xarelto    Chronic diastolic congestive heart failure [facility]): Patient now mildly volume overloaded, however given need for hydration for small bowel obstruction, have stopped Lasix and restarted gentle fluids with potassium, .     Anemia of chronic disease: Secondary to cancer. Continue iron     Diarrhea: Unclear etiology. No signs of infection, negative cultures and C. difficile negative. Suspect maybe enteritis? Leukocytosis now normal. Stopped antibiotics after first day. Much improved  Edema: We'll diurese once IV fluids stopped  Code Status: Now DO NOT RESUSCITATE  Family Communication: wife at bedside.   Disposition Plan:  Colon stent in am, and monitor.    Consultants:  Oncology  Gastroenterology  Procedures:  CT ABDOMEN and pelvis.   Antibiotics:  Zosyn 10/18-10/19  By mouth Flagyl 10/22-10/24   Objective: BP 91/64 mmHg  Pulse 98  Temp(Src) 97.4 F (36.3 C) (Oral)  Resp 18  Ht 6' 4"  (1.93 m)  Wt 106.6 kg (235 lb 0.2 oz)  BMI 28.62 kg/m2  SpO2 98%  Intake/Output Summary (Last 24 hours) at 10/27/15 1912 Last data filed at 10/27/15 1900  Gross  per 24 hour  Intake 1538.67 ml  Output    150 ml  Net 1388.67 ml   Filed Weights   10/25/15 0504 10/26/15 0503 10/27/15 0358  Weight: 116 kg (255 lb 11.7 oz) 117.6 kg (259 lb 4.2 oz) 106.6  kg (235 lb 0.2 oz)    Exam:   General:  Alert and oriented, not in any distress.   Cardiovascular: Regular rate and rhythm, S1-S2  Respiratory: Clear to auscultation bilaterally, diminished at bases.   Abdomen: Soft, nontender, moderate distention, good bowel sounds  Musculoskeletal: 3+ pitting edema from the knees down  Data Reviewed: Basic Metabolic Panel:  Recent Labs Lab 10/23/15 1030 10/23/15 1035 10/24/15 0344 10/25/15 0438 10/26/15 0437 10/27/15 0458  NA  --  138 138 139 140 141  K  --  2.6* 3.2* 3.5 3.9 4.3  CL  --  112* 111 113* 116* 114*  CO2  --  18* 18* 18* 17* 19*  GLUCOSE  --  122* 139* 170* 142* 170*  BUN  --  34* 35* 40* 42* 43*  CREATININE  --  0.97 1.02 1.37* 1.43* 1.45*  CALCIUM  --  7.9* 8.2* 8.3* 8.3* 8.2*  MG 1.6*  --   --   --  1.9  --    Liver Function Tests:  Recent Labs Lab 10/27/15 0458  AST 23  ALT 11*  ALKPHOS 364*  BILITOT 1.3*  PROT 5.6*  ALBUMIN 1.7*   No results for input(s): LIPASE, AMYLASE in the last 168 hours. No results for input(s): AMMONIA in the last 168 hours. CBC:  Recent Labs Lab 10/21/15 0430 10/25/15 1000 10/26/15 0437 10/27/15 0458  WBC 6.9 9.4 9.9 12.1*  HGB 9.0* 10.1* 10.7* 11.4*  HCT 28.8* 31.2* 33.6* 36.2*  MCV 88.9 89.1 89.6 89.8  PLT 246 348 410* 379   Cardiac Enzymes:   No results for input(s): CKTOTAL, CKMB, CKMBINDEX, TROPONINI in the last 168 hours. BNP (last 3 results) No results for input(s): BNP in the last 8760 hours.  ProBNP (last 3 results) No results for input(s): PROBNP in the last 8760 hours.  CBG:  Recent Labs Lab 10/21/15 0757  GLUCAP 118*    Recent Results (from the past 240 hour(s))  C difficile quick scan w PCR reflex     Status: None   Collection Time: 10/18/15 11:32 AM  Result Value Ref Range Status   C Diff antigen NEGATIVE NEGATIVE Final   C Diff toxin NEGATIVE NEGATIVE Final   C Diff interpretation Negative for toxigenic C. difficile  Final  Stool culture      Status: None   Collection Time: 10/18/15 11:32 AM  Result Value Ref Range Status   Specimen Description STOOL  Final   Special Requests NONE  Final   Culture   Final    NO SALMONELLA, SHIGELLA, CAMPYLOBACTER, YERSINIA, OR E.COLI 0157:H7 ISOLATED Performed at [facility]    Report Status 10/22/2015 FINAL  Final  Ova and parasite examination     Status: None   Collection Time: 10/18/15 11:32 AM  Result Value Ref Range Status   Specimen Description STOOL  Final   Special Requests NONE  Final   Ova and parasites   Final    NO OVA OR PARASITES SEEN Performed at [facility]    Report Status 10/19/2015 FINAL  Final  MRSA PCR Screening     Status: None   Collection Time: 10/18/15 11:12 PM  Result Value Ref Range Status  MRSA by PCR NEGATIVE NEGATIVE Final    Comment:        The GeneXpert MRSA Assay (FDA approved for NASAL specimens only), is one component of a comprehensive MRSA colonization surveillance program. It is not intended to diagnose MRSA infection nor to guide or monitor treatment for MRSA infections.   Culture, blood (routine x 2)     Status: None   Collection Time: 10/18/15 11:30 PM  Result Value Ref Range Status   Specimen Description BLOOD RIGHT ARM  Final   Special Requests BOTTLES DRAWN AEROBIC AND ANAEROBIC 5ML  Final   Culture   Final    NO GROWTH 5 DAYS Performed at [facility]    Report Status 10/24/2015 FINAL  Final  Culture, blood (routine x 2)     Status: None   Collection Time: 10/19/15  3:55 AM  Result Value Ref Range Status   Specimen Description BLOOD LEFT ANTECUBITAL  Final   Special Requests BOTTLES DRAWN AEROBIC ONLY 5ML  Final   Culture   Final    NO GROWTH 5 DAYS Performed at [facility]    Report Status 10/24/2015 FINAL  Final     Studies: Dg Abd 2 Views  10/27/2015  CLINICAL DATA:  Abdominal pain, known history of hepatic metastatic disease EXAM: ABDOMEN - 2 VIEW COMPARISON:  10/25/2015  FINDINGS: Scattered large and small bowel gas is noted. Small bowel dilatation consistent with obstruction is noted. This is similar to that seen on recent CT examination. Generalized increased density in the abdomen is noted related to a mild degree of ascites. No free air is seen. A biliary stent is noted. IMPRESSION: Changes consistent with bowel obstruction stable from the prior exam. Electronically Signed   By: Inez Catalina M.D.   On: 10/27/2015 10:40    Scheduled Meds: . antiseptic oral rinse  7 mL Mouth Rinse BID  . enoxaparin (LOVENOX) injection  115 mg Subcutaneous Q12H  . levothyroxine  75 mcg Intravenous Daily  . sodium chloride  250 mL Intravenous Once    Continuous Infusions: . sodium chloride 100 mL/hr at 10/27/15 1327     Time spent: 15 minutes  [facility] Pager [phone]. If 7PM-7AM, please contact night-coverage at [email or web address], password [facility] 10/27/2015, 7:12 PM  LOS: 9 days

## 2015-10-27 NOTE — Progress Notes (Addendum)
   10/27/15 1200  Clinical Encounter Type  Visited With Patient and family together;Family  Visit Type Initial;Psychological support;Spiritual support;Other (Comment) (oncology, palliative )  Referral From Nurse  Consult/Referral To Nurse;Chaplain  Recommendations continued emotional support around progression of illness, care decisions  Spiritual Encounters  Spiritual Needs Emotional  Stress Factors  Patient Stress Factors Health changes;Exhausted  Family Stress Factors Exhausted;Financial concerns;Loss of control;Major life changes    Chaplain referral from nursing - requesting support for pt and spouse.   Pt, Lantz, asleep on chaplain arrival, waking periodically to acknowledge presence and thank chaplain.  Spouse, Jeani Hawking, at bedside.   Chaplain introduced spiritual care, provided emotional and spiritual support around pt's illness and hospitalizations.    Jeani Hawking communicated understanding of acuity of Arel's illness.  Spoke with chaplain about "feeling like we had more time."  Engaged in life review and grief work around hopes and dreams they shared together.   This is her second marriage and his first.  They have been together 11 years - introduced by a friend of Abdoulaye's.  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       Jeani Hawking reflected on her inability to care for [place] at home.  Chaplain will follow up for emotional support around this reality in 1:1, as Jeani Hawking expressed some disappointment, but appeared reticent to delve into this while seated next to Legrand Como.   She expressed comfort in knowing "he would not want to live like this," and his decision to be DNR.    In speaking about possible referral to [facility], Jeani Hawking asked "I don't know how people make that decision... I don't know when we are ready for that."  Chaplain provided context for  palliative interventions by drawing attention to what they want for their lives and the time they have together.    Jeani Hawking is also stressed attempting to continue to work.  Virlan is on [employer] plan, while Jeani Hawking is using FMLA in order to keep her coverage.   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  She has invested her career in this field.  Spoke with chaplain about working with her children as being energizing in this time.    Family attends [employer] in the area and feels supported by this community.  Jeani Hawking joked with chaplain about "all of our pastors being from [place]."  She is supported at hospital by her mother and aunt.    Chaplains will continue to support this family as they navigate decisions around Lupus care.  Goal of meeting with Jeani Hawking and Jameon 1:1 and facilitating conversation through their decision making process.  Welcome consultation with palliative and nursing on how to best support this family.       Jerene Pitch [place]

## 2015-10-27 NOTE — Progress Notes (Signed)
ANTICOAGULATION CONSULT NOTE - Follow up [place] for Enoxaparin Indication: Hx Afib, PE, DVT  No Known Allergies  Patient Measurements: Height: 6\' 4"  (193 cm) Weight: 235 lb 0.2 oz (106.6 kg) IBW/kg (Calculated) : 86.8  Vital Signs: Temp: 97 F (36.1 C) (10/27 0358) Temp Source: Axillary (10/27 0358) BP: 92/55 mmHg (10/27 0358) Pulse Rate: 100 (10/27 0358)  Labs:  Recent Labs  10/25/15 0438  10/25/15 1000 10/26/15 0437 10/27/15 0458  HGB  --   < > 10.1* 10.7* 11.4*  HCT  --   --  31.2* 33.6* 36.2*  PLT  --   --  348 410* 379  CREATININE 1.37*  --   --  1.43* 1.45*  < > = values in this interval not displayed.  Estimated Creatinine Clearance: 72.6 mL/min (by C-G formula based on Cr of 1.45).   Medical History: Past Medical History  Diagnosis Date  . Diabetes mellitus without complication ([place])   . Hypertension   . 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 Hyperlipidemia   . Chronic diastolic congestive heart failure ([place]) 10/09/2015    Medications:  Scheduled:  . antiseptic oral rinse  7 mL Mouth Rinse BID  . enoxaparin (LOVENOX) injection  115 mg Subcutaneous Q12H  . levothyroxine  75 mcg Intravenous Daily   Infusions:  . dextrose 5 % and 0.9 % NaCl with KCl 40 mEq/L 75 mL/hr at 10/27/15 0551    Assessment: 53 yoM presented to ED on 10/18 from cancer center d/t hypotension and worsening of diarrhea. PMH significant for Atrial fibrillation, colon cancer currently undergoing chemotherapy treatment, and recent diagnosis of DVT and saddle PE (10/02/15) on Xarelto anticoagulation.  Xarelto was initially resumed on admission, but is now held for NPO status related to possible SBO.  Pharmacy is consulted to dose Lovenox.  Today, 10/27/2015: SCr increased to 1.45 with CrCl ~ 72 ml/min CBC: Hgb 11.4, Plt WNL S/p large volume paracentesis 10/26 with ~ 10 L removed.  No complications reported.  Goal of Therapy:   Anti-Xa level 0.6-1 units/ml 4hrs after LMWH dose given Monitor platelets by anticoagulation protocol: Yes   Plan:   Enoxaparin 115 mg SQ BID  Follow up renal function and CBC  Follow up long-term anticoagulation plans.  Gretta Arab PharmD, BCPS Pager [phone] 10/27/2015 8:16 AM

## 2015-10-28 ENCOUNTER — Encounter (HOSPITAL_COMMUNITY): Payer: Self-pay

## 2015-10-28 ENCOUNTER — Inpatient Hospital Stay (HOSPITAL_COMMUNITY): Payer: Managed Care, Other (non HMO)

## 2015-10-28 ENCOUNTER — Encounter (HOSPITAL_COMMUNITY)
Admission: EM | Disposition: A | Discharge status: Hospice | Payer: Self-pay | Source: Home / Self Care | Attending: Internal Medicine

## 2015-10-28 DIAGNOSIS — N179 Acute kidney failure, unspecified: Secondary | ICD-10-CM

## 2015-10-28 DIAGNOSIS — K56699 Other intestinal obstruction unspecified as to partial versus complete obstruction: Secondary | ICD-10-CM | POA: Diagnosis present

## 2015-10-28 DIAGNOSIS — F411 Generalized anxiety disorder: Secondary | ICD-10-CM

## 2015-10-28 DIAGNOSIS — F4329 Adjustment disorder with other symptoms: Secondary | ICD-10-CM | POA: Insufficient documentation

## 2015-10-28 HISTORY — PX: COLONIC STENT PLACEMENT: SHX5542

## 2015-10-28 HISTORY — PX: FLEXIBLE SIGMOIDOSCOPY: SHX5431

## 2015-10-28 SURGERY — SIGMOIDOSCOPY, FLEXIBLE
Anesthesia: Moderate Sedation

## 2015-10-28 MED ORDER — DIPHENHYDRAMINE HCL 50 MG/ML IJ SOLN
INTRAMUSCULAR | Status: DC | PRN
  Administered 2015-10-28: 25 mg via INTRAVENOUS

## 2015-10-28 MED ORDER — MIDAZOLAM HCL 5 MG/ML IJ SOLN
INTRAMUSCULAR | Status: AC
  Filled 2015-10-28: qty 2

## 2015-10-28 MED ORDER — SODIUM CHLORIDE 0.9 % IJ SOLN
INTRAMUSCULAR | Status: DC | PRN
  Administered 2015-10-28: 5 mL

## 2015-10-28 MED ORDER — DIPHENHYDRAMINE HCL 50 MG/ML IJ SOLN
INTRAMUSCULAR | Status: AC
  Filled 2015-10-28: qty 1

## 2015-10-28 MED ORDER — KCL IN DEXTROSE-NACL 20-5-0.9 MEQ/L-%-% IV SOLN
INTRAVENOUS | Status: DC
  Administered 2015-10-28 – 2015-11-01 (×9): via INTRAVENOUS
  Filled 2015-10-28 (×10): qty 1000

## 2015-10-28 MED ORDER — FENTANYL CITRATE (PF) 100 MCG/2ML IJ SOLN
INTRAMUSCULAR | Status: DC | PRN
  Administered 2015-10-28: 12.5 ug via INTRAVENOUS
  Administered 2015-10-28: 25 ug via INTRAVENOUS

## 2015-10-28 MED ORDER — MIDAZOLAM HCL 10 MG/2ML IJ SOLN
INTRAMUSCULAR | Status: DC | PRN
Start: 2015-10-28 — End: 2015-10-28
  Administered 2015-10-28: 1 mg via INTRAVENOUS
  Administered 2015-10-28 (×2): 2 mg via INTRAVENOUS

## 2015-10-28 MED ORDER — FENTANYL CITRATE (PF) 100 MCG/2ML IJ SOLN
INTRAMUSCULAR | Status: AC
  Filled 2015-10-28: qty 2

## 2015-10-28 SURGICAL SUPPLY — 1 items: WallFlex Colonic (Permanent Stent) ×2 IMPLANT

## 2015-10-28 NOTE — Brief Op Note (Signed)
10/18/2015 - 10/28/2015  3:23 PM  PATIENT:  Jaime [name]  59 y.o. male  PRE-OPERATIVE DIAGNOSIS:  colonic obstruction  POST-OPERATIVE DIAGNOSIS:  Sigmoid obstruction caused by mass  PROCEDURE:  Procedure(s): FLEXIBLE SIGMOIDOSCOPY (N/A) COLONIC STENT PLACEMENT (N/A)  SURGEON:  Surgeon(s) and Role:    * Gatha Mayer, MD - Primary   ANESTHESIA:   IV sedation  EBL:  0   Sigmoid cancer causing obstruction at about 35-40 cm. Submucosal omnipaque injected into NL distal mucosa Wire passed using biliary stone balloon and contrast also injected w/ fluoro guidance  90 x 22 mm colonic stent then successfully placed   Gatha Mayer, MD, [facility] [phone] (pager) 10/28/2015 3:28 PM

## 2015-10-28 NOTE — Progress Notes (Signed)
PROGRESS NOTE  Offie Waide YIR:[id number] DOB: 1956/06/22 DOA: 10/18/2015 PCP: Velna Hatchet, MD  HPI/Recap of past 56 hours: 59 year old male with past medical history of paroxysmal atrial fibrillation, colon cancer status post chemotherapy and pulmonary embolus admitted on 10/18 for hypovolemic shock that has been ongoing for the past few weeks secondary to continued diarrhea. Initial blood pressure with a systolic of 60, but lactic acid level normal. Patient noted to have elevated white blood cell count of 24 and a creatinine of 1.7. Started on IV fluids plus Zosyn and placed in stepdown unit. C. difficile cultures negative.  Transferred out of unit by 10/19.  Over the next few days, patient slowly continued to improve. Diarrhea eventually slowed down and then resolved. Patient started becoming hypokalemic and hypomagnesemic by 10/23 requiring replacement. Oncology met with patient and wife to discuss long-term outcomes and patient's CODE STATUS changed to DO NOT RESUSCITATE.    patient continued to have episodes of abdominal discomfort and persistent vomiting. X-ray checked noted early signs of small bowel obstruction. Abdominal distention worsened and he underwent US paracentesis and 9.8 lit of fluid was removed, after which his diarrhea resumed. Gi was reconsulted and he underwent a colon stent on 10/28.   Assessment/Plan: Principal Problem:   Hypovolemic shock (HCC) causing hypotension and secondary acute kidney injury: No evidence of sepsis given normal lactic acidosis. Hypotension secondary to diarrhea and continued Lasix use. IV fluids discontinued and Lasix initially started started. Lasix now on hold due to small bowel obstruction and trying to rehydrate. Replacing potassium accordingly with IV fluids. Renal function worsened to 1.45 again, because of dehydration, poor po intake.    Small bowel obstruction: Oncology ordered a CT abd, shows SBO from the colon mass.  Gi re  consulted by Dr Benay Spice.  Underwent sigmoidoscopy and a colon stent.  NG tube was placed.   Active Problems:   Malnutrition of moderate degree (HCC)   Colon cancer [facility]): Being followed by oncology, no plans for further chemotherapy unless functional status improves. Changed to DO NOT RESUSCITATE. Wife has asked palliative care consult for goals of care. ? Colon stent, further recommendations as per oncology and gastroenterology.  Possibly tomorrow.     PAF (paroxysmal atrial fibrillation) ([place]): Currently in normal sinus rhythm, already on anticoagulation.     Acute saddle pulmonary embolism with acute cor pulmonale [facility]): Have restarted xarelto    Chronic diastolic congestive heart failure [facility]): Patient now mildly volume overloaded, however given need for hydration for small bowel obstruction, have stopped Lasix and restarted gentle fluids with potassium, .     Anemia of chronic disease: Secondary to cancer. Continue iron     Diarrhea: Unclear etiology. No signs of infection, negative cultures and C. difficile negative. Suspect maybe enteritis? Leukocytosis now normal. Stopped antibiotics after first day. Much improved  Edema: We'll diurese once IV fluids stopped  Code Status: Now DO NOT RESUSCITATE  Family Communication: none at bedside.   Disposition Plan:  Pending.    Consultants:  Oncology  Gastroenterology  Procedures:  CT ABDOMEN and pelvis.   Antibiotics:  Zosyn 10/18-10/19  By mouth Flagyl 10/22-10/24   Objective: BP 121/77 mmHg  Pulse 101  Temp(Src) 98.1 F (36.7 C) (Oral)  Resp 18  Ht 6' 4"  (1.93 m)  Wt 103.4 kg (227 lb 15.3 oz)  BMI 27.76 kg/m2  SpO2 100%  Intake/Output Summary (Last 24 hours) at 10/28/15 1805 Last data filed at 10/28/15 1649  Gross per 24 hour  Intake  1240 ml  Output    375 ml  Net    865 ml   Filed Weights   10/26/15 0503 10/27/15 0358 10/28/15 0522  Weight: 117.6 kg (259 lb 4.2 oz) 106.6 kg (235 lb 0.2 oz) 103.4  kg (227 lb 15.3 oz)    Exam:   General:  Alert and oriented, not in any distress.   Cardiovascular: Regular rate and rhythm, S1-S2  Respiratory: Clear to auscultation bilaterally, diminished at bases.   Abdomen: Soft, nontender, moderate distention, good bowel sounds  Musculoskeletal: 3+ pitting edema from the knees down  Data Reviewed: Basic Metabolic Panel:  Recent Labs Lab 10/23/15 1030 10/23/15 1035 10/24/15 0344 10/25/15 0438 10/26/15 0437 10/27/15 0458  NA  --  138 138 139 140 141  K  --  2.6* 3.2* 3.5 3.9 4.3  CL  --  112* 111 113* 116* 114*  CO2  --  18* 18* 18* 17* 19*  GLUCOSE  --  122* 139* 170* 142* 170*  BUN  --  34* 35* 40* 42* 43*  CREATININE  --  0.97 1.02 1.37* 1.43* 1.45*  CALCIUM  --  7.9* 8.2* 8.3* 8.3* 8.2*  MG 1.6*  --   --   --  1.9  --    Liver Function Tests:  Recent Labs Lab 10/27/15 0458  AST 23  ALT 11*  ALKPHOS 364*  BILITOT 1.3*  PROT 5.6*  ALBUMIN 1.7*   No results for input(s): LIPASE, AMYLASE in the last 168 hours. No results for input(s): AMMONIA in the last 168 hours. CBC:  Recent Labs Lab 10/25/15 1000 10/26/15 0437 10/27/15 0458  WBC 9.4 9.9 12.1*  HGB 10.1* 10.7* 11.4*  HCT 31.2* 33.6* 36.2*  MCV 89.1 89.6 89.8  PLT 348 410* 379   Cardiac Enzymes:   No results for input(s): CKTOTAL, CKMB, CKMBINDEX, TROPONINI in the last 168 hours. BNP (last 3 results) No results for input(s): BNP in the last 8760 hours.  ProBNP (last 3 results) No results for input(s): PROBNP in the last 8760 hours.  CBG: No results for input(s): GLUCAP in the last 168 hours.  Recent Results (from the past 240 hour(s))  MRSA PCR Screening     Status: None   Collection Time: 10/18/15 11:12 PM  Result Value Ref Range Status   MRSA by PCR NEGATIVE NEGATIVE Final    Comment:        The GeneXpert MRSA Assay (FDA approved for NASAL specimens only), is one component of a comprehensive MRSA colonization surveillance program. It is  not intended to diagnose MRSA infection nor to guide or monitor treatment for MRSA infections.   Culture, blood (routine x 2)     Status: None   Collection Time: 10/18/15 11:30 PM  Result Value Ref Range Status   Specimen Description BLOOD RIGHT ARM  Final   Special Requests BOTTLES DRAWN AEROBIC AND ANAEROBIC 5ML  Final   Culture   Final    NO GROWTH 5 DAYS Performed at [facility]    Report Status 10/24/2015 FINAL  Final  Culture, blood (routine x 2)     Status: None   Collection Time: 10/19/15  3:55 AM  Result Value Ref Range Status   Specimen Description BLOOD LEFT ANTECUBITAL  Final   Special Requests BOTTLES DRAWN AEROBIC ONLY 5ML  Final   Culture   Final    NO GROWTH 5 DAYS Performed at [facility]    Report Status 10/24/2015 FINAL  Final     Studies: Dg Abd 1 View - Kub  10/28/2015  CLINICAL DATA:  Endoscopic placement of a colonic stent for colonic malignancy. EXAM: ABDOMEN - 1 VIEW COMPARISON:  Abdominal and pelvic CT scan of October 25, 2015 FINDINGS: Fluoro time is reported as 7 minutes, 36 seconds. Three fluoro spot images are reviewed. The colonic stent is seen to be deployed via the colonoscope. An area fixed narrowing process in the midportion of the stent due to the rigidity of the surrounding mass. There is no evidence of an immediate postprocedure complication. IMPRESSION: Successful deployment of a proximal sigmoid colon stent. There is persistent luminal narrowing however due to the circumferential mass. Electronically Signed   By: David  Martinique M.D.   On: 10/28/2015 16:56   Dg C-arm 1-60 Min-no Report  10/28/2015  CLINICAL DATA: Colonic Srtent OPlacement C-ARM 1-60 MINUTES Fluoroscopy was utilized by the requesting physician.  No radiographic interpretation.    Scheduled Meds: . antiseptic oral rinse  7 mL Mouth Rinse BID  . enoxaparin (LOVENOX) injection  115 mg Subcutaneous Q12H  . levothyroxine  75 mcg Intravenous Daily     Continuous Infusions: . dextrose 5 % and 0.9 % NaCl with KCl 20 mEq/L 100 mL/hr at 10/28/15 1711     Time spent: 15 minutes  [facility] Pager [phone]. If 7PM-7AM, please contact night-coverage at [email or web address], password [facility] 10/28/2015, 6:05 PM  LOS: 10 days

## 2015-10-28 NOTE — Progress Notes (Signed)
PT Cancellation Note  Patient Details Name: Jaime [name] MRN: [id number] DOB: July 30, 1956   Cancelled Treatment:    Reason Eval/Treat Not Completed: Patient at procedure or test/unavailable   Weston Anna, MPT Pager: [phone]

## 2015-10-28 NOTE — Progress Notes (Signed)
Discussion with patient and wife this morning. Plan for sigmoid stent today.  Please call Oncology as needed. I will see him 10/31/2015.

## 2015-10-28 NOTE — Op Note (Signed)
[facility] [place], [address]   FLEXIBLE SIGMOIDOSCOPY PROCEDURE REPORT  PATIENT: Jaime [name], Jaime [name]  MR#: [id number] BIRTHDATE: 02-11-1956 , 62  yrs. old GENDER: male ENDOSCOPIST: Gatha Mayer, MD, [facility] PROCEDURE DATE:  10/28/2015 PROCEDURE:   Sigmoidoscopy with stent  and submucosal injection ASA CLASS:   Class IV INDICATIONS:stent to palliate obstructing cancer. MEDICATIONS: Fentanyl 25 mcg IV, Benadryl 25 mg IV, and Versed 5 mg IV  DESCRIPTION OF PROCEDURE:   After the risks benefits and alternatives of the procedure were thoroughly explained, informed consent was obtained.  Digital exam revealed no abnormalities of the rectum. The     endoscope was introduced through the anus  and advanced to the    , The exam was Without limitations.    The quality of the prep was The overall prep quality was fair. . Estimated blood loss is zero unless otherwise noted in this procedure report. The instrument was then slowly withdrawn as the mucosa was fully examined.         COLON FINDINGS: Sigmoid cancer causing obstruction at about 35-40 cm. Submucosal omnipaque injected into NL distal mucosa Wire passed using biliary stone balloon and contrast also injected w/ fluoro guidance 90 x 22 mm colonic stent then successfully placed.    Retroflexion was not performed.    The scope was then withdrawn from the patient and the procedure terminated.  COMPLICATIONS: There were no immediate complications.  ENDOSCOPIC IMPRESSION: Sigmoid cancer causing obstruction at about 35-40 cm. Submucosal omnipaque injected into NL distal mucosa Wire passed using biliary stone balloon and contrast also injected w/ fluoro guidance 90 x 22 mm colonic stent then successfully placed  RECOMMENDATIONS: Sips of clears clamp NG leave flexi seal out for now - may need again KUB in AM   eSigned:  Gatha Mayer, MD, [facility] 10/28/2015 4:24 PM

## 2015-10-29 ENCOUNTER — Inpatient Hospital Stay (HOSPITAL_COMMUNITY): Payer: Managed Care, Other (non HMO)

## 2015-10-29 DIAGNOSIS — R180 Malignant ascites: Secondary | ICD-10-CM

## 2015-10-29 LAB — CBC
HCT: 34.7 % — ABNORMAL LOW (ref 39.0–52.0)
Hemoglobin: 11.4 g/dL — ABNORMAL LOW (ref 13.0–17.0)
MCH: 29 pg (ref 26.0–34.0)
MCHC: 32.9 g/dL (ref 30.0–36.0)
MCV: 88.3 fL (ref 78.0–100.0)
PLATELETS: 331 10*3/uL (ref 150–400)
RBC: 3.93 MIL/uL — AB (ref 4.22–5.81)
RDW: 18.1 % — AB (ref 11.5–15.5)
WBC: 16.2 10*3/uL — AB (ref 4.0–10.5)

## 2015-10-29 LAB — BASIC METABOLIC PANEL
Anion gap: 7 (ref 5–15)
BUN: 45 mg/dL — AB (ref 6–20)
CALCIUM: 7.8 mg/dL — AB (ref 8.9–10.3)
CO2: 16 mmol/L — ABNORMAL LOW (ref 22–32)
Chloride: 122 mmol/L — ABNORMAL HIGH (ref 101–111)
Creatinine, Ser: 1.41 mg/dL — ABNORMAL HIGH (ref 0.61–1.24)
GFR calc Af Amer: >60 mL/min (ref >60)
GFR, EST NON AFRICAN AMERICAN: 53 mL/min — AB (ref >60)
GLUCOSE: 113 mg/dL — AB (ref 65–99)
POTASSIUM: 4.3 mmol/L (ref 3.5–5.1)
SODIUM: 145 mmol/L (ref 135–145)

## 2015-10-29 LAB — AMMONIA: AMMONIA: 46 umol/L — AB (ref 9–35)

## 2015-10-29 MED ORDER — ENOXAPARIN SODIUM 120 MG/0.8ML ~~LOC~~ SOLN
105.0000 mg | Freq: Two times a day (BID) | SUBCUTANEOUS | Status: DC
  Administered 2015-10-29 – 2015-10-31 (×3): 105 mg via SUBCUTANEOUS
  Filled 2015-10-29 (×4): qty 0.8

## 2015-10-29 NOTE — Progress Notes (Signed)
          Daily Rounding Note  10/29/2015, 9:19 AM  LOS: 11 days   SUBJECTIVE:       Pt confused after flex sig yesterday.  Currently less confused but very drowsy.  Asked his wife why she had not been to visit in 3 days when she has been at bedside for many hours every day.  Did ask for and tolerated water this AM.  NGT clamped since flex sig   OBJECTIVE:         Vital signs in last 24 hours:    Temp:  [98 F (36.7 C)-98.6 F (37 C)] 98.2 F (36.8 C) (10/29 0433) Pulse Rate:  [92-101] 92 (10/29 0433) Resp:  [15-24] 16 (10/29 0433) BP: (95-125)/(63-84) 120/72 mmHg (10/29 0433) SpO2:  [96 %-100 %] 98 % (10/29 0433) Weight:  [235 lb 10.8 oz (106.9 kg)] 235 lb 10.8 oz (106.9 kg) (10/29 0433) Last BM Date: 10/28/15 Filed Weights   10/27/15 0358 10/28/15 0522 10/29/15 0433  Weight: 235 lb 0.2 oz (106.6 kg) 227 lb 15.3 oz (103.4 kg) 235 lb 10.8 oz (106.9 kg)   General: pale, cachectic, sleepy.    Heart: RRR Chest: clear in front.  No cough or labored breathing Abdomen: soft but prominent with obvous ascites.  BS hypoactive but no tinkling or tympanitic sounds.  Not tender  Neuro/Psych:  Oriented to year, self, but not to place or date.      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   *  Ascites, likely malignant, though ascites cytology from 10/5 with no malignant cells.  S/p 9.8 liter paracentesis 10/26.  Do not see pending cytology.   *  AMS, persistent since EGD/sedation but improving.     PLAN   *  Remove NGT, continue clears when alert enough for PO.  *  Prn repeat paracentesis.   *  Check ammonia level.  Was 67 in 07/2015.      Azucena Freed  10/29/2015, 9:19 AM Pager: [phone]  [facility] Attending  I have also seen and assessed the patient and agree with the advanced practitioner's assessment and plan.  He is better  overall but still quite ill and in decline KUB: - viewed The colon is decompressed and stent is opening. Small bowel loops still dilated - ? Floating in ascites or ileus or some obstruction Not vomiting Continue with clears for now Mild confusion but has been oriented  May need some encephalopathy Tx  I bet another paracentesis will be needed depending upon overall tx plan - hospice seems appropriate but i think there has been reluctance at least  Gatha Mayer, MD, [facility] [phone] (pager) 10/29/2015 1:04 PM

## 2015-10-29 NOTE — Progress Notes (Signed)
PROGRESS NOTE  Jaime [name] KZS:[id number] DOB: 06/20/1956 DOA: 10/18/2015 PCP: Velna Hatchet, MD  HPI/Recap of past 19 hours: 59 year old male with past medical history of paroxysmal atrial fibrillation, colon cancer status post chemotherapy and pulmonary embolus admitted on 10/18 for hypovolemic shock that has been ongoing for the past few weeks secondary to continued diarrhea. Initial blood pressure with a systolic of 60, but lactic acid level normal. Patient noted to have elevated white blood cell count of 24 and a creatinine of 1.7. Started on IV fluids plus Zosyn and placed in stepdown unit. C. difficile cultures negative.  Transferred out of unit by 10/19.  Over the next few days, patient slowly continued to improve. Diarrhea eventually slowed down and then resolved. Patient started becoming hypokalemic and hypomagnesemic by 10/23 requiring replacement. Oncology met with patient and wife to discuss long-term outcomes and patient's CODE STATUS changed to DO NOT RESUSCITATE.    patient continued to have episodes of abdominal discomfort and persistent vomiting. X-ray checked noted early signs of small bowel obstruction. Abdominal distention worsened and he underwent US paracentesis and 9.8 lit of fluid was removed, after which his diarrhea resumed. Gi was reconsulted and he underwent a colon stent on 10/28.   Assessment/Plan: Principal Problem:   Hypovolemic shock (HCC) causing hypotension and secondary acute kidney injury: No evidence of sepsis given normal lactic acidosis. Hypotension secondary to diarrhea and continued Lasix use. IV fluids discontinued and Lasix initially started started. Lasix now on hold due to small bowel obstruction and trying to rehydrate. Replacing potassium accordingly with IV fluids. Renal function worsened to 1.45 again, because of dehydration, poor po intake.    Small bowel obstruction: Oncology ordered a CT abd, shows SBO from the colon mass.  Gi re  consulted by Dr Benay Spice.  Underwent sigmoidoscopy and a colon stent.  NG tube was placed.   Active Problems:   Malnutrition of moderate degree (HCC)   Colon cancer [facility]): Being followed by oncology, no plans for further chemotherapy unless functional status improves. Changed to DO NOT RESUSCITATE. Wife has asked palliative care consult for goals of care. Underwent colon stent on 10/28,  He remained confused since yesterday.  Ordered ammonia level.  Worsening abdominal distention, might need paracentesis.     PAF (paroxysmal atrial fibrillation) ([place]): Currently in normal sinus rhythm, already on anticoagulation.     Acute saddle pulmonary embolism with acute cor pulmonale [facility]): Have restarted xarelto    Chronic diastolic congestive heart failure [facility]): Patient now mildly volume overloaded, however given need for hydration for small bowel obstruction, have stopped Lasix and restarted gentle fluids with potassium, .     Anemia of chronic disease: Secondary to cancer. Continue iron     Diarrhea: Unclear etiology. No signs of infection, negative cultures and C. difficile negative. Suspect maybe enteritis?  Stopped antibiotics.   Edema: We'll diurese once IV fluids stopped  Code Status: Now DO NOT RESUSCITATE  Family Communication: none at bedside.   Disposition Plan:  Pending.    Consultants:  Oncology  Gastroenterology  Procedures:  CT ABDOMEN and pelvis.   Antibiotics:  Zosyn 10/18-10/19  By mouth Flagyl 10/22-10/24   Objective: BP 120/72 mmHg  Pulse 92  Temp(Src) 98.2 F (36.8 C) (Oral)  Resp 16  Ht _0  (1.93 m)  Wt 106.9 kg (235 lb 10.8 oz)  BMI 28.70 kg/m2  SpO2 98%  Intake/Output Summary (Last 24 hours) at 10/29/15 1222 Last data filed at 10/29/15 0505  Gross per 24  hour  Intake    125 ml  Output    100 ml  Net     25 ml   Filed Weights   10/27/15 0358 10/28/15 0522 10/29/15 0433  Weight: 106.6 kg (235 lb 0.2 oz) 103.4 kg (227 lb 15.3 oz)  106.9 kg (235 lb 10.8 oz)    Exam:   General:  Confused, BUT not in any distress.   Cardiovascular: Regular rate and rhythm, S1-S2  Respiratory: Clear to auscultation bilaterally, diminished at bases. No wheezing or rhonchi.  Abdomen: Soft, nontender, moderate distention, good bowel sounds  Musculoskeletal: 3+ pitting edema from the knees down  Data Reviewed: Basic Metabolic Panel:  Recent Labs Lab 10/23/15 1030  10/24/15 0344 10/25/15 0438 10/26/15 0437 10/27/15 0458 10/29/15 0505  NA  --   < > 138 139 140 141 145  K  --   < > 3.2* 3.5 3.9 4.3 4.3  CL  --   < > 111 113* 116* 114* 122*  CO2  --   < > 18* 18* 17* 19* 16*  GLUCOSE  --   < > 139* 170* 142* 170* 113*  BUN  --   < > 35* 40* 42* 43* 45*  CREATININE  --   < > 1.02 1.37* 1.43* 1.45* 1.41*  CALCIUM  --   < > 8.2* 8.3* 8.3* 8.2* 7.8*  MG 1.6*  --   --   --  1.9  --   --   < > = values in this interval not displayed. Liver Function Tests:  Recent Labs Lab 10/27/15 0458  AST 23  ALT 11*  ALKPHOS 364*  BILITOT 1.3*  PROT 5.6*  ALBUMIN 1.7*   No results for input(s): LIPASE, AMYLASE in the last 168 hours. No results for input(s): AMMONIA in the last 168 hours. CBC:  Recent Labs Lab 10/25/15 1000 10/26/15 0437 10/27/15 0458 10/29/15 0505  WBC 9.4 9.9 12.1* 16.2*  HGB 10.1* 10.7* 11.4* 11.4*  HCT 31.2* 33.6* 36.2* 34.7*  MCV 89.1 89.6 89.8 88.3  PLT 348 410* 379 331   Cardiac Enzymes:   No results for input(s): CKTOTAL, CKMB, CKMBINDEX, TROPONINI in the last 168 hours. BNP (last 3 results) No results for input(s): BNP in the last 8760 hours.  ProBNP (last 3 results) No results for input(s): PROBNP in the last 8760 hours.  CBG: No results for input(s): GLUCAP in the last 168 hours.  No results found for this or any previous visit (from the past 240 hour(s)).   Studies: Dg Abd 1 View  10/29/2015  CLINICAL DATA:  Colonic obstruction due to colon cancer. Colon stent placement. EXAM:  ABDOMEN - 1 VIEW COMPARISON:  10/28/2015 and 10/27/2015 FINDINGS: There is persistent dilatation of multiple small bowel loops. Stent in the colon is seen in the left lower quadrant. The minimal luminal diameter in the stent appears improved since the images of 10/28/2015. NG tube tip in the fundus of the stomach. No appreciable dilated loops of the colon.  Air in the distal colon. IMPRESSION: Persistent dilated loops of small bowel, unchanged. No appreciable distention of the colon. Electronically Signed   By: Lorriane Shire M.D.   On: 10/29/2015 09:55   Dg Abd 1 View - Kub  10/28/2015  CLINICAL DATA:  Endoscopic placement of a colonic stent for colonic malignancy. EXAM: ABDOMEN - 1 VIEW COMPARISON:  Abdominal and pelvic CT scan of October 25, 2015 FINDINGS: Fluoro time is reported as 7 minutes, 36 seconds.  Three fluoro spot images are reviewed. The colonic stent is seen to be deployed via the colonoscope. An area fixed narrowing process in the midportion of the stent due to the rigidity of the surrounding mass. There is no evidence of an immediate postprocedure complication. IMPRESSION: Successful deployment of a proximal sigmoid colon stent. There is persistent luminal narrowing however due to the circumferential mass. Electronically Signed   By: David  Martinique M.D.   On: 10/28/2015 16:56   Dg C-arm 1-60 Min-no Report  10/28/2015  CLINICAL DATA: Colonic Srtent OPlacement C-ARM 1-60 MINUTES Fluoroscopy was utilized by the requesting physician.  No radiographic interpretation.    Scheduled Meds: . antiseptic oral rinse  7 mL Mouth Rinse BID  . enoxaparin (LOVENOX) injection  115 mg Subcutaneous Q12H  . levothyroxine  75 mcg Intravenous Daily    Continuous Infusions: . dextrose 5 % and 0.9 % NaCl with KCl 20 mEq/L 100 mL/hr at 10/29/15 0255     Time spent: 15 minutes  [place] [facility] Pager [phone]. If 7PM-7AM, please contact night-coverage at [email or web address], password  [facility] 10/29/2015, 12:22 PM  LOS: 11 days

## 2015-10-29 NOTE — Progress Notes (Signed)
ANTICOAGULATION CONSULT NOTE - Follow up [place] for Enoxaparin Indication: Hx Afib, PE, DVT  No Known Allergies  Patient Measurements: Height: 6\' 4"  (193 cm) Weight: 235 lb 10.8 oz (106.9 kg) IBW/kg (Calculated) : 86.8  Vital Signs:  Temp: 98.2 F (36.8 C) (10/29 0433) Temp Source: Oral (10/29 0433) BP: 120/72 mmHg (10/29 0433) Pulse Rate: 92 (10/29 0433)  Labs:  Recent Labs  10/27/15 0458 10/29/15 0505  HGB 11.4* 11.4*  HCT 36.2* 34.7*  PLT 379 331  CREATININE 1.45* 1.41*    Estimated Creatinine Clearance: 74.7 mL/min (by C-G formula based on Cr of 1.41).   Medical History: Past Medical History  Diagnosis Date  . Diabetes mellitus without complication ([place])   . Hypertension   . Metastatic carcinoma involving liver with unknown primary site [facility])   . Hyperlipidemia   . Chronic diastolic congestive heart failure ([place]) 10/09/2015    Medications:  Scheduled:  . antiseptic oral rinse  7 mL Mouth Rinse BID  . enoxaparin (LOVENOX) injection  115 mg Subcutaneous Q12H  . levothyroxine  75 mcg Intravenous Daily   Infusions:  . dextrose 5 % and 0.9 % NaCl with KCl 20 mEq/L 100 mL/hr at 10/29/15 1304    Assessment: 36 yoM presented to ED on 10/18 from cancer center d/t hypotension and worsening of diarrhea. PMH significant for Atrial fibrillation, colon cancer currently undergoing chemotherapy treatment, and recent diagnosis of DVT and saddle PE (10/02/15) on Xarelto anticoagulation.  Xarelto was initially resumed on admission, but is now held for NPO status related to possible SBO.  Pharmacy is consulted to dose Lovenox.  Today, 10/29/2015:  On Lovenox 115 mg SQ BID  SCr increased to 1.41 with CrCl ~ 74 ml/min  CBC: Hgb 11.4, Plt WNL  S/p flex sig and palliative stent placement 10/28, NGT clamped, no N/V, started on clears  S/p large volume paracentesis 10/26 with ~ 10 L removed - weight down to 106 kg  Goal of Therapy:  Anti-Xa level  0.6-1 units/ml 4hrs after LMWH dose given Monitor platelets by anticoagulation protocol: Yes   Plan:   Reduce Lovenox to 105 mg SQ BID  Follow up renal function and CBC  Follow up long-term anticoagulation plans  Hershal Coria, PharmD, BCPS Pager: [phone] 10/29/2015 1:46 PM

## 2015-10-30 DIAGNOSIS — E7220 Disorder of urea cycle metabolism, unspecified: Secondary | ICD-10-CM

## 2015-10-30 MED ORDER — SODIUM CHLORIDE 0.9 % IV BOLUS (SEPSIS)
500.0000 mL | Freq: Once | INTRAVENOUS | Status: AC
  Administered 2015-10-30: 500 mL via INTRAVENOUS

## 2015-10-30 NOTE — Progress Notes (Signed)
Patient 's vitals:99.9 (ax);HR 116;RR 24; 85/53; 98% Room Air. Patient oriented to self only. Patient responds to voice by nodding his head. Patient is not able to follow commands such as:- hand grips;sticking his tongue out. The patient was not able to tell RN his spouse's name.   PCP on call was notified. Awaiting any new orders.

## 2015-10-30 NOTE — Progress Notes (Signed)
Vitals status post NS 500 ml bolus:- 98.79F (o);HR 107;RR 22; 100/63;100% RA. PCP to be notified.

## 2015-10-30 NOTE — Progress Notes (Signed)
PROGRESS NOTE  Jaime [name] UKG:[id number] DOB: Oct 08, 1956 DOA: 10/18/2015 PCP: Velna Hatchet, MD  HPI/Recap of past 73 hours: 59 year old male with past medical history of paroxysmal atrial fibrillation, colon cancer status post chemotherapy and pulmonary embolus admitted on 10/18 for hypovolemic shock that has been ongoing for the past few weeks secondary to continued diarrhea. Initial blood pressure with a systolic of 60, but lactic acid level normal. Patient noted to have elevated white blood cell count of 24 and a creatinine of 1.7. Started on IV fluids plus Zosyn and placed in stepdown unit. C. difficile cultures negative.  Transferred out of unit by 10/19.  Over the next few days, patient slowly continued to improve. Diarrhea eventually slowed down and then resolved. Patient started becoming hypokalemic and hypomagnesemic by 10/23 requiring replacement. Oncology met with patient and wife to discuss long-term outcomes and patient's CODE STATUS changed to DO NOT RESUSCITATE.    patient continued to have episodes of abdominal discomfort and persistent vomiting. X-ray checked noted early signs of small bowel obstruction. Abdominal distention worsened and he underwent US paracentesis and 9.8 lit of fluid was removed, after which his diarrhea resumed. Gi was reconsulted and he underwent a colon stent on 10/28. Patient still confused, rifaximin.   Assessment/Plan: Principal Problem:   Hypovolemic shock (HCC) causing hypotension and secondary acute kidney injury: No evidence of sepsis given normal lactic acidosis. Hypotension secondary to diarrhea and continued Lasix use. Lasix discontinued and IV fluids started. Replacing potassium accordingly with IV fluids. Renal function worsened to 1.45 again, because of dehydration, poor po intake.    Small bowel obstruction: Oncology ordered a CT abd, shows SBO from the colon mass.  Gi re consulted by Dr Benay Spice.  Underwent sigmoidoscopy and a colon  stent.    Active Problems:   Malnutrition of moderate degree (HCC)   Colon cancer [facility]): Being followed by oncology, no plans for further chemotherapy unless functional status improves. Changed to DO NOT RESUSCITATE. Wife has asked palliative care consult for goals of care. Underwent colon stent on 10/28,  . Worsening abdominal distention, might need paracentesis tomorrow.   poor prognosis, recommend palliative care and hospice.       PAF (paroxysmal atrial fibrillation) ([place]): Currently in normal sinus rhythm, already on anticoagulation.     Acute saddle pulmonary embolism with acute cor pulmonale [facility]): Have restarted xarelto    Chronic diastolic congestive heart failure [facility]): Patient now mildly volume overloaded, however given need for hydration for small bowel obstruction, have stopped Lasix and restarted gentle fluids with potassium, .     Anemia of chronic disease: Secondary to cancer. Continue iron     Diarrhea: Unclear etiology. No signs of infection, negative cultures and C. difficile negative. Suspect maybe enteritis?  Stopped antibiotics.   Edema: We'll diurese once IV fluids stopped  Code Status: Now DO NOT RESUSCITATE  Family Communication: none at bedside.   Disposition Plan:  Pending.    Consultants:  Oncology  Gastroenterology  Procedures:  CT ABDOMEN and pelvis.   Antibiotics:  Zosyn 10/18-10/19  By mouth Flagyl 10/22-10/24   Objective: BP 120/98 mmHg  Pulse 122  Temp(Src) 98 F (36.7 C) (Axillary)  Resp 18  Ht 6' 4"  (1.93 m)  Wt 107.3 kg (236 lb 8.9 oz)  BMI 28.81 kg/m2  SpO2 100%  Intake/Output Summary (Last 24 hours) at 10/30/15 1613 Last data filed at 10/30/15 0830  Gross per 24 hour  Intake 926.67 ml  Output    303 ml  Net 623.67 ml   Filed Weights   10/28/15 0522 10/29/15 0433 10/30/15 0503  Weight: 103.4 kg (227 lb 15.3 oz) 106.9 kg (235 lb 10.8 oz) 107.3 kg (236 lb 8.9 oz)    Exam:   General:  Confused, BUT not in  any distress.   Cardiovascular: Regular rate and rhythm, S1-S2  Respiratory: Clear to auscultation bilaterally, diminished at bases. No wheezing or rhonchi.  Abdomen: Soft, nontender, moderate distention, good bowel sounds  Musculoskeletal: 3+ pitting edema from the knees down  Data Reviewed: Basic Metabolic Panel:  Recent Labs Lab 10/24/15 0344 10/25/15 0438 10/26/15 0437 10/27/15 0458 10/29/15 0505  NA 138 139 140 141 145  K 3.2* 3.5 3.9 4.3 4.3  CL 111 113* 116* 114* 122*  CO2 18* 18* 17* 19* 16*  GLUCOSE 139* 170* 142* 170* 113*  BUN 35* 40* 42* 43* 45*  CREATININE 1.02 1.37* 1.43* 1.45* 1.41*  CALCIUM 8.2* 8.3* 8.3* 8.2* 7.8*  MG  --   --  1.9  --   --    Liver Function Tests:  Recent Labs Lab 10/27/15 0458  AST 23  ALT 11*  ALKPHOS 364*  BILITOT 1.3*  PROT 5.6*  ALBUMIN 1.7*   No results for input(s): LIPASE, AMYLASE in the last 168 hours.  Recent Labs Lab 10/29/15 1220  AMMONIA 46*   CBC:  Recent Labs Lab 10/25/15 1000 10/26/15 0437 10/27/15 0458 10/29/15 0505  WBC 9.4 9.9 12.1* 16.2*  HGB 10.1* 10.7* 11.4* 11.4*  HCT 31.2* 33.6* 36.2* 34.7*  MCV 89.1 89.6 89.8 88.3  PLT 348 410* 379 331   Cardiac Enzymes:   No results for input(s): CKTOTAL, CKMB, CKMBINDEX, TROPONINI in the last 168 hours. BNP (last 3 results) No results for input(s): BNP in the last 8760 hours.  ProBNP (last 3 results) No results for input(s): PROBNP in the last 8760 hours.  CBG: No results for input(s): GLUCAP in the last 168 hours.  No results found for this or any previous visit (from the past 240 hour(s)).   Studies: No results found.  Scheduled Meds: . antiseptic oral rinse  7 mL Mouth Rinse BID  . enoxaparin (LOVENOX) injection  105 mg Subcutaneous Q12H  . levothyroxine  75 mcg Intravenous Daily    Continuous Infusions: . dextrose 5 % and 0.9 % NaCl with KCl 20 mEq/L 100 mL/hr at 10/30/15 0925     Time spent: 15 minutes  [place]  [facility] Pager [phone]. If 7PM-7AM, please contact night-coverage at [email or web address], password [facility] 10/30/2015, 4:13 PM  LOS: 12 days

## 2015-10-30 NOTE — Progress Notes (Signed)
Daily Rounding Note  10/30/2015, 8:08 AM  LOS: 12 days   SUBJECTIVE:       I/O net: +1.2 liters yesterday.  2+ stools charted yesterday.  RN says that they are moderate to smaller quantity and mucoid, improved c/w pre-stent.  Still incontinent of stool.   Urine output 400 ml. Weight (consistently bed scale) increased 9 # in 2 days, 1# in 24 hours .  No abd pain but the thighs hurt, a muscular deep hurt.  Appetite poor but tolerating clears.   OBJECTIVE:         Vital signs in last 24 hours:    Temp:  [97.9 F (36.6 C)-98.1 F (36.7 C)] 98.1 F (36.7 C) (10/30 0503) Pulse Rate:  [100-107] 100 (10/30 0503) Resp:  [18] 18 (10/30 0503) BP: (99-103)/(63-69) 99/67 mmHg (10/30 0503) SpO2:  [99 %-100 %] 100 % (10/30 0503) Weight:  [236 lb 8.9 oz (107.3 kg)] 236 lb 8.9 oz (107.3 kg) (10/30 0503) Last BM Date: 10/28/15 Filed Weights   10/28/15 0522 10/29/15 0433 10/30/15 0503  Weight: 227 lb 15.3 oz (103.4 kg) 235 lb 10.8 oz (106.9 kg) 236 lb 8.9 oz (107.3 kg)   General: very weak, obviously malnourished and edematous   Heart: RRR Chest: clear bil.  No cough or dyspnea Abdomen: protuberant but soft, NT.  BS present but hypoactive  Extremities: anasarca of legs.  Wasting musculature of UE. Derm: sacral and heal sores bandaged.  Linear pattern of petechial rash on thighs.  Neuro/Psych:  More alert and entirely appropriate today.    Intake/Output from previous day: 10/29 0701 - 10/30 0700 In: 1606.7 [P.O.:540; I.V.:1066.7] Out: 402 [Urine:400; Stool:2]  Intake/Output this shift:    Lab Results:  Recent Labs  10/29/15 0505  WBC 16.2*  HGB 11.4*  HCT 34.7*  PLT 331   BMET  Recent Labs  10/29/15 0505  NA 145  K 4.3  CL 122*  CO2 16*  GLUCOSE 113*  BUN 45*  CREATININE 1.41*  CALCIUM 7.8*    Studies/Results: Dg Abd 1 View  10/29/2015  CLINICAL DATA:  Colonic obstruction due to colon cancer. Colon stent  placement. EXAM: ABDOMEN - 1 VIEW COMPARISON:  10/28/2015 and 10/27/2015 FINDINGS: There is persistent dilatation of multiple small bowel loops. Stent in the colon is seen in the left lower quadrant. The minimal luminal diameter in the stent appears improved since the images of 10/28/2015. NG tube tip in the fundus of the stomach. No appreciable dilated loops of the colon.  Air in the distal colon. IMPRESSION: Persistent dilated loops of small bowel, unchanged. No appreciable distention of the colon. Electronically Signed   By: Lorriane Shire M.D.   On: 10/29/2015 09:55   Dg Abd 1 View - Kub  10/28/2015  CLINICAL DATA:  Endoscopic placement of a colonic stent for colonic malignancy. EXAM: ABDOMEN - 1 VIEW COMPARISON:  Abdominal and pelvic CT scan of October 25, 2015 FINDINGS: Fluoro time is reported as 7 minutes, 36 seconds. Three fluoro spot images are reviewed. The colonic stent is seen to be deployed via the colonoscope. An area fixed narrowing process in the midportion of the stent due to the rigidity of the surrounding mass. There is no evidence of an immediate postprocedure complication. IMPRESSION: Successful deployment of a proximal sigmoid colon stent. There is persistent luminal narrowing however due to the circumferential mass. Electronically Signed   By: David  Martinique M.D.   On: 10/28/2015  16:56   Dg C-arm 1-60 Min-no Report  10/28/2015  CLINICAL DATA: Colonic Srtent OPlacement C-ARM 1-60 MINUTES Fluoroscopy was utilized by the requesting physician.  No radiographic interpretation.   Scheduled Meds: . antiseptic oral rinse  7 mL Mouth Rinse BID  . enoxaparin (LOVENOX) injection  105 mg Subcutaneous Q12H  . levothyroxine  75 mcg Intravenous Daily   Continuous Infusions: . 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  Diarrhea improved but persists.  Stool studies 10/18- 10/19 all negative. NGT removed 10/29.   * Ascites, likely malignant, though ascites cytology from 10/5 with no malignant cells. S/p 9.8 liter paracentesis 10/26.    *  Protein malnutrition. Catabolic state.   * AMS, persistent since EGD/sedation but improving. Ammonia 46.   *  CKD, relatively stable labs.   *  Afib and now PE.  Lovenox in place, normally on Xarelto.    PLAN   *  May need repeat paracentesis prn for comfort.   *  Advance to soft diet (easier to chew in pt in weakened state).  Not worried about HH or carb mod parameters at this point.  May benefit from revisit from RD now that he can take po.  However concentrated nutritional supplements might exacerbate diarrhea.     Azucena Freed  10/30/2015, 8:08 AM Pager: [phone]  [facility] Attending  I have also seen and assessed the patient and agree with the advanced practitioner's assessment and plan. Will add Xifaxan given suspected encephalopathy and hyperammonemia. Would try to avoild lactulose with his situation - diarrhea, dilated loops small bowel. Seems like hospice next best step - he was asleep when I rounded - wife said she does not think he grasped 'all of that" whan i asked about palliative care discussion and hospice.  Am signing off but call us back prn  Gatha Mayer, MD, [facility] [phone] (pager) 10/30/2015 11:02 AM

## 2015-10-31 ENCOUNTER — Encounter (HOSPITAL_COMMUNITY): Payer: Self-pay | Admitting: Internal Medicine

## 2015-10-31 DIAGNOSIS — F411 Generalized anxiety disorder: Secondary | ICD-10-CM

## 2015-10-31 LAB — COMPREHENSIVE METABOLIC PANEL
ALK PHOS: 233 U/L — AB (ref 38–126)
ALT: 9 U/L — AB (ref 17–63)
ANION GAP: 6 (ref 5–15)
AST: 12 U/L — ABNORMAL LOW (ref 15–41)
Albumin: 1.5 g/dL — ABNORMAL LOW (ref 3.5–5.0)
BILIRUBIN TOTAL: 1 mg/dL (ref 0.3–1.2)
BUN: 44 mg/dL — ABNORMAL HIGH (ref 6–20)
CALCIUM: 7.2 mg/dL — AB (ref 8.9–10.3)
CO2: 15 mmol/L — ABNORMAL LOW (ref 22–32)
CREATININE: 1.42 mg/dL — AB (ref 0.61–1.24)
Chloride: 125 mmol/L — ABNORMAL HIGH (ref 101–111)
GFR calc non Af Amer: 52 mL/min — ABNORMAL LOW (ref >60)
GLUCOSE: 228 mg/dL — AB (ref 65–99)
Potassium: 2.8 mmol/L — ABNORMAL LOW (ref 3.5–5.1)
Sodium: 146 mmol/L — ABNORMAL HIGH (ref 135–145)
TOTAL PROTEIN: 4.7 g/dL — AB (ref 6.5–8.1)

## 2015-10-31 LAB — MAGNESIUM: Magnesium: 1.6 mg/dL — ABNORMAL LOW (ref 1.7–2.4)

## 2015-10-31 MED ORDER — POTASSIUM CHLORIDE 10 MEQ/100ML IV SOLN
10.0000 meq | INTRAVENOUS | Status: AC
  Administered 2015-10-31 (×3): 10 meq via INTRAVENOUS
  Filled 2015-10-31 (×3): qty 100

## 2015-10-31 NOTE — Progress Notes (Signed)
IP PROGRESS NOTE  Subjective:   He underwent placement of the sigmoid colon stent 10/20/2015. The nausea and vomiting resolved. He is eating a limited amount. The diarrhea has improved.  He is confused and agitated per his wife.  Objective: Vital signs in last 24 hours: Blood pressure 106/86, pulse 100, temperature 98.1 F (36.7 C), temperature source Oral, resp. rate 22, height 6\' 4"  (1.93 m), weight 248 lb 14.4 oz (112.9 kg), SpO2 100 %.  Intake/Output from previous day: 10/30 0701 - 10/31 0700 In: 2876.7 [P.O.:480; I.V.:2396.7] Out: 1 [Stool:1]  Physical Exam:  He is awake. Follows commands. He appears comfortable. He appears slightly agitated and confused.  Abdomen: Distended with ascites, liver edge palpable in the right upper abdomen, liquid stool on the bed sheet Extremities: Pitting edema throughout the left greater than right leg.    BMET  Recent Labs  10/29/15 0505 10/31/15 0333  NA 145 146*  K 4.3 2.8*  CL 122* 125*  CO2 16* 15*  GLUCOSE 113* 228*  BUN 45* 44*  CREATININE 1.41* 1.42*  CALCIUM 7.8* 7.2*    Studies/Results: No results found.  Medications: I have reviewed the patient's current medications.  Assessment/Plan: 1. 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, status post a colonoscopy 07/21/2015 confirming a sigmoid colon mass  Staging CT scans 07/13/2015 and 07/14/2015 confirmed extensive liver metastases, lung nodules, a right pleural effusion, and porta hepatis lymphadenopathy  Cycle 1 FOLFOX (chemotherapy doses reduced and bolus 5-FU eliminated) 08/02/2015  Cycle 2 FOLFOX (chemotherapy doses reduced and bolus 5-FU eliminated) 08/16/2015  Cycle 3 FOLFOX (Avastin added, bolus 5-FU introduced, and infusional 5-FU dose escalated) 08/30/2015  Cycle 4 FOLFOX plus Avastin 09/13/2015  Cycle 5 held on 09/27/2015 due to mild neutropenia, recent diarrhea.  CT of the chest 10/02/2015, compared to a CT from 07/13/2015-no significant change in  lung nodules or liver metastases  Diagnostic/therapeutic paracentesis 10/05/2015-negative cytology  CEA lower on 10/11/2015  CT 10/18/2015 with mild progression of liver metastases and lung lesions  2. Obstructive jaundice, likely secondary to porta hepatis lymphadenopathy, status post placement of a bile duct stent on 07/14/2015  3. Microcytic anemia secondary to the colon mass-improved  4. Diabetes  5. Failure to thrive secondary to metastatic colon cancer  6. Port-A-Cath placement 08/01/2015  7. Nausea following cycle 1 and cycle 2 FOLFOX-emend added with cycle 3  8. Diarrhea following cycle 4 FOLFOX/Avastin.  9. Neutropenia following cycle 4 FOLFOX/Avastin. Neulasta to be added with cycle 5.  10. Admission 10/02/2015 with bilateral DVTs and pulmonary emboli-maintained on Xarelto 10/07/2015  11. Diarrhea-C. difficile negative, etiology unclear  12. Hypotension-likely secondary to dehydration, improved  13.  Nausea/vomiting-plain x-ray suggestive of a small bowel obstruction  Status post placement of a sigmoid colon stent 10/28/2015  14.   CT 10/25/2015 with abnormal bowel distention partial mechanical obstruction favored secondary to sigmoid colon mass. Diffuse liver metastasis. Pulmonary nodules worrisome for metastatic disease. Large lytic lesion left iliac bone. Marked amount of ascites.  15.   Status post paracentesis 10/26/2015 with 9.8 L of fluid removed.  Jaime [name] has advanced metastatic colon cancer. He has a poor performance status. He is currently confused and now has renal insufficiency.  I discussed the situation with his wife. They are agreeable to a Hospice referral. He appears to be a candidate for [facility] place. The patient and his wife are in agreement. He will be transferred to [facility] place when a bed is available. We discussed the risk/benefit of continuing  anticoagulation therapy. The wife understands that he could develop  recurrent venous thromboembolic disease. We decided to discontinue anticoagulation.  I will follow him at [facility].   LOS: 13 days   Jaime [name]  10/31/2015, 9:23 AM

## 2015-10-31 NOTE — Progress Notes (Signed)
Nutrition Brief Note  Chart reviewed. Pt now comfort care only and to transfer to [facility] when a bed becomes available.  No further nutrition interventions warranted at this time.  Please re-consult as needed.      Jarome Matin, RD, LDN Inpatient Clinical Dietitian Pager # [phone] After hours/weekend pager # [phone]

## 2015-10-31 NOTE — Progress Notes (Signed)
[facility] bed confirmed for tomorrow- will advise MD and f/u tomorrow for disposition. Eduard Clos, MSW, [place]

## 2015-10-31 NOTE — Progress Notes (Signed)
PROGRESS NOTE  Jaime [name] KNL:[id number] DOB: [birth year] DOA: 10/18/2015 PCP: Velna Hatchet, MD  HPI/Recap of past 11 hours: 59 year old male with past medical history of paroxysmal atrial fibrillation, colon cancer status post chemotherapy and pulmonary embolus admitted on 10/18 for hypovolemic shock that has been ongoing for the past few weeks secondary to continued diarrhea. Initial blood pressure with a systolic of 60, but lactic acid level normal. Patient noted to have elevated white blood cell count of 24 and a creatinine of 1.7. Started on IV fluids plus Zosyn and placed in stepdown unit. C. difficile cultures negative.  Transferred out of unit by 10/19.  Over the next few days, patient slowly continued to improve. Diarrhea eventually slowed down and then resolved. Patient started becoming hypokalemic and hypomagnesemic by 10/23 requiring replacement. Oncology met with patient and wife to discuss long-term outcomes and patient's CODE STATUS changed to DO NOT RESUSCITATE.    patient continued to have episodes of abdominal discomfort and persistent vomiting. X-ray checked noted early signs of small bowel obstruction. Abdominal distention worsened and he underwent US paracentesis and 9.8 lit of fluid was removed, after which his diarrhea resumed. Gi was reconsulted and he underwent a colon stent on 10/28. Patient still confused, rifaximin was started, despite all treatment patient continues to decline, with poor functional status and worsening renal failure and confusion. Oncologist spoke with family and decision to transition to full comfort care was taken.    Assessment/Plan: Principal Problem:   Hypovolemic shock (HCC) causing hypotension and secondary acute kidney injury: No evidence of sepsis given normal lactic acidosis. Hypotension secondary to diarrhea and continued Lasix use. Lasix discontinued and IV fluids started. We are replacing potassium accordingly with IV fluids. Renal  function worsened to 1.45 again, because of dehydration, poor po intake and ongoing diarrhea. .    Small bowel obstruction: Oncology ordered a CT abd, shows SBO from the colon mass.  Gi re consulted by Dr Benay Spice.  Underwent sigmoidoscopy and a colon stent.    Active Problems:   Malnutrition of moderate degree (HCC)   Colon cancer [facility]): Being followed by oncology, no plans for further chemotherapy as he has poor functional status. he Underwent colon stent on 10/28,  .      PAF (paroxysmal atrial fibrillation) ([place]): Currently in normal sinus rhythm, .    Acute saddle pulmonary embolism with acute cor pulmonale ([place]): Have restarted xarelto, which was discontinued as we transitioned him to full comfort care.     Chronic diastolic congestive heart failure [facility]): Patient now mildly volume overloaded, however given need for hydration for small bowel obstruction, have stopped Lasix and restarted gentle fluids with potassium, . D/c fluids on discharge.     Anemia of chronic disease: Secondary to cancer.      Diarrhea: Unclear etiology. No signs of infection, negative cultures and C. difficile negative. Suspect maybe enteritis?  Stopped antibiotics. Initially a rectal tube was placed,but was removed later on.   Edema: We'll diurese once IV fluids stopped  Code Status: Now DO NOT RESUSCITATE  Family Communication: wife at bedside.   Disposition Plan:  [place] place when bed available.    Consultants:  Oncology  Gastroenterology  Procedures:  CT ABDOMEN and pelvis.   Antibiotics:  Zosyn 10/18-10/19  By mouth Flagyl 10/22-10/24   Objective: BP 106/86 mmHg  Pulse 100  Temp(Src) 98.1 F (36.7 C) (Oral)  Resp 22  Ht _0  (1.93 m)  Wt 112.9 kg (248 lb 14.4 oz)  BMI 30.31 kg/m2  SpO2 100%  Intake/Output Summary (Last 24 hours) at 10/31/15 1014 Last data filed at 10/31/15 1002  Gross per 24 hour  Intake 2836.67 ml  Output      0 ml  Net 2836.67 ml   Filed  Weights   10/29/15 0433 10/30/15 0503 10/31/15 0817  Weight: 106.9 kg (235 lb 10.8 oz) 107.3 kg (236 lb 8.9 oz) 112.9 kg (248 lb 14.4 oz)    Exam:   General:  Confused, BUT not in any distress.   Cardiovascular: Regular rate and rhythm, S1-S2  Respiratory: Clear to auscultation bilaterally, diminished at bases. No wheezing or rhonchi.  Abdomen: Soft, nontender, moderate distention, good bowel sounds  Musculoskeletal: 3+ pitting edema from the knees down  Data Reviewed: Basic Metabolic Panel:  Recent Labs Lab 10/25/15 0438 10/26/15 0437 10/27/15 0458 10/29/15 0505 10/31/15 0333  NA 139 140 141 145 146*  K 3.5 3.9 4.3 4.3 2.8*  CL 113* 116* 114* 122* 125*  CO2 18* 17* 19* 16* 15*  GLUCOSE 170* 142* 170* 113* 228*  BUN 40* 42* 43* 45* 44*  CREATININE 1.37* 1.43* 1.45* 1.41* 1.42*  CALCIUM 8.3* 8.3* 8.2* 7.8* 7.2*  MG  --  1.9  --   --  1.6*   Liver Function Tests:  Recent Labs Lab 10/27/15 0458 10/31/15 0333  AST 23 12*  ALT 11* 9*  ALKPHOS 364* 233*  BILITOT 1.3* 1.0  PROT 5.6* 4.7*  ALBUMIN 1.7* 1.5*   No results for input(s): LIPASE, AMYLASE in the last 168 hours.  Recent Labs Lab 10/29/15 1220  AMMONIA 46*   CBC:  Recent Labs Lab 10/25/15 1000 10/26/15 0437 10/27/15 0458 10/29/15 0505  WBC 9.4 9.9 12.1* 16.2*  HGB 10.1* 10.7* 11.4* 11.4*  HCT 31.2* 33.6* 36.2* 34.7*  MCV 89.1 89.6 89.8 88.3  PLT 348 410* 379 331   Cardiac Enzymes:   No results for input(s): CKTOTAL, CKMB, CKMBINDEX, TROPONINI in the last 168 hours. BNP (last 3 results) No results for input(s): BNP in the last 8760 hours.  ProBNP (last 3 results) No results for input(s): PROBNP in the last 8760 hours.  CBG: No results for input(s): GLUCAP in the last 168 hours.  No results found for this or any previous visit (from the past 240 hour(s)).   Studies: No results found.  Scheduled Meds: . antiseptic oral rinse  7 mL Mouth Rinse BID  . levothyroxine  75 mcg  Intravenous Daily  . potassium chloride  10 mEq Intravenous Q1 Hr x 3    Continuous Infusions: . dextrose 5 % and 0.9 % NaCl with KCl 20 mEq/L 100 mL/hr at 10/31/15 0423     Time spent: 15 minutes  [facility] Pager [phone]. If 7PM-7AM, please contact night-coverage at [email or web address], password [email or web address] 10/31/2015, 10:14 AM  LOS: 13 days

## 2015-10-31 NOTE — Progress Notes (Signed)
Daily Progress Note   Patient Name: Jaime [name]       Date: 10/31/2015 DOB: 1956-10-21  Age: 59 y.o. MRN#: [id number] Attending Physician: Hosie Poisson, MD Primary Care Physician: Velna Hatchet, MD Admit Date: 10/18/2015  Reason for Consultation/Follow-up: Establishing goals of care and Psychosocial/spiritual support, symptom management as indicated   Subjective:     -continued conversation with patient and his wife regarding current medical situation, treatment options, symptom management.  -discussed concept of full comfort path, offered emotional support empathtic listening, questions and concerns addressed  -decision to shift to full comfort and hopeful for hospice facility   Length of Stay: 13 days  Current Medications: Scheduled Meds:  . antiseptic oral rinse  7 mL Mouth Rinse BID  . enoxaparin (LOVENOX) injection  105 mg Subcutaneous Q12H  . levothyroxine  75 mcg Intravenous Daily    Continuous Infusions: . dextrose 5 % and 0.9 % NaCl with KCl 20 mEq/L 100 mL/hr at 10/31/15 0423    PRN Meds: LORazepam, morphine injection, ondansetron (ZOFRAN) IV, promethazine, sodium chloride  Palliative Performance Scale: 20 % at best     Vital Signs: BP 106/86 mmHg  Pulse 100  Temp(Src) 98.1 F (36.7 C) (Oral)  Resp 22  Ht 6\' 4"  (1.93 m)  Wt 107.3 kg (236 lb 8.9 oz)  BMI 28.81 kg/m2  SpO2 100% SpO2: SpO2: 100 % O2 Device: O2 Device: Not Delivered O2 Flow Rate: O2 Flow Rate (L/min): 2 L/min  Intake/output summary:   Intake/Output Summary (Last 24 hours) at 10/31/15 0752 Last data filed at 10/30/15 1843  Gross per 24 hour  Intake 1651.67 ml  Output      1 ml  Net 1650.67 ml   LBM: Last BM Date: 10/30/15 Baseline Weight: Weight: 109.2 kg (240 lb 11.9 oz) Most recent weight: Weight: 107.3 kg (236 lb 8.9 oz)  Physical Exam:           General: ill appearing, letahrgic HEENT: moist buccal membranes POE:[id number] Resp: decreased in bases Abd:  distended, firm Skin: warm and dry Neuro: lethargic, minimal conversation   Additional Data Reviewed: Recent Labs     10/29/15  0505  10/31/15  0333  WBC  16.2*   --   HGB  11.4*   --   PLT  331   --   NA  145  146*  BUN  45*  44*  CREATININE  1.41*  1.42*     Problem List:  Patient Active Problem List   Diagnosis Date Noted  . Hyperammonemia ([place]) 10/30/2015  . Anxiety state 10/28/2015  . Adjustment disorder with other symptom   . Stricture of colon ([place])   . Abdominal distention, non-gaseous   . Bowel obstruction ([place])   . Weakness generalized   . Palliative care encounter 10/25/2015  . Cancer of sigmoid colon ([place])   . Colon stricture ([place])   . SBO (small bowel obstruction) ([employer]) 10/24/2015  . Abnormal x-ray of abdomen   . Pressure ulcer 10/20/2015  . Hypovolemic shock ([place]) 10/19/2015  . Diarrhea 10/19/2015  . Hypothyroidism 10/19/2015  . Acute kidney injury ([place])   . AKI (acute kidney injury) ([place]) 10/18/2015  . Hypotension 10/18/2015  . Chronic diastolic congestive heart failure ([place]) 10/09/2015  . Anemia of chronic disease 10/09/2015  . Ascites   . PAF (paroxysmal atrial fibrillation) ([place]) 10/03/2015  . Acute saddle pulmonary embolism with acute cor pulmonale (HCC)   . Colon cancer ([place]) 07/26/2015  . Malnutrition of moderate degree (  [employer]) 07/18/2015     Palliative Care Assessment & Plan    Code Status:  DNR  Goals of Care:   Focus is now for comfort, quality and dignity   No further life prolonging measures  Hopeful for hospice facility    Symptom Management:   Anxiety: Ativan 1 Mg IV every 6 hrs prn/utilize prior to NG placement  Pain/Dyspnea: Morphine 2 mg every 3 hrs prn   Prognosis: < 2 weeks  5. Discharge Planning: Hospice facility, will write for choice  Care plan was discussed with Dr Learta Codding and Dr Karleen Hampshire  Thank you for allowing the Palliative Medicine Team to assist in the care of this patient.   Time In:  0730 Time  Out: 0805 Total Time 35 min Prolonged Time Billed  no    Greater than 50%  of this time was spent counseling and coordinating care related to the above assessment and plan.     Knox Royalty, NP  10/31/2015, 7:52 AM  Please contact Palliative Medicine Team phone at [phone] for questions and concerns.

## 2015-10-31 NOTE — Progress Notes (Signed)
CSW rec'd notice of residential hospice placement placement decision. CSW met with wife- patient laying in bed and unengaged. Confirmed their interest in [facility] and referral made to Erling Conte, MSW, [employer]-  Wife is familiar with [employer]- anxious to hear if this may be an option-  Will await further word and advise-  Eduard Clos, MSW, [place]

## 2015-10-31 NOTE — Progress Notes (Signed)
Physical Therapy Discharge Patient Details Name: Matei Magnone MRN: [id number] DOB: 1956-11-04 Today's Date: 10/31/2015 Time:  -     Patient discharged from PT services secondary to medical decline - Per MD order  Please see latest therapy progress note for current level of functioning and progress toward goals.     GP     Claretha Cooper 10/31/2015, 10:01 AM

## 2015-11-01 DIAGNOSIS — R531 Weakness: Secondary | ICD-10-CM

## 2015-11-01 MED ORDER — LORAZEPAM 2 MG/ML IJ SOLN: 1.0000 mg | Freq: Four times a day (QID) | INTRAMUSCULAR | Status: AC | PRN

## 2015-11-01 MED ORDER — MORPHINE SULFATE (PF) 2 MG/ML IV SOLN: 2.0000 mg | INTRAVENOUS | Status: AC | PRN

## 2015-11-01 NOTE — Care Management Note (Signed)
Case Management Note  Patient Details  Name: Jaime [name] MRN: [id number] Date of Birth: 1956/10/19  Subjective/Objective:  [place] following.CSW following.                  Action/Plan:d/c Residential Hospice facility.   Expected Discharge Date:   (UNKNOWN)               Expected Discharge Plan:  [place]  In-House Referral:  Clinical Social Work  Discharge planning Services  CM Consult  Post Acute Care Choice:  NA Choice offered to:  NA  DME Arranged:    DME Agency:     HH Arranged:    [place] Agency:     Status of Service:  Completed, signed off  Medicare Important Message Given:    Date Medicare IM Given:    Medicare IM give by:    Date Additional Medicare IM Given:    Additional Medicare Important Message give by:     If discussed at [place] of Stay Meetings, dates discussed:    Additional Comments:  Dessa Phi, RN 11/01/2015, 9:58 AM

## 2015-11-01 NOTE — Discharge Summary (Signed)
Physician Discharge Summary  Jaime [name] YQM:[id number] DOB: 30-Apr-1956 DOA: 10/18/2015  PCP: Velna Hatchet, MD  Admit date: 10/18/2015 Discharge date: 11/01/2015  Time spent: 15 minutes  Recommendations for Outpatient Follow-up:  Follow up with hospice MD as needed.  Discharge Diagnoses:  Principal Problem:   Hypovolemic shock ([place]) Active Problems:   Malnutrition of moderate degree (HCC)   Colon cancer (HCC)   PAF (paroxysmal atrial fibrillation) (HCC)   Acute saddle pulmonary embolism with acute cor pulmonale (HCC)   Chronic diastolic congestive heart failure (HCC)   Anemia of chronic disease   AKI (acute kidney injury) (HCC)   Hypotension   Diarrhea   Hypothyroidism   Acute kidney injury ([place])   Pressure ulcer   Abnormal x-ray of abdomen   SBO (small bowel obstruction) ([place])   Palliative care encounter   Cancer of sigmoid colon (HCC)   Colon stricture (HCC)   Bowel obstruction (HCC)   Weakness generalized   Abdominal distention, non-gaseous   Anxiety state   Adjustment disorder with other symptom   Stricture of colon (HCC)   Hyperammonemia ([place])    Diet recommendation: comfort feeds.   Filed Weights   10/30/15 0503 10/31/15 0817 11/01/15 0538  Weight: 107.3 kg (236 lb 8.9 oz) 112.9 kg (248 lb 14.4 oz) 114.9 kg (253 lb 4.9 oz)    History of present illness:  59 year old male with past medical history of paroxysmal atrial fibrillation, colon cancer status post chemotherapy and pulmonary embolus admitted on 10/18 for hypovolemic shock that has been ongoing for the past few weeks secondary to continued diarrhea. Initial blood pressure with a systolic of 60, but lactic acid level normal. Patient noted to have elevated white blood cell count of 24 and a creatinine of 1.7. Started on IV fluids plus Zosyn and placed in stepdown unit. C. difficile cultures negative. Transferred out of unit by 10/19.  Over the next few days, patient slowly continued to improve.  Diarrhea eventually slowed down and then resolved. Patient started becoming hypokalemic and hypomagnesemic by 10/23 requiring replacement. Oncology met with patient and wife to discuss long-term outcomes and patient's CODE STATUS changed to DO NOT RESUSCITATE.   patient continued to have episodes of abdominal discomfort and persistent vomiting. X-ray checked noted early signs of small bowel obstruction. Abdominal distention worsened and he underwent US paracentesis and 9.8 lit of fluid was removed, after which his diarrhea resumed. Gi was reconsulted and he underwent a colon stent on 10/28. Patient still confused, rifaximin was started, despite all treatment patient continues to decline, with poor functional status and worsening renal failure and confusion. Oncologist spoke with family and decision to transition to full comfort care was taken.   Hospital Course:  Hypovolemic shock ([place]) causing hypotension and secondary acute kidney injury: No evidence of sepsis given normal lactic acidosis. Hypotension secondary to diarrhea and continued Lasix use. Lasix discontinued and IV fluids started. We are replacing potassium accordingly with IV fluids. Renal function worsened to 1.45 again, because of dehydration, poor po intake and ongoing diarrhea. .    Small bowel obstruction: Oncology ordered a CT abd, shows SBO from the colon mass.  Gi re consulted by Dr Benay Spice.  Underwent sigmoidoscopy and a colon stent.    Active Problems:  Malnutrition of moderate degree (HCC)  Colon cancer [facility]): Being followed by oncology, no plans for further chemotherapy as he has poor functional status. he Underwent colon stent on 10/28, .     PAF (paroxysmal atrial fibrillation) ([place]): Currently  in normal sinus rhythm, .   Acute saddle pulmonary embolism with acute cor pulmonale ([place]): Have restarted xarelto, which was discontinued as we transitioned him to full comfort care.    Chronic diastolic congestive  heart failure [facility]): Patient now mildly volume overloaded, however given need for hydration for small bowel obstruction, have stopped Lasix and restarted gentle fluids with potassium, . D/c fluids on discharge.    Anemia of chronic disease: Secondary to cancer.     Diarrhea: Unclear etiology. No signs of infection, negative cultures and C. difficile negative.    Procedures:  Colon stent  Consultations:  Oncology  Gastroenterology  Palliative care    Discharge Exam: Filed Vitals:   11/01/15 0538  BP: 96/65  Pulse: 87  Temp: 97.2 F (36.2 C)  Resp: 20    General: confused but stable.  Cardiovascular: s1s2 Respiratory: diminished at bases.   Discharge Instructions    Current Discharge Medication List    CONTINUE these medications which have NOT CHANGED   Details  Amino Acids-Protein Hydrolys (FEEDING SUPPLEMENT, PRO-STAT SUGAR FREE 64,) LIQD Take 30 mLs by mouth 2 (two) times daily.    antiseptic oral rinse (CPC / CETYLPYRIDINIUM CHLORIDE 0.05%) 0.05 % LIQD solution 7 mLs by Mouth Rinse route 2 (two) times daily. Qty: 30 mL, Refills: 0    furosemide (LASIX) 40 MG tablet Take 1 tablet (40 mg total) by mouth 2 (two) times daily. Qty: 60 tablet, Refills: 0    levothyroxine (SYNTHROID, LEVOTHROID) 75 MCG tablet Take 75 mcg by mouth daily before breakfast.    Multiple Vitamins-Minerals (DECUBI-VITE PO) Take 1 capsule by mouth daily.    !! Nutritional Supplements (NUTRITIONAL SHAKE PO) Take 120 mLs by mouth 2 (two) times daily. Med pass    potassium chloride (KLOR-CON) 20 MEQ packet Take 20 mEq by mouth daily. Qty: 30 packet, Refills: 0   Associated Diagnoses: Colon cancer ([place])    !! Rivaroxaban (XARELTO) 15 MG TABS tablet Take 1 tablet (15 mg total) by mouth 2 (two) times daily with a meal. Qty: 60 tablet, Refills: 0    !! rivaroxaban (XARELTO) 20 MG TABS tablet Take 20 mg by mouth daily with supper.    diphenoxylate-atropine (LOMOTIL) 2.5-0.025 MG per  tablet Take 1 tablet by mouth 4 (four) times daily as needed for diarrhea or loose stools. Qty: 30 tablet, Refills: 0   Associated Diagnoses: Colon cancer ([place])    ergocalciferol (VITAMIN D2) 50000 UNITS capsule Take 50,000 Units by mouth 2 (two) times a week. Wed and Sat    !! feeding supplement, ENSURE ENLIVE, (ENSURE ENLIVE) LIQD Take 237 mLs by mouth 2 (two) times daily between meals. Qty: 237 mL, Refills: 12    lidocaine-prilocaine (EMLA) cream Apply small amount of cream over port area 1-2 hours prior to treatment and cover with plastic wrap.  DO NOT RUB IN Qty: 30 g, Refills: PRN    ondansetron (ZOFRAN) 8 MG tablet Take 1 tablet (8 mg total) by mouth every 8 (eight) hours as needed for nausea or vomiting. Qty: 20 tablet, Refills: 2    prochlorperazine (COMPAZINE) 10 MG tablet TAKE 1 TABLET (10 MG TOTAL) BY MOUTH EVERY 6 (SIX) HOURS AS NEEDED FOR NAUSEA OR VOMITING. Qty: 30 tablet, Refills: 1    traMADol (ULTRAM) 50 MG tablet Take 1 tablet (50 mg total) by mouth every 6 (six) hours as needed for moderate pain. Qty: 65 tablet, Refills: 0     !! - Potential duplicate medications found. Please discuss  with provider.     No Known Allergies    The results of significant diagnostics from this hospitalization (including imaging, microbiology, ancillary and laboratory) are listed below for reference.    Significant Diagnostic Studies: Ct Abdomen Pelvis Wo Contrast  10/25/2015  CLINICAL DATA:  Evaluate for small bowel obstruction. Elevated white blood cell count. Vomiting. EXAM: CT ABDOMEN AND PELVIS WITHOUT CONTRAST TECHNIQUE: Multidetector CT imaging of the abdomen and pelvis was performed following the standard protocol without IV contrast. COMPARISON:  10/18/2015 FINDINGS: Lower chest: There are small bilateral pleural effusions identified left greater than right. Overlying atelectasis noted. Pulmonary nodules are again noted compatible with metastatic disease. Hepatobiliary:  Extensive liver metastasis is identified throughout both lobes of the liver involving greater than 70% of the liver parenchyma. This appears similar to previous exam. Gallstones are again noted. There is a stent within the common bile duct. Pancreas: Atrophy of the pancreas. Spleen: Normal appearance of the spleen. Adrenals/Urinary Tract: The left adrenal gland is normal. Similar appearance of right adrenal nodule measuring 2.1 cm, image 36 of series 2. Kidneys are unremarkable. Urinary bladder appears normal. Stomach/Bowel: Normal appearance of the stomach. Abnormal small and large bowel dilatation is again noted. Numerous small bowel fluid levels are identified. Colonic distention extends to the level of the sigmoid colon were there is a solid-appearing mass measuring approximately 4.7 cm, image 78/series 2. Enteric contrast material is identified within the rectum, likely from CT dated 10/18/2015. The rectum has a normal caliber. There is a rectal tube in place. Vascular/Lymphatic: Calcified atherosclerotic disease involves the abdominal aorta. No aneurysm. Pre aortic lymph node within the retroperitoneum measures 9 mm, image 48 of series 2. Similar to previous exam. No significant pelvic or inguinal adenopathy. Reproductive: Prostate gland and seminal vesicles difficult to visualize. Other: There is a marked amount of ascites within the abdomen and pelvis. Musculoskeletal: Lytic bone metastases involving the left iliac bone is again noted. This extends to the roof of the acetabulum and measures 5.6 cm, image 94 of series 2. Previously 5.6 cm. This may be of orthopedic significance. IMPRESSION: 1. Abnormal bowel distension. Favor partial mechanical obstruction secondary to sigmoid colon mass. 2. Diffuse liver metastasis. 3. Pulmonary nodules worrisome for metastatic disease 4. Large lytic lesion within the left iliac bone is identified. This may be of orthopedic significance. 5. Marked amount of abdominal  ascites. This may reflect liver dysfunction secondary to extensive metastatic disease and/or peritoneal metastasis. 6. These results will be called to the ordering clinician or representative by the Radiologist Assistant, and communication documented in the PACS or zVision Dashboard. Electronically Signed   By: Kerby Moors M.D.   On: 10/25/2015 15:30   Ct Abdomen Pelvis Wo Contrast  10/18/2015  CLINICAL DATA:  Acute onset of nausea, vomiting and diarrhea. Recently diagnosed pulmonary embolus. Status post chemotherapy for colorectal adenocarcinoma. Initial encounter. EXAM: CT ABDOMEN AND PELVIS WITHOUT CONTRAST TECHNIQUE: Multidetector CT imaging of the abdomen and pelvis was performed following the standard protocol without IV contrast. COMPARISON:  CT of the abdomen and pelvis performed 07/13/2015 FINDINGS: Scattered nodules of varying size are noted at the lung bases, compatible with metastatic disease. Trace bilateral pleural effusions are seen, right greater than left. Extensive masses are again noted throughout the liver, measuring up to 14.6 cm in size. These appear mildly worsened from the prior study. Minimally decreased attenuation at the inferior aspect of the spleen may also reflect metastatic disease. Cholelithiasis is noted; vicarious contrast excretion is seen in  the gallbladder. The gallbladder is not well assessed due to surrounding ascites. Moderate to large volume ascites is noted throughout the abdomen and pelvis. This is increased from the prior study. There is increasing diffuse omental edema, with slight nodularity, raising question for mild omental caking. The pancreas is unremarkable in appearance. A 1.9 cm nodule is noted at the right adrenal gland. The left adrenal gland is unremarkable in appearance. The kidneys are unremarkable in appearance. There is no evidence of hydronephrosis. No renal or ureteral stones are seen. Nonspecific perinephric stranding is noted bilaterally. There  is distention of the cecum to 10.5 cm; the colon is largely filled with fluid. There is mildly irregular wall thickening along the descending colon, and a persistent 5.2 x 3.3 cm mass is noted at the mid sigmoid colon. Findings likely reflect some degree of dysmotility. The small bowel is unremarkable in appearance. The stomach is within normal limits. No acute vascular abnormalities are seen. Mild scattered calcification is noted along the abdominal aorta and its branches. The bladder is mildly distended and grossly unremarkable. The prostate remains normal in size. No inguinal lymphadenopathy is seen. Diffuse soft tissue edema about the abdomen and pelvis likely reflects mild anasarca. No acute osseous abnormalities are identified. IMPRESSION: 1. Distention of the cecum to 10.5 cm. The colon is largely filled with fluid. Mildly irregular wall thickening along the descending colon, and persistent 5.2 x 3.3 cm mass at the mid sigmoid colon. Bowel distention likely reflects some degree of dysmotility. 2. Worsening moderate to large volume ascites within the abdomen and pelvis. Increasing diffuse omental edema, with slight nodularity, raising question for mild omental caking. 3. Extensive masses throughout the liver, measuring up to 14.6 cm in size, appear mildly worsened from the prior study. Minimally decreased attenuation at the inferior aspect of the spleen may also reflect metastatic disease. 4. 1.9 cm right adrenal nodule noted; this has decreased mildly in size. 5. Scattered nodules of varying size at the lung bases, more prominent than on the prior study and compatible with metastatic disease. Trace bilateral pleural effusions, right greater than left. 6. Cholelithiasis; gallbladder otherwise grossly unremarkable. 7. Mild scattered calcification along the abdominal aorta and its branches. 8. Diffuse soft tissue edema about the abdomen and pelvis likely reflects mild anasarca. Electronically Signed   By:  Garald Balding M.D.   On: 10/18/2015 22:50   Dg Abd 1 View  10/29/2015  CLINICAL DATA:  Colonic obstruction due to colon cancer. Colon stent placement. EXAM: ABDOMEN - 1 VIEW COMPARISON:  10/28/2015 and 10/27/2015 FINDINGS: There is persistent dilatation of multiple small bowel loops. Stent in the colon is seen in the left lower quadrant. The minimal luminal diameter in the stent appears improved since the images of 10/28/2015. NG tube tip in the fundus of the stomach. No appreciable dilated loops of the colon.  Air in the distal colon. IMPRESSION: Persistent dilated loops of small bowel, unchanged. No appreciable distention of the colon. Electronically Signed   By: Lorriane Shire M.D.   On: 10/29/2015 09:55   Dg Abd 1 View - Kub  10/28/2015  CLINICAL DATA:  Endoscopic placement of a colonic stent for colonic malignancy. EXAM: ABDOMEN - 1 VIEW COMPARISON:  Abdominal and pelvic CT scan of October 25, 2015 FINDINGS: Fluoro time is reported as 7 minutes, 36 seconds. Three fluoro spot images are reviewed. The colonic stent is seen to be deployed via the colonoscope. An area fixed narrowing process in the midportion of the stent  due to the rigidity of the surrounding mass. There is no evidence of an immediate postprocedure complication. IMPRESSION: Successful deployment of a proximal sigmoid colon stent. There is persistent luminal narrowing however due to the circumferential mass. Electronically Signed   By: David  Martinique M.D.   On: 10/28/2015 16:56   Ct Angio Chest Pe W/cm &/or Wo Cm  10/02/2015  CLINICAL DATA:  Weakness. Hypotension. 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 Has htn, dm. EXAM: CT ANGIOGRAPHY CHEST WITH CONTRAST TECHNIQUE: Multidetector CT imaging of the chest was performed using the standard protocol during bolus administration of intravenous contrast. Multiplanar CT image reconstructions and MIPs were obtained to evaluate the vascular  anatomy. CONTRAST:  184m OMNIPAQUE IOHEXOL 350 MG/ML SOLN COMPARISON:  07/14/2015 FINDINGS: Contrast injection via right IJ power port. RV/LV ratio 1.05, borderline increased. However, the RV is less dilated than seen on prior study. There is a saddle embolus at the bifurcation of the main pulmonary artery with central emboli in the right upper lobe pulmonary artery and left lower lobe pulmonary artery branches, and more peripheral segmental emboli in lingular, left upper lobe, and right lower lobe branches. Moderate right and tiny left pleural effusions. Progressive sub carinal adenopathy. Subcentimeter precarinal node and small anterior mediastinal nodes. No definite hilar adenopathy. 12 mm lingular pulmonary nodule. Multiple left lower lobe pulmonary nodules, largest in the lateral basal segment measured and 16 mm, increased from previous exam. Dependent atelectasis in the right lower lobe, with several pulmonary nodules still conspicuous. Multiple small pulmonary nodules in both upper lobes largely stable. Thoracic spine and sternum intact. Multiple large hepatic metastases. There is a large amount of abdominal ascites, significantly increased from previous exam. Review of the MIP images confirms the above findings. IMPRESSION: 1. New central and segmental bilateral pulmonary emboli with equivocal suggestion of right heart strain due to submassive (intermediate risk) PE. 2. Large amount of abdominal ascites, significantly increased from prior study. 3. Progression of pulmonary and mediastinal metastatic disease. Critical Value/emergent results were called by telephone at the time of interpretation on 10/02/2015 at 12:37 pm to Dr. DDeno Etienne, who verbally acknowledged these results. Electronically Signed   By: DLucrezia EuropeM.D.   On: 10/02/2015 12:38   [place]acentesis  10/26/2015  INDICATION: Stage IV colon cancer, recurrent ascites. Request is made for large volume therapeutic paracentesis up to 10 liters.  EXAM: ULTRASOUND-GUIDED THERAPEUTIC PARACENTESIS COMPARISON:  Prior paracentesis on 10/05/2015 MEDICATIONS: None. COMPLICATIONS: None immediate TECHNIQUE: Informed written consent was obtained from the patient after a discussion of the risks, benefits and alternatives to treatment. A timeout was performed prior to the initiation of the procedure. Initial ultrasound scanning demonstrates a large amount of ascites within the left lower abdominal quadrant. The left lower abdomen was prepped and draped in the usual sterile fashion. 1% lidocaine was used for local anesthesia. Under direct ultrasound guidance, a 19 gauge, 10-cm, Yueh catheter was introduced. An ultrasound image was saved for documentation purposed. The paracentesis was performed. The catheter was removed and a dressing was applied. The patient tolerated the procedure well without immediate post procedural complication. FINDINGS: A total of approximately 9.8 liters of yellow fluid was removed. IMPRESSION: Successful ultrasound-guided therapeutic paracentesis yielding 9.8 liters of peritoneal fluid. Read by: KRowe Robert PA-C Electronically Signed   By: GAletta EdouardM.D.   On: 10/26/2015 15:16   [place]acentesis  10/05/2015  INDICATION: Metastatic colon carcinoma, recent PE/DVT, ascites. Request is made for  diagnostic and therapeutic paracentesis up to 6 liters. EXAM: ULTRASOUND-GUIDED DIAGNOSTIC AND THERAPEUTIC PARACENTESIS COMPARISON:  None. MEDICATIONS: None. COMPLICATIONS: None immediate TECHNIQUE: Informed written consent was obtained from the patient after a discussion of the risks, benefits and alternatives to treatment. A timeout was performed prior to the initiation of the procedure. Initial ultrasound scanning demonstrates a large amount of ascites within the left lower abdominal quadrant. The left lower abdomen was prepped and draped in the usual sterile fashion. 1% lidocaine was used for local anesthesia. Under direct ultrasound guidance,  a 19 gauge, 10-cm, Yueh catheter was introduced. An ultrasound image was saved for documentation purposed. The paracentesis was performed. The catheter was removed and a dressing was applied. The patient tolerated the procedure well without immediate post procedural complication. FINDINGS: A total of approximately 6 liters of yellow fluid was removed. Samples were sent to the laboratory as requested by the clinical team. IMPRESSION: Successful ultrasound-guided diagnostic and therapeutic paracentesis yielding 6 liters of peritoneal fluid. Read by: Rowe Robert, PA-C Electronically Signed   By: Jerilynn Mages.  Shick M.D.   On: 10/05/2015 16:24   Dg Chest Port 1 View  10/02/2015  CLINICAL DATA:  Weakness.  Metastatic cancer. EXAM: PORTABLE CHEST 1 VIEW COMPARISON:  Chest CT 07/14/2015 FINDINGS: Port-A-Cath tip in the SVC. Hypoventilation with decreased lung volume. Right lower lobe atelectasis. Mild left lower lobe atelectasis also noted. Multiple lung nodules are noted on the prior CT. These are small and not well seen on the chest x-ray. IMPRESSION: Hypoventilation with bibasilar atelectasis. Multiple lung nodules noted from prior chest CT. Electronically Signed   By: Franchot Gallo M.D.   On: 10/02/2015 11:47   Dg Abd 2 Views  10/27/2015  CLINICAL DATA:  Abdominal pain, known history of hepatic metastatic disease EXAM: ABDOMEN - 2 VIEW COMPARISON:  10/25/2015 FINDINGS: Scattered large and small bowel gas is noted. Small bowel dilatation consistent with obstruction is noted. This is similar to that seen on recent CT examination. Generalized increased density in the abdomen is noted related to a mild degree of ascites. No free air is seen. A biliary stent is noted. IMPRESSION: Changes consistent with bowel obstruction stable from the prior exam. Electronically Signed   By: Inez Catalina M.D.   On: 10/27/2015 10:40   Dg Abd 2 Views  10/21/2015  CLINICAL DATA:  Abdominal distension, non gaseous, hypertension, diabetes  mellitus, metastatic carcinoma of unknown primary EXAM: ABDOMEN - 2 VIEW COMPARISON:  CT abdomen and pelvis 10/18/2015 FINDINGS: Food debris and gas within stomach. Stool and gas throughout nondistended colon. CBD stent. Probable calcified gallstones projecting over RIGHT twelfth rib. Air-filled upper normal caliber small bowel loops mid abdomen. No bowel wall thickening or free intraperitoneal air. Degenerative disc disease changes thoracolumbar spine. Osseous demineralization. No urinary tract calcifications. IMPRESSION: CBD stent. Probable calcified gallstones. Nonobstructive bowel gas pattern. Electronically Signed   By: Lavonia Dana M.D.   On: 10/21/2015 17:56   Dg Abd Portable 1v  10/25/2015  CLINICAL DATA:  Nausea, small-bowel obstruction EXAM: PORTABLE ABDOMEN - 1 VIEW COMPARISON:  Supine abdominal film of October 24, 2015 FINDINGS: There remain loops of mildly distended gas-filled small bowel throughout the mid and lower abdomen. There is a small amount of gas within the stomach. There is a stent present within the common bile duct. No free extraluminal gas collections are observed. There is no evidence of colonic dilation. IMPRESSION: Persistent distal small bowel obstructive pattern. Nasogastric suction may be useful. Electronically Signed   By:  David  Martinique M.D.   On: 10/25/2015 07:24   Dg Abd Portable 1v  10/24/2015  CLINICAL DATA:  Nausea and vomiting.  Abdominal distention. EXAM: PORTABLE ABDOMEN - 1 VIEW COMPARISON:  Single view of the abdomen 10/21/2015. FINDINGS: Since the prior examination, there has been marked increase in diffuse gaseous distention of small bowel. No gas is visualized in the colon. Common bile duct stent is noted. IMPRESSION: Marked increase and diffuse gaseous distention of small bowel worrisome for small bowel obstruction. Electronically Signed   By: Inge Rise M.D.   On: 10/24/2015 10:11   Dg C-arm 1-60 Min-no Report  10/28/2015  CLINICAL DATA: Colonic  Srtent OPlacement C-ARM 1-60 MINUTES Fluoroscopy was utilized by the requesting physician.  No radiographic interpretation.    Microbiology: No results found for this or any previous visit (from the past 240 hour(s)).   Labs: Basic Metabolic Panel:  Recent Labs Lab 10/26/15 0437 10/27/15 0458 10/29/15 0505 10/31/15 0333  NA 140 141 145 146*  K 3.9 4.3 4.3 2.8*  CL 116* 114* 122* 125*  CO2 17* 19* 16* 15*  GLUCOSE 142* 170* 113* 228*  BUN 42* 43* 45* 44*  CREATININE 1.43* 1.45* 1.41* 1.42*  CALCIUM 8.3* 8.2* 7.8* 7.2*  MG 1.9  --   --  1.6*   Liver Function Tests:  Recent Labs Lab 10/27/15 0458 10/31/15 0333  AST 23 12*  ALT 11* 9*  ALKPHOS 364* 233*  BILITOT 1.3* 1.0  PROT 5.6* 4.7*  ALBUMIN 1.7* 1.5*   No results for input(s): LIPASE, AMYLASE in the last 168 hours.  Recent Labs Lab 10/29/15 1220  AMMONIA 46*   CBC:  Recent Labs Lab 10/25/15 1000 10/26/15 0437 10/27/15 0458 10/29/15 0505  WBC 9.4 9.9 12.1* 16.2*  HGB 10.1* 10.7* 11.4* 11.4*  HCT 31.2* 33.6* 36.2* 34.7*  MCV 89.1 89.6 89.8 88.3  PLT 348 410* 379 331   Cardiac Enzymes: No results for input(s): CKTOTAL, CKMB, CKMBINDEX, TROPONINI in the last 168 hours. BNP: BNP (last 3 results) No results for input(s): BNP in the last 8760 hours.  ProBNP (last 3 results) No results for input(s): PROBNP in the last 8760 hours.  CBG: No results for input(s): GLUCAP in the last 168 hours.     SignedHosie Poisson  [facility] 11/01/2015, 9:49 AM

## 2015-11-01 NOTE — Progress Notes (Signed)
Patient's wife at bedside- "ready for his last move". She reports family and friend support and is prepared for this transition.  EMS to transport. Eduard Clos, MSW, [place]

## 2015-11-01 NOTE — Progress Notes (Signed)
Report called to [facility] at [facility]. Hospital course and plan of care reviewed. All questions answered. Callie Fielding RN

## 2015-11-01 NOTE — Consult Note (Signed)
[facility] room is available for Jaime [name] this morning. Spouse completed paper work yesterday for transfer today. RNCM and CSW aware. Spouse is pleased Dr. Benay Spice will assume care at [facility].   RN please leave chest port accessible for use at [facility].   RN Please call report to [phone].  Please fax discharge summary to [phone].  Please arrange transport for patient to arrive before noon if possible.   Thank you.  Jaime [name], [name]

## 2015-11-03 ENCOUNTER — Telehealth: Payer: Self-pay

## 2015-11-04 ENCOUNTER — Telehealth: Payer: Self-pay | Admitting: Oncology

## 2015-11-04 NOTE — Telephone Encounter (Signed)
Received death certificate 11/04/15 °

## 2015-12-01 NOTE — Telephone Encounter (Signed)
Message from [facility]. Pt was admitted there on 11/09/23. Pt passed away on 2023/11/11 at 0832 am. Called HIM.

## 2015-12-01 DEATH — deceased

## 2015-12-28 ENCOUNTER — Other Ambulatory Visit: Payer: Self-pay | Admitting: Nurse Practitioner

## 2016-08-29 IMAGING — US US PARACENTESIS
1 series · 6 of 6 positions shown · non-contrast
Comparison: None.

MEDICATIONS:
None.

COMPLICATIONS:
None immediate

INDICATION: Metastatic colon carcinoma, recent PE/DVT, ascites. Request is made
for diagnostic and therapeutic paracentesis up to 6 liters.

EXAM:
ULTRASOUND-GUIDED DIAGNOSTIC AND THERAPEUTIC PARACENTESIS
TECHNIQUE: Informed written consent was obtained from the patient after a
discussion of the risks, benefits and alternatives to treatment. A
timeout was performed prior to the initiation of the procedure.

[Series 1: us paracentesis · 0.27mm/px · 6 of 6 slices shown]
[im 1/6]
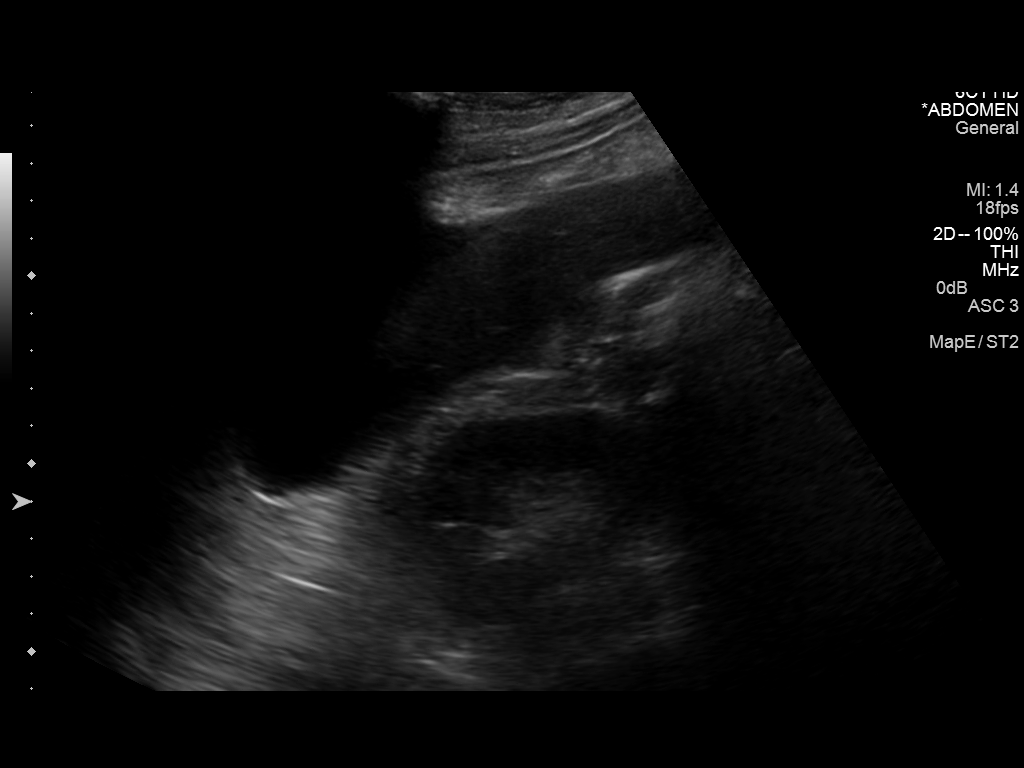
[im 2/6]
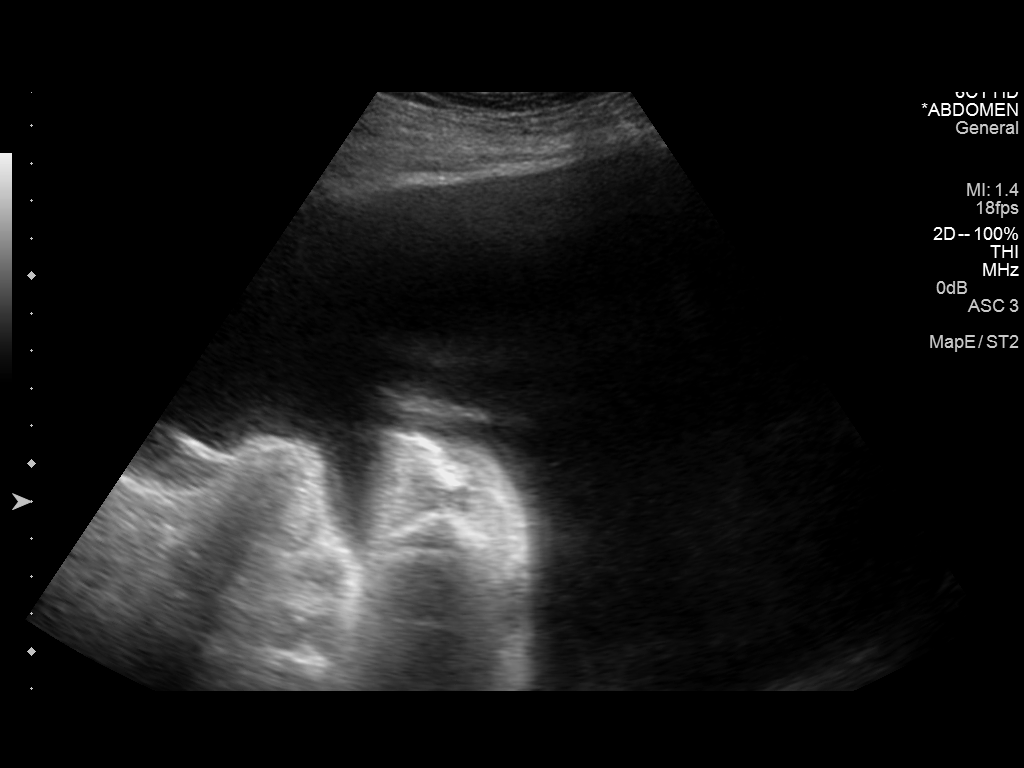
[im 3/6]
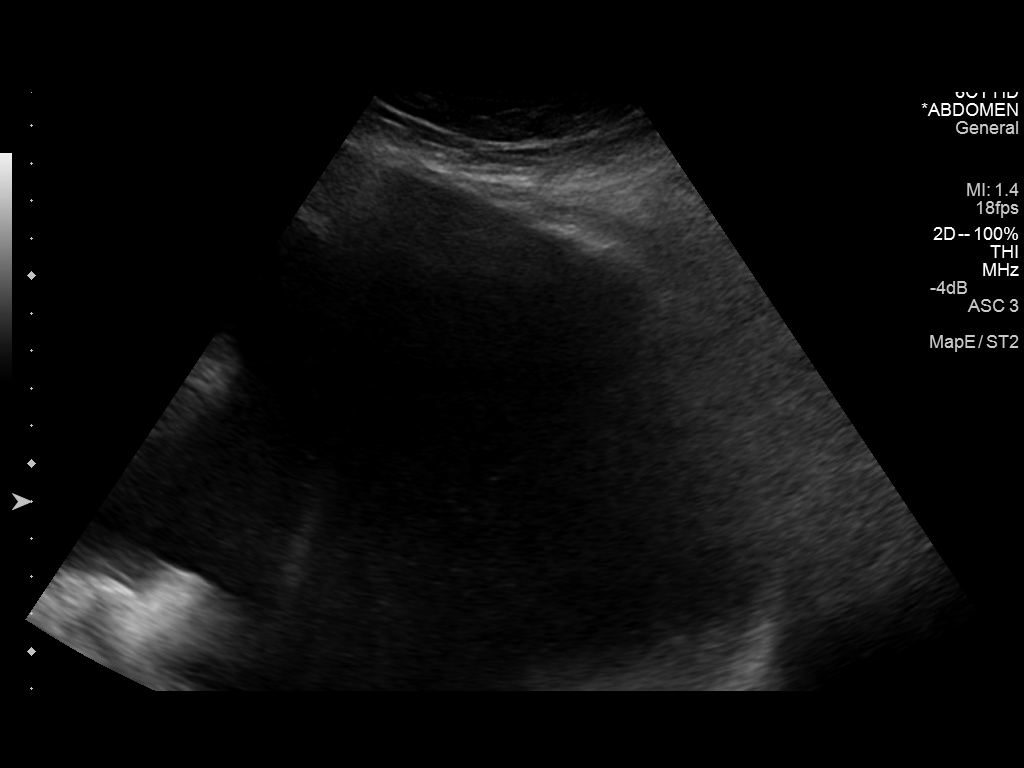
[im 4/6]
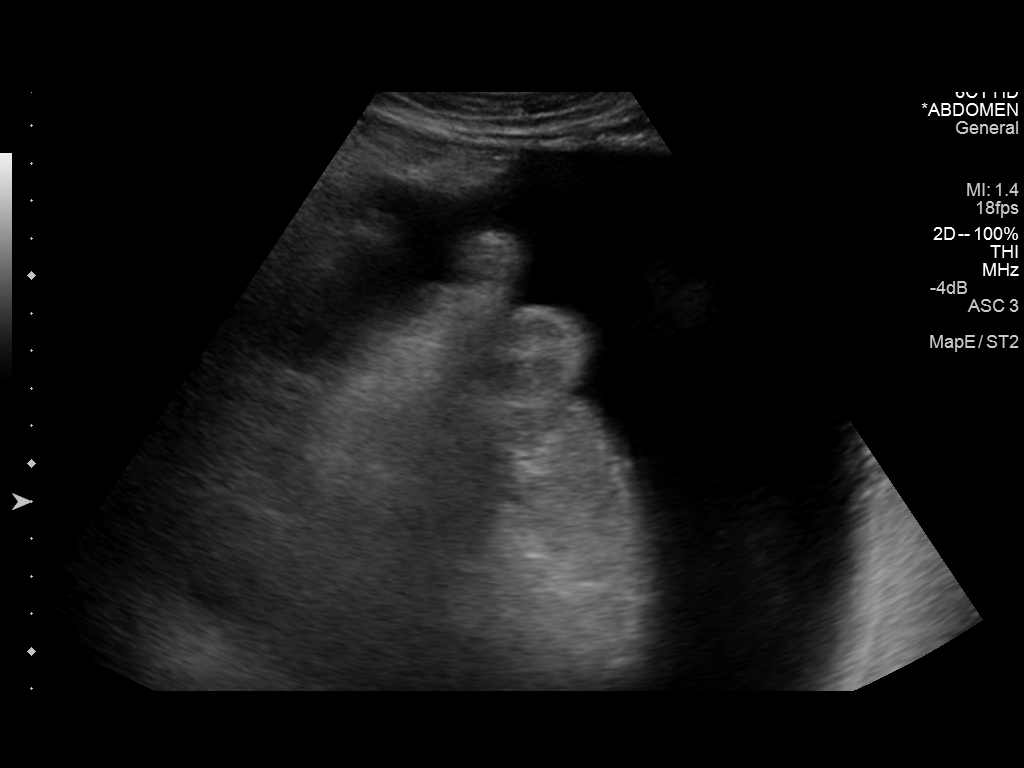
[im 5/6]
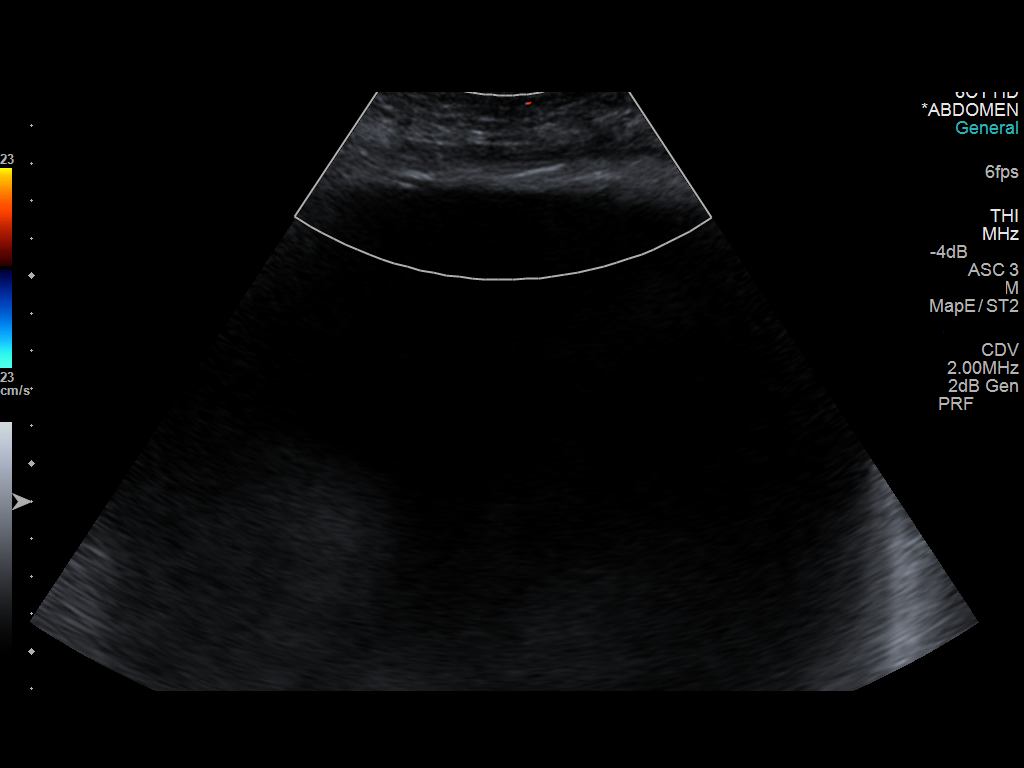
[im 6/6]
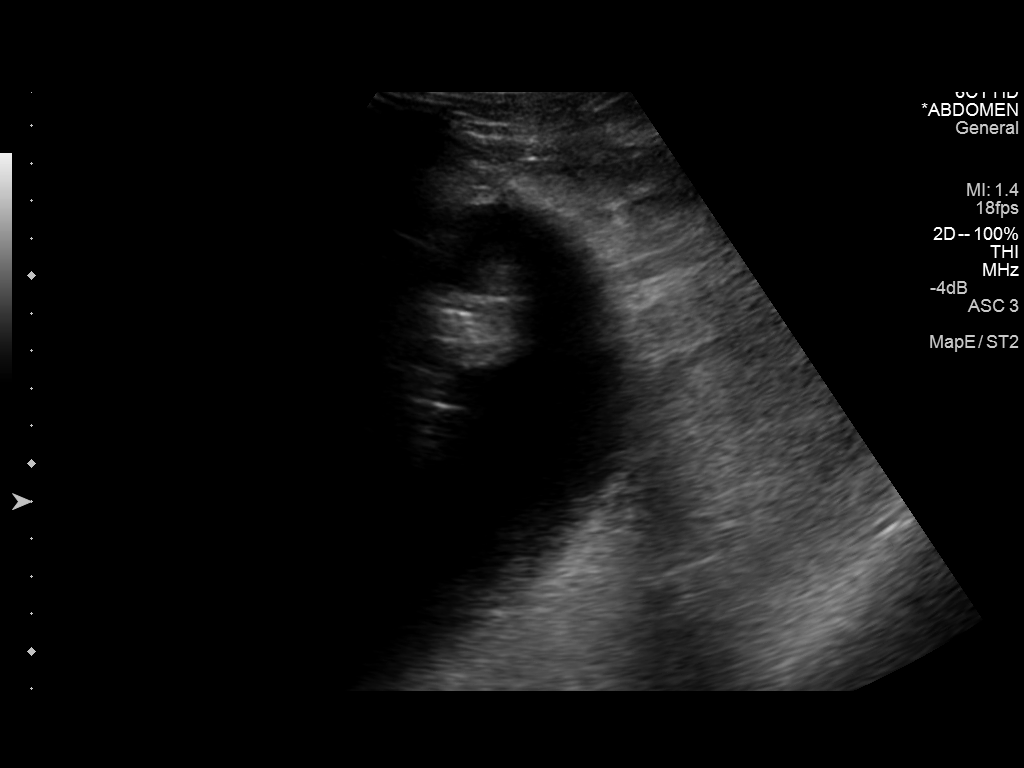

[6 of 6 positions shown; findings below may reference images not displayed]

Initial ultrasound scanning demonstrates a large amount of ascites
within the left lower abdominal quadrant. The left lower abdomen was
prepped and draped in the usual sterile fashion. 1% lidocaine was
used for local anesthesia. Under direct ultrasound guidance, a 19
gauge, 10-cm, Yueh catheter was introduced. An ultrasound image was
saved for documentation purposed. The paracentesis was performed.
The catheter was removed and a dressing was applied. The patient
tolerated the procedure well without immediate post procedural
complication.
FINDINGS: A total of approximately 6 liters of yellow fluid was removed.
Samples were sent to the laboratory as requested by the clinical
team.
IMPRESSION: Successful ultrasound-guided diagnostic and therapeutic paracentesis
yielding 6 liters of peritoneal fluid.

## 2017-02-15 IMAGING — CT CT ANGIO CHEST
2 of 6 series · 18 of 36 positions shown · IV contrast (OMNIPAQUE 350)
Comparison: 07/14/2015

CLINICAL DATA: Weakness. Hypotension. Last chemo on [REDACTED] for
Metastatic carcinoma involving liver with unknown primary site. Has
htn, dm.

EXAM:
CT ANGIOGRAPHY CHEST WITH CONTRAST
TECHNIQUE: Multidetector CT imaging of the chest was performed using the
standard protocol during bolus administration of intravenous
contrast. Multiplanar CT image reconstructions and MIPs were
obtained to evaluate the vascular anatomy.
CONTRAST:  100mL OMNIPAQUE IOHEXOL 350 MG/ML SOLN

[Series 5: coronal mpr · coronal · 0.42mm/px · 1 of 151 slices shown]
[im 76/151  mediastinal]
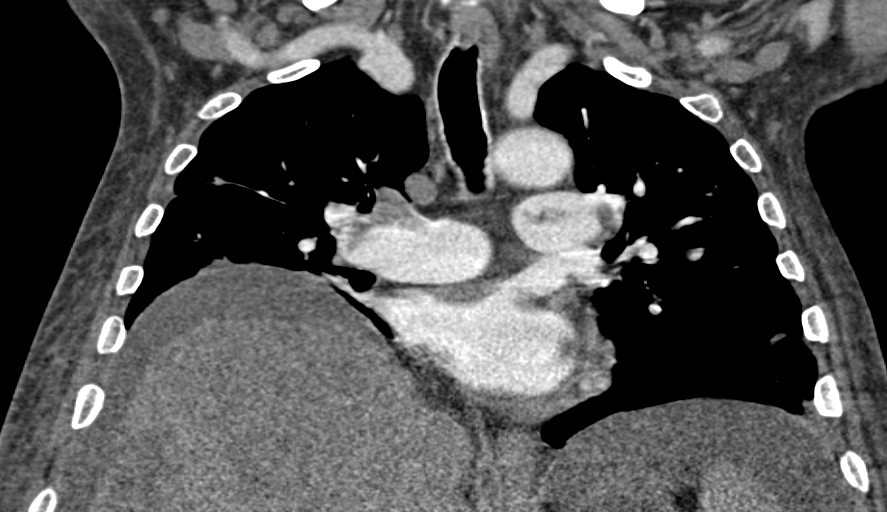

[Series 10: thins for pacs · axial · 0.74mm/px · z∈[-209,-13]mm · 17 of 218 slices shown]
[im 11/218  lung]
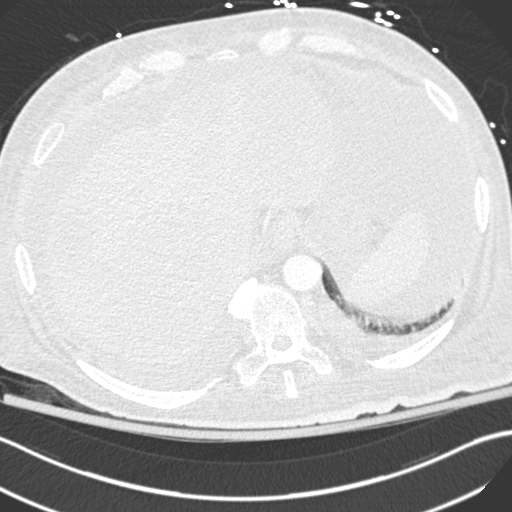
[im 22/218  mediastinal]
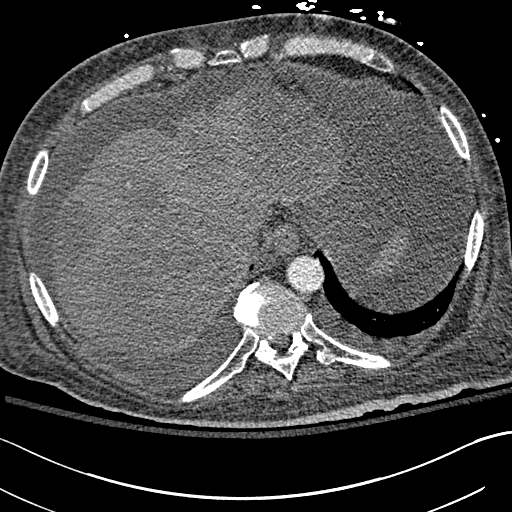
[im 33/218  lung]
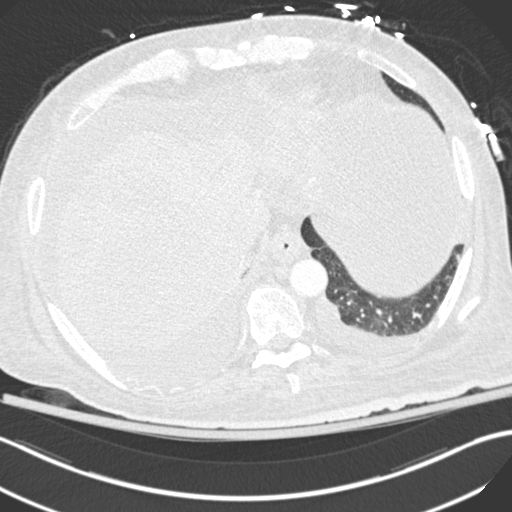
[im 44/218  mediastinal]
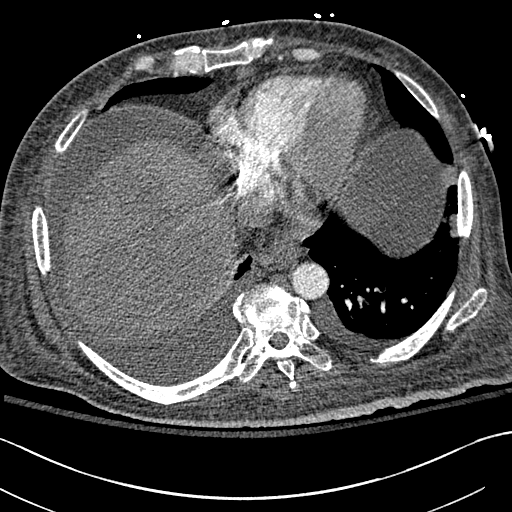
[im 66/218  lung]
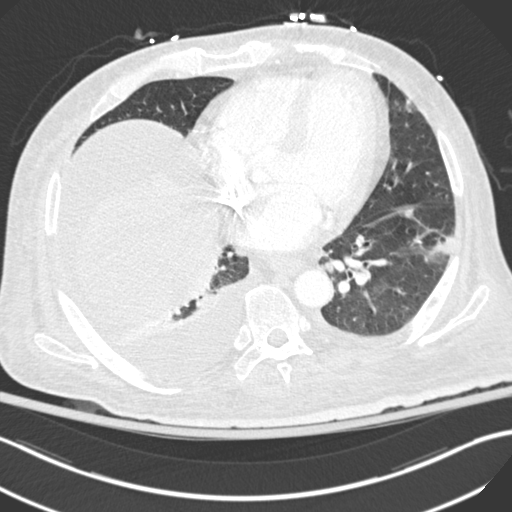
[im 76/218  mediastinal]
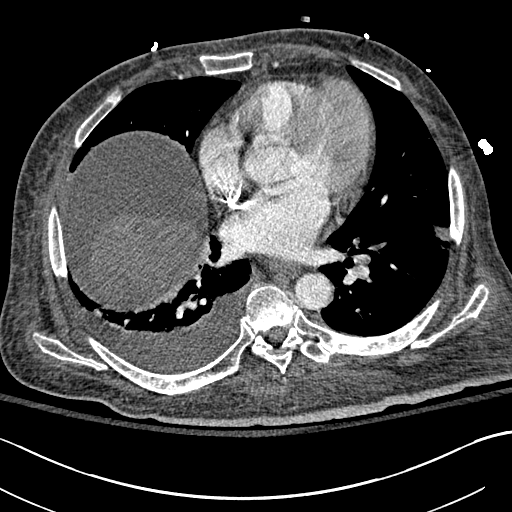
[im 87/218  lung]
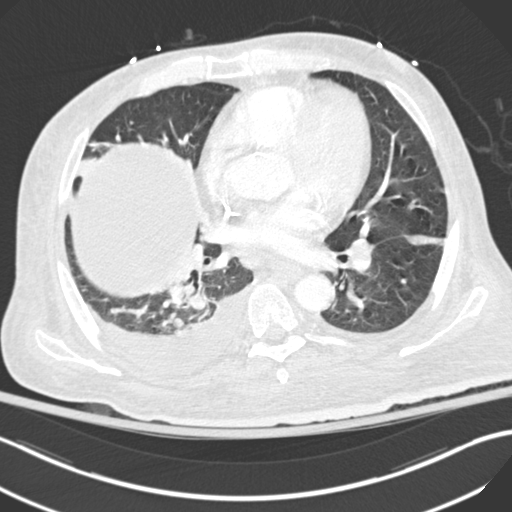
[im 98/218  mediastinal]
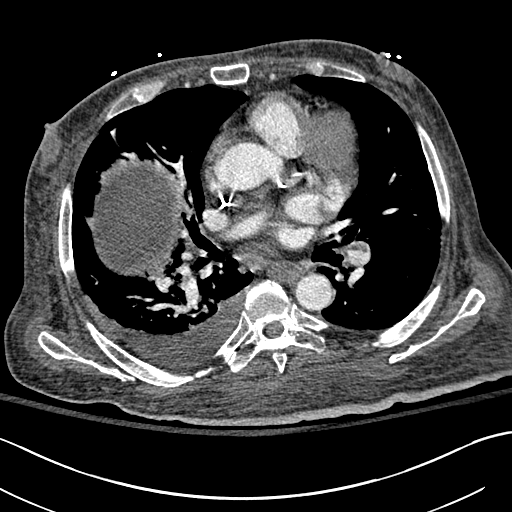
[im 109/218  lung]
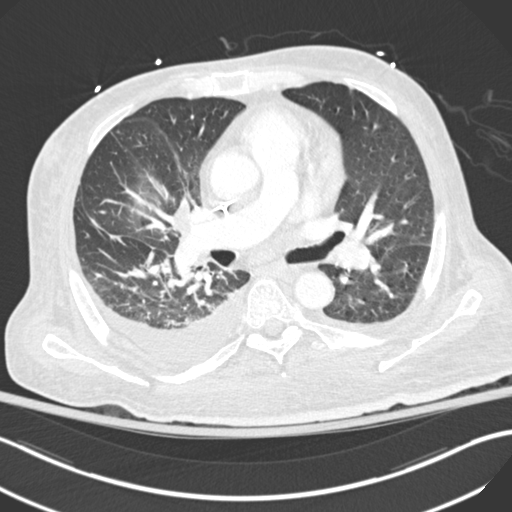
[im 120/218  mediastinal]
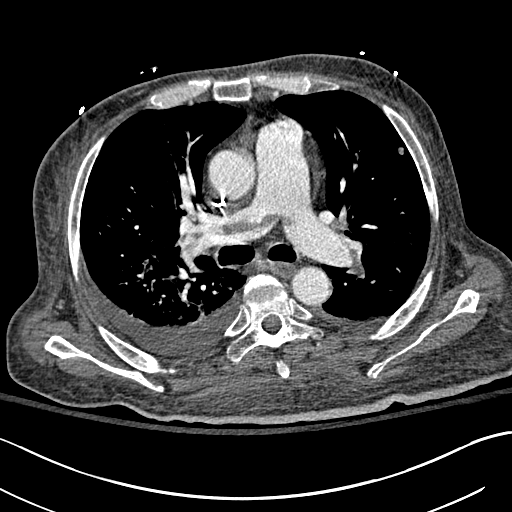
[im 131/218  lung]
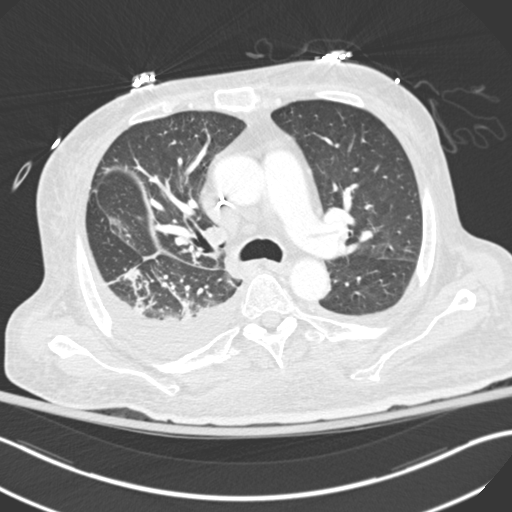
[im 142/218  mediastinal]
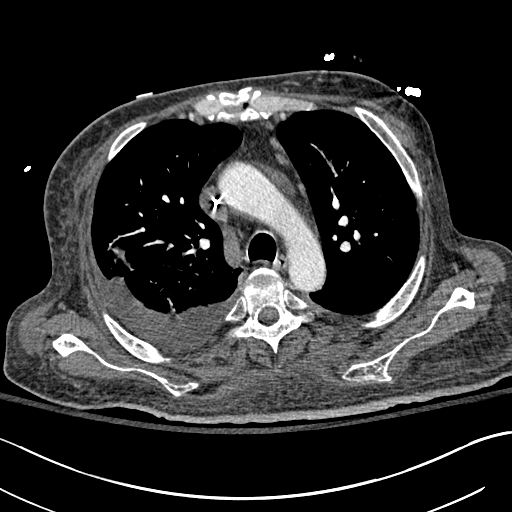
[im 152/218  lung]
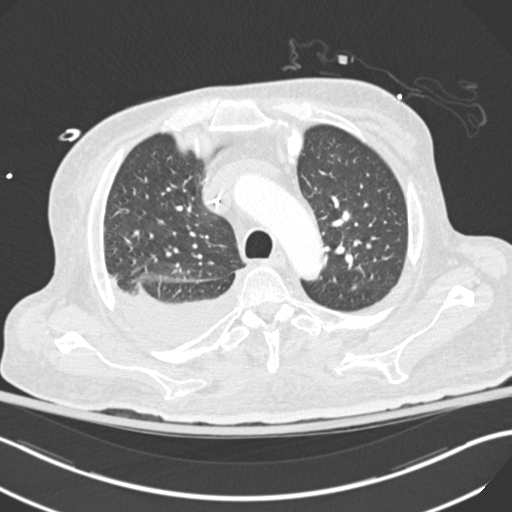
[im 174/218  mediastinal]
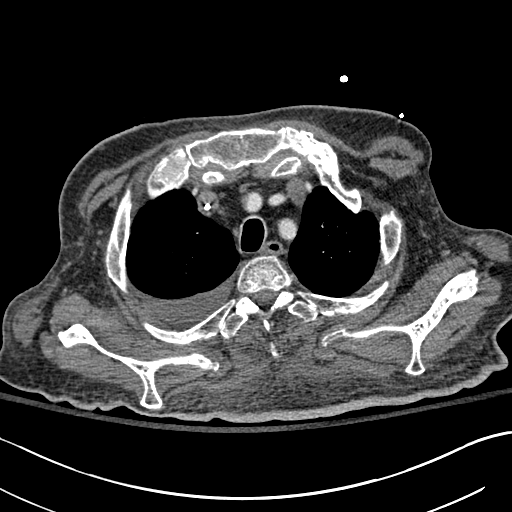
[im 185/218  lung]
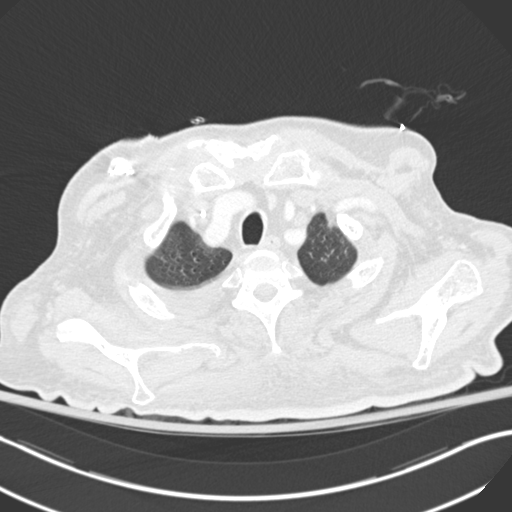
[im 196/218  mediastinal]
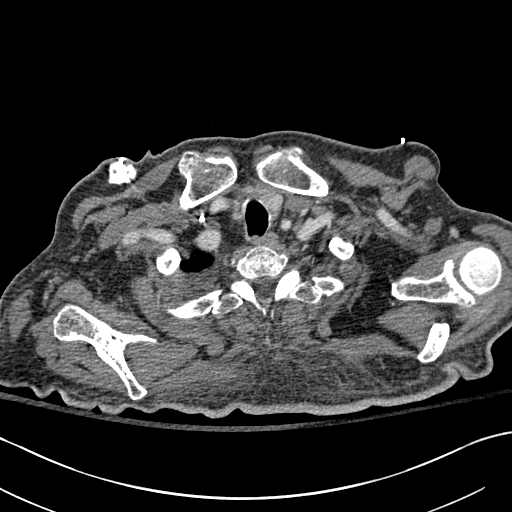
[im 207/218  lung]
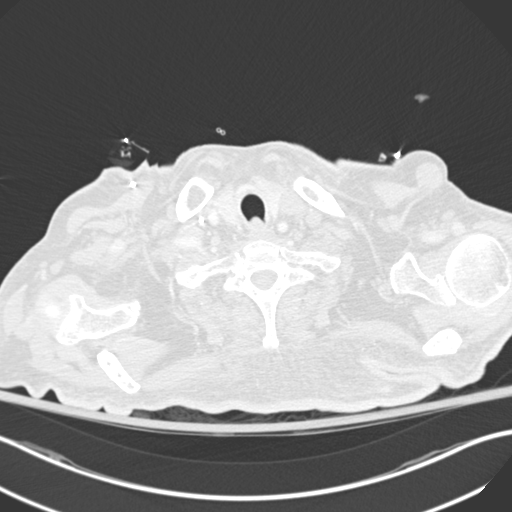

[18 of 36 positions shown; findings below may reference images not displayed]

FINDINGS: Contrast injection via right IJ power port. RV/LV ratio 1.05,
borderline increased. However, the RV is less dilated than seen on
prior study. There is a saddle embolus at the bifurcation of the
main pulmonary artery with central emboli in the right upper lobe
pulmonary artery and left lower lobe pulmonary artery branches, and
more peripheral segmental emboli in lingular, left upper lobe, and
right lower lobe branches.

Moderate right and tiny left pleural effusions.

Progressive sub carinal adenopathy. Subcentimeter precarinal node
and small anterior mediastinal nodes. No definite hilar adenopathy.

12 mm lingular pulmonary nodule. Multiple left lower lobe pulmonary
nodules, largest in the lateral basal segment measured and 16 mm,
increased from previous exam. Dependent atelectasis in the right
lower lobe, with several pulmonary nodules still conspicuous.
Multiple small pulmonary nodules in both upper lobes largely stable.
Thoracic spine and sternum intact. Multiple large hepatic
metastases. There is a large amount of abdominal ascites,
significantly increased from previous exam.

Review of the MIP images confirms the above findings.
IMPRESSION: 1. New central and segmental bilateral pulmonary emboli with
equivocal suggestion of right heart strain due to submassive
(intermediate risk) PE.
2. Large amount of abdominal ascites, significantly increased from
prior study.
3. Progression of pulmonary and mediastinal metastatic disease.

Critical Value/emergent results were called by telephone at the time
of interpretation on 10/02/2015 at [DATE] to Dr. OPA OPAPA CAUNE , who
verbally acknowledged these results.
